# Patient Record
Sex: Male | Born: 1950
Health system: Southern US, Community
[De-identification: ages and names within clinical notes are randomized; demographics above are authoritative.]

## PROBLEM LIST (undated history)

## (undated) DIAGNOSIS — Z9889 Other specified postprocedural states: Secondary | ICD-10-CM

## (undated) DIAGNOSIS — Z860100 Personal history of colon polyps, unspecified: Secondary | ICD-10-CM

## (undated) DIAGNOSIS — M1990 Unspecified osteoarthritis, unspecified site: Secondary | ICD-10-CM

## (undated) DIAGNOSIS — R55 Syncope and collapse: Secondary | ICD-10-CM

## (undated) DIAGNOSIS — I739 Peripheral vascular disease, unspecified: Secondary | ICD-10-CM

## (undated) DIAGNOSIS — E119 Type 2 diabetes mellitus without complications: Secondary | ICD-10-CM

## (undated) DIAGNOSIS — M109 Gout, unspecified: Secondary | ICD-10-CM

## (undated) DIAGNOSIS — I10 Essential (primary) hypertension: Secondary | ICD-10-CM

## (undated) DIAGNOSIS — R51 Headache: Secondary | ICD-10-CM

## (undated) DIAGNOSIS — N529 Male erectile dysfunction, unspecified: Secondary | ICD-10-CM

## (undated) DIAGNOSIS — T82898A Other specified complication of vascular prosthetic devices, implants and grafts, initial encounter: Secondary | ICD-10-CM

## (undated) DIAGNOSIS — E782 Mixed hyperlipidemia: Secondary | ICD-10-CM

## (undated) DIAGNOSIS — R319 Hematuria, unspecified: Secondary | ICD-10-CM

## (undated) DIAGNOSIS — I350 Nonrheumatic aortic (valve) stenosis: Secondary | ICD-10-CM

## (undated) DIAGNOSIS — R112 Nausea with vomiting, unspecified: Secondary | ICD-10-CM

## (undated) DIAGNOSIS — I2510 Atherosclerotic heart disease of native coronary artery without angina pectoris: Secondary | ICD-10-CM

## (undated) DIAGNOSIS — F329 Major depressive disorder, single episode, unspecified: Secondary | ICD-10-CM

## (undated) DIAGNOSIS — E669 Obesity, unspecified: Secondary | ICD-10-CM

## (undated) DIAGNOSIS — E785 Hyperlipidemia, unspecified: Secondary | ICD-10-CM

## (undated) DIAGNOSIS — K219 Gastro-esophageal reflux disease without esophagitis: Secondary | ICD-10-CM

## (undated) DIAGNOSIS — G43909 Migraine, unspecified, not intractable, without status migrainosus: Secondary | ICD-10-CM

## (undated) DIAGNOSIS — F32A Depression, unspecified: Secondary | ICD-10-CM

## (undated) DIAGNOSIS — Z8669 Personal history of other diseases of the nervous system and sense organs: Secondary | ICD-10-CM

## (undated) DIAGNOSIS — K760 Fatty (change of) liver, not elsewhere classified: Secondary | ICD-10-CM

## (undated) DIAGNOSIS — R011 Cardiac murmur, unspecified: Secondary | ICD-10-CM

## (undated) HISTORY — DX: Fatty (change of) liver, not elsewhere classified: K76.0

## (undated) HISTORY — DX: Depression, unspecified: F32.A

## (undated) HISTORY — DX: Obesity, unspecified: E66.9

## (undated) HISTORY — DX: Cardiac murmur, unspecified: R01.1

## (undated) HISTORY — PX: CARDIAC CATHETERIZATION: SHX172

## (undated) HISTORY — DX: Peripheral vascular disease, unspecified: I73.9

## (undated) HISTORY — DX: Nonrheumatic aortic (valve) stenosis: I35.0

## (undated) HISTORY — PX: US ECHOCARDIOGRAPHY: HXRAD669

## (undated) HISTORY — DX: Essential (primary) hypertension: I10

## (undated) HISTORY — DX: Male erectile dysfunction, unspecified: N52.9

## (undated) HISTORY — DX: Hematuria, unspecified: R31.9

## (undated) HISTORY — DX: Syncope and collapse: R55

## (undated) HISTORY — PX: FEMORAL ARTERY - POPLITEAL ARTERY BYPASS GRAFT: SUR180

## (undated) HISTORY — PX: ROTATOR CUFF REPAIR: SHX139

## (undated) HISTORY — DX: Personal history of colon polyps, unspecified: Z86.0100

## (undated) HISTORY — DX: Migraine, unspecified, not intractable, without status migrainosus: G43.909

## (undated) HISTORY — DX: Personal history of colonic polyps: Z86.010

## (undated) HISTORY — DX: Gout, unspecified: M10.9

## (undated) HISTORY — DX: Personal history of other diseases of the nervous system and sense organs: Z86.69

## (undated) HISTORY — DX: Hyperlipidemia, unspecified: E78.5

## (undated) HISTORY — DX: Major depressive disorder, single episode, unspecified: F32.9

## (undated) HISTORY — DX: Mixed hyperlipidemia: E78.2

## (undated) HISTORY — PX: MRI: SHX5353

## (undated) HISTORY — DX: Other specified complication of vascular prosthetic devices, implants and grafts, initial encounter: T82.898A

## (undated) HISTORY — DX: Type 2 diabetes mellitus without complications: E11.9

## (undated) HISTORY — PX: KNEE ARTHROSCOPY: SUR90

---

## 1995-08-01 DIAGNOSIS — T82898A Other specified complication of vascular prosthetic devices, implants and grafts, initial encounter: Secondary | ICD-10-CM

## 1995-08-01 HISTORY — DX: Other specified complication of vascular prosthetic devices, implants and grafts, initial encounter: T82.898A

## 1999-01-04 ENCOUNTER — Encounter: Payer: Self-pay | Admitting: Family Medicine

## 1999-01-04 ENCOUNTER — Ambulatory Visit (HOSPITAL_COMMUNITY): Admission: RE | Admit: 1999-01-04 | Discharge: 1999-01-04 | Payer: Self-pay | Admitting: Family Medicine

## 2002-03-19 ENCOUNTER — Encounter: Payer: Self-pay | Admitting: *Deleted

## 2002-03-19 ENCOUNTER — Ambulatory Visit (HOSPITAL_COMMUNITY): Admission: RE | Admit: 2002-03-19 | Discharge: 2002-03-19 | Payer: Self-pay | Admitting: *Deleted

## 2005-01-03 ENCOUNTER — Encounter (INDEPENDENT_AMBULATORY_CARE_PROVIDER_SITE_OTHER): Payer: Self-pay | Admitting: *Deleted

## 2005-01-03 ENCOUNTER — Ambulatory Visit (HOSPITAL_COMMUNITY): Admission: RE | Admit: 2005-01-03 | Discharge: 2005-01-03 | Payer: Self-pay | Admitting: *Deleted

## 2006-11-15 ENCOUNTER — Emergency Department (HOSPITAL_COMMUNITY): Admission: EM | Admit: 2006-11-15 | Discharge: 2006-11-16 | Payer: Self-pay | Admitting: Emergency Medicine

## 2006-11-18 ENCOUNTER — Ambulatory Visit (HOSPITAL_COMMUNITY): Admission: EM | Admit: 2006-11-18 | Discharge: 2006-11-18 | Payer: Self-pay | Admitting: *Deleted

## 2007-05-09 ENCOUNTER — Ambulatory Visit: Payer: Self-pay | Admitting: Vascular Surgery

## 2007-05-27 ENCOUNTER — Ambulatory Visit: Payer: Self-pay | Admitting: Surgery

## 2007-08-01 DIAGNOSIS — I350 Nonrheumatic aortic (valve) stenosis: Secondary | ICD-10-CM

## 2007-08-01 HISTORY — DX: Nonrheumatic aortic (valve) stenosis: I35.0

## 2007-08-05 ENCOUNTER — Ambulatory Visit: Payer: Self-pay | Admitting: Vascular Surgery

## 2008-07-06 ENCOUNTER — Inpatient Hospital Stay (HOSPITAL_COMMUNITY): Admission: EM | Admit: 2008-07-06 | Discharge: 2008-07-08 | Payer: Self-pay | Admitting: Emergency Medicine

## 2008-07-07 ENCOUNTER — Encounter (INDEPENDENT_AMBULATORY_CARE_PROVIDER_SITE_OTHER): Payer: Self-pay | Admitting: Internal Medicine

## 2008-07-08 ENCOUNTER — Other Ambulatory Visit: Payer: Self-pay | Admitting: Interventional Cardiology

## 2008-08-04 ENCOUNTER — Ambulatory Visit: Payer: Self-pay | Admitting: Vascular Surgery

## 2010-03-25 ENCOUNTER — Encounter: Admission: RE | Admit: 2010-03-25 | Discharge: 2010-03-25 | Payer: Self-pay | Admitting: Family Medicine

## 2010-12-13 NOTE — Assessment & Plan Note (Signed)
OFFICE VISIT   RONDELL, PARDON E  DOB:  [birth year]                                       05/27/2007  CHART#:[id number]   REASON FOR VISIT:  Follow up ultrasound.   HISTORY:  This is now a 60 year old gentleman who is status post right  superficial femoral artery to below-knee popliteal bypass with reverse  greater saphenous vein secondary to right popliteal occlusion and severe  claudication.  This was performed on January 16, 1996.  The patient on  October 9, underwent duplex evaluation which revealed a decrease in his  ankle brachial indices to 0.58.  This also shows an occluded bypass  graft.  The patient reports that he cannot specifically pinpoint a time  where he began having difficulty.  However, a few months ago while at  the beach he fell and has had trouble since.  He states that he can walk  approximately 1/4 to a 1/2 mile before he has cramps.  He then rests and  the cramps go away and he is able to continue walking.  He denies having  any rest pain.  He does not have any open ulcers.   PAST MEDICAL HISTORY:  Significant for:  1. High blood pressure.  2. Hypercholesterolemia.   FAMILY HISTORY:  Negative for cardiovascular disease.   SOCIAL HISTORY:  He is married.  He does not smoke.  He has never  smoked.  He does not drink.   REVIEW OF SYSTEMS:  GENERAL:  Negative for weight loss or weight gain.  CARDIAC:  Negative for chest pain.  Positive for heart murmur as a young  person.  PULMONARY:  Negative for cough or bronchitis.  GI:  Negative.  GU:  Negative.  NEURO:  Negative.  PSYCHIATRIC:  Negative.  ENT:  Negative.  HEME:  Negative.   MEDICATIONS:  Include:  1. Allopurinol 100 mg once daily.  2. Prevacid 30 mg as needed.  3. Colchicine 0.6 mg two times per day.  4. Benazepril 20 mg once daily.   PHYSICAL EXAMINATION:  Blood pressure is 160/98, pulse 60, respirations  16.  Generally:  He is well-appearing in no acute distress.  Cardiovascular:  Regular.  Lungs are clear.  Respirations are non-  labored.  Abdomen is soft.  Extremities:  The right leg is cool to  touch.  There are no palpable pulses.  There is no ulceration.  The  patient has positive sensation between his first and second web spaces.  He is able to dorsi-flex his toe.   DIAGNOSTIC STUDIES:  Duplex exam:  The patient has an occluded bypass  graft.  Ankle brachial indices are 0.58 on the right.   ASSESSMENT/PLAN:  Status post right superficial femoral artery to  popliteal bypass graft which is now occluded.   PLAN:  At this point in time the patient is not having any rest pain and  his claudication occurs at approximately 1/4 to 1/2 mile.  We discussed  options of proceeding with an arteriogram versus observation.  At this  time based on his symptoms I would probably delay performing a second  bypass and have him schedule to follow up with Quita Skye. Hart Rochester, M.D. in  three months for reevaluation.  The patient will contact us if he has  worsening claudication or rest pain or develops any non-healing wounds.  Jorge Ny, MD  Electronically Signed   VWB/MEDQ  D:  05/27/2007  T:  05/28/2007  Job:  [id number]

## 2010-12-13 NOTE — Discharge Summary (Signed)
[name], [name]              ACCOUNT NO.:  [id number]   MEDICAL RECORD NO.:  [id number]          PATIENT TYPE:  INP   LOCATION:  1407                         FACILITY:  [facility]   PHYSICIAN:  Corinna L. Lendell Caprice, MDDATE OF BIRTH:  Jul 03, 1951   DATE OF ADMISSION:  07/06/2008  DATE OF DISCHARGE:  07/08/2008                               DISCHARGE SUMMARY   DISCHARGE DIAGNOSES:  1. Dyspnea.  2. Hyperlipidemia.  3. Hypertension.  4. Moderate aortic stenosis.  5. Mildly decreased left ventricular function.  6. Nonobstructive coronary artery disease.   DISCHARGE MEDICATIONS:  1. Imdur 30 mg a day.  2. Zocor 20 mg a day.  3. Enalapril 5 mg a day.  4. Aspirin 81 mg a day.  5. He may continue his Cymbalta 60 mg a day and Prevacid OTC as      needed.   CONDITION:  Stable.   ACTIVITY:  He may return to work next week.   CONSULTATIONS:  [facility].   PROCEDURES:  Cardiac catheterization.   DIET:  Low-salt, low-cholesterol.   LABORATORY DATA:  CBC unremarkable.  PT/PTT normal.  D-dimer 0.55.  Basic metabolic panel unremarkable.  Liver function tests significant  for a total bilirubin of 1.4, otherwise unremarkable.  Hemoglobin A1c is  6.2.  Total CPK is 568.  Troponin 0.02.  BNP 42.  Cholesterol 280,  triglycerides 155, HDL 35, LDL 214, VLDL 31.  TSH 1.243.   SPECIAL STUDIES/RADIOLOGY:  EKG showed sinus bradycardia with a rate of  58, inferolaterally flipped T-wave which is new from previous.  Chest x-  ray shows nothing acute.  CT angiogram of the chest showed no pulmonary  embolus, interlobular septal thickening and scattered areas of ground-  glass attenuation most consistent with volume overload and congestive  heart failure possibly related to aortic valvular disease, tiny  bilateral pleural effusions.  Followup chest CT recommended in 3-6  months, fatty liver.  Follow up with Dr. Eldridge Dace in 1-2 weeks.  Follow  up with Holley Bouche in 2-3 weeks.  He will need his  blood pressure  monitored, repeat LFTs, statin therapy and blood pressure check.  He  will need a repeat CAT scan in 3-6 months to follow up on the results.   HISTORY AND HOSPITAL COURSE:  [name] is a 60 year old white male  patient of Dr. Tiburcio Pea who presented with worsening dyspnea on exertion.  He had flipped T-waves inferolaterally.  CAT scan showed above.  No PE.  The patient was given Lasix.  His cardiac enzymes were cycled.  He had  negative troponins.  Cardiology was consulted and performed cardiac  catheterization.  Please see report.  He had been on blood pressure and  lipid lowering medications previously but took himself off this.  Dr.  Eldridge Dace recommended ACE inhibitor, statin and Imdur.  I also  recommended aspirin a day.  If he continues to have dyspnea on exertion,  consider pulmonary workup.      Corinna L. Lendell Caprice, MD  Electronically Signed     CLS/MEDQ  D:  07/08/2008  T:  07/09/2008  Job:  [id number]   cc:   Corky Crafts, MD  Holley Bouche, M.D.

## 2010-12-13 NOTE — H&P (Signed)
[name], [name]              ACCOUNT NO.:  [id number]   MEDICAL RECORD NO.:  [id number]          PATIENT TYPE:  INP   LOCATION:  1407                         FACILITY:  [facility]   PHYSICIAN:  Michiel Cowboy, MDDATE OF BIRTH:  1950/10/25   DATE OF ADMISSION:  07/06/2008  DATE OF DISCHARGE:                              HISTORY & PHYSICAL   PRIMARY CARE Cristie Mckinney:  Dr. Tiburcio Pea.   CHIEF COMPLAINT:  Shortness of breath.   Patient is a 60 year old gentleman who stopped taking his blood pressure  medications and over the past one week has been complaining of  progressive shortness of breath when he does chores.  Particularly on  December 7 in the morning, when he was trying to take the garbage out,  once he stopped, the chest discomfort and shortness of breath goes away.  It is purely exertional and does not occur at rest but has been going on  for 4 weeks now.  Eventually, he let his wife know, and she brought him  to the emergency department, where he was noted to have EKG with T wave  inversion, so no old EKG was available, at which point, he was admitted  for further investigation.  [facility] were called for  admission.   REVIEW OF SYSTEMS:  No fevers, no chills, no nausea, no vomiting, no  diaphoresis, no constipation, no diarrhea.  The rest of the review of  systems are unremarkable.   PAST MEDICAL HISTORY:  Significant for hyperlipidemia and hypertension  and depression.  He actually stopped his hypertension and hyperlipidemia  medications.  He himself has also a past medical history of peripheral  vascular disease.   FAMILY HISTORY:  He has a history of peripheral vascular disease in his  mother.   SOCIAL HISTORY:  Patient never smoked.  He does not drink alcohol.  He  lives at home with his wife.   ALLERGIES:  No known drug allergies.   MEDICATIONS:  Prevacid.  Cymbalta.  He stopped his hypertension  medications.   PHYSICAL EXAMINATION:  VITALS:   Temperature 97.7, blood pressure 158/90,  respirations 18, satting at 98% on room air.  Heart rate 77.  Patient is alert and oriented.  No acute distress.  Appears older than  stated age.  Head nontraumatic.  Moist mucous membranes.  LUNGS:  Clear to auscultation bilaterally.  Somewhat distant breath  sounds, although occasional mild crackles at the bases.  HEART:  Regular rate and rhythm with no murmurs, rubs or gallops  appreciated.  ABDOMEN:  Soft, nontender, nondistended.  Slightly obese.  LOWER EXTREMITIES:  No clubbing, cyanosis or edema.  Cranial nerves II-XII are intact.  Strength is 5/5 in all 4 extremities.   LABS:  White blood cell count 8.6, hemoglobin 15.5.  D-dimer 0.55.  Sodium 141, potassium 3.5, creatinine 0.9.  CK 568.  MB 6.6 with index  of 1.8.  Troponin 0.02.  BNP 42.   EKG showing T wave inversion to leads I, II, V4-6.   Chest x-ray showing no cardiopulmonary disease.   CT of the chest shows no PE and shows  slight congestion.   ASSESSMENT/PLAN:  1. This is a 60 year old gentleman with a history of worsening      shortness of breath over the past week worrisome for angina.  We      will admit for further observation.  Cycle cardiac enzymes.      Discussed with cardiology, who will see him in the a.m. Will order      a 2D echo, given abnormal CT scans worrisome for infectious emboli.      Will give gentle Lasix, proBNP  and orthostatics.  D-dimer was      slightly elevated, but CT scan did not show pulmonary embolus.  2. Low potassium:  Will replace.  3. History of hypertension:  Unsure which medication he is supposed to      be on but will start him on metoprolol and titrate up.  4. History of hyperlipidemia:  Will obtain fasting lipid panel and      will likely need to start him on a statin.  Admit to telemetry.  5. Prophylaxis:  Protonix plus Lovenox.      Michiel Cowboy, MD  Electronically Signed     AVD/MEDQ  D:  07/07/2008  T:  07/07/2008   Job:  [id number]   cc:   Dr. Tiburcio Pea

## 2010-12-13 NOTE — Cardiovascular Report (Signed)
[name], [name]              ACCOUNT NO.:  [id number]   MEDICAL RECORD NO.:  [id number]          PATIENT TYPE:  INP   LOCATION:  3736                         FACILITY:  [facility]   PHYSICIAN:  Corky Crafts, MDDATE OF BIRTH:  Jun 25, 1951   DATE OF PROCEDURE:  07/08/2008  DATE OF DISCHARGE:  07/08/2008                            CARDIAC CATHETERIZATION   REFERRING PHYSICIAN:  Holley Bouche, MD   PROCEDURES PERFORMED:  1. Left heart catheterization.  2. Right heart catheterization.  3. Left ventriculogram.  4. Coronary angiogram.  5. Abdominal aortogram.   OPERATOR:  Corky Crafts, MD   INDICATIONS:  Shortness of breath, aortic stenosis.   PROCEDURE NARRATIVE:  The risks and benefits of cardiac catheterization  were explained to the patient and informed consent was obtained.  He was  brought to the Cath Lab and placed on the table.  He was prepped and  draped in the usual sterile fashion.  His left groin was infiltrated  with 1% lidocaine.  A 6-French sheath was placed into the left femoral  artery using a modified Seldinger technique.  Subsequently, a 7-French  sheath was placed into the left femoral vein using a modified Seldinger  technique.  A Swan-Ganz catheter was advanced to the pulmonary artery  under fluoroscopic guidance.  Hemodynamic measurements and oxygen  saturations were measured at that point.  A pigtail catheter was placed  into the ascending aorta and across the valve under fluoroscopic  guidance with some difficulty.  A straight wire had to be used with a  pigtail catheter to finally cross the aortic valve.  The PA catheter was  removed, and pressures were obtained in the RV and right atrium.  A  power injection of contrast was performed using a pigtail catheter to  image the left ventricle.  A pullback was performed under continuous  hemodynamic pressure monitoring.  Catheter was then pulled back to the  abdominal aorta, and a power injection  of contrast was performed to  image the infrarenal aorta.  A JL-4.0 catheter was then advanced to the  ascending aorta into the left main coronary artery.  Digital angiography  was performed in multiple projections using hand injection of contrast.  A JR-4 catheter was then used to engage the ostium of the right coronary  artery under fluoroscopic guidance.  Digital angiography was performed  in multiple projections using hand injection of contrast.  The sheaths  will be removed using manual compression.   FINDINGS:  Right heart cath.  Right atrial pressure was 6/4 mmHg with a  mean right atrial pressure of 3.  RV pressure was 31/3 with an RVEDP of  5 mmHg.  Pulmonary artery saturation was 63%.  Cardiac output calculated  by Fick was 5.5 with a cardiac index of 2.5.  Left ventricular pressure  was 134/7 with an LVEDP of 16 mmHg.  Aortic pressure of 102/68 with a  mean aortic pressure of 83 mmHg.  The mean gradient was 22 mmHg.  The  calculated aortic valve area is 1.45 sq. cm.  The estimated left  ventricular ejection fraction from the  ventriculogram was 40-45%.  The  abdominal aortogram showed no abdominal aortic aneurysm.  There are  bilateral single renal arteries, which were widely patent.  The left  main was widely patent.  The left circumflex was initially a medium-  sized vessel, which appeared to have a significant lesion in the  proximal segment.  After intracoronary nitroglycerin was given, the size  of the circumflex increased dramatically.  The proximal ostial stenosis  appeared to be about 50-60%.  There is an OM-1, which was small.  The OM-  2 was large and had mild irregularities.   The left anterior descending was a large vessel, which reached the apex.  There was a large first diagonal, which was widely patent.   The right coronary artery was a large dominant vessel with moderate  diffuse atherosclerosis.  There was calcium noted distally.  There was a  dual PDA, both  of which were widely patent.   IMPRESSION:  1. Moderate aortic stenosis with an aortic valve area of 1.45 sq. cm      calculated by the Gorlin formula.  2. Mildly decreased left ventricular function by ventriculogram with      an EF of 40-45%.  3. No abdominal aortic aneurysm.  4. No pulmonary hypertension with pulmonary artery pressure of 22/8,      with a mean PA of 14.  Pulmonary capillary wedge pressure was 9/7      with a mean wedge of 7 mmHg.  5. Mild to moderate copronary artery disease requiring no      revascularization at this time.   RECOMMENDATIONS:  Continue aggressive blood pressure control.  We will  start an ACE inhibitor and statins given his moderate atherosclerosis as  well as aortic stenosis.  The patient will also need aggressive  lifestyle modifications effecting his diet and exercise.      Corky Crafts, MD  Electronically Signed     JSV/MEDQ  D:  07/08/2008  T:  07/08/2008  Job:  [id number]

## 2010-12-13 NOTE — Procedures (Signed)
BYPASS GRAFT EVALUATION   INDICATION:  Follow-up evaluation of right leg bypass graft.  Recent  onset, sharp pain in right knee and right calf claudication within the  last month.   HISTORY:  Diabetes:  No.  Cardiac:  No.  Hypertension:  Yes.  Smoking:  No.  Previous Surgery:  Right superficial femoral to popliteal artery bypass  graft on January 16, 1996 by Dr. Hart Rochester.   SINGLE LEVEL ARTERIAL EXAM                               RIGHT              LEFT  Brachial:                    120                126  Anterior tibial:             56                 80  Posterior tibial:            74                 126  Peroneal:  Ankle/brachial index:        0.58               1.0   PREVIOUS ABI:  Date: 07/22/1997  RIGHT:  1.0  LEFT:  >1.0   LOWER EXTREMITY BYPASS GRAFT DUPLEX EXAM:   DUPLEX:  Doppler arterial waveforms are triphasic in the proximal to mid  right superficial femoral artery.  No flow is detected within the right  superficial femoral artery to distal popliteal artery graft.  Large  collateral vessels are seen proximal to the occluded superficial femoral  to popliteal artery graft, originating off of the superficial femoral  artery.   IMPRESSION:  1. Right ankle brachial indices lower than previously recorded.  2. Left ankle brachial index is stablefrom previous study.  3. Occluded right superficial femoral to distal popliteal artery      bypass graft.   ___________________________________________  Quita Skye. Hart Rochester, M.D.   MC/MEDQ  D:  05/09/2007  T:  05/10/2007  Job:  [id number]

## 2010-12-13 NOTE — Assessment & Plan Note (Signed)
OFFICE VISIT   [name], [name]  DOB:  July 13, 1951                                       08/05/2007  CHART#:[id number]   The patient returns today having been seen by Dr. Myra Gianotti a few months  ago and found to have an occluded right superficial femoral to popliteal  bypass graft, which I inserted in 1997.  It sounds as if the graft  occluded last summer in 2008.  He currently has stable calf claudication  symptoms.  He is able to ambulate 2.8 miles, at which point he stops  walking, not out of necessity.  He does eventually develop numbness in  toes 3 through 5 on the lateral aspect of the foot, and his symptoms  begin at about 1 mile.  He is able to perform his daily activities at  the present time.  He has no symptoms in the left leg.  He denies any  hemispheric anomalies, hemispheric TIAs, amaurosis fugax, diplopia,  blurred vision, or syncope, and also denies any chest pain, dyspnea on  exertion, PND, orthopnea.   EXAM:  Blood pressure 132/90, heart rate 80, respirations 14.  In  general, he is a healthy-appearing middle-aged male in no apparent  distress.  Carotid pulses 3+.  No audible bruits.  Chest is clear to  auscultation.  Abdomen is soft and nontender with no palpable masses.  Right leg has a 3+ femoral with absent popliteal and distal pulses.  Left leg has 3+ femoral, popliteal, or dorsalis pedis and posterior  tibial pulses.  ABI today is 0.67 on the right and 0.99 on the left.   I have encouraged him to continue to increase his activity as tolerated  and, hopefully, his collaterals will improve.  Unless his symptoms  worsen, I do not think we will need to re-intervene, but we will see him  back in 1 year with followup ABI at that time.   Quita Skye Hart Rochester, M.D.  Electronically Signed   JDL/MEDQ  D:  08/05/2007  T:  08/06/2007  Job:  [id number]

## 2010-12-13 NOTE — Assessment & Plan Note (Signed)
OFFICE VISIT   [name], [name]  DOB:  1950-10-07                                       08/04/2008  CHART#:[id number]   The patient returns today for continued followup regarding his lower  extremity occlusive disease.  He had previously undergone a right  superficial femoral to popliteal bypass graft by me in 1997 which  occluded about a year or so ago.  His circulation has been stable since  that time.  He develops calf claudication at about 1/2 mile in the right  calf only but is able to continue and walks up to 2-1/2 miles at a time.  His foot becomes numb but he is able to continue.  He has had no rest  pain or nonhealing ulcers or infection.  He has no symptoms in the  contralateral left leg.  Denies any neurologic symptoms such as  hemiparesis, aphasia, amaurosis fugax, diplopia, blurred vision, or  syncope.  He did have some shortness of breath a few months ago and  underwent cardiac catheterization by [facility] and was not found  to have any significant coronary artery disease.  He takes 1 aspirin per  day.   PHYSICAL EXAM:  Blood pressure is 125/83.  Heart rate is 80.  Respirations 14.  Carotid pulses 3+, no audible bruits.  Neurologic  Exam:  Normal.  Chest:  Clear to auscultation.  Abdomen:  Soft,  nontender with no masses.  He has 3+ femoral, popliteal, and posterior  tibial pulses on the left and 3+ femoral pulse on the right.   ABI is 0.67 on the right and 0.95 on the left, unchanged from January of  2009.   I reassured him regarding these findings.  He will contact us if the  symptoms worsen but otherwise will not need to be followed on a regular  basis as he is well aware of symptoms of progressive ischemia.   Quita Skye Hart Rochester, M.D.  Electronically Signed   JDL/MEDQ  D:  08/04/2008  T:  08/05/2008  Job:  [id number]   cc:   Holley Bouche, M.D.

## 2010-12-16 NOTE — Consult Note (Signed)
[name], [name]              ACCOUNT NO.:  [id number]   MEDICAL RECORD NO.:  [id number]          PATIENT TYPE:  EMS   LOCATION:  ED                           FACILITY:  [facility]   PHYSICIAN:  Lindaann Slough, M.D.  DATE OF BIRTH:  [birth year]   DATE OF CONSULTATION:  11/18/2006  DATE OF DISCHARGE:                                 CONSULTATION   REASON FOR CONSULTATION:  Left flank pain.   HISTORY OF PRESENT ILLNESS:  The patient is a 60 year old male who was  seen in the emergency room on Thursday for severe, left-sided abdominal  pain associated with nausea and vomiting.  The pain started Wednesday  night.  It went away and then recurred Thursday morning, but by Thursday  evening, the pain was severe and the patient came to the emergency room.  CT scan showed a 5 mm stone in the left mid ureter with moderate  hydronephrosis and a nonobstructing punctate stone in the right kidney.  He also has a 4.3 cm cyst in the left kidney.  The patient was then  treated with analgesics and went home, but he has been having pain on  and off.  The pain recurred this morning and was severe.  He returned to  the emergency room.  He continues to have nausea.  The pain is also  associated with frequency, hesitancy and decreased stream.  He denies  gross hematuria.  He has no previous history of kidney stone.   PAST MEDICAL HISTORY:  1. Hypertension.  2. Hypercholesterolemia.  3. Acid reflux.   MEDICATIONS:  1. Crestor.  2. Lotensin.  3. Pepcid p.r.n.  4. Cymbalta.   SOCIAL HISTORY:  He is married and has four children.  His oldest  daughter has history of kidney stone.   PAST SURGICAL HISTORY:  1. Right knee surgery 10 years ago.  2. Femoral-popliteal bypass on the right side.  3. Left shoulder surgery 2 years ago.  4. Vasectomy.   REVIEW OF SYSTEMS:  Positive for nausea, vomiting, left-sided abdominal  pain, frequency, hesitancy, straining on urination, decreased stream and  everything else is negative.   PHYSICAL EXAMINATION:  GENERAL:  This is a well-developed, 60 year old  male who is complaining of left-sided abdominal pain.  VITAL SIGNS:  Blood pressure is 121/74, pulse 72, respirations 20 and  temperature 98.5.  It was 100 earlier today.  HEENT:  His head is normal.  He has pink conjunctivae.  Ears, nose and  throat are within normal limits.  NECK:  Supple.  He has no cervical adenopathy.  No thyromegaly.  LUNGS:  Clear.  HEART:  Regular rhythm.  ABDOMEN:  Somewhat protuberant.  It is tender in the left flank and left  lower quadrant.  He has no CVA tenderness.  Kidneys are not palpable.  Bladder is not distended.  He has no inguinal hernia.  There is no  hepatomegaly and no splenomegaly.  There is a well-healed scar in the  right upper thigh and there is no inguinal adenopathy.  GENITALIA:  Penis is circumcised.  Meatus is normal.  Scrotum is normal.  He has no hydrocele.  No testicular mass.  Cords and epididymis are  within normal limits.   LABORATORY DATA AND X-RAY FINDINGS:  Urinalysis shows 21-50 rbc's, 0-2  wbc's and a few bacteria.  BUN is 13, creatinine 1.49. sodium 137,  potassium 3.8.  Hemoglobin is 15, hematocrit 43.5, WBC 12.6.   I independently reviewed the CT scan and there is a 5 mm stone in the  left mid ureter with moderate hydronephrosis and a small stone in the  right kidney.   IMPRESSION:  1. Left ureteral calculus.  2. Left renal cyst.  3. Right renal calculus.   RECOMMENDATIONS:  I discussed with the patient and his wife the  treatment options.  I have told them since he had to return to emergency  room in 3 days for pain, it would be preferable at this time to proceed  with double-J stent and if possible stone extraction.  The procedure,  the risks and benefits have been discussed with the patient and his  wife.  They both understand and are agreeable.      Lindaann Slough, M.D.  Electronically Signed      MN/MEDQ  D:  11/18/2006  T:  11/19/2006  Job:  [id number]

## 2010-12-16 NOTE — Op Note (Signed)
[name], [name]              ACCOUNT NO.:  [id number]   MEDICAL RECORD NO.:  [id number]          PATIENT TYPE:  EMS   LOCATION:  ED                           FACILITY:  [facility]   PHYSICIAN:  Lindaann Slough, M.D.  DATE OF BIRTH:  05/04/1951   DATE OF PROCEDURE:  11/18/2006  DATE OF DISCHARGE:                               OPERATIVE REPORT   PREOPERATIVE DIAGNOSIS:  Left ureteral calculus.   POSTOP DIAGNOSIS:  Left ureteral calculus.   PROCEDURE DONE:  1. Cystoscopy.  2. Left retrograde pyelogram.  3. Ureteroscopy.  4. Holmium laser left ureteral stone extraction and insertion of      double-J catheter.   SURGEON:  Dr. Brunilda Payor .   ANESTHESIA:  General.   INDICATION:  The patient is 60 year old male who came to the emergency  room on Thursday night with sudden onset of severe left flank pain.  A  CT scan showed a 5-mm stone in the mid-left ureter with moderate  hydronephrosis.  He was discharged home on oral analgesics.  He has been  having pain on-and-off since.  The pain became severe this morning; and  he returned to the emergency room.  He is now scheduled for cystoscopy  and stone manipulation.  The risks, benefits of the procedure have been  discussed with the patient and his wife.  The risks include but are not  limited to hemorrhage, infection, ureteral injury with need to do open  procedure.  They undrestand and gave informed consent.   Under general anesthesia the patient was prepped and draped and placed  in the dorsal lithotomy position.  A #22 Wappler cystoscope was inserted  in the bladder.  The anterior urethra is normal.  He has moderate  prostatic hypertrophy.  The bladder is normal.  There is no stone or  tumor in the bladder.  The ureteral orifices are in normal position and  shape.   RETROGRADE PYELOGRAM:  A cone-tip catheter was passed through the  cystoscope into the left ureteral orifice. Contrast was then injected  through cone-tip catheter.  The  distal ureter is normal.  There is a  filling defect at the level of the iliac crest.  The proximal ureter,  renal pelvis, and calyces are moderately dilated.  The cone-tip catheter  was then removed.  A sensor tip guide wire was then passed through the  cystoscope.  The sensor tip guidewire was then advanced all the way up  into the renal pelvis.   The cystoscope was removed.  A #6-French semi-rigid ureteroscope was  then passed in the bladder, but could not be advanced beyond the  intramural ureter.  The ureteroscope was then removed.  The intramural  ureter was then dilated with the inner sheath of the ureteroscope access  sheath.  The ureteroscope access sheath was then removed.  The  ureteroscope was then inserted in the bladder, and advanced in the  distal ureter, but could not be advanced all the way up to the mid  ureter.  The ureteroscope was, again, removed.  A long ureteroscope  access sheath was then passed over  the guidewire and advanced up to the  mid ureter; and it was then removed.  The ureteroscope was then passed  in the bladder and the ureter without difficulty.  The stone was then  visualized in the mid ureter.  With a 365 micro fiber holmium laser the  stone was fragmented in multiple small stone fragments.  Then the stone  fragments were removed with the nitinol stone basket.  The ureteroscope  was then removed.  The guidewire was then back loaded into the  cystoscope; and a #6 French-26 double-J catheter was passed over the  guidewire.  The proximal curl of the double-J catheter is in the renal  pelvis.  The distal curl is in the bladder.  The string was then left  attached to the double-J catheter.  The bladder was then emptied and the  cystoscope and guidewire were removed.   The patient tolerated the procedure well and left the OR in satisfactory  condition to post anesthesia care unit.      Lindaann Slough, M.D.  Electronically Signed     MN/MEDQ  D:   11/18/2006  T:  11/19/2006  Job:  [id number]

## 2011-05-05 LAB — POCT I-STAT 3, ART BLOOD GAS (G3+)
Acid-Base Excess: 1 mmol/L (ref 0.0–2.0)
Bicarbonate: 25.2 mEq/L — ABNORMAL HIGH (ref 20.0–24.0)
Bicarbonate: 26.5 mEq/L — ABNORMAL HIGH (ref 20.0–24.0)
O2 Saturation: 65 %
O2 Saturation: 87 %
TCO2: 26 mmol/L (ref 0–100)
TCO2: 28 mmol/L (ref 0–100)
pCO2 arterial: 41.2 mmHg (ref 35.0–45.0)
pCO2 arterial: 44.6 mmHg (ref 35.0–45.0)
pH, Arterial: 7.383 (ref 7.350–7.450)
pH, Arterial: 7.395 (ref 7.350–7.450)
pO2, Arterial: 34 mmHg — CL (ref 80.0–100.0)
pO2, Arterial: 54 mmHg — ABNORMAL LOW (ref 80.0–100.0)

## 2011-05-05 LAB — BASIC METABOLIC PANEL
BUN: 15 mg/dL (ref 6–23)
BUN: 17 mg/dL (ref 6–23)
CO2: 23 mEq/L (ref 19–32)
CO2: 29 mEq/L (ref 19–32)
Calcium: 9.2 mg/dL (ref 8.4–10.5)
Calcium: 9.4 mg/dL (ref 8.4–10.5)
Chloride: 104 mEq/L (ref 96–112)
Chloride: 108 mEq/L (ref 96–112)
Creatinine, Ser: 0.99 mg/dL (ref 0.4–1.5)
Creatinine, Ser: 1.22 mg/dL (ref 0.4–1.5)
GFR calc non Af Amer: >60 mL/min (ref >60)
GFR calc non Af Amer: >60 mL/min (ref >60)
Glucose, Bld: 102 mg/dL — ABNORMAL HIGH (ref 70–99)
Glucose, Bld: 117 mg/dL — ABNORMAL HIGH (ref 70–99)
Potassium: 3.5 mEq/L (ref 3.5–5.1)
Potassium: 4 mEq/L (ref 3.5–5.1)
Sodium: 141 mEq/L (ref 135–145)
Sodium: 141 mEq/L (ref 135–145)

## 2011-05-05 LAB — POCT I-STAT 3, VENOUS BLOOD GAS (G3P V)
Bicarbonate: 26.1 mEq/L — ABNORMAL HIGH (ref 20.0–24.0)
O2 Saturation: 61 %
TCO2: 27 mmol/L (ref 0–100)
pCO2, Ven: 44.6 mmHg — ABNORMAL LOW (ref 45.0–50.0)
pH, Ven: 7.376 — ABNORMAL HIGH (ref 7.250–7.300)
pO2, Ven: 33 mmHg (ref 30.0–45.0)

## 2011-05-05 LAB — HEMOGLOBIN A1C: Mean Plasma Glucose: 131 mg/dL

## 2011-05-05 LAB — CARDIAC PANEL(CRET KIN+CKTOT+MB+TROPI)
CK, MB: 5.3 ng/mL — ABNORMAL HIGH (ref 0.3–4.0)
CK, MB: 6.4 ng/mL — ABNORMAL HIGH (ref 0.3–4.0)
Relative Index: 1.2 (ref 0.0–2.5)
Relative Index: 1.3 (ref 0.0–2.5)
Total CK: 442 U/L — ABNORMAL HIGH (ref 7–232)
Total CK: 512 U/L — ABNORMAL HIGH (ref 7–232)

## 2011-05-05 LAB — DIFFERENTIAL
Basophils Absolute: 0 10*3/uL (ref 0.0–0.1)
Basophils Absolute: 0 10*3/uL (ref 0.0–0.1)
Basophils Relative: 1 % (ref 0–1)
Basophils Relative: 1 % (ref 0–1)
Eosinophils Absolute: 0.1 10*3/uL (ref 0.0–0.7)
Eosinophils Absolute: 0.1 10*3/uL (ref 0.0–0.7)
Eosinophils Relative: 2 % (ref 0–5)
Eosinophils Relative: 2 % (ref 0–5)
Lymphocytes Relative: 27 % (ref 12–46)
Lymphocytes Relative: 36 % (ref 12–46)
Lymphs Abs: 2.1 10*3/uL (ref 0.7–4.0)
Lymphs Abs: 3.1 10*3/uL (ref 0.7–4.0)
Monocytes Absolute: 0.7 10*3/uL (ref 0.1–1.0)
Monocytes Absolute: 0.8 10*3/uL (ref 0.1–1.0)
Monocytes Relative: 10 % (ref 3–12)
Monocytes Relative: 9 % (ref 3–12)
Neutro Abs: 4.5 10*3/uL (ref 1.7–7.7)
Neutro Abs: 4.9 10*3/uL (ref 1.7–7.7)
Neutrophils Relative %: 52 % (ref 43–77)
Neutrophils Relative %: 62 % (ref 43–77)

## 2011-05-05 LAB — CBC
HCT: 46.5 % (ref 39.0–52.0)
HCT: 48.8 % (ref 39.0–52.0)
HCT: 50.2 % (ref 39.0–52.0)
Hemoglobin: 15.5 g/dL (ref 13.0–17.0)
Hemoglobin: 16.3 g/dL (ref 13.0–17.0)
Hemoglobin: 16.9 g/dL (ref 13.0–17.0)
MCHC: 33.3 g/dL (ref 30.0–36.0)
MCHC: 33.4 g/dL (ref 30.0–36.0)
MCHC: 33.6 g/dL (ref 30.0–36.0)
MCV: 91.1 fL (ref 78.0–100.0)
MCV: 91.3 fL (ref 78.0–100.0)
MCV: 91.7 fL (ref 78.0–100.0)
Platelets: 242 10*3/uL (ref 150–400)
Platelets: 255 10*3/uL (ref 150–400)
Platelets: 262 10*3/uL (ref 150–400)
RBC: 5.1 MIL/uL (ref 4.22–5.81)
RBC: 5.32 MIL/uL (ref 4.22–5.81)
RBC: 5.5 MIL/uL (ref 4.22–5.81)
RDW: 13 % (ref 11.5–15.5)
RDW: 13.2 % (ref 11.5–15.5)
RDW: 13.3 % (ref 11.5–15.5)
WBC: 7.6 10*3/uL (ref 4.0–10.5)
WBC: 7.9 10*3/uL (ref 4.0–10.5)
WBC: 8.6 10*3/uL (ref 4.0–10.5)

## 2011-05-05 LAB — COMPREHENSIVE METABOLIC PANEL
ALT: 36 U/L (ref 0–53)
AST: 33 U/L (ref 0–37)
Albumin: 4.2 g/dL (ref 3.5–5.2)
Alkaline Phosphatase: 61 U/L (ref 39–117)
BUN: 13 mg/dL (ref 6–23)
CO2: 28 mEq/L (ref 19–32)
Calcium: 9.4 mg/dL (ref 8.4–10.5)
Chloride: 104 mEq/L (ref 96–112)
Creatinine, Ser: 1.06 mg/dL (ref 0.4–1.5)
GFR calc non Af Amer: >60 mL/min (ref >60)
Glucose, Bld: 124 mg/dL — ABNORMAL HIGH (ref 70–99)
Potassium: 3.5 mEq/L (ref 3.5–5.1)
Sodium: 139 mEq/L (ref 135–145)
Total Bilirubin: 1.4 mg/dL — ABNORMAL HIGH (ref 0.3–1.2)
Total Protein: 7.1 g/dL (ref 6.0–8.3)

## 2011-05-05 LAB — PROTIME-INR
INR: 1 (ref 0.00–1.49)
INR: 1 (ref 0.00–1.49)
Prothrombin Time: 12.9 seconds (ref 11.6–15.2)
Prothrombin Time: 13.2 seconds (ref 11.6–15.2)

## 2011-05-05 LAB — POCT CARDIAC MARKERS
CKMB, poc: 4.1 ng/mL (ref 1.0–8.0)
Myoglobin, poc: 221 ng/mL (ref 12–200)
Troponin i, poc: <0.05 ng/mL (ref 0.00–0.09)

## 2011-05-05 LAB — CK TOTAL AND CKMB (NOT AT ARMC)
CK, MB: 6.6 ng/mL — ABNORMAL HIGH (ref 0.3–4.0)
Relative Index: 1.2 (ref 0.0–2.5)
Total CK: 568 U/L — ABNORMAL HIGH (ref 7–232)

## 2011-05-05 LAB — LIPID PANEL
HDL: 35 mg/dL — ABNORMAL LOW (ref >39)
Total CHOL/HDL Ratio: 8 RATIO
Triglycerides: 155 mg/dL — ABNORMAL HIGH (ref <150)
VLDL: 31 mg/dL (ref 0–40)

## 2011-05-05 LAB — D-DIMER, QUANTITATIVE (NOT AT ARMC)

## 2011-05-05 LAB — APTT: aPTT: 33 seconds (ref 24–37)

## 2011-05-05 LAB — TSH: TSH: 1.243 u[IU]/mL (ref 0.350–4.500)

## 2011-05-05 LAB — B-NATRIURETIC PEPTIDE (CONVERTED LAB)
Pro B Natriuretic peptide (BNP): 36.4 pg/mL (ref 0.0–100.0)
Pro B Natriuretic peptide (BNP): 42 pg/mL (ref 0.0–100.0)

## 2011-05-05 LAB — TROPONIN I

## 2011-07-06 ENCOUNTER — Encounter: Payer: Self-pay | Admitting: Cardiology

## 2011-07-06 ENCOUNTER — Encounter: Payer: Self-pay | Admitting: *Deleted

## 2011-07-06 ENCOUNTER — Ambulatory Visit (INDEPENDENT_AMBULATORY_CARE_PROVIDER_SITE_OTHER): Payer: 59 | Admitting: Cardiology

## 2011-07-06 VITALS — BP 102/70 | HR 68 | Ht 72.0 in | Wt 217.0 lb

## 2011-07-06 DIAGNOSIS — E785 Hyperlipidemia, unspecified: Secondary | ICD-10-CM | POA: Insufficient documentation

## 2011-07-06 DIAGNOSIS — I350 Nonrheumatic aortic (valve) stenosis: Secondary | ICD-10-CM

## 2011-07-06 DIAGNOSIS — I2510 Atherosclerotic heart disease of native coronary artery without angina pectoris: Secondary | ICD-10-CM

## 2011-07-06 DIAGNOSIS — I739 Peripheral vascular disease, unspecified: Secondary | ICD-10-CM | POA: Insufficient documentation

## 2011-07-06 DIAGNOSIS — I359 Nonrheumatic aortic valve disorder, unspecified: Secondary | ICD-10-CM

## 2011-07-06 DIAGNOSIS — I208 Other forms of angina pectoris: Secondary | ICD-10-CM

## 2011-07-06 DIAGNOSIS — I209 Angina pectoris, unspecified: Secondary | ICD-10-CM

## 2011-07-06 DIAGNOSIS — I2089 Other forms of angina pectoris: Secondary | ICD-10-CM | POA: Insufficient documentation

## 2011-07-06 LAB — CBC WITH DIFFERENTIAL/PLATELET
Basophils Absolute: 0 10*3/uL (ref 0.0–0.1)
Basophils Relative: 0.3 % (ref 0.0–3.0)
Eosinophils Absolute: 0.1 10*3/uL (ref 0.0–0.7)
Eosinophils Relative: 1.1 % (ref 0.0–5.0)
HCT: 44.4 % (ref 39.0–52.0)
Hemoglobin: 15.2 g/dL (ref 13.0–17.0)
Lymphocytes Relative: 36 % (ref 12.0–46.0)
Lymphs Abs: 2.8 10*3/uL (ref 0.7–4.0)
MCHC: 34.2 g/dL (ref 30.0–36.0)
MCV: 91.3 fl (ref 78.0–100.0)
Monocytes Absolute: 0.7 10*3/uL (ref 0.1–1.0)
Monocytes Relative: 8.8 % (ref 3.0–12.0)
Neutro Abs: 4.1 10*3/uL (ref 1.4–7.7)
Neutrophils Relative %: 53.8 % (ref 43.0–77.0)
Platelets: 233 10*3/uL (ref 150.0–400.0)
RBC: 4.86 Mil/uL (ref 4.22–5.81)
RDW: 13.5 % (ref 11.5–14.6)
WBC: 7.6 10*3/uL (ref 4.5–10.5)

## 2011-07-06 LAB — BASIC METABOLIC PANEL
BUN: 14 mg/dL (ref 6–23)
CO2: 27 mEq/L (ref 19–32)
Calcium: 9.2 mg/dL (ref 8.4–10.5)
Chloride: 102 mEq/L (ref 96–112)
Creatinine, Ser: 1.1 mg/dL (ref 0.4–1.5)
GFR: 73.75 mL/min (ref >60.00)
Glucose, Bld: 85 mg/dL (ref 70–99)
Potassium: 4.2 mEq/L (ref 3.5–5.1)
Sodium: 137 mEq/L (ref 135–145)

## 2011-07-06 LAB — PROTIME-INR
INR: 1 ratio (ref 0.8–1.0)
Prothrombin Time: 11.1 s (ref 10.2–12.4)

## 2011-07-06 MED ORDER — ASPIRIN EC 81 MG PO TBEC: 81.0000 mg | DELAYED_RELEASE_TABLET | Freq: Every day | ORAL | Status: DC

## 2011-07-06 NOTE — Assessment & Plan Note (Signed)
His lipids at an extremely high in the past. We'll clearly need a statin. I did not start it today until we have fasting lipids and LFTs.

## 2011-07-06 NOTE — Assessment & Plan Note (Signed)
Stable. Aggressive risk factor modification including antiplatelet therapy and a statin.

## 2011-07-06 NOTE — Assessment & Plan Note (Signed)
This is essentially new onset of the last 4 weeks. I think the etiology of his chest discomfort and dyspnea on exertion his progressive coronary disease, most likely his circumflex based on his previous catheterization. It is possible that his aortic stenosis has deteriorated as well and may be contributing to the symptoms he is having.   I've recommended cardiac catheterization, both right and left, to delineate the problem. I will forego an echocardiogram until further information is available from the catheterization. Risks indications benefit and potential outcome have been discussed. He does not have a dye allergy. We'll arrange for next Tuesday with Dr. Excell Seltzer.

## 2011-07-06 NOTE — Assessment & Plan Note (Signed)
His aortic stenosis was mild in 2009 at the time of his catheterization. If indeed he does have a bicuspid valve, it could have deteriorated substantially over the last 3 years . Have recommended a right and left cardiac catheterization. He may need a 2-D echocardiogram as well but we'll wait to the catheterization is done. By exam his S2 still is less and most likely it is only moderate in degree. I suspect his symptoms are due to progressive coronary disease.

## 2011-07-06 NOTE — Progress Notes (Signed)
HPI [name] is a 60-year-old white male who is referred today as a same-day evaluation for new onset shortness of breath and chest discomfort.  This started about 4 weeks ago. Prior to that time, he was doing well. He is a high school football official.  His cardiac history is remarkable for nonobstructive coronary disease and mild aortic stenosis by catheterization in 2009. He had normal left ventricular function the time. He also has most likely a bicuspid aortic valve. He had minimal pulmonary hypertension. He was treated medically.  He denies any presyncope or syncope. He does have remarkable new dyspnea on exertion with walking less than 70 yards and had chest tightness taking out the trash last week.  He denies orthopnea, PND, palpitations.  He has a history of hypertension, hyperlipidemia, and used to be quite heavy. He is not taking anything for a severe hyperlipidemia. He is also not taking any antiplatelet therapy.  Past Medical History  Diagnosis Date  . Hypertension   . Hyperlipidemia   . Depression   . PVD (peripheral vascular disease)   . Femoral-popliteal bypass graft occlusion, right 1997    Dr. Lawson  . Claudication in peripheral vascular disease   . Aortic stenosis 2009    ef 40-45%     Current Outpatient Prescriptions  Medication Sig Dispense Refill  . citalopram (CELEXA) 40 MG tablet Take 40 mg by mouth daily.        . enalapril (VASOTEC) 5 MG tablet Take 5 mg by mouth daily.        . aspirin EC 81 MG tablet Take 1 tablet (81 mg total) by mouth daily.  90 tablet  3    Allergies  Allergen Reactions  . Codeine     No family history on file.  History   Social History  . Marital Status: Married    Spouse Name: N/A    Number of Children: N/A  . Years of Education: N/A   Occupational History  . Not on file.   Social History Main Topics  . Smoking status: Never Smoker   . Smokeless tobacco: Not on file  . Alcohol Use: No  . Drug Use: No  .  Sexually Active:    Other Topics Concern  . Not on file   Social History Narrative  . No narrative on file    ROS ALL NEGATIVE EXCEPT THOSE NOTED IN HPI  PE  General Appearance: well developed, well nourished in no acute distress, overweight HEENT: symmetrical face, PERRLA, good dentition  Neck: no JVD, thyromegaly, or adenopathy, trachea midline Chest: symmetric without deformity Cardiac: PMI non-displaced, RRR, normal S1, S2, no gallop , 2/6 systolic murmur consistent with aortic stenosis, S2 does split left upper shoulder border Lung: clear to ausculation and percussion Vascular: all pulses full without bruits  Abdominal: nondistended, nontender, good bowel sounds, no HSM, no bruits Extremities: no cyanosis, clubbing or edema, no sign of DVT, no varicosities  Skin: normal color, no rashes Neuro: alert and oriented x 3, non-focal Pysch: normal affect  EKG EKG from [facility] today shows sinus bradycardia with downsloping ST segment changes in the lateral leads. I have no old EKG to compare BMET    Component Value Date/Time   NA 137 07/06/2011 1452   K 4.2 07/06/2011 1452   CL 102 07/06/2011 1452   CO2 27 07/06/2011 1452   GLUCOSE 85 07/06/2011 1452   BUN 14 07/06/2011 1452   CREATININE 1.1 07/06/2011 1452   CALCIUM 9.2   07/06/2011 1452   GFRNONAA >60 07/08/2008 0525   GFRAA  Value: >60        The eGFR has been calculated using the MDRD equation. This calculation has not been validated in all clinical 07/08/2008 0525    Lipid Panel     Component Value Date/Time   CHOL  Value: 280        ATP III CLASSIFICATION:  <200     mg/dL   Desirable  200-239  mg/dL   Borderline High  >=240    mg/dL   High* 07/07/2008 0410   TRIG 155* 07/07/2008 0410   HDL 35* 07/07/2008 0410   CHOLHDL 8.0 07/07/2008 0410   VLDL 31 07/07/2008 0410   LDLCALC  Value: 214        Total Cholesterol/HDL:CHD Risk Coronary Heart Disease Risk Table                     Men   Women  1/2 Average Risk   3.4   3.3*  07/07/2008 0410    CBC    Component Value Date/Time   WBC 7.6 07/06/2011 1452   RBC 4.86 07/06/2011 1452   HGB 15.2 07/06/2011 1452   HCT 44.4 07/06/2011 1452   PLT 233.0 07/06/2011 1452   MCV 91.3 07/06/2011 1452   MCHC 34.2 07/06/2011 1452   RDW 13.5 07/06/2011 1452   LYMPHSABS 2.8 07/06/2011 1452   MONOABS 0.7 07/06/2011 1452   EOSABS 0.1 07/06/2011 1452   BASOSABS 0.0 07/06/2011 1452      

## 2011-07-07 ENCOUNTER — Telehealth: Payer: Self-pay | Admitting: Cardiology

## 2011-07-07 NOTE — Telephone Encounter (Signed)
Pt will start Aspirin 81 mg daily Pt aware of lab results. Mylo Red RN

## 2011-07-07 NOTE — Telephone Encounter (Signed)
Fu call °Returning your call °

## 2011-07-11 ENCOUNTER — Inpatient Hospital Stay (HOSPITAL_BASED_OUTPATIENT_CLINIC_OR_DEPARTMENT_OTHER)
Admission: RE | Admit: 2011-07-11 | Discharge: 2011-07-11 | Disposition: A | Discharge status: Home / Self Care | Payer: 59 | Source: Ambulatory Visit | Attending: Cardiovascular Disease | Admitting: Cardiovascular Disease

## 2011-07-11 ENCOUNTER — Encounter (HOSPITAL_BASED_OUTPATIENT_CLINIC_OR_DEPARTMENT_OTHER)
Admission: RE | Disposition: A | Discharge status: Home / Self Care | Payer: Self-pay | Source: Ambulatory Visit | Attending: Cardiovascular Disease

## 2011-07-11 DIAGNOSIS — I2510 Atherosclerotic heart disease of native coronary artery without angina pectoris: Secondary | ICD-10-CM

## 2011-07-11 DIAGNOSIS — I359 Nonrheumatic aortic valve disorder, unspecified: Secondary | ICD-10-CM | POA: Insufficient documentation

## 2011-07-11 DIAGNOSIS — Q231 Congenital insufficiency of aortic valve: Secondary | ICD-10-CM | POA: Insufficient documentation

## 2011-07-11 DIAGNOSIS — R0989 Other specified symptoms and signs involving the circulatory and respiratory systems: Secondary | ICD-10-CM | POA: Insufficient documentation

## 2011-07-11 DIAGNOSIS — I209 Angina pectoris, unspecified: Secondary | ICD-10-CM

## 2011-07-11 DIAGNOSIS — R0609 Other forms of dyspnea: Secondary | ICD-10-CM | POA: Insufficient documentation

## 2011-07-11 LAB — POCT I-STAT 3, VENOUS BLOOD GAS (G3P V)
Acid-base deficit: 2 mmol/L (ref 0.0–2.0)
Bicarbonate: 24.8 mEq/L — ABNORMAL HIGH (ref 20.0–24.0)
O2 Saturation: 64 %
TCO2: 26 mmol/L (ref 0–100)
pCO2, Ven: 47.8 mmHg (ref 45.0–50.0)
pH, Ven: 7.323 — ABNORMAL HIGH (ref 7.250–7.300)
pO2, Ven: 36 mmHg (ref 30.0–45.0)

## 2011-07-11 LAB — POCT I-STAT 3, ART BLOOD GAS (G3+)
Acid-base deficit: 1 mmol/L (ref 0.0–2.0)
Acid-base deficit: 2 mmol/L (ref 0.0–2.0)
Bicarbonate: 24 mEq/L (ref 20.0–24.0)
Bicarbonate: 25.1 mEq/L — ABNORMAL HIGH (ref 20.0–24.0)
O2 Saturation: 86 %
O2 Saturation: 94 %
TCO2: 25 mmol/L (ref 0–100)
TCO2: 27 mmol/L (ref 0–100)
pCO2 arterial: 45.5 mmHg — ABNORMAL HIGH (ref 35.0–45.0)
pCO2 arterial: 47.2 mmHg — ABNORMAL HIGH (ref 35.0–45.0)
pH, Arterial: 7.329 — ABNORMAL LOW (ref 7.350–7.450)
pH, Arterial: 7.334 — ABNORMAL LOW (ref 7.350–7.450)
pO2, Arterial: 55 mmHg — ABNORMAL LOW (ref 80.0–100.0)
pO2, Arterial: 75 mmHg — ABNORMAL LOW (ref 80.0–100.0)

## 2011-07-11 SURGERY — JV LEFT AND RIGHT HEART CATHETERIZATION WITH CORONARY ANGIOGRAM
Anesthesia: Moderate Sedation

## 2011-07-11 MED ORDER — SODIUM CHLORIDE 0.9 % IV SOLN
INTRAVENOUS | Status: DC
  Administered 2011-07-11: 12:00:00 via INTRAVENOUS

## 2011-07-11 MED ORDER — ACETAMINOPHEN 325 MG PO TABS: 650.0000 mg | ORAL_TABLET | ORAL | Status: DC | PRN

## 2011-07-11 MED ORDER — ASPIRIN 81 MG PO CHEW
324.0000 mg | CHEWABLE_TABLET | Freq: Once | ORAL | Status: AC
  Administered 2011-07-11: 324 mg via ORAL

## 2011-07-11 MED ORDER — SODIUM CHLORIDE 0.9 % IV SOLN: INTRAVENOUS | Status: DC

## 2011-07-11 MED ORDER — ONDANSETRON HCL 4 MG/2ML IJ SOLN: 4.0000 mg | Freq: Four times a day (QID) | INTRAMUSCULAR | Status: DC | PRN

## 2011-07-11 MED ORDER — DIAZEPAM 5 MG PO TABS
5.0000 mg | ORAL_TABLET | Freq: Once | ORAL | Status: AC
  Administered 2011-07-11: 5 mg via ORAL

## 2011-07-11 NOTE — H&P (View-Only) (Signed)
HPI [name] is a 60 year old white male who is referred today as a same-day evaluation for new onset shortness of breath and chest discomfort.  This started about 4 weeks ago. Prior to that time, he was doing well. He is a high school football official.  His cardiac history is remarkable for nonobstructive coronary disease and mild aortic stenosis by catheterization in 2009. He had normal left ventricular function the time. He also has most likely a bicuspid aortic valve. He had minimal pulmonary hypertension. He was treated medically.  He denies any presyncope or syncope. He does have remarkable new dyspnea on exertion with walking less than 70 yards and had chest tightness taking out the trash last week.  He denies orthopnea, PND, palpitations.  He has a history of hypertension, hyperlipidemia, and used to be quite heavy. He is not taking anything for a severe hyperlipidemia. He is also not taking any antiplatelet therapy.  Past Medical History  Diagnosis Date  . Hypertension   . Hyperlipidemia   . Depression   . PVD (peripheral vascular disease)   . Femoral-popliteal bypass graft occlusion, right 1997    Dr. Hart [place]  . Claudication in peripheral vascular disease   . Aortic stenosis 2009    ef 40-45%     Current Outpatient Prescriptions  Medication Sig Dispense Refill  . citalopram (CELEXA) 40 MG tablet Take 40 mg by mouth daily.        . enalapril (VASOTEC) 5 MG tablet Take 5 mg by mouth daily.        [name] aspirin EC 81 MG tablet Take 1 tablet (81 mg total) by mouth daily.  90 tablet  3    Allergies  Allergen Reactions  . Codeine     No family history on file.  History   Social History  . Marital Status: Married    Spouse Name: N/A    Number of Children: N/A  . Years of Education: N/A   Occupational History  . Not on file.   Social History Main Topics  . Smoking status: Never Smoker   . Smokeless tobacco: Not on file  . Alcohol Use: No  . Drug Use: No  .  Sexually Active:    Other Topics Concern  . Not on file   Social History Narrative  . No narrative on file    ROS ALL NEGATIVE EXCEPT THOSE NOTED IN HPI  PE  General Appearance: well developed, well nourished in no acute distress, overweight HEENT: symmetrical face, PERRLA, good dentition  Neck: no JVD, thyromegaly, or adenopathy, trachea midline Chest: symmetric without deformity Cardiac: PMI non-displaced, RRR, normal S1, S2, no gallop , 2/6 systolic murmur consistent with aortic stenosis, S2 does split left upper shoulder border Lung: clear to ausculation and percussion Vascular: all pulses full without bruits  Abdominal: nondistended, nontender, good bowel sounds, no HSM, no bruits Extremities: no cyanosis, clubbing or edema, no sign of DVT, no varicosities  Skin: normal color, no rashes Neuro: alert and oriented x 3, non-focal Pysch: normal affect  EKG EKG from [facility] today shows sinus bradycardia with downsloping ST segment changes in the lateral leads. I have no old EKG to compare BMET    Component Value Date/Time   NA 137 07/06/2011 1452   K 4.2 07/06/2011 1452   CL 102 07/06/2011 1452   CO2 27 07/06/2011 1452   GLUCOSE 85 07/06/2011 1452   BUN 14 07/06/2011 1452   CREATININE 1.1 07/06/2011 1452   CALCIUM 9.2  07/06/2011 1452   GFRNONAA >60 07/08/2008 0525   GFRAA  Value: >60        The eGFR has been calculated using the MDRD equation. This calculation has not been validated in all clinical 07/08/2008 0525    Lipid Panel     Component Value Date/Time   CHOL  Value: 280        ATP III CLASSIFICATION:  <200     mg/dL   Desirable  952-841  mg/dL   Borderline High  >=324    mg/dL   High* 40/07/270 5366   TRIG 155* 07/07/2008 0410   HDL 35* 07/07/2008 0410   CHOLHDL 8.0 07/07/2008 0410   VLDL 31 07/07/2008 0410   LDLCALC  Value: 214        Total Cholesterol/HDL:CHD Risk Coronary Heart Disease Risk Table                     Men   Women  1/2 Average Risk   3.4   3.3*  07/07/2008 0410    CBC    Component Value Date/Time   WBC 7.6 07/06/2011 1452   RBC 4.86 07/06/2011 1452   HGB 15.2 07/06/2011 1452   HCT 44.4 07/06/2011 1452   PLT 233.0 07/06/2011 1452   MCV 91.3 07/06/2011 1452   MCHC 34.2 07/06/2011 1452   RDW 13.5 07/06/2011 1452   LYMPHSABS 2.8 07/06/2011 1452   MONOABS 0.7 07/06/2011 1452   EOSABS 0.1 07/06/2011 1452   BASOSABS 0.0 07/06/2011 1452

## 2011-07-11 NOTE — Op Note (Signed)
Cardiac Catheterization Procedure Note  Name: [name] MRN: [id number] DOB: 1951-07-25  Procedure: Right Heart Cath, Left Heart Cath, Selective Coronary Angiography, LV angiography  Indication: A 60 year old gentleman referred for right and left heart catheterization in the setting of class III angina and exertional dyspnea. He has a known bicuspid aortic valve and his physical exam is consistent with severe aortic stenosis. He has not had a recent echocardiogram.   Procedural Details: The right groin was prepped, draped, and anesthetized with 1% lidocaine. Using the modified Seldinger technique a 6 French sheath was placed in the right femoral artery and a 6 French sheath was placed in the right femoral vein. A multipurpose catheter was used for the right heart catheterization. Standard protocol was followed for recording of right heart pressures and sampling of oxygen saturations. Fick cardiac output was calculated. Standard Judkins catheters were used for selective coronary angiography and left ventriculography. An exchange length straight tip wire was used to cross the aortic valve. The wire was directed with an AL- 2 catheter. This was changed out for a Langston pigtail catheter for simultaneous LV and aortic pressure recording. There were no immediate procedural complications. The patient was transferred to the post catheterization recovery area for further monitoring.  Procedural Findings: Hemodynamics RA 3 RV 26/6 PA 24/8 with a mean of 14 PCWP 8 LV 173/21 AO 124/72 with a mean of 93  Oxygen saturations: PA 64% AO 94%  Cardiac Output (Fick) 4.5 L per minute  Cardiac Index (Fick) 2 L/minute/meter square  Aortic valve hemodynamics: Mean gradient 42, aortic valve area 0.76, aortic valve area index 0.34   Coronary angiography: Coronary dominance: right  Left mainstem: Widely patent with no significant obstructive disease.  There is a 20-30% stenosis in the mid shaft of the  left main.  Left anterior descending (LAD): The LAD is a large-caliber vessel. The proximal LAD is tortuous. The mid LAD at the origin of the second diagonal branch has a 30-40% stenosis. The apical portion of the LAD is small in caliber. There are no high-grade stenoses throughout the course of the LAD.  Left circumflex (LCx): The left circumflex has 50-60% ostial stenosis. The mid circumflex has 80% stenosis leading into a large second OM branch.  The first obtuse marginal was very small in caliber.  Right coronary artery (RCA): The RCA is dominant. It is an ectatic vessel with diffuse irregularity but there are no areas of high-grade stenosis identified. The proximal RCA has 30% stenosis. The distal RCA just before the origin of the PDA branch has a 50% stenosis. The PDA and posterolateral branches have mild diffuse disease without focal high-grade stenosis.  Left ventriculography: Left ventricular systolic function is in the low-normal range. The left ventricular ejection fraction is estimated at 50-55%. There is no significant mitral regurgitation identified.  Final Conclusions:   1. Severe bicuspid valve aortic stenosis 2. Severe left circumflex stenosis 3. Mild nonobstructive LAD and right coronary artery stenoses 4. Essentially normal left ventricular systolic function with an estimated left ventricular ejection fraction of 50-55% 5. Essentially normal right heart hemodynamics  Recommendations: [facility] consultation for consideration of aortic valve replacement and bypass of the left circumflex (obtuse marginal branch).   [name] 07/11/2011, 2:01 PM

## 2011-07-11 NOTE — Progress Notes (Signed)
Bedrest begins @ 1215.  Dr. Excell Seltzer in to discuss plan of care with patient and family.

## 2011-07-11 NOTE — Interval H&P Note (Signed)
History and Physical Interval Note:  07/11/2011 12:39 PM  [name]  has presented today for surgery, with the diagnosis of cp/as  The various methods of treatment have been discussed with the patient and family. After consideration of risks, benefits and other options for treatment, the patient has consented to  Procedure(s): JV LEFT AND RIGHT HEART CATHETERIZATION WITH CORONARY ANGIOGRAM as a surgical intervention .  The patients' history has been reviewed, patient examined, no change in status, stable for surgery.  I have reviewed the patients' chart and labs.  Questions were answered to the patient's satisfaction.     [name]

## 2011-07-11 NOTE — OR Nursing (Signed)
Discharge instructions reviewed and signed, pt stated understanding, ambulated in hall without difficulty, site intact, level 0, no bleeding, transported to wife's car via wheelchair 

## 2011-07-12 ENCOUNTER — Other Ambulatory Visit (HOSPITAL_COMMUNITY): Payer: Self-pay | Admitting: Cardiovascular Disease

## 2011-07-12 DIAGNOSIS — I350 Nonrheumatic aortic (valve) stenosis: Secondary | ICD-10-CM

## 2011-07-13 ENCOUNTER — Ambulatory Visit (HOSPITAL_COMMUNITY): Payer: 59 | Attending: Cardiovascular Disease | Admitting: Radiology

## 2011-07-13 ENCOUNTER — Encounter: Payer: 59 | Admitting: Cardiothoracic Surgery

## 2011-07-13 ENCOUNTER — Other Ambulatory Visit (HOSPITAL_COMMUNITY): Payer: 59

## 2011-07-13 ENCOUNTER — Encounter: Payer: Self-pay | Admitting: Cardiothoracic Surgery

## 2011-07-13 ENCOUNTER — Encounter (HOSPITAL_COMMUNITY): Payer: Self-pay | Admitting: Pharmacy Technician

## 2011-07-13 ENCOUNTER — Ambulatory Visit: Payer: 59 | Admitting: Cardiothoracic Surgery

## 2011-07-13 ENCOUNTER — Institutional Professional Consult (permissible substitution) (INDEPENDENT_AMBULATORY_CARE_PROVIDER_SITE_OTHER): Payer: 59 | Admitting: Cardiothoracic Surgery

## 2011-07-13 ENCOUNTER — Encounter: Payer: Self-pay | Admitting: *Deleted

## 2011-07-13 ENCOUNTER — Other Ambulatory Visit: Payer: Self-pay | Admitting: Cardiothoracic Surgery

## 2011-07-13 ENCOUNTER — Other Ambulatory Visit: Payer: Self-pay

## 2011-07-13 VITALS — BP 125/79 | HR 68 | Resp 18 | Ht 72.0 in | Wt 217.0 lb

## 2011-07-13 DIAGNOSIS — I359 Nonrheumatic aortic valve disorder, unspecified: Secondary | ICD-10-CM

## 2011-07-13 DIAGNOSIS — I10 Essential (primary) hypertension: Secondary | ICD-10-CM | POA: Insufficient documentation

## 2011-07-13 DIAGNOSIS — I350 Nonrheumatic aortic (valve) stenosis: Secondary | ICD-10-CM

## 2011-07-13 DIAGNOSIS — I351 Nonrheumatic aortic (valve) insufficiency: Secondary | ICD-10-CM

## 2011-07-13 DIAGNOSIS — E785 Hyperlipidemia, unspecified: Secondary | ICD-10-CM | POA: Insufficient documentation

## 2011-07-13 DIAGNOSIS — Q231 Congenital insufficiency of aortic valve: Secondary | ICD-10-CM | POA: Insufficient documentation

## 2011-07-13 DIAGNOSIS — I2510 Atherosclerotic heart disease of native coronary artery without angina pectoris: Secondary | ICD-10-CM

## 2011-07-13 DIAGNOSIS — I712 Thoracic aortic aneurysm, without rupture: Secondary | ICD-10-CM

## 2011-07-13 DIAGNOSIS — I059 Rheumatic mitral valve disease, unspecified: Secondary | ICD-10-CM | POA: Insufficient documentation

## 2011-07-13 NOTE — Patient Instructions (Addendum)
Thoracic Aortic Aneurysm An aneurysm is the enlargement (dilatation), bulging or ballooning out of part of the wall of a vein or artery. An Aortic Aneurysm is a bulging in the largest artery of the body. This artery supplies blood from the heart to the rest of the body. The first part of the aorta is called the thoracic aorta. It leaves the heart, ascends (rises), arches, and descends (goes down) through the chest until it reaches the diaphragm (the muscular partition between the chest and abdomen (belly). The second part of the aorta is then called the abdominal aorta after it has passed the diaphragm and continues down through the abdomen. The abdominal aorta ends where it splits to form the two iliac arteries that go to the legs. Aortic aneurysms can develop anywhere along the length of the aorta. A thoracic aortic aneurysm (TAA) occurs in the first part of the aorta, between the heart and the diaphragm. The major importance of an aneurysm is that it can rupture or tear (dissect), causing death unless diagnosed and treated promptly. CAUSES   Most thoracic aortic aneurysms are related to arteriosclerosis. The arteriosclerosis can weaken the aortic wall. The pressure of the blood being pumped through the aorta causes it to balloon out at the site of weakness. Therefore, elevated blood pressure (hypertension) is associated with aneurysm. Other risk factors include:  Age over 52.     Tobacco use.     Male sex.     Family history of aneurysm.  Additional causes of thoracic aortic aneurysms include:  Genetics (passed by birth).     Injury: After physical trauma to the aorta.     Inflammation of blood vessels.     Hardening of the arteries.     Infection.  SYMPTOMS  Many aneurysms do not cause problems. A small, unchanging or slowly changing aneurysm may produce no symptoms until it suddenly ruptures or dissects (separation of the layers of the aortic wall) without warning. It may then cause  death. The symptoms (problems) of a developing aneurysm will partly depend on its size and rate of growth. Thoracic aortic aneurysms may cause pain in the:  Chest.     Back.    Sides.    Abdomen.  The pain most often has a deep quality as if it is boring into the person. It may cause:  Heart failure.     Heart attack.     Hoarseness.    Cough.    Shortness of breath.     Swallowing problems.  DIAGNOSIS   A thoracic aortic aneurysm may be suspected based on your symptoms. It may also be detected by x-ray or CT studies done for unrelated reasons.   Several different imaging studies can be used to confirm a TAA:  An echocardiogram is an ultrasound test to examine the heart. It can also examine the first parts of the aorta. Sometimes, this test is done by putting you to sleep and inserting a flexible telescope through your mouth into your esophagus, which is next to the aorta; excellent pictures of the aorta can be obtained. This is called a transesophageal echocardiogram (TEE).     CT scanning of the chest is accurate at showing the exact size and shape of the aneurysm.     MRI scanning is accurate, and is used for certain types of TAA.     An aortic angiogram shows the source of the major blood vessels arising from the aorta. It reveals the size and extent  of any aneurysm. It can also show a clot clinging to the wall of the aneurysm (mural thrombus). The angiogram may give information about a tear of the aorta.  TREATMENT   Treatment for a thoracic aortic aneurysm depends on:  Location.     Size.    Other factors.     Rate of growth.     Underlying cause.  Medical treatment is used for smaller or complicated aneurysms, or those that do not cause symptoms. These include:  Stopping smoking.     Blood pressure control.     Control of cholesterol.  Surgical treatment is used for aneurysms that cause symptoms, or for those that are large or growing in size. The surgical  technique depends on the location of the aneurysm. HOME CARE INSTRUCTIONS    If you smoke, stop. If you do not, do not start.     Take all medications as prescribed.     Your caregiver will tell you when to have your aneurysm rechecked, either by echocardiogram or CT scan. Be sure to keep this and all follow-up appointments.  SEEK MEDICAL CARE IF:    You develop mild pain in your chest, upper back, sides, or abdomen.     You develop cough, hoarseness or trouble swallowing.  SEEK IMMEDIATE MEDICAL CARE IF:    You develop severe chest or abdominal pain, or severe pain moving (radiating) to your back.     You suddenly develop cold or blue toes or feet.     You suddenly develop lightheadedness or fainting spells.     You develop trouble breathing.  Document Released: 07/17/2005 Document Revised: 03/29/2011 Document Reviewed: 06/05/2007 [facility] Patient Information 2012 [place], [place].   Aortic Stenosis Aortic stenosis, or aortic valve stenosis, is a narrowing of the aortic valve. When the aortic valve is narrowed, the valve does not open and close very well. This restricts blood flow between the left side of the heart and the aorta (the large artery which takes blood to the rest of the body). This restriction makes it hard for your heart to pump blood. This extra work can weaken your heart and can lead to heart failure. CAUSES   Causes of aortic valve stenosis can vary. Some of these can include:  Calcium deposits on the aortic valve. Calcium can buildup on the aortic valve and make it stiff. This cause of aortic stenosis is most common in people over the age of 23.     Congenital heart defect. This can occur during the development of the fetus and can result in an aortic valve defect.     Rheumatic fever. Rheumatic fever is a bacterial infection that can develop from a strep throat infection. The bacteria from rheumatic fever can attach themselves to the valve. This can cause  scarring on the aortic valve, causing it to become narrow.  SYMPTOMS   Symptoms of aortic valve stenosis develop when the valve disease is severe. Symptoms can include:  Shortness of breath, especially with physical activity.     Feeling tired (fatigue).     Chest pain (angina) or tightness.     Feeing your heart race or beat funny (heart palpitations).     Dizziness or fainting.  DIAGNOSIS   Aortic stenosis is diagnosed through:  A physical exam and symptoms.     A heart murmur.     Echocardiography. This test uses sound waves to produce images of your heart.  TREATMENT    Surgery is the  treatment for aortic valve stenosis.     Surgery may not be needed right away. Surgery is necessary when narrowing of the aortic valve becomes severe, and symptoms develop or become worse.     Medications cannot reverse aortic valve stenosis.  HOME CARE INSTRUCTIONS    If you have aortic stenosis, you many need to avoid strenuous physical activity. Talk with your caregiver about what types of activities you should avoid.     If you are a woman with aortic valve stenosis and are of child-bearing age, talk to your caregiver before you become pregnant.     If you become pregnant, you will need to be monitored by your obstetrician and cardiologist throughout your pregnancy, labor and delivery, and after delivery.  SEEK IMMEDIATE MEDICAL CARE IF:  You develop chest pain or tightness.     You develop shortness of breath or difficulty breathing.     You develop lightheadedness or fainting.     You have heart palpitations or skipped heartbeats.  Document Released: 04/15/2003 Document Revised: 03/29/2011 Document Reviewed: 11/23/2009 [facility] Patient Information 2012 [place], [place]  .Aortic Valve Replacement You have a disease of one of the valves of your heart. In you or your child's case, it is the aortic valve which needs replacing. Aortic valve replacement is open heart surgery done by a  heart surgeon. This operation treats problems with the aortic valve. The aortic valve is the "outflow valve" for the left side of the heart. The left side of your heart (left ventricle) is the large muscular part of the heart that pumps blood to the rest of the body. It separates the left ventricle from the aorta. When the heart squeezes down (contracts), the aortic valve is what keeps the blood from flowing back into the ventricle from the aorta. This allows the blood to keep moving through the body.   Surgery may be necessary when the valve does not open or close completely. A stenotic (narrow) valve does not let the blood leave the heart normally. This causes blood to back up in the left ventricle. This makes it hard for the heart to increase the amount of blood that it pumps. The heart has to work harder. This may produce shortness of breath and fatigue. Problems are worse with activity.   If the valve leaflets do not meet correctly when closing, blood may leak backward into the ventricle each time the heart pumps. This is called aortic insufficiency. When some of the blood leaks backwards, the heart has to work even harder. The heart can allow for this over-work for a long time if the leakage came on slowly. Eventually, the heart fails.   Aortic valve problems may be caused by a birth defect. This is called congenital. Wear and tear can cause valves to fail. More commonly, rheumatic fever may damage the aortic valve. Occasionally, the valve may be damaged by infection. This also causes the aortic valve to leak.   DESCRIPTION OF SURGERY Aortic valves can be repaired. When the valve is too damaged to repair, the valve must be replaced. A prosthetic (artificial) valve is used to do this. Valves damaged by rheumatic disease often must be replaced.   Two types of artificial valves are available:  Mechanical valves made entirely from man-made materials.     Biological valves which are made from animal  tissues or taken from a cadaver.  Each has advantages and disadvantages. The choice of which type to use should be made by  you and your surgeon. Your risks, age, lifestyle, other medical problems including the decision on whether to be on blood thinners the rest of your life all will help you decide on which type of valve to use. There are a number of good MECHANICAL PROSTHESES available. All work well. The main advantage of mechanical valves is that they do not wear out. Their main disadvantage is that blood clots easier on mechanical valves. If this happens the valve will not work normally. Because of this, patients with mechanical valves must take anticoagulants (blood thinners) for life. There is also a small but definite risk of blood clots causing stroke, even when taking anticoagulants.   There are a number of BIOLOGICAL CHOICES for aortic valve replacement. Most are made from pig aortic valves. Some are taken from cadavers. The main advantage is that they have a reduced risk of blood clots forming on the valve. This lessens the chance of the valve not working or causing a stroke. A large disadvantage of biological or tissue valves is that they wear out sooner than mechanical valves. The rate at which they wear out depends on the patient's age. A young boy might wear out such a valve in only a few years. The same valve might last 10 years in a middle aged person, and even longer in a patient over the age of 38. A tissue valve used in a person over 43 years old may never need replacement. RISKS AND COMPLICATIONS Your cardiologist and cardiothoracic surgeon can best determine your individual risk. It will depend on your age, general condition, medical conditions, and your heart function. In general, the risks include:  Problems from the operation itself are low risk. Some common risks are:     Risks from the anesthesia.     Bleeding and infection.     Lifelong treatment with medications to prevent  blood clots is needed for mechanical valve replacements.     Infection is more common with valve replacement than with valve repair.     Valve failure is more common with valve replacement than with valve repair. Pig valves tend to fail after about 8 to 10 years.  PROCEDURE   Valve repair or replacement is open-heart surgery. You are given general anesthesia (medications to help you sleep). You are then placed on a heart-lung machine. This machine provides oxygen to your blood while the heart is not working. The surgery generally lasts from 3 to 5 hours. During surgery, the surgeon makes a large incision (cut) in the chest. Sometimes the heart is cooled to slow or stop the heartbeat. The damaged aortic valve is either repaired or removed and replaced with an artificial heart valve. AFTER THE PROCEDURE  Recovery from heart valve surgery usually involves a few days in an intensive care unit (ICU) of a hospital. Full recovery from heart valve surgery can take several months.     Anticoagulation (blood thinning) treatment with warfarin is often prescribed for 6 weeks to 3 months after surgery for those with biological valves. It is prescribed for life for those with mechanical valves.     Recovery includes healing of the surgical incision. There is a gradual building of stamina and exercise abilities. An exercise program under the direction of a physical therapist may be recommended.     Once you have an artificial valve, your heart function and your life will return to normal. You usually feel better after surgery. Shortness of breath and fatigue should lessen. If your  heart was already severely damaged before your surgery, you may continue to have problems.     You can usually resume most of your normal activities. You will have to continue to monitor your condition. You need to watch out for blood clots and infections.     Artificial valves need to be replaced after a period of time. It is  important that you see your caregiver regularly.     Some individuals with an aortic valve replacement need to take antibiotics before having dental work or other surgical procedures. This is called prophylactic antibiotic treatment. These drugs help to prevent infective endocarditis. Antibiotics are only recommended for individuals with the highest risk for developing infective endocarditis. Let your dentist and your caregiver know if you have a history of any of the following so that the necessary precautions can be taken:     A VSD.     A repaired VSD.     Endocarditis in the past.     An artificial (prosthetic) heart valve.  HOME CARE INSTRUCTIONS    Use all medications as prescribed.     Take your temperature every morning for the first week after surgery. Record these.     Weigh yourself every morning for at least the first week after surgery and record.     Do not lift more than 10 pounds (4.5 kg) until your sternum (breastbone) has healed. Avoid all activities which would place strain on your incision.     You may shower but do not take baths until instructed by your caregivers.     Avoid driving for 4 to 6 weeks following surgery or as instructed.     Use your elastic stockings during the day. You should wear the stockings for at least 2 weeks after discharge or longer if your ankles are swollen. The stockings help blood flow and help reduce swelling in the legs. It is easiest to put the stockings on before you get out of bed in the morning. They should fit snugly.  SEEK IMMEDIATE MEDICAL CARE IF:  You develop chest pain which is not coming from your incision (surgical cut) .     You develop shortness of breath.     You develop a temperature over 101 F (38.3 C).     You have a sudden weight gain. Let your caregiver know what the weight gain is.  Document Released: 12/06/2004 Document Revised: 03/29/2011 Document Reviewed: 07/13/2008 [facility] Patient Information 2012  [place], [place]  .Aortic Valve Replacement Care After Read the instructions outlined below and refer to this sheet for the next few weeks. These discharge instructions provide you with general information on caring for yourself after you leave the hospital. Your surgeon may also give you specific instructions. While your treatment has been planned according to the most current medical practices available, unavoidable complications occasionally occur. If you have any problems or questions after discharge, please call your surgeon. AFTER THE PROCEDURE  Full recovery from heart valve surgery can take several months.     Blood thinning (anticoagulation) treatment with warfarin is often prescribed for 6 weeks to 3 months after surgery for those with biological valves. It is prescribed for life for those with mechanical valves.     Recovery includes healing of the surgical incision. There is a gradual building of stamina and exercise abilities. An exercise program under the direction of a physical therapist may be recommended.     Once you have an artificial valve, your heart  function and your life will return to normal. You usually feel better after surgery. Shortness of breath and fatigue should lessen. If your heart was already severely damaged before your surgery, you may continue to have problems.     You can usually resume most of your normal activities. You will have to continue to monitor your condition. You need to watch out for blood clots and infections.     Artificial valves need to be replaced after a period of time. It is important that you see your caregiver regularly.     Some individuals with an aortic valve replacement need to take antibiotics before having dental work or other surgical procedures. This is called prophylactic antibiotic treatment. These drugs help to prevent infective endocarditis. Antibiotics are only recommended for individuals with the highest risk for developing  infective endocarditis. Let your dentist and your caregiver know if you have a history of any of the following so that the necessary precautions can be taken:     A VSD.     A repaired VSD.     Endocarditis in the past.     An artificial (prosthetic) heart valve.  HOME CARE INSTRUCTIONS    Use all medications as prescribed.     Take your temperature every morning for the first week after surgery. Record these.     Weigh yourself every morning for at least the first week after surgery and record.     Do not lift more than 10 pounds (4.5 kg) until your breastbone (sternum) has healed. Avoid all activities which would place strain on your incision.     You may shower as soon as directed by your caregiver after surgery. Pat incisions dry. Do not rub incisions with washcloth or towel.     Avoid driving for 4 to 6 weeks following surgery or as instructed.     Use your elastic stockings during the day. You should wear the stockings for at least 2 weeks after discharge or longer if your ankles are swollen. The stockings help blood flow and help reduce swelling in the legs. It is easiest to put the stockings on before you get out of bed in the morning. They should fit snugly.  Pain Control  If a prescription was given for a pain reliever, please follow your doctor's directions.     If the pain is not relieved by your medicine, becomes worse, or you have difficulty breathing, call your surgeon.  Activity  Take frequent rest periods throughout the day.     Wait one week before returning to strenuous activities such as heavy lifting (more than 10 pounds), pushing or pulling.     Talk with your doctor about when you may return to work and your exercise routine.     Do not drive while taking prescription pain medication.  Nutrition  You may resume your normal diet.     Drink plenty of fluids (6-8 glasses a day).     Eat a well-balanced diet.     Call your caregiver for persistent  nausea or vomiting.  Elimination Your normal bowel function should return. If constipation should occur, you may:  Take a mild laxative.     Add fruit and bran to your diet.     Drink more fluids.     Call your doctor if constipation is not relieved.  SEEK IMMEDIATE MEDICAL CARE IF:    You develop chest pain which is not coming from your surgical cut (incision).  You develop shortness of breath or have difficulty breathing.     You develop a temperature over 101 F (38.3 C).     You have a sudden weight gain. Let your caregiver know what the weight gain is.     You develop a rash.     You develop any reaction or side effects to medications given.     You have increased bleeding from wounds.     You see redness, swelling, or have increasing pain in wounds.     You have pus coming from your wound.     You develop lightheadedness or feel faint.  Document Released: 02/02/2005 Document Revised: 03/29/2011 Document Reviewed: 04/26/2005 [facility] Patient Information 2012 [place], [place].

## 2011-07-13 NOTE — Progress Notes (Signed)
Patient ID: [name], male   DOB: [birth year], 60 y.o.   MRN: [id number]                    [address]            [place] [address]          [phone]       [name] [facility] Medical Record #[id number] Date of Birth: [birth year]  Referring: Micheline Chapman, MD Primary Care: Marye Round, MD  Chief Complaint:    Chief Complaint  Patient presents with  . Aortic Stenosis    Referal for Dr Excell Seltzer for surgical eval on aortic stenosis, cathed  07/11/11, Echo 07/13/11    History of Present Illness:     Patient is a 60 year old male who's had a known aortic murmur since age 44 followed intermittently over the years. In October 2012 he noted increasing shortness of breath with increasing activity. In at has had increasing fatigue with exertion he denies chest pain denies syncope or presyncope he said no peripheral edema. He does have a known dilated ascending aorta since CT scan done in 2009 up to 4.1 cm. He's been told in the past that he has a bicuspid aortic valve.   Current Activity/ Functional Status: Patient is  independent with mobility/ambulation, transfers, ADL's, IADL's.   Past Medical History  Diagnosis Date  . Hypertension   . Hyperlipidemia   . Depression   . PVD (peripheral vascular disease)   . Femoral-popliteal bypass graft occlusion, right 1997    Dr. Hart [place] with vein from rt leg known to be occluded  . Claudication in peripheral vascular disease right greater left   . Aortic stenosis 2009    ef 40-45%     Past Surgical History  Procedure Date  . Cardiac catheterization 2009    Dr. Eldridge Dace    History  Smoking status  . Never Smoker   Smokeless tobacco  . Not on file   History  Alcohol Use No    History   Social History  . Marital Status: Married   Occupational History  . Works at [employer] History Main Topics  . Smoking status: Never Smoker   . Smokeless tobacco:   . Alcohol Use: No    .    .                   Allergies  Allergen Reactions  . Codeine Other (See Comments)    Blood drops    Current Outpatient Prescriptions  Medication Sig Dispense Refill  . citalopram (CELEXA) 40 MG tablet Take 40 mg by mouth daily.        . enalapril (VASOTEC) 5 MG tablet Take 5 mg by mouth daily.        [name] aspirin EC 81 MG tablet Take 81 mg by mouth daily.           Family History: no history of aortic aneurysms or aortic dissections or sudden death. Patient's mother died with diabetes and circulatory problems father died of lung cancer patient's sister died of breast cancer at age 15  ROS  Review of Systems:     Cardiac Review of Systems: Y or N  Chest Pain [  n  ]  Resting SOB [ n  ] Exertional SOB  [ y ]  Pollyann [place].Etienne  ]   Pedal Edema [ n  ]  Palpitations [n  ] Syncope  [name].Brash  ]   Presyncope [ n  ]  General Review of Systems: [Y] = yes [  ]=no Constitional: recent weight change [n  ]; anorexia [  n]; fatigue [ n ]; nausea [  n]; night sweats [  ]; fever [  ]; or chills [  ];                                                                                                                                          Dental: poor dentition[n  ]; Last Dentist visit: one year ago has consistent dental care  Eye : blurred vision [  ]; diplopia [   ]; vision changes [  ];  Amaurosis fugax[  ]; Resp: cough [  ];  wheezing[  ];  hemoptysis[  ]; shortness of breath[  ]; paroxysmal nocturnal dyspnea[  ]; dyspnea on exertion[  ]; or orthopnea[  ];  GI:  gallstones[  ], vomiting[  ];  dysphagia[  ]; melena[  ];  hematochezia [  ]; heartburn[  ];   Hx of  Colonoscopy[ y ]; colonoscopy was done 3 years ago GU: kidney stones [  ]; hematuria[  ];   dysuria [  ];  nocturia[  ];  history of     obstruction [  ];             Skin: rash, swelling[  ];, hair loss[  ];  peripheral edema[  ];  or itching[  ]; Musculosketetal: myalgias[  ];  joint swelling[  ];  joint erythema[  ];  joint pain[  ];   back pain[  ];  Heme/Lymph: bruising[  ];  bleeding[  ];  anemia[  ];  Neuro: TIA[  ];  headaches[  ];  stroke[n  ];  vertigo[  ];  seizures[  ];   paresthesias[  ];  difficulty walking[  ];  Psych:depression[  ]; anxiety[  ];  Endocrine: diabetes[  ];  thyroid dysfunction[  ];  Immunizations: Flu [ y ]; Pneumococcal[n ];  Other: claudication right leg  Physical Exam: BP 125/79  Pulse 68  Resp 18  Ht 6' (1.829 m)  Wt 217 lb (98.431 kg)  BMI 29.43 kg/m2  SpO2 98%  General appearance: alert, cooperative, appears older than stated age and no distress Neurologic: intact  Heart: prominent apical impulse, regular rate and rhythm and systolic murmur: holosystolic 3/6, crescendo at 2nd right intercostal space Lungs: clear to auscultation bilaterally Abdomen: soft, non-tender; bowel sounds normal; no masses,  no organomegaly Extremities: extremities normal, atraumatic, no cyanosis or edema, no edema, redness or tenderness in the calves or thighs and Patient has no palpable pulses in the right ankle 1+ DP and PT on the left   Diagnostic Studies & Laboratory data:     Recent Radiology Findings:   No results found.  Recent Lab Findings: Lab Results  Component Value Date   WBC 7.6 07/06/2011   HGB 15.2 07/06/2011   HCT 44.4 07/06/2011   PLT 233.0 07/06/2011   GLUCOSE 85 07/06/2011   CHOL  Value: 280        ATP III CLASSIFICATION:  <200     mg/dL   Desirable  045-409  mg/dL   Borderline High  >=811    mg/dL   High* 91/10/7827   TRIG 155* 07/07/2008   HDL 35* 07/07/2008   LDLCALC  Value: 214        Total Cholesterol/HDL:CHD Risk Coronary Heart Disease Risk Table                     Men   Women  1/2 Average Risk   3.4   3.3* 07/07/2008   ALT 36 07/07/2008   AST 33 07/07/2008   NA 137 07/06/2011   K 4.2 07/06/2011   CL 102 07/06/2011   CREATININE 1.1 07/06/2011   BUN 14 07/06/2011   CO2 27 07/06/2011   TSH 1.243 Test methodology is 3rd generation TSH 07/07/2008   INR 1.0 07/06/2011   HGBA1C   Value: 6.2 (NOTE)   The ADA recommends the following therapeutic goal for glycemic   control related to Hgb A1C measurement:   Goal of Therapy:   < 7.0% Hgb A1C   Reference: American Diabetes Association: Clinical Practice   Recommendations 2008, Diabetes Care,  2008, 31:(Suppl 1).* 07/07/2008   Cardiac Catheterization Procedure Note  Name: [name]  MRN: [id number]  DOB: May 26, 1951  Procedure: Right Heart Cath, Left Heart Cath, Selective Coronary Angiography, LV angiography  Indication: A 60 year old gentleman referred for right and left heart catheterization in the setting of class III angina and exertional dyspnea. He has a known bicuspid aortic valve and his physical exam is consistent with severe aortic stenosis. He has not had a recent echocardiogram.  Procedural Details: The right groin was prepped, draped, and anesthetized with 1% lidocaine. Using the modified Seldinger technique a 6 French sheath was placed in the right femoral artery and a 6 French sheath was placed in the right femoral vein. A multipurpose catheter was used for the right heart catheterization. Standard protocol was followed for recording of right heart pressures and sampling of oxygen saturations. Fick cardiac output was calculated. Standard Judkins catheters were used for selective coronary angiography and left ventriculography. An exchange length straight tip wire was used to cross the aortic valve. The wire was directed with an AL- 2 catheter. This was changed out for a Langston pigtail catheter for simultaneous LV and aortic pressure recording. There were no immediate procedural complications. The patient was transferred to the post catheterization recovery area for further monitoring.  Procedural Findings:  Hemodynamics  RA 3  RV 26/6  PA 24/8 with a mean of 14  PCWP 8  LV 173/21  AO 124/72 with a mean of 93  Oxygen saturations:  PA 64%  AO 94%  Cardiac Output (Fick) 4.5 L per minute  Cardiac Index (Fick) 2  L/minute/meter square  Aortic valve hemodynamics:  Mean gradient 42, aortic valve area 0.76, aortic valve area index 0.34  Coronary angiography:  Coronary dominance: right  Left mainstem: Widely patent with no significant obstructive disease. There is a 20-30% stenosis in the mid shaft of the left main.  Left anterior descending (LAD): The LAD is a large-caliber vessel. The proximal LAD is tortuous. The mid LAD at the  origin of the second diagonal branch has a 30-40% stenosis. The apical portion of the LAD is small in caliber. There are no high-grade stenoses throughout the course of the LAD.  Left circumflex (LCx): The left circumflex has 50-60% ostial stenosis. The mid circumflex has 80% stenosis leading into a large second OM branch. The first obtuse marginal was very small in caliber.  Right coronary artery (RCA): The RCA is dominant. It is an ectatic vessel with diffuse irregularity but there are no areas of high-grade stenosis identified. The proximal RCA has 30% stenosis. The distal RCA just before the origin of the PDA branch has a 50% stenosis. The PDA and posterolateral branches have mild diffuse disease without focal high-grade stenosis.  Left ventriculography: Left ventricular systolic function is in the low-normal range. The left ventricular ejection fraction is estimated at 50-55%. There is no significant mitral regurgitation identified.  Final Conclusions:  1. Severe bicuspid valve aortic stenosis  2. Severe left circumflex stenosis  3. Mild nonobstructive LAD and right coronary artery stenoses  4. Essentially normal left ventricular systolic function with an estimated left ventricular ejection fraction of 50-55%  5. Essentially normal right heart hemodynamics  Recommendations: [facility] consultation for consideration of aortic valve replacement and bypass of the left circumflex (obtuse marginal branch).  [name]  07/11/2011, 2:01  PM  Echocardiography  (Report amended )  Patient: [name], [name] MR #: [id number] Study Date: 07/13/2011 Gender: M Age: 56 Height: 182.9cm Weight: 98.6kg BSA: 2.49m^2 Pt. Status: Room:  ATTENDING [name], MD ORDERING [name], MD REFERRING [name], MD PERFORMING [facility], [facility] SONOGRAPHER Junious Dresser, RDCS cc:  ------------------------------------------------------------ LV EF: 55% - 60%  ------------------------------------------------------------ Indications: Biscuspid aortic valve 746.4. Aortic stenosis 424.1.  ------------------------------------------------------------ History: PMH: Acquired from the patient and from the patient's chart. Chest discomfort. Dyspnea, exertional dyspnea, and 2/6 systolic murmur. Bicuspid aortic valve. Coronary artery disease. Moderate aortic stenosis. Risk factors: Hypertension. Dyslipidemia.  ------------------------------------------------------------ Study Conclusions  - Left ventricle: The cavity size was normal. Wall thickness was increased in a pattern of mild LVH. There was mild concentric hypertrophy. Systolic function was normal. The estimated ejection fraction was in the range of 55% to 60%. Wall motion was normal; there were no regional wall motion abnormalities. Doppler parameters are consistent with abnormal left ventricular relaxation (grade 1 diastolic dysfunction). - Aortic valve: Probable bicuspid valve There was severe stenosis. Mild regurgitation. Mean gradient: 55mm Hg (S). Peak gradient: 99mm Hg (S).  ------------------------------------------------------------ Labs, prior tests, procedures, and surgery: Echocardiography (2009). The aortic valve showed moderate stenosis. EF was 60%. Aortic valve: peak gradient of 32mm Hg and mean gradient of 19mm Hg.  Catheterization (December 2012). The study demonstrated coronary artery disease and significant aortic valve disease. The  aortic valve showed severe stenosis. There was an 80%stenosis in the left circumflex coronary artery. EF was 55%. Aortic valve: mean gradient of 42mm Hg and area of 0.8cm^2. Echocardiography. M-mode, complete 2D, spectral Doppler, and color Doppler. Height: Height: 182.9cm. Height: 72in. Weight: Weight: 98.6kg. Weight: 217lb. Body mass index: BMI: 29.5kg/m^2. Body surface area: BSA: 2.44m^2. Blood pressure: 125/102. Patient status: Outpatient. Location: [place] [facility]  ------------------------------------------------------------  ------------------------------------------------------------ Left ventricle: The cavity size was normal. Wall thickness was increased in a pattern of mild LVH. There was mild concentric hypertrophy. Systolic function was normal. The estimated ejection fraction was in the range of 55% to 60%. Wall motion was normal; there were no regional wall motion abnormalities. Doppler parameters are  consistent with abnormal left ventricular relaxation (grade 1 diastolic dysfunction).  ------------------------------------------------------------ Aortic valve: Probable bicuspid valve Probably trileaflet; severely calcified leaflets. Doppler: There was severe stenosis. Mild regurgitation. VTI ratio of LVOT to aortic valve: 0.18. Valve area: 0.77cm^2(VTI). Indexed valve area: 0.34cm^2/m^2 (VTI). Peak velocity ratio of LVOT to aortic valve: 0.15. Valve area: 0.61cm^2 (Vmax). Indexed valve area: 0.27cm^2/m^2 (Vmax). Mean gradient: 55mm Hg (S). Peak gradient: 99mm Hg (S).  ------------------------------------------------------------ Aorta: The aorta was normal, not dilated, and non-diseased. Ascending aorta: The ascending aorta was mildly dilated.  ------------------------------------------------------------ Mitral valve: Structurally normal valve. Leaflet separation was normal. Doppler: Transvalvular velocity was within the normal range. There was no evidence for  stenosis. Trivial regurgitation.  ------------------------------------------------------------ Left atrium: The atrium was at the upper limits of normal in size.  ------------------------------------------------------------ Right ventricle: The cavity size was normal. Wall thickness was normal. Systolic function was normal.  ------------------------------------------------------------ Pulmonic valve: The valve appears to be grossly normal. Doppler: No significant regurgitation.  ------------------------------------------------------------ Tricuspid valve: Structurally normal valve. Leaflet separation was normal. Doppler: Transvalvular velocity was within the normal range. No regurgitation.  ------------------------------------------------------------ Pulmonary artery: The main pulmonary artery was normal-sized.  ------------------------------------------------------------ Right atrium: The atrium was normal in size.  ------------------------------------------------------------ Pericardium: The pericardium was normal in appearance.  ------------------------------------------------------------ Systemic veins: Inferior vena cava: The vessel was normal in size; the respirophasic diameter changes were in the normal range (= 50%); findings are consistent with normal central venous pressure.  ------------------------------------------------------------ Post procedure conclusions Ascending Aorta:  - The aorta was normal, not dilated, and non-diseased.  ------------------------------------------------------------  2D measurements Normal Doppler measurements Normal Left ventricle Left ventricle LVID ED, 43.2 mm 43-52 Ea, lat 5.76 cm/s ------ chord, ann, PLAX tiss DP LVID ES, 31.6 mm 23-38 E/Ea, 11.67 ------ chord, lat ann, PLAX tiss DP FS, chord, 27 % >29 Ea, med 6.59 cm/s ------ PLAX ann, LVPW, ED 13.2 mm ------ tiss DP IVS/LVPW 0.94 <1.3 E/Ea, 10.2 ------ ratio, ED  med ann, Ventricular septum tiss DP IVS, ED 12.4 mm ------ LVOT LVOT Peak 72.6 cm/s ------ Diam, S 23.1 mm ------ vel, S Area 4.19 cm^2 ------ VTI, S 21.3 cm ------ Diam 23 mm ------ Stroke 88.5 ml ------ Aorta vol Root diam, 35.6 mm ------ Stroke 39.2 ml/m^2 ------ ED index AAo AP 40.6 mm ------ Aortic valve diam, S Peak 498 cm/s ------ Left atrium vel, S AP dim 37.7 mm ------ Mean 331 cm/s ------ AP dim 1.67 cm/m^2 <2.2 vel, S index VTI, S 116 cm ------ Mean 55 mm Hg ------ gradient , S Peak 99 mm Hg ------ gradient , S VTI 0.18 ------ ratio LVOT/AV Area, 0.77 cm^2 ------ VTI Area 0.34 cm^2/m^ ------ index 2 (VTI) Peak vel 0.15 ------ ratio, LVOT/AV Area, 0.61 cm^2 ------ Vmax Area 0.27 cm^2/m^ ------ index 2 (Vmax) Mitral valve Peak E 67.2 cm/s ------ vel Peak A 57.6 cm/s ------ vel Peak E/A 1.17 ------ ratio Right ventricle Sa vel, 11.4 cm/s ------ lat ann, tiss DP  ------------------------------------------------------------ Godfrey Pick, Peter 2012-12-13T14:33:21.020  CT ANGIOGRAPHY CHEST  12/7/2009Technique: Multidetector CT imaging of the chest using the  standard protocol during bolus administration of intravenous  contrast. Multiplanar reconstructed images including MIPs were  obtained and reviewed to evaluate the vascular anatomy.  Contrast: 80 ml Omnipaque-300.  Comparison: Chest radiograph 07/06/2008  Findings: Three-vessel aortic arch identified. Fatty liver is  noted on incidental imaging of the upper abdomen. No axillary  adenopathy. Mildly prominent mediastinal lymph nodes are present  which are not pathologically  enlarged. 1.2 cm right hilar lymph  node is present. Small paratracheal lymph nodes are noted.  Coronary artery atherosclerosis is present. If office based  assessment of coronary risk factors has not been performed, it is  now recommended. . Aortic valvular calcifications are present.  Ectasia of the ascending aorta.  Maximal measurement of the  ascending aorta is 4.1 cm when measured at the level of the  pulmonary artery. Descending thoracic aorta is within normal  limits.  The study is technically adequate to evaluate for pulmonary  embolus. No pulmonary embolus is present.  Bone windows demonstrate right sternoclavicular joint  osteoarthritis with subchondral cystic change. Partially  visualized large left upper pole low density renal lesion with  maximal diameter 4.1 cm.  Lung windows show interlobular septal thickening which is apical  predominant. Several small areas of nodularity are present. On  image number 20 6 x 6 mm peripheral pulmonary nodules present in  the right upper lobe. Other smaller ill-defined pulmonary nodules  are present along with some ground-glass attenuation.  Tiny bilateral pleural effusions are present.  IMPRESSION:  1. Technically adequate study without pulmonary embolus.  2. Interlobular septal thickening and scattered areas of ground-  glass attenuation are most consistent with volume overload and  congestive heart failure, possibly related to aortic valvular  disease. Tiny bilateral pleural effusions.  3. Ectasia of the ascending aorta, also possibly related to aortic  valvular disease.  4. Follow-up chest CT recommended in 3-6 months depending on  patient risk factors to reassess for persistence of pulmonary  nodules. These may represent focal areas of early alveolar edema  in this patient with congestive change. This recommendation follows  the consensus statement: Guidelines for Management of Small  Pulmonary Nodules Detected on CT Scans: A Statement from the  Fleischner Society as published in Radiology 2005; 237:395-400.  Available online at: http://www.copeland.com/-  nodule.htm.  5. Fatty liver.  Provider: Dicie Beam    Assessment / Plan:   Patient with symptomatic critical aortic  stenosis mild aortic insufficiency  with dilated  ascending aorta to 4.1 cm in 2009. History of peripheral vascular disease status post right vein femoral-popliteal bypass now occluded  Coronary occlusive disease with significant disease in the circumflex coronary artery  With the patient's symptomatic critical aortic stenosis and coronary occlusive disease I agree with Dr. Excell Seltzer that we should proceed with aortic valve replacement and coronary artery bypass grafting. Prior to this we'll repeat CT scan to reevaluate the size of his aorta at this time compared to 2009 and also to followup on potential nodules that were noted on the previous CT.  I discussed with the patient in detail the replacement of aortic valve with a tissue versus mechanical valve. The long-term risks of Coumadin versus potential for redo surgery in the future has also been discussed. The patient prefers a tissue valve.  The goals risks and alternatives of the planned surgical procedure AVR CABG possible replacement of Ascending Aorta have been discussed with the patient in detail. The risks of the procedure including death, infection, stroke, myocardial infarction, bleeding, blood transfusion have all been discussed specifically.  I have quoted Norton Blizzard a 3 % of perioperative mortality and a complication rate as high as 20 %. The patient's questions have been answered.[facility]AWAN [facility]UGH is willing  to proceed with the planned procedure.  We'll plan to proceed on December 18,2012   Delight Ovens MD  Beeper [phone] Office [phone] 07/13/2011 6:51 PM

## 2011-07-13 NOTE — Pre-Procedure Instructions (Signed)
20 [name]  07/13/2011   Your procedure is scheduled on:  December 18  Report to [facility] Short Stay Center at 5:30 AM.  Call this number if you have problems the morning of surgery: [phone]   Remember:   Do not eat food:After Midnight.  May have clear liquids: up to 4 Hours before arrival.  Clear liquids include soda, tea, black coffee, apple or grape juice, broth.  Take these medicines the morning of surgery with A SIP OF WATER: Celexa   Do not wear jewelry, make-up or nail polish.  Do not wear lotions, powders, or perfumes. You may wear deodorant.  Do not shave 48 hours prior to surgery.  Do not bring valuables to the hospital.  Contacts, dentures or bridgework may not be worn into surgery.  Leave suitcase in the car. After surgery it may be brought to your room.  For patients admitted to the hospital, checkout time is 11:00 AM the day of discharge.   Patients discharged the day of surgery will not be allowed to drive home.  Name and phone number of your driver: NA  Special Instructions: Incentive Spirometry - Practice and bring it with you on the day of surgery. and CHG Shower Use Special Wash: 1/2 bottle night before surgery and 1/2 bottle morning of surgery.   Please read over the following fact sheets that you were given: Pain Booklet, Coughing and Deep Breathing, Blood Transfusion Information, Open Heart Packet and Surgical Site Infection Prevention

## 2011-07-14 ENCOUNTER — Encounter (HOSPITAL_COMMUNITY)
Admission: RE | Admit: 2011-07-14 | Discharge: 2011-07-14 | Disposition: A | Discharge status: Home / Self Care | Payer: 59 | Source: Ambulatory Visit | Attending: Cardiothoracic Surgery | Admitting: Cardiothoracic Surgery

## 2011-07-14 ENCOUNTER — Other Ambulatory Visit: Payer: Self-pay

## 2011-07-14 ENCOUNTER — Other Ambulatory Visit (HOSPITAL_COMMUNITY): Payer: Self-pay | Admitting: Radiology

## 2011-07-14 ENCOUNTER — Inpatient Hospital Stay (HOSPITAL_COMMUNITY)
Admission: RE | Admit: 2011-07-14 | Discharge: 2011-07-14 | Disposition: A | Discharge status: Home / Self Care | Payer: 59 | Source: Ambulatory Visit | Attending: Cardiothoracic Surgery | Admitting: Cardiothoracic Surgery

## 2011-07-14 ENCOUNTER — Ambulatory Visit (HOSPITAL_COMMUNITY)
Admission: RE | Admit: 2011-07-14 | Discharge: 2011-07-14 | Disposition: A | Discharge status: Home / Self Care | Payer: 59 | Source: Ambulatory Visit | Attending: Cardiothoracic Surgery | Admitting: Cardiothoracic Surgery

## 2011-07-14 ENCOUNTER — Ambulatory Visit
Admission: RE | Admit: 2011-07-14 | Discharge: 2011-07-14 | Disposition: A | Discharge status: Home / Self Care | Payer: 59 | Source: Ambulatory Visit | Attending: Cardiothoracic Surgery | Admitting: Cardiothoracic Surgery

## 2011-07-14 ENCOUNTER — Encounter (HOSPITAL_COMMUNITY): Payer: Self-pay

## 2011-07-14 DIAGNOSIS — Z01818 Encounter for other preprocedural examination: Secondary | ICD-10-CM | POA: Insufficient documentation

## 2011-07-14 DIAGNOSIS — Z01811 Encounter for preprocedural respiratory examination: Secondary | ICD-10-CM | POA: Insufficient documentation

## 2011-07-14 DIAGNOSIS — I350 Nonrheumatic aortic (valve) stenosis: Secondary | ICD-10-CM

## 2011-07-14 DIAGNOSIS — Z01812 Encounter for preprocedural laboratory examination: Secondary | ICD-10-CM | POA: Insufficient documentation

## 2011-07-14 DIAGNOSIS — I712 Thoracic aortic aneurysm, without rupture: Secondary | ICD-10-CM

## 2011-07-14 DIAGNOSIS — Z01810 Encounter for preprocedural cardiovascular examination: Secondary | ICD-10-CM | POA: Insufficient documentation

## 2011-07-14 DIAGNOSIS — I2510 Atherosclerotic heart disease of native coronary artery without angina pectoris: Secondary | ICD-10-CM

## 2011-07-14 DIAGNOSIS — I359 Nonrheumatic aortic valve disorder, unspecified: Secondary | ICD-10-CM

## 2011-07-14 DIAGNOSIS — I739 Peripheral vascular disease, unspecified: Secondary | ICD-10-CM

## 2011-07-14 HISTORY — DX: Headache: R51

## 2011-07-14 HISTORY — DX: Other specified postprocedural states: R11.2

## 2011-07-14 HISTORY — DX: Atherosclerotic heart disease of native coronary artery without angina pectoris: I25.10

## 2011-07-14 HISTORY — DX: Nausea with vomiting, unspecified: Z98.890

## 2011-07-14 HISTORY — DX: Gastro-esophageal reflux disease without esophagitis: K21.9

## 2011-07-14 HISTORY — DX: Unspecified osteoarthritis, unspecified site: M19.90

## 2011-07-14 LAB — URINALYSIS, ROUTINE W REFLEX MICROSCOPIC
Bilirubin Urine: NEGATIVE
Glucose, UA: NEGATIVE mg/dL
Ketones, ur: NEGATIVE mg/dL
Leukocytes, UA: NEGATIVE
Nitrite: NEGATIVE
Protein, ur: NEGATIVE mg/dL
Specific Gravity, Urine: 1.042 — ABNORMAL HIGH (ref 1.005–1.030)
Urobilinogen, UA: 1 mg/dL (ref 0.0–1.0)
pH: 5 (ref 5.0–8.0)

## 2011-07-14 LAB — COMPREHENSIVE METABOLIC PANEL
ALT: 30 U/L (ref 0–53)
AST: 32 U/L (ref 0–37)
Albumin: 4 g/dL (ref 3.5–5.2)
Alkaline Phosphatase: 55 U/L (ref 39–117)
BUN: 17 mg/dL (ref 6–23)
CO2: 27 mEq/L (ref 19–32)
Calcium: 9.7 mg/dL (ref 8.4–10.5)
Chloride: 102 mEq/L (ref 96–112)
Creatinine, Ser: 1.18 mg/dL (ref 0.50–1.35)
GFR calc Af Amer: 77 mL/min — ABNORMAL LOW (ref >90)
GFR calc non Af Amer: 66 mL/min — ABNORMAL LOW (ref >90)
Glucose, Bld: 86 mg/dL (ref 70–99)
Potassium: 4.1 mEq/L (ref 3.5–5.1)
Sodium: 139 mEq/L (ref 135–145)
Total Bilirubin: 0.4 mg/dL (ref 0.3–1.2)
Total Protein: 7.4 g/dL (ref 6.0–8.3)

## 2011-07-14 LAB — CBC
HCT: 45.9 % (ref 39.0–52.0)
Hemoglobin: 15.7 g/dL (ref 13.0–17.0)
MCH: 30.7 pg (ref 26.0–34.0)
MCHC: 34.2 g/dL (ref 30.0–36.0)
MCV: 89.8 fL (ref 78.0–100.0)
Platelets: 188 10*3/uL (ref 150–400)
RBC: 5.11 MIL/uL (ref 4.22–5.81)
RDW: 12.7 % (ref 11.5–15.5)
WBC: 6.5 10*3/uL (ref 4.0–10.5)

## 2011-07-14 LAB — PROTIME-INR
INR: 0.94 (ref 0.00–1.49)
Prothrombin Time: 12.8 seconds (ref 11.6–15.2)

## 2011-07-14 LAB — APTT: aPTT: 31 seconds (ref 24–37)

## 2011-07-14 LAB — URINE MICROSCOPIC-ADD ON

## 2011-07-14 LAB — BLOOD GAS, ARTERIAL
Acid-Base Excess: 0.5 mmol/L (ref 0.0–2.0)
Bicarbonate: 24 mEq/L (ref 20.0–24.0)
Drawn by: 344381
FIO2: 0.21 %
O2 Saturation: 96.4 %
Patient temperature: 98.6
TCO2: 25.1 mmol/L (ref 0–100)
pCO2 arterial: 34.7 mmHg — ABNORMAL LOW (ref 35.0–45.0)
pH, Arterial: 7.454 — ABNORMAL HIGH (ref 7.350–7.450)
pO2, Arterial: 77.9 mmHg — ABNORMAL LOW (ref 80.0–100.0)

## 2011-07-14 LAB — HEMOGLOBIN A1C
Hgb A1c MFr Bld: 5.8 % — ABNORMAL HIGH (ref <5.7)
Mean Plasma Glucose: 120 mg/dL — ABNORMAL HIGH (ref <117)

## 2011-07-14 LAB — SURGICAL PCR SCREEN
MRSA, PCR: NEGATIVE
Staphylococcus aureus: POSITIVE — AB

## 2011-07-14 LAB — PULMONARY FUNCTION TEST

## 2011-07-14 LAB — TYPE AND SCREEN
ABO/RH(D): A POS
Antibody Screen: NEGATIVE

## 2011-07-14 LAB — ABO/RH: ABO/RH(D): A POS

## 2011-07-14 MED ORDER — IOHEXOL 300 MG/ML  SOLN
100.0000 mL | Freq: Once | INTRAMUSCULAR | Status: AC | PRN
  Administered 2011-07-14: 100 mL via INTRAVENOUS

## 2011-07-14 MED ORDER — ALBUTEROL SULFATE (5 MG/ML) 0.5% IN NEBU
2.5000 mg | INHALATION_SOLUTION | Freq: Once | RESPIRATORY_TRACT | Status: AC
  Administered 2011-07-14: 2.5 mg via RESPIRATORY_TRACT

## 2011-07-14 NOTE — Progress Notes (Signed)
Pre CABG Dopplers completed at 16:00.  Preliminary report is no evidence of significant ICA stenosis.  Vertebral artery flow is antegrade.  ABI is 0.65 on the right and 0.94 on the left. Smiley Houseman 07/14/2011, 4:34 PM

## 2011-07-17 MED ORDER — METOPROLOL TARTRATE 12.5 MG HALF TABLET
12.5000 mg | ORAL_TABLET | Freq: Once | ORAL | Status: DC
  Filled 2011-07-17: qty 1

## 2011-07-17 MED ORDER — PHENYLEPHRINE HCL 10 MG/ML IJ SOLN
30.0000 ug/min | INTRAVENOUS | Status: DC
  Administered 2011-07-18: 5 ug/min via INTRAVENOUS
  Filled 2011-07-17: qty 2

## 2011-07-17 MED ORDER — POTASSIUM CHLORIDE 2 MEQ/ML IV SOLN
80.0000 meq | INTRAVENOUS | Status: DC
  Filled 2011-07-17: qty 40

## 2011-07-17 MED ORDER — SODIUM CHLORIDE 0.9 % IV SOLN
INTRAVENOUS | Status: DC
  Filled 2011-07-17: qty 40

## 2011-07-17 MED ORDER — SODIUM CHLORIDE 0.9 % IV SOLN
0.1000 ug/kg/h | INTRAVENOUS | Status: AC
  Administered 2011-07-18: .3 ug/kg/h via INTRAVENOUS
  Filled 2011-07-17: qty 4

## 2011-07-17 MED ORDER — NITROGLYCERIN IN D5W 200-5 MCG/ML-% IV SOLN
2.0000 ug/min | INTRAVENOUS | Status: DC
  Filled 2011-07-17: qty 250

## 2011-07-17 MED ORDER — DOPAMINE-DEXTROSE 3.2-5 MG/ML-% IV SOLN
2.0000 ug/kg/min | INTRAVENOUS | Status: DC
  Filled 2011-07-17: qty 250

## 2011-07-17 MED ORDER — DEXTROSE 5 % IV SOLN
1.5000 g | INTRAVENOUS | Status: AC
  Administered 2011-07-18: .75 g via INTRAVENOUS
  Administered 2011-07-18: 1.5 g via INTRAVENOUS
  Filled 2011-07-17: qty 1.5

## 2011-07-17 MED ORDER — MAGNESIUM SULFATE 50 % IJ SOLN
40.0000 meq | INTRAMUSCULAR | Status: DC
  Filled 2011-07-17: qty 10

## 2011-07-17 MED ORDER — CHLORHEXIDINE GLUCONATE 4 % EX LIQD: 30.0000 mL | CUTANEOUS | Status: DC

## 2011-07-17 MED ORDER — DEXTROSE 5 % IV SOLN
750.0000 mg | INTRAVENOUS | Status: DC
  Filled 2011-07-17: qty 750

## 2011-07-17 MED ORDER — EPINEPHRINE HCL 1 MG/ML IJ SOLN
0.5000 ug/min | INTRAVENOUS | Status: DC
  Filled 2011-07-17: qty 4

## 2011-07-17 MED ORDER — PLASMA-LYTE 148 IV SOLN
INTRAVENOUS | Status: AC
  Administered 2011-07-18: 10:00:00
  Filled 2011-07-17: qty 0.5

## 2011-07-17 MED ORDER — SODIUM CHLORIDE 0.9 % IV SOLN
INTRAVENOUS | Status: AC
  Administered 2011-07-18: 1.3 [IU]/h via INTRAVENOUS
  Filled 2011-07-17: qty 1

## 2011-07-17 MED ORDER — SODIUM CHLORIDE 0.9 % IV SOLN
1250.0000 mg | INTRAVENOUS | Status: AC
  Administered 2011-07-18: 1250 mg via INTRAVENOUS
  Filled 2011-07-17: qty 1250

## 2011-07-18 ENCOUNTER — Ambulatory Visit (HOSPITAL_COMMUNITY): Payer: 59

## 2011-07-18 ENCOUNTER — Other Ambulatory Visit: Payer: Self-pay | Admitting: Cardiothoracic Surgery

## 2011-07-18 ENCOUNTER — Inpatient Hospital Stay (HOSPITAL_COMMUNITY): Payer: 59

## 2011-07-18 ENCOUNTER — Encounter (HOSPITAL_COMMUNITY): Payer: Self-pay

## 2011-07-18 ENCOUNTER — Encounter (HOSPITAL_COMMUNITY)
Admission: RE | Disposition: A | Discharge status: Home / Self Care | Payer: Self-pay | Source: Ambulatory Visit | Attending: Cardiothoracic Surgery

## 2011-07-18 ENCOUNTER — Inpatient Hospital Stay (HOSPITAL_COMMUNITY)
Admission: RE | Admit: 2011-07-18 | Discharge: 2011-07-27 | DRG: 220 | Disposition: A | Discharge status: Home / Self Care | Payer: 59 | Source: Ambulatory Visit | Attending: Cardiothoracic Surgery | Admitting: Cardiothoracic Surgery

## 2011-07-18 ENCOUNTER — Encounter: Payer: Self-pay | Admitting: Cardiothoracic Surgery

## 2011-07-18 DIAGNOSIS — Z7901 Long term (current) use of anticoagulants: Secondary | ICD-10-CM

## 2011-07-18 DIAGNOSIS — D696 Thrombocytopenia, unspecified: Secondary | ICD-10-CM | POA: Diagnosis not present

## 2011-07-18 DIAGNOSIS — E8779 Other fluid overload: Secondary | ICD-10-CM | POA: Diagnosis not present

## 2011-07-18 DIAGNOSIS — I350 Nonrheumatic aortic (valve) stenosis: Secondary | ICD-10-CM

## 2011-07-18 DIAGNOSIS — I4891 Unspecified atrial fibrillation: Secondary | ICD-10-CM | POA: Diagnosis not present

## 2011-07-18 DIAGNOSIS — Z7982 Long term (current) use of aspirin: Secondary | ICD-10-CM

## 2011-07-18 DIAGNOSIS — E785 Hyperlipidemia, unspecified: Secondary | ICD-10-CM | POA: Diagnosis present

## 2011-07-18 DIAGNOSIS — I739 Peripheral vascular disease, unspecified: Secondary | ICD-10-CM | POA: Diagnosis present

## 2011-07-18 DIAGNOSIS — K219 Gastro-esophageal reflux disease without esophagitis: Secondary | ICD-10-CM | POA: Diagnosis present

## 2011-07-18 DIAGNOSIS — I209 Angina pectoris, unspecified: Secondary | ICD-10-CM | POA: Diagnosis present

## 2011-07-18 DIAGNOSIS — F3289 Other specified depressive episodes: Secondary | ICD-10-CM | POA: Diagnosis present

## 2011-07-18 DIAGNOSIS — I10 Essential (primary) hypertension: Secondary | ICD-10-CM | POA: Diagnosis present

## 2011-07-18 DIAGNOSIS — M109 Gout, unspecified: Secondary | ICD-10-CM | POA: Diagnosis not present

## 2011-07-18 DIAGNOSIS — D62 Acute posthemorrhagic anemia: Secondary | ICD-10-CM | POA: Diagnosis not present

## 2011-07-18 DIAGNOSIS — M129 Arthropathy, unspecified: Secondary | ICD-10-CM | POA: Diagnosis present

## 2011-07-18 DIAGNOSIS — I2510 Atherosclerotic heart disease of native coronary artery without angina pectoris: Secondary | ICD-10-CM | POA: Diagnosis present

## 2011-07-18 DIAGNOSIS — Z6829 Body mass index (BMI) 29.0-29.9, adult: Secondary | ICD-10-CM

## 2011-07-18 DIAGNOSIS — I359 Nonrheumatic aortic valve disorder, unspecified: Secondary | ICD-10-CM

## 2011-07-18 DIAGNOSIS — Z79899 Other long term (current) drug therapy: Secondary | ICD-10-CM

## 2011-07-18 DIAGNOSIS — F329 Major depressive disorder, single episode, unspecified: Secondary | ICD-10-CM | POA: Diagnosis present

## 2011-07-18 HISTORY — PX: CORONARY ARTERY BYPASS GRAFT: SHX141

## 2011-07-18 HISTORY — PX: AORTIC VALVE REPLACEMENT: SHX41

## 2011-07-18 LAB — HEMOGLOBIN AND HEMATOCRIT, BLOOD
HCT: 35.9 % — ABNORMAL LOW (ref 39.0–52.0)
Hemoglobin: 12.3 g/dL — ABNORMAL LOW (ref 13.0–17.0)

## 2011-07-18 LAB — POCT I-STAT 4, (NA,K, GLUC, HGB,HCT)
Glucose, Bld: 102 mg/dL — ABNORMAL HIGH (ref 70–99)
Glucose, Bld: 109 mg/dL — ABNORMAL HIGH (ref 70–99)
Glucose, Bld: 118 mg/dL — ABNORMAL HIGH (ref 70–99)
Glucose, Bld: 86 mg/dL (ref 70–99)
Glucose, Bld: 151 mg/dL — ABNORMAL HIGH (ref 70–99)
Glucose, Bld: 116 mg/dL — ABNORMAL HIGH (ref 70–99)
Glucose, Bld: 106 mg/dL — ABNORMAL HIGH (ref 70–99)
HCT: 43 % (ref 39.0–52.0)
HCT: 39 % (ref 39.0–52.0)
HCT: 27 % — ABNORMAL LOW (ref 39.0–52.0)
HCT: 34 % — ABNORMAL LOW (ref 39.0–52.0)
HCT: 36 % — ABNORMAL LOW (ref 39.0–52.0)
HCT: 31 % — ABNORMAL LOW (ref 39.0–52.0)
HCT: 39 % (ref 39.0–52.0)
Hemoglobin: 14.6 g/dL (ref 13.0–17.0)
Hemoglobin: 13.3 g/dL (ref 13.0–17.0)
Hemoglobin: 11.6 g/dL — ABNORMAL LOW (ref 13.0–17.0)
Hemoglobin: 9.2 g/dL — ABNORMAL LOW (ref 13.0–17.0)
Hemoglobin: 12.2 g/dL — ABNORMAL LOW (ref 13.0–17.0)
Hemoglobin: 10.5 g/dL — ABNORMAL LOW (ref 13.0–17.0)
Hemoglobin: 13.3 g/dL (ref 13.0–17.0)
Potassium: 4.3 mEq/L (ref 3.5–5.1)
Potassium: 4.5 mEq/L (ref 3.5–5.1)
Potassium: 4.3 mEq/L (ref 3.5–5.1)
Potassium: 5.1 mEq/L (ref 3.5–5.1)
Potassium: 5.5 mEq/L — ABNORMAL HIGH (ref 3.5–5.1)
Potassium: 4.4 mEq/L (ref 3.5–5.1)
Potassium: 4.1 mEq/L (ref 3.5–5.1)
Sodium: 139 mEq/L (ref 135–145)
Sodium: 139 mEq/L (ref 135–145)
Sodium: 133 mEq/L — ABNORMAL LOW (ref 135–145)
Sodium: 134 mEq/L — ABNORMAL LOW (ref 135–145)
Sodium: 134 mEq/L — ABNORMAL LOW (ref 135–145)
Sodium: 138 mEq/L (ref 135–145)
Sodium: 140 mEq/L (ref 135–145)

## 2011-07-18 LAB — POCT I-STAT 3, ART BLOOD GAS (G3+)
Acid-base deficit: 3 mmol/L — ABNORMAL HIGH (ref 0.0–2.0)
Acid-base deficit: 4 mmol/L — ABNORMAL HIGH (ref 0.0–2.0)
Acid-base deficit: 5 mmol/L — ABNORMAL HIGH (ref 0.0–2.0)
Acid-base deficit: 3 mmol/L — ABNORMAL HIGH (ref 0.0–2.0)
Bicarbonate: 25.7 mEq/L — ABNORMAL HIGH (ref 20.0–24.0)
Bicarbonate: 23.2 mEq/L (ref 20.0–24.0)
Bicarbonate: 22.4 mEq/L (ref 20.0–24.0)
Bicarbonate: 20.7 mEq/L (ref 20.0–24.0)
Bicarbonate: 24.6 mEq/L — ABNORMAL HIGH (ref 20.0–24.0)
O2 Saturation: 100 %
O2 Saturation: 100 %
O2 Saturation: 100 %
O2 Saturation: 98 %
O2 Saturation: 93 %
Patient temperature: 35.5
Patient temperature: 37
TCO2: 27 mmol/L (ref 0–100)
TCO2: 25 mmol/L (ref 0–100)
TCO2: 24 mmol/L (ref 0–100)
TCO2: 22 mmol/L (ref 0–100)
TCO2: 26 mmol/L (ref 0–100)
pCO2 arterial: 43.9 mmHg (ref 35.0–45.0)
pCO2 arterial: 47.9 mmHg — ABNORMAL HIGH (ref 35.0–45.0)
pCO2 arterial: 43.9 mmHg (ref 35.0–45.0)
pCO2 arterial: 35.7 mmHg (ref 35.0–45.0)
pCO2 arterial: 54.2 mmHg — ABNORMAL HIGH (ref 35.0–45.0)
pH, Arterial: 7.375 (ref 7.350–7.450)
pH, Arterial: 7.293 — ABNORMAL LOW (ref 7.350–7.450)
pH, Arterial: 7.317 — ABNORMAL LOW (ref 7.350–7.450)
pH, Arterial: 7.365 (ref 7.350–7.450)
pH, Arterial: 7.266 — ABNORMAL LOW (ref 7.350–7.450)
pO2, Arterial: 325 mmHg — ABNORMAL HIGH (ref 80.0–100.0)
pO2, Arterial: 499 mmHg — ABNORMAL HIGH (ref 80.0–100.0)
pO2, Arterial: 283 mmHg — ABNORMAL HIGH (ref 80.0–100.0)
pO2, Arterial: 94 mmHg (ref 80.0–100.0)
pO2, Arterial: 78 mmHg — ABNORMAL LOW (ref 80.0–100.0)

## 2011-07-18 LAB — POCT I-STAT, CHEM 8
BUN: 13 mg/dL (ref 6–23)
Calcium, Ion: 1.14 mmol/L (ref 1.12–1.32)
Chloride: 109 mEq/L (ref 96–112)
Creatinine, Ser: 1.1 mg/dL (ref 0.50–1.35)
Glucose, Bld: 114 mg/dL — ABNORMAL HIGH (ref 70–99)
HCT: 37 % — ABNORMAL LOW (ref 39.0–52.0)
Hemoglobin: 12.6 g/dL — ABNORMAL LOW (ref 13.0–17.0)
Potassium: 4.4 mEq/L (ref 3.5–5.1)
Sodium: 141 mEq/L (ref 135–145)
TCO2: 22 mmol/L (ref 0–100)

## 2011-07-18 LAB — PROTIME-INR
INR: 1.57 — ABNORMAL HIGH (ref 0.00–1.49)
Prothrombin Time: 19.1 seconds — ABNORMAL HIGH (ref 11.6–15.2)

## 2011-07-18 LAB — CBC
HCT: 39.5 % (ref 39.0–52.0)
HCT: 35.6 % — ABNORMAL LOW (ref 39.0–52.0)
Hemoglobin: 13.4 g/dL (ref 13.0–17.0)
Hemoglobin: 11.9 g/dL — ABNORMAL LOW (ref 13.0–17.0)
MCH: 30.2 pg (ref 26.0–34.0)
MCH: 29.9 pg (ref 26.0–34.0)
MCHC: 33.9 g/dL (ref 30.0–36.0)
MCHC: 33.4 g/dL (ref 30.0–36.0)
MCV: 89 fL (ref 78.0–100.0)
MCV: 89.4 fL (ref 78.0–100.0)
Platelets: 122 10*3/uL — ABNORMAL LOW (ref 150–400)
Platelets: 121 10*3/uL — ABNORMAL LOW (ref 150–400)
RBC: 4.44 MIL/uL (ref 4.22–5.81)
RBC: 3.98 MIL/uL — ABNORMAL LOW (ref 4.22–5.81)
RDW: 12.7 % (ref 11.5–15.5)
RDW: 12.8 % (ref 11.5–15.5)
WBC: 10.9 10*3/uL — ABNORMAL HIGH (ref 4.0–10.5)
WBC: 11.8 10*3/uL — ABNORMAL HIGH (ref 4.0–10.5)

## 2011-07-18 LAB — CREATININE, SERUM
Creatinine, Ser: 1.09 mg/dL (ref 0.50–1.35)
GFR calc Af Amer: 85 mL/min — ABNORMAL LOW (ref >90)
GFR calc non Af Amer: 73 mL/min — ABNORMAL LOW (ref >90)

## 2011-07-18 LAB — APTT: aPTT: 47 seconds — ABNORMAL HIGH (ref 24–37)

## 2011-07-18 LAB — MAGNESIUM: Magnesium: 3.1 mg/dL — ABNORMAL HIGH (ref 1.5–2.5)

## 2011-07-18 LAB — PLATELET COUNT: Platelets: 170 10*3/uL (ref 150–400)

## 2011-07-18 SURGERY — REPLACEMENT, AORTIC VALVE, OPEN
Anesthesia: General | Site: Chest | Wound class: Clean

## 2011-07-18 MED ORDER — PROPOFOL 10 MG/ML IV EMUL
INTRAVENOUS | Status: DC | PRN
  Administered 2011-07-18: 70 mg via INTRAVENOUS

## 2011-07-18 MED ORDER — MIDAZOLAM HCL 5 MG/5ML IJ SOLN
INTRAMUSCULAR | Status: DC | PRN
  Administered 2011-07-18 (×3): 2 mg via INTRAVENOUS
  Administered 2011-07-18: 5 mg via INTRAVENOUS
  Administered 2011-07-18: 3 mg via INTRAVENOUS

## 2011-07-18 MED ORDER — ACETAMINOPHEN 160 MG/5ML PO SOLN: 650.0000 mg | ORAL | Status: AC

## 2011-07-18 MED ORDER — MORPHINE SULFATE 2 MG/ML IJ SOLN: 1.0000 mg | INTRAMUSCULAR | Status: DC | PRN

## 2011-07-18 MED ORDER — ACETAMINOPHEN 160 MG/5ML PO SOLN
975.0000 mg | Freq: Four times a day (QID) | ORAL | Status: DC
  Filled 2011-07-18: qty 40.6

## 2011-07-18 MED ORDER — SODIUM BICARBONATE 8.4 % IV SOLN
50.0000 meq | Freq: Once | INTRAVENOUS | Status: AC
  Administered 2011-07-18: 50 meq via INTRAVENOUS

## 2011-07-18 MED ORDER — MUPIROCIN CALCIUM 2 % EX CREA
TOPICAL_CREAM | Freq: Two times a day (BID) | CUTANEOUS | Status: DC
  Administered 2011-07-19: 13:00:00 via TOPICAL
  Filled 2011-07-18: qty 15

## 2011-07-18 MED ORDER — MICROFIBRILLAR COLL HEMOSTAT EX PADS
MEDICATED_PAD | CUTANEOUS | Status: DC | PRN
  Administered 2011-07-18: 1 via TOPICAL

## 2011-07-18 MED ORDER — VECURONIUM BROMIDE 10 MG IV SOLR
INTRAVENOUS | Status: DC | PRN
  Administered 2011-07-18: 5 mg via INTRAVENOUS
  Administered 2011-07-18: 7 mg via INTRAVENOUS
  Administered 2011-07-18: 3 mg via INTRAVENOUS
  Administered 2011-07-18: 5 mg via INTRAVENOUS
  Administered 2011-07-18: 10 mg via INTRAVENOUS

## 2011-07-18 MED ORDER — SODIUM CHLORIDE 0.9 % IV SOLN
10.0000 g | INTRAVENOUS | Status: DC | PRN
  Administered 2011-07-18: 5 g/h via INTRAVENOUS

## 2011-07-18 MED ORDER — SODIUM CHLORIDE 0.9 % IJ SOLN
3.0000 mL | INTRAMUSCULAR | Status: DC | PRN
  Administered 2011-07-19 – 2011-07-20 (×2): 3 mL via INTRAVENOUS

## 2011-07-18 MED ORDER — ACETAMINOPHEN 650 MG RE SUPP
650.0000 mg | RECTAL | Status: AC
  Administered 2011-07-18: 650 mg via RECTAL
  Filled 2011-07-18: qty 1

## 2011-07-18 MED ORDER — FENTANYL CITRATE 0.05 MG/ML IJ SOLN
10.0000 ug | INTRAMUSCULAR | Status: DC | PRN
  Administered 2011-07-18 – 2011-07-20 (×8): 10 ug via INTRAVENOUS
  Filled 2011-07-18 (×4): qty 2

## 2011-07-18 MED ORDER — ACETAMINOPHEN 500 MG PO TABS
1000.0000 mg | ORAL_TABLET | Freq: Four times a day (QID) | ORAL | Status: DC
  Administered 2011-07-19 – 2011-07-20 (×5): 1000 mg via ORAL
  Filled 2011-07-18 (×9): qty 2

## 2011-07-18 MED ORDER — PAPAVERINE HCL 30 MG/ML IJ SOLN
INTRAMUSCULAR | Status: DC | PRN
  Administered 2011-07-18: 60 mg via INTRAVENOUS

## 2011-07-18 MED ORDER — MAGNESIUM SULFATE 40 MG/ML IJ SOLN
4.0000 g | Freq: Once | INTRAMUSCULAR | Status: AC
  Administered 2011-07-18: 4 g via INTRAVENOUS
  Filled 2011-07-18: qty 100

## 2011-07-18 MED ORDER — SODIUM CHLORIDE 0.9 % IJ SOLN
OROMUCOSAL | Status: DC | PRN
  Administered 2011-07-18: 10:00:00 via TOPICAL

## 2011-07-18 MED ORDER — PROTAMINE SULFATE 10 MG/ML IV SOLN
INTRAVENOUS | Status: DC | PRN
  Administered 2011-07-18: 30 mg via INTRAVENOUS
  Administered 2011-07-18 (×5): 50 mg via INTRAVENOUS

## 2011-07-18 MED ORDER — OXYCODONE HCL 5 MG PO TABS
5.0000 mg | ORAL_TABLET | ORAL | Status: DC | PRN
  Administered 2011-07-19 (×5): 5 mg via ORAL
  Administered 2011-07-20: 10 mg via ORAL
  Filled 2011-07-18: qty 1
  Filled 2011-07-18: qty 2
  Filled 2011-07-18 (×4): qty 1

## 2011-07-18 MED ORDER — NITROGLYCERIN IN D5W 200-5 MCG/ML-% IV SOLN
0.0000 ug/min | INTRAVENOUS | Status: DC
  Administered 2011-07-18: 5 ug/min via INTRAVENOUS

## 2011-07-18 MED ORDER — METOPROLOL TARTRATE 12.5 MG HALF TABLET
12.5000 mg | ORAL_TABLET | Freq: Two times a day (BID) | ORAL | Status: DC
  Filled 2011-07-18 (×3): qty 1

## 2011-07-18 MED ORDER — LACTATED RINGERS IV SOLN
INTRAVENOUS | Status: DC | PRN
  Administered 2011-07-18: 07:00:00 via INTRAVENOUS

## 2011-07-18 MED ORDER — FAMOTIDINE IN NACL 20-0.9 MG/50ML-% IV SOLN
20.0000 mg | Freq: Two times a day (BID) | INTRAVENOUS | Status: AC
  Administered 2011-07-18: 20 mg via INTRAVENOUS

## 2011-07-18 MED ORDER — LACTATED RINGERS IV SOLN
INTRAVENOUS | Status: DC
  Administered 2011-07-18: 15:00:00 via INTRAVENOUS

## 2011-07-18 MED ORDER — METOPROLOL TARTRATE 1 MG/ML IV SOLN: 2.5000 mg | INTRAVENOUS | Status: DC | PRN

## 2011-07-18 MED ORDER — SODIUM CHLORIDE 0.9 % IV SOLN
0.4000 ug/kg/h | INTRAVENOUS | Status: AC
  Administered 2011-07-18: 0.7 ug/kg/h via INTRAVENOUS
  Filled 2011-07-18: qty 2

## 2011-07-18 MED ORDER — BISACODYL 5 MG PO TBEC
10.0000 mg | DELAYED_RELEASE_TABLET | Freq: Every day | ORAL | Status: DC
  Administered 2011-07-19 – 2011-07-20 (×2): 10 mg via ORAL
  Filled 2011-07-18 (×2): qty 2

## 2011-07-18 MED ORDER — SODIUM CHLORIDE 0.9 % IV SOLN: INTRAVENOUS | Status: DC

## 2011-07-18 MED ORDER — VANCOMYCIN HCL 1000 MG IV SOLR
1000.0000 mg | Freq: Once | INTRAVENOUS | Status: AC
  Administered 2011-07-18: 1000 mg via INTRAVENOUS
  Filled 2011-07-18: qty 1000

## 2011-07-18 MED ORDER — DOCUSATE SODIUM 100 MG PO CAPS
200.0000 mg | ORAL_CAPSULE | Freq: Every day | ORAL | Status: DC
  Administered 2011-07-19 – 2011-07-20 (×2): 200 mg via ORAL
  Filled 2011-07-18 (×2): qty 2

## 2011-07-18 MED ORDER — LACTATED RINGERS IV SOLN: 500.0000 mL | Freq: Once | INTRAVENOUS | Status: AC | PRN

## 2011-07-18 MED ORDER — ASPIRIN 81 MG PO CHEW: 324.0000 mg | CHEWABLE_TABLET | Freq: Every day | ORAL | Status: DC

## 2011-07-18 MED ORDER — METOPROLOL TARTRATE 25 MG/10 ML ORAL SUSPENSION
12.5000 mg | Freq: Two times a day (BID) | ORAL | Status: DC
  Administered 2011-07-19: 25 mg
  Filled 2011-07-18 (×3): qty 5

## 2011-07-18 MED ORDER — LACTATED RINGERS IV SOLN
INTRAVENOUS | Status: DC | PRN
  Administered 2011-07-18 (×2): via INTRAVENOUS

## 2011-07-18 MED ORDER — SODIUM CHLORIDE 0.9 % IJ SOLN
3.0000 mL | Freq: Two times a day (BID) | INTRAMUSCULAR | Status: DC
  Administered 2011-07-19 – 2011-07-20 (×3): 3 mL via INTRAVENOUS

## 2011-07-18 MED ORDER — MORPHINE SULFATE 4 MG/ML IJ SOLN: 2.0000 mg | INTRAMUSCULAR | Status: DC | PRN

## 2011-07-18 MED ORDER — NITROGLYCERIN IN D5W 200-5 MCG/ML-% IV SOLN
INTRAVENOUS | Status: DC | PRN
  Administered 2011-07-18: 5 ug/min via INTRAVENOUS

## 2011-07-18 MED ORDER — POTASSIUM CHLORIDE 10 MEQ/50ML IV SOLN: 10.0000 meq | INTRAVENOUS | Status: AC

## 2011-07-18 MED ORDER — ASPIRIN EC 325 MG PO TBEC
325.0000 mg | DELAYED_RELEASE_TABLET | Freq: Every day | ORAL | Status: DC
  Administered 2011-07-19 – 2011-07-20 (×2): 325 mg via ORAL
  Filled 2011-07-18 (×2): qty 1

## 2011-07-18 MED ORDER — ALBUMIN HUMAN 5 % IV SOLN: 12.5000 g | Freq: Once | INTRAVENOUS | Status: AC | PRN

## 2011-07-18 MED ORDER — ONDANSETRON HCL 4 MG/2ML IJ SOLN
4.0000 mg | Freq: Four times a day (QID) | INTRAMUSCULAR | Status: DC | PRN
  Administered 2011-07-19: 4 mg via INTRAVENOUS
  Filled 2011-07-18: qty 2

## 2011-07-18 MED ORDER — MIDAZOLAM HCL 2 MG/2ML IJ SOLN: 2.0000 mg | INTRAMUSCULAR | Status: DC | PRN

## 2011-07-18 MED ORDER — DEXTROSE 5 % IV SOLN
1.5000 g | Freq: Two times a day (BID) | INTRAVENOUS | Status: AC
  Administered 2011-07-18 – 2011-07-20 (×4): 1.5 g via INTRAVENOUS
  Filled 2011-07-18 (×4): qty 1.5

## 2011-07-18 MED ORDER — ALBUMIN HUMAN 5 % IV SOLN
INTRAVENOUS | Status: DC | PRN
  Administered 2011-07-18 (×2): 12.5 g
  Administered 2011-07-18: 14:00:00 via INTRAVENOUS

## 2011-07-18 MED ORDER — CITALOPRAM HYDROBROMIDE 40 MG PO TABS
40.0000 mg | ORAL_TABLET | Freq: Every day | ORAL | Status: DC
  Administered 2011-07-19 – 2011-07-27 (×9): 40 mg via ORAL
  Filled 2011-07-18 (×9): qty 1

## 2011-07-18 MED ORDER — CHLORHEXIDINE GLUCONATE CLOTH 2 % EX PADS
6.0000 | MEDICATED_PAD | Freq: Every day | CUTANEOUS | Status: AC
  Administered 2011-07-19 – 2011-07-23 (×5): 6 via TOPICAL

## 2011-07-18 MED ORDER — ALBUMIN HUMAN 5 % IV SOLN
250.0000 mL | INTRAVENOUS | Status: AC | PRN
  Administered 2011-07-18 – 2011-07-19 (×3): 250 mL via INTRAVENOUS
  Filled 2011-07-18 (×3): qty 250

## 2011-07-18 MED ORDER — FENTANYL CITRATE 0.05 MG/ML IJ SOLN
INTRAMUSCULAR | Status: DC | PRN
  Administered 2011-07-18: 250 ug via INTRAVENOUS
  Administered 2011-07-18: 50 ug via INTRAVENOUS
  Administered 2011-07-18 (×2): 250 ug via INTRAVENOUS
  Administered 2011-07-18 (×2): 50 ug via INTRAVENOUS
  Administered 2011-07-18: 250 ug via INTRAVENOUS
  Administered 2011-07-18: 50 ug via INTRAVENOUS
  Administered 2011-07-18: 250 ug via INTRAVENOUS
  Administered 2011-07-18 (×3): 50 ug via INTRAVENOUS
  Administered 2011-07-18 (×2): 250 ug via INTRAVENOUS
  Administered 2011-07-18: 50 ug via INTRAVENOUS
  Administered 2011-07-18 (×4): 250 ug via INTRAVENOUS

## 2011-07-18 MED ORDER — SODIUM CHLORIDE 0.9 % IV SOLN: 250.0000 mL | INTRAVENOUS | Status: DC

## 2011-07-18 MED ORDER — PANTOPRAZOLE SODIUM 40 MG PO TBEC: 40.0000 mg | DELAYED_RELEASE_TABLET | Freq: Every day | ORAL | Status: DC

## 2011-07-18 MED ORDER — BISACODYL 10 MG RE SUPP: 10.0000 mg | Freq: Every day | RECTAL | Status: DC

## 2011-07-18 MED ORDER — PHENYLEPHRINE HCL 10 MG/ML IJ SOLN
0.0000 ug/min | INTRAVENOUS | Status: DC
  Filled 2011-07-18: qty 2

## 2011-07-18 MED ORDER — HEPARIN SODIUM (PORCINE) 1000 UNIT/ML IJ SOLN
INTRAMUSCULAR | Status: DC | PRN
  Administered 2011-07-18: 28000 [IU] via INTRAVENOUS

## 2011-07-18 MED ORDER — SODIUM CHLORIDE 0.9 % IV SOLN: 0.1000 ug/kg/h | INTRAVENOUS | Status: DC

## 2011-07-18 MED ORDER — EPHEDRINE SULFATE 50 MG/ML IJ SOLN
INTRAMUSCULAR | Status: DC | PRN
  Administered 2011-07-18: 5 mg via INTRAVENOUS
  Administered 2011-07-18: 2 mg via INTRAVENOUS

## 2011-07-18 MED ORDER — FENTANYL BOLUS VIA INFUSION
10.0000 ug | INTRAVENOUS | Status: DC | PRN
  Filled 2011-07-18: qty 10

## 2011-07-18 MED ORDER — SODIUM CHLORIDE 0.45 % IV SOLN
INTRAVENOUS | Status: DC
  Administered 2011-07-18 – 2011-07-19 (×2): via INTRAVENOUS

## 2011-07-18 SURGICAL SUPPLY — 190 items
ADAPTER CARDIO PERF ANTE/RETRO (ADAPTER) ×3 IMPLANT
ADH SRG 12 PREFL SYR 3 SPRDR (MISCELLANEOUS)
ADPR PRFSN 84XANTGRD RTRGD (ADAPTER) ×2
APL SKNCLS STERI-STRIP NONHPOA (GAUZE/BANDAGES/DRESSINGS)
APPLICATOR COTTON TIP 6IN STRL (MISCELLANEOUS) IMPLANT
ATTRACTOMAT 16X20 MAGNETIC DRP (DRAPES) ×3 IMPLANT
BAG DECANTER FOR FLEXI CONT (MISCELLANEOUS) ×3 IMPLANT
BANDAGE ELASTIC 4 VELCRO ST LF (GAUZE/BANDAGES/DRESSINGS) ×3 IMPLANT
BANDAGE ELASTIC 6 VELCRO ST LF (GAUZE/BANDAGES/DRESSINGS) ×3 IMPLANT
BANDAGE GAUZE ELAST BULKY 4 IN (GAUZE/BANDAGES/DRESSINGS) ×3 IMPLANT
BENZOIN TINCTURE PRP APPL 2/3 (GAUZE/BANDAGES/DRESSINGS) IMPLANT
BLADE SAW STERNAL (BLADE) ×3 IMPLANT
BLADE SURG 11 STRL SS (BLADE) IMPLANT
BLADE SURG 15 STRL LF DISP TIS (BLADE) ×2 IMPLANT
BLADE SURG 15 STRL SS (BLADE) ×3
BLADE SURG ROTATE 9660 (MISCELLANEOUS) ×2 IMPLANT
BOOT SUTURE AID YELLOW STND (SUTURE) ×1 IMPLANT
CANISTER SUCTION 2500CC (MISCELLANEOUS) ×3 IMPLANT
CANN PRFSN .5XCNCT 15X34-48 (MISCELLANEOUS) ×2
CANNULA ARTERIAL 007325 (MISCELLANEOUS) IMPLANT
CANNULA ARTERIAL 14F 007324 (MISCELLANEOUS) IMPLANT
CANNULA ARTERIAL 18F 007308 (MISCELLANEOUS) IMPLANT
CANNULA ARTERIAL 20F L7309 (MISCELLANEOUS) IMPLANT
CANNULA ARTERIAL 22F 007310 (MISCELLANEOUS) IMPLANT
CANNULA ARTERIAL 24F 007311 (MISCELLANEOUS) IMPLANT
CANNULA GUNDRY RCSP 15FR (MISCELLANEOUS) IMPLANT
CANNULA PRFSN .5XCNCT 15X34-48 (MISCELLANEOUS) ×2 IMPLANT
CANNULA VEN 2 STAGE (MISCELLANEOUS) ×3
CANNULA VENOUS DUAL 32/40 (CANNULA) IMPLANT
CANNULA VESSEL W/WING WO/VALVE (CANNULA) IMPLANT
CATH CPB KIT GERHARDT (MISCELLANEOUS) ×3 IMPLANT
CATH HEART VENT LEFT (CATHETERS) ×2 IMPLANT
CATH RETROPLEGIA CORONARY 14FR (CATHETERS) ×3 IMPLANT
CATH ROBINSON RED A/P 18FR (CATHETERS) IMPLANT
CATH THORACIC 28FR (CATHETERS) ×3 IMPLANT
CATH THORACIC 28FR RT ANG (CATHETERS) IMPLANT
CATH THORACIC 36FR (CATHETERS) IMPLANT
CATH THORACIC 36FR RT ANG (CATHETERS) IMPLANT
CATH/SQUID NICHOLS JEHLE COR (CATHETERS) IMPLANT
CLIP FOGARTY SPRING 6M (CLIP) IMPLANT
CLIP TI MEDIUM 24 (CLIP) IMPLANT
CLIP TI MEDIUM 6 (CLIP) ×3 IMPLANT
CLIP TI WIDE RED SMALL 24 (CLIP) IMPLANT
CLIP TI WIDE RED SMALL 6 (CLIP) IMPLANT
CLOTH BEACON ORANGE TIMEOUT ST (SAFETY) ×3 IMPLANT
CONN 1/2X1/2X1/2  BEN (MISCELLANEOUS)
CONN 1/2X1/2X1/2 BEN (MISCELLANEOUS) IMPLANT
CONN 3/8X3/8 GISH STERILE (MISCELLANEOUS) IMPLANT
CONN Y 3/8X3/8X3/8  BEN (MISCELLANEOUS)
CONN Y 3/8X3/8X3/8 BEN (MISCELLANEOUS) IMPLANT
COVER SURGICAL LIGHT HANDLE (MISCELLANEOUS) ×6 IMPLANT
CRADLE DONUT ADULT HEAD (MISCELLANEOUS) ×3 IMPLANT
DRAIN CHANNEL 15F RND FF W/TCR (WOUND CARE) IMPLANT
DRAIN CHANNEL 19F RND (DRAIN) IMPLANT
DRAIN CHANNEL 28F RND 3/8 FF (WOUND CARE) ×3 IMPLANT
DRAIN CHANNEL 32F RND 10.7 FF (WOUND CARE) IMPLANT
DRAIN SNY 10X20 3/4 PERF (WOUND CARE) IMPLANT
DRAIN WOUND SNY 15 RND (WOUND CARE) IMPLANT
DRAPE CARDIOVASCULAR INCISE (DRAPES) ×3
DRAPE SLUSH MACHINE 52X66 (DRAPES) ×3 IMPLANT
DRAPE SLUSH/WARMER DISC (DRAPES) IMPLANT
DRAPE SRG 135X102X78XABS (DRAPES) ×2 IMPLANT
DRSG COVADERM 4X14 (GAUZE/BANDAGES/DRESSINGS) ×3 IMPLANT
DRSG TELFA 4X14 ISLAND ADH (GAUZE/BANDAGES/DRESSINGS) ×2 IMPLANT
ELECT BLADE 4.0 EZ CLEAN MEGAD (MISCELLANEOUS) ×3
ELECT CAUTERY BLADE 6.4 (BLADE) ×3 IMPLANT
ELECT REM PT RETURN 9FT ADLT (ELECTROSURGICAL) ×6
ELECTRODE BLDE 4.0 EZ CLN MEGD (MISCELLANEOUS) ×2 IMPLANT
ELECTRODE REM PT RTRN 9FT ADLT (ELECTROSURGICAL) ×4 IMPLANT
EVACUATOR SILICONE 100CC (DRAIN) IMPLANT
FELT TEFLON 6X6 (MISCELLANEOUS) IMPLANT
GLOVE BIO SURGEON STRL SZ 6 (GLOVE) ×12 IMPLANT
GLOVE BIO SURGEON STRL SZ 6.5 (GLOVE) ×15 IMPLANT
GLOVE BIO SURGEON STRL SZ7 (GLOVE) ×10 IMPLANT
GLOVE BIO SURGEON STRL SZ7.5 (GLOVE) ×2 IMPLANT
GLOVE BIOGEL PI IND STRL 6 (GLOVE) IMPLANT
GLOVE BIOGEL PI IND STRL 6.5 (GLOVE) IMPLANT
GLOVE BIOGEL PI IND STRL 7.0 (GLOVE) ×6 IMPLANT
GLOVE BIOGEL PI INDICATOR 6 (GLOVE)
GLOVE BIOGEL PI INDICATOR 6.5 (GLOVE)
GLOVE BIOGEL PI INDICATOR 7.0 (GLOVE) ×6
GLOVE EUDERMIC 7 POWDERFREE (GLOVE) IMPLANT
GLOVE ORTHO TXT STRL SZ7.5 (GLOVE) ×4 IMPLANT
GOWN STRL NON-REIN LRG LVL3 (GOWN DISPOSABLE) ×18 IMPLANT
HEMOSTAT POWDER SURGIFOAM 1G (HEMOSTASIS) ×8 IMPLANT
HEMOSTAT SURGICEL 2X14 (HEMOSTASIS) ×3 IMPLANT
INSERT FOGARTY 61MM (MISCELLANEOUS) IMPLANT
INSERT FOGARTY SM (MISCELLANEOUS) ×3 IMPLANT
INSERT FOGARTY XLG (MISCELLANEOUS) IMPLANT
KIT BASIN OR (CUSTOM PROCEDURE TRAY) ×3 IMPLANT
KIT PAIN CUSTOM (MISCELLANEOUS) IMPLANT
KIT ROOM TURNOVER OR (KITS) ×3 IMPLANT
KIT SUCTION CATH 14FR (SUCTIONS) ×6 IMPLANT
KIT VASOVIEW W/TROCAR VH 2000 (KITS) ×3 IMPLANT
LEAD PACING MYOCARDI (MISCELLANEOUS) ×3 IMPLANT
LINE VENT (MISCELLANEOUS) ×2 IMPLANT
MARKER GRAFT CORONARY BYPASS (MISCELLANEOUS) ×9 IMPLANT
NDL 18GX1X1/2 (RX/OR ONLY) (NEEDLE) ×1 IMPLANT
NEEDLE 18GX1X1/2 (RX/OR ONLY) (NEEDLE) ×3 IMPLANT
NEEDLE AORTIC AIR ASPIRATING (NEEDLE) IMPLANT
NS IRRIG 1000ML POUR BTL (IV SOLUTION) ×15 IMPLANT
PACK OPEN HEART (CUSTOM PROCEDURE TRAY) ×3 IMPLANT
PAD ARMBOARD 7.5X6 YLW CONV (MISCELLANEOUS) ×6 IMPLANT
PENCIL BUTTON HOLSTER BLD 10FT (ELECTRODE) ×3 IMPLANT
PUNCH AORTIC ROTATE 4.0MM (MISCELLANEOUS) IMPLANT
PUNCH AORTIC ROTATE 4.5MM 8IN (MISCELLANEOUS) IMPLANT
PUNCH AORTIC ROTATE 5MM 8IN (MISCELLANEOUS) IMPLANT
SET CARDIOPLEGIA MPS 5001102 (MISCELLANEOUS) ×2 IMPLANT
SOLUTION ANTI FOG 6CC (MISCELLANEOUS) IMPLANT
SPONGE GAUZE 4X4 12PLY (GAUZE/BANDAGES/DRESSINGS) ×6 IMPLANT
SPONGE GAUZE 4X4 STERILE 39 (GAUZE/BANDAGES/DRESSINGS) ×2 IMPLANT
SPONGE INTESTINAL PEANUT (DISPOSABLE) IMPLANT
SPONGE LAP 18X18 X RAY DECT (DISPOSABLE) ×4 IMPLANT
SPONGE LAP 4X18 X RAY DECT (DISPOSABLE) IMPLANT
STAPLER VISISTAT 35W (STAPLE) IMPLANT
STOPCOCK 4 WAY LG BORE MALE ST (IV SETS) IMPLANT
STRIP CLOSURE SKIN 1/2X4 (GAUZE/BANDAGES/DRESSINGS) IMPLANT
SURGIFLO TRUKIT (HEMOSTASIS) ×2 IMPLANT
SUT BONE WAX W31G (SUTURE) ×2 IMPLANT
SUT ETHIBON 2 0 V 52N 30 (SUTURE) ×6 IMPLANT
SUT ETHIBON EXCEL 2-0 V-5 (SUTURE) IMPLANT
SUT ETHIBOND 2 0 SH (SUTURE) ×12
SUT ETHIBOND 2 0 SH 36X2 (SUTURE) ×8 IMPLANT
SUT ETHIBOND 2 0 V4 (SUTURE) IMPLANT
SUT ETHIBOND 2 0V4 GREEN (SUTURE) IMPLANT
SUT ETHIBOND NAB MH 2-0 36IN (SUTURE) ×2 IMPLANT
SUT ETHIBOND V-5 VALVE (SUTURE) IMPLANT
SUT MNCRL AB 3-0 PS2 18 (SUTURE) IMPLANT
SUT MNCRL AB 4-0 PS2 18 (SUTURE) IMPLANT
SUT PROLENE 3 0 RB 1 (SUTURE) ×3 IMPLANT
SUT PROLENE 3 0 SH 1 (SUTURE) ×11 IMPLANT
SUT PROLENE 3 0 SH DA (SUTURE) ×3 IMPLANT
SUT PROLENE 3 0 SH1 36 (SUTURE) IMPLANT
SUT PROLENE 4 0 RB 1 (SUTURE) ×24
SUT PROLENE 4 0 SH DA (SUTURE) IMPLANT
SUT PROLENE 4 0 TF (SUTURE) ×6 IMPLANT
SUT PROLENE 4-0 RB1 .5 CRCL 36 (SUTURE) ×8 IMPLANT
SUT PROLENE 5 0 C 1 36 (SUTURE) ×2 IMPLANT
SUT PROLENE 6 0 C 1 30 (SUTURE) IMPLANT
SUT PROLENE 6 0 CC (SUTURE) ×6 IMPLANT
SUT PROLENE 7 0 BV 1 (SUTURE) IMPLANT
SUT PROLENE 7 0 BV1 MDA (SUTURE) ×3 IMPLANT
SUT PROLENE 7.0 RB 3 (SUTURE) IMPLANT
SUT PROLENE 8 0 BV175 6 (SUTURE) ×2 IMPLANT
SUT SILK  1 MH (SUTURE)
SUT SILK 1 MH (SUTURE) ×2 IMPLANT
SUT SILK 1 TIES 10X30 (SUTURE) ×1 IMPLANT
SUT SILK 2 0 (SUTURE)
SUT SILK 2 0 SH CR/8 (SUTURE) ×2 IMPLANT
SUT SILK 2 0 TIES 17X18 (SUTURE) ×3
SUT SILK 2-0 18XBRD TIE 12 (SUTURE) ×1 IMPLANT
SUT SILK 2-0 18XBRD TIE BLK (SUTURE) ×2 IMPLANT
SUT SILK 3 0 SH CR/8 (SUTURE) ×1 IMPLANT
SUT SILK 4 0 (SUTURE)
SUT SILK 4 0 TIES 17X18 (SUTURE) ×3 IMPLANT
SUT SILK 4-0 18XBRD TIE 12 (SUTURE) ×1 IMPLANT
SUT STEEL 6MS V (SUTURE) ×3 IMPLANT
SUT STEEL STERNAL CCS#1 18IN (SUTURE) ×3 IMPLANT
SUT STEEL SZ 6 DBL 3X14 BALL (SUTURE) ×3 IMPLANT
SUT TEM PAC WIRE 2 0 SH (SUTURE) ×2 IMPLANT
SUT VIC AB 1 CT1 18XCR BRD 8 (SUTURE) IMPLANT
SUT VIC AB 1 CT1 8-18 (SUTURE)
SUT VIC AB 1 CTX 18 (SUTURE) ×6 IMPLANT
SUT VIC AB 1 CTX 27 (SUTURE) ×6 IMPLANT
SUT VIC AB 1 CTX 36 (SUTURE)
SUT VIC AB 1 CTX36XBRD ANBCTR (SUTURE) IMPLANT
SUT VIC AB 2-0 CT1 27 (SUTURE)
SUT VIC AB 2-0 CT1 TAPERPNT 27 (SUTURE) IMPLANT
SUT VIC AB 2-0 CTX 27 (SUTURE) IMPLANT
SUT VIC AB 2-0 CTX 36 (SUTURE) ×3 IMPLANT
SUT VIC AB 3-0 SH 27 (SUTURE)
SUT VIC AB 3-0 SH 27X BRD (SUTURE) IMPLANT
SUT VIC AB 3-0 X1 27 (SUTURE) IMPLANT
SUT VICRYL 4-0 PS2 18IN ABS (SUTURE) IMPLANT
SUTURE E-PAK OPEN HEART (SUTURE) ×3 IMPLANT
SYR 10ML KIT SKIN ADHESIVE (MISCELLANEOUS) IMPLANT
SYSTEM SAHARA CHEST DRAIN ATS (WOUND CARE) ×3 IMPLANT
TAPES RETRACTO (MISCELLANEOUS) ×1 IMPLANT
TOWEL OR 17X24 6PK STRL BLUE (TOWEL DISPOSABLE) ×6 IMPLANT
TOWEL OR 17X26 10 PK STRL BLUE (TOWEL DISPOSABLE) ×6 IMPLANT
TRAY CATH LUMEN 1 20CM STRL (SET/KITS/TRAYS/PACK) IMPLANT
TRAY FOLEY IC TEMP SENS 14FR (CATHETERS) ×3 IMPLANT
TRAY FOLEY IC TEMP SENS 16FR (CATHETERS) ×3 IMPLANT
TUBE FEEDING 8FR 16IN STR KANG (MISCELLANEOUS) ×3 IMPLANT
TUBE SUCT INTRACARD DLP 20F (MISCELLANEOUS) ×3 IMPLANT
TUBING INSUFFLATION 10FT LAP (TUBING) ×3 IMPLANT
UNDERPAD 30X30 INCONTINENT (UNDERPADS AND DIAPERS) ×3 IMPLANT
VALVE MAGNA EASE AORTIC 23MM (Prosthesis & Implant Heart) ×2 IMPLANT
VENT LEFT HEART 12002 (CATHETERS) ×3
WATER STERILE IRR 1000ML POUR (IV SOLUTION) ×6 IMPLANT

## 2011-07-18 NOTE — Anesthesia Postprocedure Evaluation (Signed)
  Anesthesia Post-op Note  Patient: [name]  Procedure(s) Performed:  AORTIC VALVE REPLACEMENT (AVR); CORONARY ARTERY BYPASS GRAFTING (CABG) - times one to mammary artery, left  Patient Location: PACU  Anesthesia Type: General  Level of Consciousness: sedated  Airway and Oxygen Therapy: Patient remains intubated per anesthesia plan and Patient placed on Ventilator (see vital sign flow sheet for setting)  Post-op Pain: none  Post-op Assessment: Post-op Vital signs reviewed, Patient's Cardiovascular Status Stable, Respiratory Function Stable, Patent Airway, No signs of Nausea or vomiting and Pain level controlled  Post-op Vital Signs: Reviewed and stable  Complications: No apparent anesthesia complications

## 2011-07-18 NOTE — Progress Notes (Signed)
Patient ID: [name], male   DOB: 09-Mar-1951, 60 y.o.   MRN: [id number]   Filed Vitals:   07/18/11 1845 07/18/11 1900 07/18/11 1915 07/18/11 1930  BP:      Pulse: 90 89 90 84  Temp: 99.3 F (37.4 C) 99.3 F (37.4 C) 99.3 F (37.4 C) 99.3 F (37.4 C)  TempSrc:      Resp: 13 12 15 12   Weight:      SpO2: 100% 100% 100% 99%   CI=1.78 On neo 75mcg/min Still on vent but alert and weaning Urine output good CT output low  BMET    Component Value Date/Time   NA 140 07/18/2011 1409   K 4.1 07/18/2011 1409   CL 102 07/14/2011 1450   CO2 27 07/14/2011 1450   GLUCOSE 106* 07/18/2011 1409   BUN 17 07/14/2011 1450   CREATININE 1.18 07/14/2011 1450   CALCIUM 9.7 07/14/2011 1450   GFRNONAA 66* 07/14/2011 1450   GFRAA 77* 07/14/2011 1450    CBC    Component Value Date/Time   WBC 10.9* 07/18/2011 1400   RBC 4.44 07/18/2011 1400   HGB 13.3 07/18/2011 1409   HCT 39.0 07/18/2011 1409   PLT 122* 07/18/2011 1400   MCV 89.0 07/18/2011 1400   MCH 30.2 07/18/2011 1400   MCHC 33.9 07/18/2011 1400   RDW 12.7 07/18/2011 1400   LYMPHSABS 2.8 07/06/2011 1452   MONOABS 0.7 07/06/2011 1452   EOSABS 0.1 07/06/2011 1452   BASOSABS 0.0 07/06/2011 1452    A/P:  S/P AVR and CABG.  Vasodilated but stable on neo.  Wean to extubate

## 2011-07-18 NOTE — Preoperative (Signed)
Beta Blockers   Reason not to administer Beta Blockers:Not Applicable 

## 2011-07-18 NOTE — Brief Op Note (Addendum)
07/18/2011   12:07 PM  PATIENT:  Norton Blizzard  60 y.o. male  PRE-OPERATIVE DIAGNOSIS:  AORTIC STENOSIS, Single vessel CAD  POST-OPERATIVE DIAGNOSIS: Same  PROCEDURE:  Procedure(s): AORTIC VALVE REPLACEMENT (AVR)w/ #23 Magna-Ease pericardial  CORONARY ARTERY BYPASS GRAFTING (CABG)x1 LIMA-CX  SURGEON:  Surgeon(s): Delight Ovens, MD  PHYSICIAN ASSISTANT: Gershon Crane PA-C  ANESTHESIA:   general  PATIENT CONDITION:  ICU - intubated and hemodynamically stable. sinus rhythum  PRE-OPERATIVE WEIGHT: 98kg  COMPLICATIONS: NO KNOWN  Gershon Crane PA-C

## 2011-07-18 NOTE — Significant Event (Signed)
Attempted to wean from ventilator X 3. Unable to successfully wean. Pt with increased RR and low tidal volume with each weaning episodes. Pt does follow commands; will attempt to wean again. Pt is being monitored. Morgon Pamer, Charity fundraiser.

## 2011-07-18 NOTE — Anesthesia Preprocedure Evaluation (Addendum)
Anesthesia Evaluation  Patient identified by MRN, date of birth, ID band Patient awake    Reviewed: Allergy & Precautions, H&P , NPO status   History of Anesthesia Complications (+) PONV  Airway Mallampati: II TM Distance: >3 FB Neck ROM: Full    Dental  (+) Teeth Intact and Dental Advisory Given   Pulmonary shortness of breath and with exertion,  clear to auscultation  Pulmonary exam normal       Cardiovascular hypertension, Pt. on medications + CAD Regular Normal    Neuro/Psych  Headaches,    GI/Hepatic GERD-  Medicated and Controlled,  Endo/Other    Renal/GU      Musculoskeletal   Abdominal Normal abdominal exam  (+)   Peds  Hematology   Anesthesia Other Findings   Reproductive/Obstetrics                          Anesthesia Physical Anesthesia Plan  ASA: III  Anesthesia Plan: General   Post-op Pain Management:    Induction: Intravenous  Airway Management Planned: Oral ETT  Additional Equipment: Arterial line, CVP, PA Cath and TEE  Intra-op Plan: Delibrate Circulatory arrest per surgeon request  Post-operative Plan: Post-operative intubation/ventilation  Informed Consent: I have reviewed the patients History and Physical, chart, labs and discussed the procedure including the risks, benefits and alternatives for the proposed anesthesia with the patient or authorized representative who has indicated his/her understanding and acceptance.   Dental advisory given  Plan Discussed with: Anesthesiologist, Surgeon and CRNA  Anesthesia Plan Comments:        Anesthesia Quick Evaluation

## 2011-07-18 NOTE — Procedures (Signed)
Extubation Procedure Note  Patient Details:   Name: [name] DOB: 1951-07-23 MRN: [id number]   Airway Documentation:  Airway 8 mm (Active)  Secured at (cm) 22 cm 07/18/2011  9:40 PM  Measured From Lips 07/18/2011  9:40 PM  Secured Location Right 07/18/2011  9:40 PM  Secured By Caron Presume Tape 07/18/2011  9:40 PM  Cuff Pressure (cm H2O) 20 cm H2O 07/18/2011  8:42 PM  Site Condition Dry 07/18/2011  9:40 PM    Evaluation  O2 sats: stable throughout Complications: No apparent complications Patient did tolerate procedure well. Bilateral Breath Sounds: Diminished   Yes  Filbert Schilder 07/18/2011, 10:22 PM    Extubated patient to 4 L/M nasal cannula.  No evidence of stridor present.  Patient is resting comfortably.  Patent had a NIF - 40, and a VC 1.6 Liters

## 2011-07-18 NOTE — Progress Notes (Signed)
Dr. Laneta Simmers aware of ABG results: PH 7.239, pCo2 56.9, p02 112, BE -4, HCO3 24.2, tCO2 26, sO2 97. Vital capacity 1600 (1.6L) and NIF was 40. Order received to give amp bicarb and extubate. Will continue to monitor.

## 2011-07-18 NOTE — Anesthesia Procedure Notes (Signed)
Procedures Procedures: Right IJ Swan Ganz Catheter Insertion: [id number]: The patient was identified and consent obtained.  TO was performed, and full barrier precautions were used.  The skin was anesthetized with lidocaine-4cc plain with 25g needle.  Once the vein was located with the 22 ga. needle using ultrasound guidance , the wire was inserted into the vein.  The wire location was confirmed with ultrasound.  The tissue was dilated and the 8.5 French cordis catheter was carefully inserted. Afterwards Swan Ganz catheter was inserted. PA catheter at 47cm.  The patient tolerated the procedure well.   CE 

## 2011-07-18 NOTE — Transfer of Care (Signed)
Immediate Anesthesia Transfer of Care Note  Patient: [name]  Procedure(s) Performed:  AORTIC VALVE REPLACEMENT (AVR); CORONARY ARTERY BYPASS GRAFTING (CABG) - times one to mammary artery, left  Patient Location: SICU  Anesthesia Type: General  Level of Consciousness: sedated  Airway & Oxygen Therapy: Patient remains intubated per anesthesia plan  Post-op Assessment: Report given to PACU RN and Post -op Vital signs reviewed and stable  Post vital signs: Reviewed  Complications: No apparent anesthesia complications

## 2011-07-18 NOTE — H&P (Signed)
Patient ID: [name], male   DOB: Aug 15, 1950, 60 y.o.   MRN: [id number]                   [address]            [place] [address]          [phone]                      [address]            [place] [address]          [phone]       DAYDEN VIVERETTE [facility] Medical Record #[id number] Date of Birth: 19-Dec-1950  Referring: No ref. provider found Primary Care: Marye Round, MD  Chief Complaint:    No chief complaint on file.   History of Present Illness:     Patient is a 60 year old male who's had a known aortic murmur since age 81 followed intermittently over the years. In October 2012 he noted increasing shortness of breath with increasing activity. In at has had increasing fatigue with exertion he denies chest pain denies syncope or presyncope he said no peripheral edema. He does have a known dilated ascending aorta since CT scan done in 2009 up to 4.1 cm. He's been told in the past that he has a bicuspid aortic valve.   Current Activity/ Functional Status: Patient is  independent with mobility/ambulation, transfers, ADL's, IADL's.   Past Medical History  Diagnosis Date  . Hypertension   . Hyperlipidemia   . Depression   . PVD (peripheral vascular disease)   . Femoral-popliteal bypass graft occlusion, right 1997    Dr. Hart [place] with vein from rt leg known to be occluded  . Claudication in peripheral vascular disease right greater left   . Aortic stenosis 2009    ef 40-45%     Past Surgical History  Procedure Date  . Rotator cuff repair     Left  . Cardiac catheterization 2009, 07/11/11    Dr. Eldridge Dace  . US echocardiography   . Knee arthroscopy     left  . Mri     to visualize aortic valve  . Femoral artery - popliteal artery bypass graft     History  Smoking status  . Never Smoker   Smokeless tobacco  . Not on file   History  Alcohol Use No    History   Social History  . Marital Status:  Married   Occupational History  . Works at [employer] History Main Topics  . Smoking status: Never Smoker   . Smokeless tobacco:   . Alcohol Use: No  .    .                   Allergies  Allergen Reactions  . Codeine Other (See Comments)    Blood drops  . Morphine And Related Itching    Current Facility-Administered Medications  Medication Dose Route Frequency Provider Last Rate Last Dose  . aminocaproic acid (AMICAR) 10 g in sodium chloride 0.9 % 100 mL infusion   Intravenous To OR Crystal Stillinger Robertson, PHARMD      . cefUROXime (ZINACEF) 1.5 g in dextrose 5 % 50 mL IVPB  1.5 g Intravenous On Call [employer], PHARMD      . cefUROXime (ZINACEF) 750 mg in dextrose 5 % 50 mL IVPB  750  mg Intravenous To OR Crystal [employer], PHARMD      . chlorhexidine (HIBICLENS) 4 % liquid 2 application  30 mL Topical UD Delight Ovens, MD      . dexmedetomidine (PRECEDEX) 400 mcg in sodium chloride 0.9 % 100 mL infusion  0.1-0.7 mcg/kg/hr Intravenous To OR Crystal Salomon Fick, PHARMD      . DOPamine (INTROPIN) 800 mg in dextrose 5 % 250 mL infusion  2-20 mcg/kg/min Intravenous To OR Crystal Stillinger Robertson, PHARMD      . EPINEPHrine (ADRENALIN) 4,000 mcg in dextrose 5 % 250 mL infusion  0.5-20 mcg/min Intravenous To OR Crystal [employer], PHARMD      . heparin 2,500 Units, papaverine 30 mg in electrolyte-148 (PLASMALYTE-148) 500 mL irrigation   Irrigation To OR Crystal Stillinger Robertson, PHARMD      . insulin regular (NOVOLIN R,HUMULIN R) 1 Units/mL in sodium chloride 0.9 % 100 mL infusion   Intravenous To OR Crystal [employer], PHARMD      . magnesium sulfate (IV Push/IM) injection 40 mEq  40 mEq Other To OR [employer], PHARMD      . metoprolol tartrate (LOPRESSOR) tablet 12.5 mg  12.5 mg Oral Once Delight Ovens, MD      . nitroGLYCERIN 0.2 mg/mL in dextrose 5 % infusion  2-200 mcg/min  Intravenous To OR Crystal [employer], PHARMD      . phenylephrine (NEO-SYNEPHRINE) 20,000 mcg in dextrose 5 % 250 mL infusion  30-200 mcg/min Intravenous To OR Crystal [employer], PHARMD      . potassium chloride injection 80 mEq  80 mEq Other To OR [employer], PHARMD      . vancomycin (VANCOCIN) 1,250 mg in sodium chloride 0.9 % 250 mL IVPB  1,250 mg Intravenous On Call Crystal Salomon Fick, PHARMD       Facility-Administered Medications Ordered in Other Encounters  Medication Dose Route Frequency Provider Last Rate Last Dose  . lactated ringers infusion    Continuous PRN Hessie Dibble         Family History: no history of aortic aneurysms or aortic dissections or sudden death. Patient's mother died with diabetes and circulatory problems father died of lung cancer patient's sister died of breast cancer at age 6  ROS  Review of Systems:     Cardiac Review of Systems: Y or N  Chest Pain [  n  ]  Resting SOB [ n  ] Exertional SOB  [ y ]  Orthopnea [place]ienne  ]   Pedal Edema [ n  ]    Palpitations [n  ] Syncope  [n  ]   Presyncope [ n  ]  General Review of Systems: [Y] = yes [  ]=no Constitional: recent weight change [n  ]; anorexia [  n]; fatigue [ n ]; nausea [  n]; night sweats [  ]; fever [  ]; or chills [  ];  Dental: poor dentition[n  ]; Last Dentist visit: one year ago has consistent dental care  Eye : blurred vision [  ]; diplopia [   ]; vision changes [  ];  Amaurosis fugax[  ]; Resp: cough [  ];  wheezing[  ];  hemoptysis[  ]; shortness of breath[  ]; paroxysmal nocturnal dyspnea[  ]; dyspnea on exertion[  ]; or orthopnea[  ];  GI:  gallstones[  ], vomiting[  ];  dysphagia[  ]; melena[  ];  hematochezia [  ]; heartburn[  ];   Hx of  Colonoscopy[ y ]; colonoscopy was done 3 years ago GU: kidney stones [   ]; hematuria[  ];   dysuria [  ];  nocturia[  ];  history of     obstruction [  ];             Skin: rash, swelling[  ];, hair loss[  ];  peripheral edema[  ];  or itching[  ]; Musculosketetal: myalgias[  ];  joint swelling[  ];  joint erythema[  ];  joint pain[  ];  back pain[  ];  Heme/Lymph: bruising[  ];  bleeding[  ];  anemia[  ];  Neuro: TIA[  ];  headaches[  ];  stroke[n  ];  vertigo[  ];  seizures[  ];   paresthesias[  ];  difficulty walking[  ];  Psych:depression[  ]; anxiety[  ];  Endocrine: diabetes[  ];  thyroid dysfunction[  ];  Immunizations: Flu [ y ]; Pneumococcal[n ];  Other: claudication right leg  Physical Exam: BP 129/88  Pulse 58  Temp(Src) 98.1 F (36.7 C) (Oral)  Resp 18  Wt 215 lb 2.7 oz (97.6 kg)  SpO2 96%  General appearance: alert, cooperative, appears older than stated age and no distress Neurologic: intact  Heart: prominent apical impulse, regular rate and rhythm and systolic murmur: holosystolic 3/6, crescendo at 2nd right intercostal space Lungs: clear to auscultation bilaterally Abdomen: soft, non-tender; bowel sounds normal; no masses,  no organomegaly Extremities: extremities normal, atraumatic, no cyanosis or edema, no edema, redness or tenderness in the calves or thighs and Patient has no palpable pulses in the right ankle 1+ DP and PT on the left   Diagnostic Studies & Laboratory data:     Recent Radiology Findings:   07/14/11 RADIOLOGY REPORT*  Clinical Data: Follow-up aortic dilatation. Occasional shortness  of breath.  CT ANGIOGRAPHY CHEST WITH CONTRAST  Technique: Multidetector CT imaging of the chest was performed  using the standard protocol during bolus administration of  intravenous contrast. Multiplanar CT image reconstructions  including MIPs were obtained to evaluate the vascular anatomy.  Contrast: OMNIPAQUE IOHEXOL 300 MG/ML IV SOLN  Comparison: Chest CTA 07/06/2008.  Findings: There is stable mild fusiform dilatation  of the  ascending aorta. Maximal diameter is 4.1 cm. There is no focal  aneurysm or dissection. There is focal calcified plaque in the  left subclavian artery near the origin of the left vertebral  artery. Diffuse coronary artery calcifications are present.  The pulmonary arteries are not optimally opacified by this  technique but appear normal.  There are no enlarged mediastinal or hilar lymph nodes. There is  no pleural or pericardial effusion. A small hiatal hernia appears  stable.  The lungs are now clear. There are no suspicious nodules.  Images of the upper abdomen demonstrate hepatic steatosis and a  stable 1.4 cm lesion projecting from the upper pole of the left  kidney. Although  this measures higher than water attenuation, it  appears stable and is likely a small cyst.  Review of the MIP images confirms the above findings.  IMPRESSION:  1. Stable mild ectasia of the ascending aorta with a maximal  diameter 4.1 cm. There is no focal aneurysm or dissection.  2. Focal calcified plaque near the origin of the left vertebral  artery. No large vessel occlusion identified.  3. Diffuse coronary artery calcifications.  4. Interval resolution of pulmonary nodularity.  Original Report Authenticated By: Gerrianne Scale, M.D.        Recent Lab Findings: Lab Results  Component Value Date   WBC 7.6 07/06/2011   HGB 15.2 07/06/2011   HCT 44.4 07/06/2011   PLT 233.0 07/06/2011   GLUCOSE 85 07/06/2011   CHOL  Value: 280        ATP III CLASSIFICATION:  <200     mg/dL   Desirable  401-027  mg/dL   Borderline High  >=253    mg/dL   High* 66/10/4032   TRIG 155* 07/07/2008   HDL 35* 07/07/2008   LDLCALC  Value: 214        Total Cholesterol/HDL:CHD Risk Coronary Heart Disease Risk Table                     Men   Women  1/2 Average Risk   3.4   3.3* 07/07/2008   ALT 36 07/07/2008   AST 33 07/07/2008   NA 137 07/06/2011   K 4.2 07/06/2011   CL 102 07/06/2011   CREATININE 1.1 07/06/2011   BUN 14  07/06/2011   CO2 27 07/06/2011   TSH 1.243 Test methodology is 3rd generation TSH 07/07/2008   INR 1.0 07/06/2011   HGBA1C  Value: 6.2 (NOTE)   The ADA recommends the following therapeutic goal for glycemic   control related to Hgb A1C measurement:   Goal of Therapy:   < 7.0% Hgb A1C   Reference: American Diabetes Association: Clinical Practice   Recommendations 2008, Diabetes Care,  2008, 31:(Suppl 1).* 07/07/2008   Lab Results  Component Value Date   WBC 6.5 07/14/2011   HGB 15.7 07/14/2011   HCT 45.9 07/14/2011   PLT 188 07/14/2011   GLUCOSE 86 07/14/2011   CHOL  Value: 280        ATP III CLASSIFICATION:  <200     mg/dL   Desirable  742-595  mg/dL   Borderline High  >=638    mg/dL   High* 75/12/4330   TRIG 155* 07/07/2008   HDL 35* 07/07/2008   LDLCALC  Value: 214        Total Cholesterol/HDL:CHD Risk Coronary Heart Disease Risk Table                     Men   Women  1/2 Average Risk   3.4   3.3* 07/07/2008   ALT 30 07/14/2011   AST 32 07/14/2011   NA 139 07/14/2011   K 4.1 07/14/2011   CL 102 07/14/2011   CREATININE 1.18 07/14/2011   BUN 17 07/14/2011   CO2 27 07/14/2011   TSH 1.243Test methodology is 3rd generation TSH 07/07/2008   INR 0.94 07/14/2011   HGBA1C 5.8* 07/14/2011   Cardiac Catheterization Procedure Note  Name: [name]  MRN: [id number]  DOB: 02-Nov-1950  Procedure: Right Heart Cath, Left Heart Cath, Selective Coronary Angiography, LV angiography  Indication: A 60 year old gentleman referred for right and left heart  catheterization in the setting of class III angina and exertional dyspnea. He has a known bicuspid aortic valve and his physical exam is consistent with severe aortic stenosis. He has not had a recent echocardiogram.  Procedural Details: The right groin was prepped, draped, and anesthetized with 1% lidocaine. Using the modified Seldinger technique a 6 French sheath was placed in the right femoral artery and a 6 French sheath was placed in the right femoral  vein. A multipurpose catheter was used for the right heart catheterization. Standard protocol was followed for recording of right heart pressures and sampling of oxygen saturations. Fick cardiac output was calculated. Standard Judkins catheters were used for selective coronary angiography and left ventriculography. An exchange length straight tip wire was used to cross the aortic valve. The wire was directed with an AL- 2 catheter. This was changed out for a Langston pigtail catheter for simultaneous LV and aortic pressure recording. There were no immediate procedural complications. The patient was transferred to the post catheterization recovery area for further monitoring.  Procedural Findings:  Hemodynamics  RA 3  RV 26/6  PA 24/8 with a mean of 14  PCWP 8  LV 173/21  AO 124/72 with a mean of 93  Oxygen saturations:  PA 64%  AO 94%  Cardiac Output (Fick) 4.5 L per minute  Cardiac Index (Fick) 2 L/minute/meter square  Aortic valve hemodynamics:  Mean gradient 42, aortic valve area 0.76, aortic valve area index 0.34  Coronary angiography:  Coronary dominance: right  Left mainstem: Widely patent with no significant obstructive disease. There is a 20-30% stenosis in the mid shaft of the left main.  Left anterior descending (LAD): The LAD is a large-caliber vessel. The proximal LAD is tortuous. The mid LAD at the origin of the second diagonal branch has a 30-40% stenosis. The apical portion of the LAD is small in caliber. There are no high-grade stenoses throughout the course of the LAD.  Left circumflex (LCx): The left circumflex has 50-60% ostial stenosis. The mid circumflex has 80% stenosis leading into a large second OM branch. The first obtuse marginal was very small in caliber.  Right coronary artery (RCA): The RCA is dominant. It is an ectatic vessel with diffuse irregularity but there are no areas of high-grade stenosis identified. The proximal RCA has 30% stenosis. The distal RCA just  before the origin of the PDA branch has a 50% stenosis. The PDA and posterolateral branches have mild diffuse disease without focal high-grade stenosis.  Left ventriculography: Left ventricular systolic function is in the low-normal range. The left ventricular ejection fraction is estimated at 50-55%. There is no significant mitral regurgitation identified.  Final Conclusions:  1. Severe bicuspid valve aortic stenosis  2. Severe left circumflex stenosis  3. Mild nonobstructive LAD and right coronary artery stenoses  4. Essentially normal left ventricular systolic function with an estimated left ventricular ejection fraction of 50-55%  5. Essentially normal right heart hemodynamics  Recommendations: [facility] consultation for consideration of aortic valve replacement and bypass of the left circumflex (obtuse marginal branch).  [name]  07/11/2011, 2:01 PM  Echocardiography  (Report amended )  Patient: [name], [name] MR #: [id number] Study Date: 07/13/2011 Gender: M Age: 19 Height: 182.9cm Weight: 98.6kg BSA: 2.62m^2 Pt. Status: Room:  ATTENDING [name], MD ORDERING [name], MD REFERRING [name], MD PERFORMING [facility], [facility] SONOGRAPHER Junious Dresser, RDCS cc:  ------------------------------------------------------------ LV EF: 55% - 60%  ------------------------------------------------------------ Indications: Biscuspid aortic valve 746.4.  Aortic stenosis 424.1.  ------------------------------------------------------------ History: PMH: Acquired from the patient and from the patient's chart. Chest discomfort. Dyspnea, exertional dyspnea, and 2/6 systolic murmur. Bicuspid aortic valve. Coronary artery disease. Moderate aortic stenosis. Risk factors: Hypertension. Dyslipidemia.  ------------------------------------------------------------ Study Conclusions  - Left ventricle: The cavity size was normal. Wall  thickness was increased in a pattern of mild LVH. There was mild concentric hypertrophy. Systolic function was normal. The estimated ejection fraction was in the range of 55% to 60%. Wall motion was normal; there were no regional wall motion abnormalities. Doppler parameters are consistent with abnormal left ventricular relaxation (grade 1 diastolic dysfunction). - Aortic valve: Probable bicuspid valve There was severe stenosis. Mild regurgitation. Mean gradient: 55mm Hg (S). Peak gradient: 99mm Hg (S).  ------------------------------------------------------------ Labs, prior tests, procedures, and surgery: Echocardiography (2009). The aortic valve showed moderate stenosis. EF was 60%. Aortic valve: peak gradient of 32mm Hg and mean gradient of 19mm Hg.  Catheterization (December 2012). The study demonstrated coronary artery disease and significant aortic valve disease. The aortic valve showed severe stenosis. There was an 80%stenosis in the left circumflex coronary artery. EF was 55%. Aortic valve: mean gradient of 42mm Hg and area of 0.8cm^2. Echocardiography. M-mode, complete 2D, spectral Doppler, and color Doppler. Height: Height: 182.9cm. Height: 72in. Weight: Weight: 98.6kg. Weight: 217lb. Body mass index: BMI: 29.5kg/m^2. Body surface area: BSA: 2.88m^2. Blood pressure: 125/102. Patient status: Outpatient. Location: [facility]  ------------------------------------------------------------  ------------------------------------------------------------ Left ventricle: The cavity size was normal. Wall thickness was increased in a pattern of mild LVH. There was mild concentric hypertrophy. Systolic function was normal. The estimated ejection fraction was in the range of 55% to 60%. Wall motion was normal; there were no regional wall motion abnormalities. Doppler parameters are consistent with abnormal left ventricular relaxation (grade 1  diastolic dysfunction).  ------------------------------------------------------------ Aortic valve: Probable bicuspid valve Probably trileaflet; severely calcified leaflets. Doppler: There was severe stenosis. Mild regurgitation. VTI ratio of LVOT to aortic valve: 0.18. Valve area: 0.77cm^2(VTI). Indexed valve area: 0.34cm^2/m^2 (VTI). Peak velocity ratio of LVOT to aortic valve: 0.15. Valve area: 0.61cm^2 (Vmax). Indexed valve area: 0.27cm^2/m^2 (Vmax). Mean gradient: 55mm Hg (S). Peak gradient: 99mm Hg (S).  ------------------------------------------------------------ Aorta: The aorta was normal, not dilated, and non-diseased. Ascending aorta: The ascending aorta was mildly dilated.  ------------------------------------------------------------ Mitral valve: Structurally normal valve. Leaflet separation was normal. Doppler: Transvalvular velocity was within the normal range. There was no evidence for stenosis. Trivial regurgitation.  ------------------------------------------------------------ Left atrium: The atrium was at the upper limits of normal in size.  ------------------------------------------------------------ Right ventricle: The cavity size was normal. Wall thickness was normal. Systolic function was normal.  ------------------------------------------------------------ Pulmonic valve: The valve appears to be grossly normal. Doppler: No significant regurgitation.  ------------------------------------------------------------ Tricuspid valve: Structurally normal valve. Leaflet separation was normal. Doppler: Transvalvular velocity was within the normal range. No regurgitation.  ------------------------------------------------------------ Pulmonary artery: The main pulmonary artery was normal-sized.  ------------------------------------------------------------ Right atrium: The atrium was normal in  size.  ------------------------------------------------------------ Pericardium: The pericardium was normal in appearance.  ------------------------------------------------------------ Systemic veins: Inferior vena cava: The vessel was normal in size; the respirophasic diameter changes were in the normal range (= 50%); findings are consistent with normal central venous pressure.  ------------------------------------------------------------ Post procedure conclusions Ascending Aorta:  - The aorta was normal, not dilated, and non-diseased.  ------------------------------------------------------------  2D measurements Normal Doppler measurements Normal Left ventricle Left ventricle LVID ED, 43.2 mm 43-52 Ea, lat 5.76 cm/s ------ chord, ann, PLAX tiss DP LVID ES, 31.6  mm 23-38 E/Ea, 11.67 ------ chord, lat ann, PLAX tiss DP FS, chord, 27 % >29 Ea, med 6.59 cm/s ------ PLAX ann, LVPW, ED 13.2 mm ------ tiss DP IVS/LVPW 0.94 <1.3 E/Ea, 10.2 ------ ratio, ED med ann, Ventricular septum tiss DP IVS, ED 12.4 mm ------ LVOT LVOT Peak 72.6 cm/s ------ Diam, S 23.1 mm ------ vel, S Area 4.19 cm^2 ------ VTI, S 21.3 cm ------ Diam 23 mm ------ Stroke 88.5 ml ------ Aorta vol Root diam, 35.6 mm ------ Stroke 39.2 ml/m^2 ------ ED index AAo AP 40.6 mm ------ Aortic valve diam, S Peak 498 cm/s ------ Left atrium vel, S AP dim 37.7 mm ------ Mean 331 cm/s ------ AP dim 1.67 cm/m^2 <2.2 vel, S index VTI, S 116 cm ------ Mean 55 mm Hg ------ gradient , S Peak 99 mm Hg ------ gradient , S VTI 0.18 ------ ratio LVOT/AV Area, 0.77 cm^2 ------ VTI Area 0.34 cm^2/m^ ------ index 2 (VTI) Peak vel 0.15 ------ ratio, LVOT/AV Area, 0.61 cm^2 ------ Vmax Area 0.27 cm^2/m^ ------ index 2 (Vmax) Mitral valve Peak E 67.2 cm/s ------ vel Peak A 57.6 cm/s ------ vel Peak E/A 1.17 ------ ratio Right ventricle Sa vel, 11.4 cm/s ------ lat ann, tiss  DP  ------------------------------------------------------------ Godfrey Pick, Peter 2012-12-13T14:33:21.020  CT ANGIOGRAPHY CHEST  12/7/2009Technique: Multidetector CT imaging of the chest using the  standard protocol during bolus administration of intravenous  contrast. Multiplanar reconstructed images including MIPs were  obtained and reviewed to evaluate the vascular anatomy.  Contrast: 80 ml Omnipaque-300.  Comparison: Chest radiograph 07/06/2008  Findings: Three-vessel aortic arch identified. Fatty liver is  noted on incidental imaging of the upper abdomen. No axillary  adenopathy. Mildly prominent mediastinal lymph nodes are present  which are not pathologically enlarged. 1.2 cm right hilar lymph  node is present. Small paratracheal lymph nodes are noted.  Coronary artery atherosclerosis is present. If office based  assessment of coronary risk factors has not been performed, it is  now recommended. . Aortic valvular calcifications are present.  Ectasia of the ascending aorta. Maximal measurement of the  ascending aorta is 4.1 cm when measured at the level of the  pulmonary artery. Descending thoracic aorta is within normal  limits.  The study is technically adequate to evaluate for pulmonary  embolus. No pulmonary embolus is present.  Bone windows demonstrate right sternoclavicular joint  osteoarthritis with subchondral cystic change. Partially  visualized large left upper pole low density renal lesion with  maximal diameter 4.1 cm.  Lung windows show interlobular septal thickening which is apical  predominant. Several small areas of nodularity are present. On  image number 20 6 x 6 mm peripheral pulmonary nodules present in  the right upper lobe. Other smaller ill-defined pulmonary nodules  are present along with some ground-glass attenuation.  Tiny bilateral pleural effusions are present.  IMPRESSION:  1. Technically adequate study without pulmonary embolus.  2.  Interlobular septal thickening and scattered areas of ground-  glass attenuation are most consistent with volume overload and  congestive heart failure, possibly related to aortic valvular  disease. Tiny bilateral pleural effusions.  3. Ectasia of the ascending aorta, also possibly related to aortic  valvular disease.  4. Follow-up chest CT recommended in 3-6 months depending on  patient risk factors to reassess for persistence of pulmonary  nodules. These may represent focal areas of early alveolar edema  in this patient with congestive change. This recommendation follows  the consensus statement: Guidelines for Management of Small  Pulmonary Nodules Detected on CT Scans: A Statement from the  Fleischner Society as published in Radiology 2005; 237:395-400.  Available online at: http://www.copeland.com/-  nodule.htm.  5. Fatty liver.  Provider: Dicie Beam    Assessment / Plan:   Patient with symptomatic critical aortic  stenosis mild aortic insufficiency  with dilated ascending aorta to 4.1 cm in 2009. History of peripheral vascular disease status post right vein femoral-popliteal bypass now occluded  Coronary occlusive disease with significant disease in the circumflex coronary artery  With the patient's symptomatic critical aortic stenosis and coronary occlusive disease I agree with Dr. Excell Seltzer that we should proceed with aortic valve replacement and coronary artery bypass grafting. Repeat CT shows no change in size of aorta and no suspecious nodules.  I discussed with the patient in detail the replacement of aortic valve with a tissue versus mechanical valve. The long-term risks of Coumadin versus potential for redo surgery in the future has also been discussed. The patient prefers a tissue valve.  The goals risks and alternatives of the planned surgical procedure AVR CABG possible replacement of Ascending Aorta have been discussed with the patient in detail. The  risks of the procedure including death, infection, stroke, myocardial infarction, bleeding, blood transfusion have all been discussed specifically.  I have quoted [name] a 3 % of perioperative mortality and a complication rate as high as 20 %. The patient's questions have been answered.[name] is willing  to proceed with the planned procedure.     Delight Ovens MD  Beeper [phone] Office [phone] 07/18/2011 7:17 AM

## 2011-07-19 ENCOUNTER — Inpatient Hospital Stay (HOSPITAL_COMMUNITY): Payer: 59

## 2011-07-19 ENCOUNTER — Encounter (HOSPITAL_COMMUNITY): Payer: Self-pay | Admitting: Cardiothoracic Surgery

## 2011-07-19 LAB — GLUCOSE, CAPILLARY
Glucose-Capillary: 103 mg/dL — ABNORMAL HIGH (ref 70–99)
Glucose-Capillary: 110 mg/dL — ABNORMAL HIGH (ref 70–99)
Glucose-Capillary: 114 mg/dL — ABNORMAL HIGH (ref 70–99)
Glucose-Capillary: 115 mg/dL — ABNORMAL HIGH (ref 70–99)
Glucose-Capillary: 116 mg/dL — ABNORMAL HIGH (ref 70–99)
Glucose-Capillary: 118 mg/dL — ABNORMAL HIGH (ref 70–99)
Glucose-Capillary: 120 mg/dL — ABNORMAL HIGH (ref 70–99)
Glucose-Capillary: 123 mg/dL — ABNORMAL HIGH (ref 70–99)
Glucose-Capillary: 128 mg/dL — ABNORMAL HIGH (ref 70–99)
Glucose-Capillary: 129 mg/dL — ABNORMAL HIGH (ref 70–99)
Glucose-Capillary: 98 mg/dL (ref 70–99)
Glucose-Capillary: 138 mg/dL — ABNORMAL HIGH (ref 70–99)
Glucose-Capillary: 108 mg/dL — ABNORMAL HIGH (ref 70–99)
Glucose-Capillary: 113 mg/dL — ABNORMAL HIGH (ref 70–99)
Glucose-Capillary: 118 mg/dL — ABNORMAL HIGH (ref 70–99)
Glucose-Capillary: 120 mg/dL — ABNORMAL HIGH (ref 70–99)
Glucose-Capillary: 134 mg/dL — ABNORMAL HIGH (ref 70–99)
Glucose-Capillary: 156 mg/dL — ABNORMAL HIGH (ref 70–99)
Glucose-Capillary: 130 mg/dL — ABNORMAL HIGH (ref 70–99)
Glucose-Capillary: 134 mg/dL — ABNORMAL HIGH (ref 70–99)
Glucose-Capillary: 134 mg/dL — ABNORMAL HIGH (ref 70–99)
Glucose-Capillary: 151 mg/dL — ABNORMAL HIGH (ref 70–99)

## 2011-07-19 LAB — POCT I-STAT, CHEM 8
BUN: 18 mg/dL (ref 6–23)
Calcium, Ion: 1.13 mmol/L (ref 1.12–1.32)
Chloride: 100 mEq/L (ref 96–112)
Creatinine, Ser: 1.2 mg/dL (ref 0.50–1.35)
Glucose, Bld: 139 mg/dL — ABNORMAL HIGH (ref 70–99)
HCT: 32 % — ABNORMAL LOW (ref 39.0–52.0)
Hemoglobin: 10.9 g/dL — ABNORMAL LOW (ref 13.0–17.0)
Potassium: 4.1 mEq/L (ref 3.5–5.1)
Sodium: 136 mEq/L (ref 135–145)
TCO2: 25 mmol/L (ref 0–100)

## 2011-07-19 LAB — POCT I-STAT 3, ART BLOOD GAS (G3+)
Acid-base deficit: 4 mmol/L — ABNORMAL HIGH (ref 0.0–2.0)
Bicarbonate: 24.2 mEq/L — ABNORMAL HIGH (ref 20.0–24.0)
O2 Saturation: 97 %
Patient temperature: 37.4
TCO2: 26 mmol/L (ref 0–100)
pCO2 arterial: 56.9 mmHg — ABNORMAL HIGH (ref 35.0–45.0)
pH, Arterial: 7.239 — ABNORMAL LOW (ref 7.350–7.450)
pO2, Arterial: 112 mmHg — ABNORMAL HIGH (ref 80.0–100.0)

## 2011-07-19 LAB — BASIC METABOLIC PANEL
BUN: 15 mg/dL (ref 6–23)
CO2: 25 mEq/L (ref 19–32)
Calcium: 7.9 mg/dL — ABNORMAL LOW (ref 8.4–10.5)
Chloride: 109 mEq/L (ref 96–112)
Creatinine, Ser: 1.09 mg/dL (ref 0.50–1.35)
GFR calc Af Amer: 85 mL/min — ABNORMAL LOW (ref >90)
GFR calc non Af Amer: 73 mL/min — ABNORMAL LOW (ref >90)
Glucose, Bld: 124 mg/dL — ABNORMAL HIGH (ref 70–99)
Potassium: 4.3 mEq/L (ref 3.5–5.1)
Sodium: 143 mEq/L (ref 135–145)

## 2011-07-19 LAB — CBC
HCT: 34 % — ABNORMAL LOW (ref 39.0–52.0)
HCT: 31.7 % — ABNORMAL LOW (ref 39.0–52.0)
Hemoglobin: 11.4 g/dL — ABNORMAL LOW (ref 13.0–17.0)
Hemoglobin: 10.4 g/dL — ABNORMAL LOW (ref 13.0–17.0)
MCH: 30.6 pg (ref 26.0–34.0)
MCH: 30.2 pg (ref 26.0–34.0)
MCHC: 33.5 g/dL (ref 30.0–36.0)
MCHC: 32.8 g/dL (ref 30.0–36.0)
MCV: 91.4 fL (ref 78.0–100.0)
MCV: 92.2 fL (ref 78.0–100.0)
Platelets: 124 10*3/uL — ABNORMAL LOW (ref 150–400)
Platelets: 96 10*3/uL — ABNORMAL LOW (ref 150–400)
RBC: 3.72 MIL/uL — ABNORMAL LOW (ref 4.22–5.81)
RBC: 3.44 MIL/uL — ABNORMAL LOW (ref 4.22–5.81)
RDW: 13.3 % (ref 11.5–15.5)
RDW: 13.5 % (ref 11.5–15.5)
WBC: 14.6 10*3/uL — ABNORMAL HIGH (ref 4.0–10.5)
WBC: 14.1 10*3/uL — ABNORMAL HIGH (ref 4.0–10.5)

## 2011-07-19 LAB — CREATININE, SERUM
Creatinine, Ser: 1.11 mg/dL (ref 0.50–1.35)
GFR calc Af Amer: 83 mL/min — ABNORMAL LOW (ref >90)
GFR calc non Af Amer: 71 mL/min — ABNORMAL LOW (ref >90)

## 2011-07-19 LAB — MAGNESIUM
Magnesium: 2.7 mg/dL — ABNORMAL HIGH (ref 1.5–2.5)
Magnesium: 2.2 mg/dL (ref 1.5–2.5)

## 2011-07-19 MED ORDER — METOPROLOL TARTRATE 25 MG/10 ML ORAL SUSPENSION: 12.5000 mg | Freq: Two times a day (BID) | ORAL | Status: DC

## 2011-07-19 MED ORDER — INSULIN ASPART 100 UNIT/ML ~~LOC~~ SOLN
0.0000 [IU] | SUBCUTANEOUS | Status: DC
  Administered 2011-07-19 (×5): 2 [IU] via SUBCUTANEOUS
  Filled 2011-07-19: qty 3

## 2011-07-19 MED ORDER — PANTOPRAZOLE SODIUM 40 MG PO TBEC
40.0000 mg | DELAYED_RELEASE_TABLET | Freq: Every day | ORAL | Status: DC
  Administered 2011-07-19: 40 mg via ORAL
  Filled 2011-07-19: qty 1

## 2011-07-19 MED ORDER — MUPIROCIN 2 % EX OINT
TOPICAL_OINTMENT | Freq: Two times a day (BID) | CUTANEOUS | Status: AC
  Administered 2011-07-19 – 2011-07-23 (×8): via NASAL
  Filled 2011-07-19: qty 22

## 2011-07-19 MED ORDER — METOPROLOL TARTRATE 25 MG PO TABS
25.0000 mg | ORAL_TABLET | Freq: Two times a day (BID) | ORAL | Status: DC
  Administered 2011-07-19 – 2011-07-21 (×5): 25 mg via ORAL
  Filled 2011-07-19 (×8): qty 1

## 2011-07-19 MED FILL — Magnesium Sulfate Inj 50%: INTRAMUSCULAR | Qty: 10 | Status: AC

## 2011-07-19 MED FILL — Metoprolol Tartrate Tab 25 MG: ORAL | Qty: 1 | Status: AC

## 2011-07-19 MED FILL — Dextrose Inj 5%: INTRAVENOUS | Qty: 250 | Status: AC

## 2011-07-19 MED FILL — Phenylephrine HCl Inj 10 MG/ML: INTRAMUSCULAR | Qty: 2 | Status: AC

## 2011-07-19 MED FILL — Potassium Chloride Inj 2 mEq/ML: INTRAVENOUS | Qty: 40 | Status: AC

## 2011-07-19 NOTE — Progress Notes (Signed)
[facility] DAILY PROGRESS NOTE                   [address]            [place] [address]          [phone]      1 Day Post-Op Procedure(s) (LRB): AORTIC VALVE REPLACEMENT (AVR) (N/A) CORONARY ARTERY BYPASS GRAFTING (CABG) (N/A)  Total Length of Stay:  LOS: 1 day   Subjective: Awake and alert extubated now no complaints  Objective: Vital signs in last 24 hours: Patient Vitals for the past 24 hrs:  BP Temp Temp src Pulse Resp SpO2 Weight  07/19/11 0800 101/66 mmHg 99.1 F (37.3 C) Core 91  24  95 % -  07/19/11 0700 104/70 mmHg 99.5 F (37.5 C) Core 90  17  96 % -  07/19/11 0600 99/63 mmHg 99.1 F (37.3 C) Core 103  21  95 % -  07/19/11 0500 102/77 mmHg 99 F (37.2 C) Core 90  16  97 % 221 lb 5.5 oz (100.4 kg)  07/19/11 0450 - 98.8 F (37.1 C) - 90  17  96 % -  07/19/11 0400 103/73 mmHg 98.8 F (37.1 C) Core 90  13  96 % -  07/19/11 0300 97/73 mmHg 98.8 F (37.1 C) Core 90  12  95 % -  07/19/11 0200 93/70 mmHg 98.6 F (37 C) Core 90  11  96 % -  07/19/11 0145 96/60 mmHg 98.6 F (37 C) - 90  10  95 % -  07/19/11 0130 98/62 mmHg 98.6 F (37 C) - 89  11  96 % -  07/19/11 0115 101/85 mmHg 98.6 F (37 C) - 89  11  96 % -  07/19/11 0100 98/73 mmHg 98.8 F (37.1 C) Core 90  12  96 % -  07/19/11 0045 100/61 mmHg 98.8 F (37.1 C) - 90  10  96 % -  07/19/11 0030 107/69 mmHg 98.6 F (37 C) - 90  23  98 % -  07/19/11 0015 99/65 mmHg 98.2 F (36.8 C) - 90  12  96 % -  07/19/11 0000 95/65 mmHg 98.2 F (36.8 C) Core 90  9  95 % -  07/18/11 2345 105/65 mmHg 98.4 F (36.9 C) - 90  12  96 % -  07/18/11 2330 94/64 mmHg 98.6 F (37 C) - 90  10  94 % -  07/18/11 2315 89/62 mmHg 98.6 F (37 C) - 90  10  94 % -  07/18/11 2300 92/57 mmHg 99 F (37.2 C) Core 90  9  94 % -  07/18/11 2245 108/90 mmHg 99 F (37.2 C) - 90  17  97 % -  07/18/11 2230 111/61 mmHg 99 F (37.2 C) - 90  18  96 % -  07/18/11 2219 98/60 mmHg 99.1 F (37.3 C) - 87  17  97 % -  07/18/11  2215 88/65 mmHg 99.1 F (37.3 C) - 90  28  98 % -  07/18/11 2200 102/77 mmHg 99.1 F (37.3 C) Core 89  25  98 % -  07/18/11 2145 - 99.1 F (37.3 C) - 89  19  99 % -  07/18/11 2140 98/68 mmHg 99.3 F (37.4 C) - 90  23  98 % -  07/18/11 2130 95/72 mmHg 99.3 F (37.4 C) - 90  23  99 % -  07/18/11 2115 97/64 mmHg 99.3  F (37.4 C) - 89  22  98 % -  07/18/11 2102 94/61 mmHg 99.5 F (37.5 C) - 90  20  97 % -  07/18/11 2100 93/69 mmHg 99.3 F (37.4 C) - 89  17  98 % -  07/18/11 2045 87/67 mmHg 99.3 F (37.4 C) - 89  16  99 % -  07/18/11 2042 90/52 mmHg 99.5 F (37.5 C) - 90  22  100 % -  07/18/11 2030 93/52 mmHg 99.5 F (37.5 C) - 80  15  99 % -  07/18/11 2015 89/72 mmHg 99.5 F (37.5 C) - 90  12  99 % -  07/18/11 2000 93/61 mmHg 99.5 F (37.5 C) - 90  13  100 % -  07/18/11 1945 - 99.3 F (37.4 C) - 90  13  100 % -  07/18/11 1930 - 99.3 F (37.4 C) - 84  12  99 % -  07/18/11 1915 - 99.3 F (37.4 C) - 90  15  100 % -  07/18/11 1900 - 99.3 F (37.4 C) - 89  12  100 % -  07/18/11 1845 - 99.3 F (37.4 C) - 90  13  100 % -  07/18/11 1830 - 99.3 F (37.4 C) - 89  20  99 % -  07/18/11 1815 - 99.3 F (37.4 C) - 90  24  99 % -  07/18/11 1800 - 99.5 F (37.5 C) - 90  13  100 % -  07/18/11 1745 - 99.3 F (37.4 C) - 90  15  100 % -  07/18/11 1730 - 99.1 F (37.3 C) - 89  22  100 % -  07/18/11 1715 - 98.8 F (37.1 C) - 90  12  100 % -  07/18/11 1700 - 98.4 F (36.9 C) - 90  12  100 % -  07/18/11 1645 - 98.1 F (36.7 C) - 90  13  100 % -  07/18/11 1630 98/72 mmHg 97.7 F (36.5 C) - 90  10  100 % -  07/18/11 1618 - 97.5 F (36.4 C) - 90  12  100 % -  07/18/11 1615 107/74 mmHg 97.5 F (36.4 C) - 90  11  100 % -  07/18/11 1600 89/61 mmHg 97 F (36.1 C) - 90  7  100 % -  07/18/11 1549 - 97 F (36.1 C) - 90  12  100 % -  07/18/11 1545 99/71 mmHg 96.8 F (36 C) - 90  13  100 % -  07/18/11 1536 - 96.6 F (35.9 C) - 90  12  100 % -  07/18/11 1530 - 96.4 F (35.8 C) - 89  12  100  % -  07/18/11 1515 - 96.1 F (35.6 C) - 89  12  100 % -  07/18/11 1500 115/81 mmHg 95.9 F (35.5 C) - 90  12  100 % -  07/18/11 1448 - 95.7 F (35.4 C) - 84  9  98 % -  07/18/11 1445 - 95.7 F (35.4 C) - 90  13  98 % -  07/18/11 1430 - 95.7 F (35.4 C) - 90  12  99 % -  07/18/11 1415 - 95.7 F (35.4 C) - 88  12  100 % -  07/18/11 1400 - - - 90  13  98 % -  07/18/11 1350 - - - 90  14  97 % -   Wt Readings from Last  3 Encounters:  07/19/11 221 lb 5.5 oz (100.4 kg)  07/19/11 221 lb 5.5 oz (100.4 kg)  07/14/11 215 lb 2.7 oz (97.6 kg)    Hemodynamic parameters for last 24 hours: PAP: (19-46)/(6-27) 29/15 mmHg CO:  [3.2 L/min-6.4 L/min] 5.2 L/min CI:  [1.4 L/min/m2-2.9 L/min/m2] 2.4 L/min/m2  Intake/Output from previous day: 12/18 0701 - 12/19 0700 In: 6173.5 [P.O.:90; I.V.:3473.5; Blood:800; NG/GT:30; IV Piggyback:1780] Out: 5715 [Urine:3695; Blood:1600; Chest Tube:420]  Intake/Output this shift:    Current Meds: Scheduled Meds:   . acetaminophen (TYLENOL) oral liquid 160 mg/5 mL  650 mg Per Tube NOW   Or  . acetaminophen  650 mg Rectal NOW  . acetaminophen  1,000 mg Oral Q6H   Or  . acetaminophen (TYLENOL) oral liquid 160 mg/5 mL  975 mg Per Tube Q6H  . aspirin EC  325 mg Oral Daily   Or  . aspirin  324 mg Per Tube Daily  . bisacodyl  10 mg Oral Daily   Or  . bisacodyl  10 mg Rectal Daily  . cefUROXime (ZINACEF)  IV  1.5 g Intravenous On Call  . cefUROXime (ZINACEF)  IV  1.5 g Intravenous Q12H  . Chlorhexidine Gluconate Cloth  6 each Topical Q0600  . citalopram  40 mg Oral Daily  . dexmedetomidine (PRECEDEX) IV infusion  0.4-1.2 mcg/kg/hr Intravenous To OR  . docusate sodium  200 mg Oral Daily  . famotidine (PEPCID) IV  20 mg Intravenous Q12H  . heparin-papaverine-plasmalyte irrigation   Irrigation To OR  . magnesium sulfate  4 g Intravenous Once  . metoprolol tartrate  12.5 mg Oral BID   Or  . metoprolol tartrate  12.5 mg Per Tube BID  . mupirocin cream    Topical BID  . pantoprazole  40 mg Oral Q1200  . potassium chloride  10 mEq Intravenous Q1 Hr x 3  . sodium bicarbonate  50 mEq Intravenous Once  . sodium bicarbonate  50 mEq Intravenous Once  . sodium chloride  3 mL Intravenous Q12H  . vancomycin (VANCOCIN) IVPB 1000 mg/100 mL central line  1,000 mg Intravenous Once  . DISCONTD: aminocaproic acid (AMICAR) for OHS   Intravenous To OR  . DISCONTD: cefUROXime (ZINACEF)  IV  750 mg Intravenous To OR  . DISCONTD: chlorhexidine  30 mL Topical UD  . DISCONTD: DOPamine  2-20 mcg/kg/min Intravenous To OR  . DISCONTD: epinephrine  0.5-20 mcg/min Intravenous To OR  . DISCONTD: magnesium sulfate  40 mEq Other To OR  . DISCONTD: metoprolol tartrate  12.5 mg Oral Once  . DISCONTD: nitroGLYCERIN  2-200 mcg/min Intravenous To OR  . DISCONTD: phenylephrine (NEO-SYNEPHRINE) Adult infusion  30-200 mcg/min Intravenous To OR  . DISCONTD: potassium chloride  80 mEq Other To OR   Continuous Infusions:   . sodium chloride 20 mL/hr at 07/19/11 0700  . sodium chloride    . sodium chloride    . dexmedetomidine (PRECEDEX) IV infusion Stopped (07/18/11 1735)  . insulin (NOVOLIN-R) infusion 1.7 mL/hr at 07/19/11 0530  . lactated ringers 20 mL/hr at 07/19/11 0700  . nitroGLYCERIN 0 mcg/min (07/18/11 1445)  . phenylephrine (NEO-SYNEPHRINE) Adult infusion 10 mcg/min (07/19/11 0610)   PRN Meds:.albumin human, albumin human, fentaNYL, lactated ringers, metoprolol, midazolam, ondansetron (ZOFRAN) IV, oxyCODONE, sodium chloride, DISCONTD: fentaNYL, DISCONTD: microfibrllar collagen, DISCONTD:  morphine injection, DISCONTD: morphine, DISCONTD: papaverine, DISCONTD: Surgifoam 1 Gm with 0.9% sodium chloride (4 ml) topical solution  General appearance: alert, cooperative and no distress Neurologic: intact Heart: regular  rate and rhythm, S1, S2 normal, no murmur, click, rub or gallop and regular rate and rhythm no Murmur of AI Lungs: clear to auscultation  bilaterally Abdomen: soft, non-tender; bowel sounds normal; no masses,  no organomegaly Extremities: extremities normal, atraumatic, no cyanosis or edema and Homans sign is negative, no sign of DVT  Lab Results: CBC: Basename 07/19/11 0425 07/18/11 2035 07/18/11 2015  WBC 14.6* -- 11.8*  HGB 11.4* 12.6* --  HCT 34.0* 37.0* --  PLT 124* -- 121*   BMET:  Basename 07/19/11 0425 07/18/11 2035  NA 143 141  K 4.3 4.4  CL 109 109  CO2 25 --  GLUCOSE 124* 114*  BUN 15 13  CREATININE 1.09 1.10  CALCIUM 7.9* --    PT/INR:  Basename 07/18/11 1400  LABPROT 19.1*  INR 1.57*   Radiology: Dg Chest Portable 1 View In Am  07/19/2011  *RADIOLOGY REPORT*  Clinical Data: Postop cardiotomy  PORTABLE CHEST - 1 VIEW  Comparison: Chest radiograph 07/18/2011  Findings: Interval extubation and removal of NG tube.  Swan-Ganz catheter remains with tip in the right main pulmonary artery.  Left chest tube and mediastinal drain are unchanged.  There is mild basilar atelectasis.  No pulmonary edema.  No pneumothorax.  IMPRESSION: Extubation without complication.   No pulmonary edema.  Original Report Authenticated By: Genevive Bi, M.D.   Dg Chest Portable 1 View  07/18/2011  *RADIOLOGY REPORT*  Clinical Data: Status post aortic valve replacement surgery.  PORTABLE CHEST - 1 VIEW  Comparison: 07/14/2011  Findings: Endotracheal tube present with tip approximately 3 cm above the carina.  Swan-Ganz catheter extends into the main pulmonary outflow tract.  Additional catheter in the midline represents either aortic balloon pump or mediastinal drain.  Left chest tube present.  Prosthetic aortic valve visualized. Nasogastric tube extends into the stomach.  No evidence of pneumothorax or pulmonary edema.  Bilateral lower lobe atelectasis present, left greater than right.  No significant pleural effusions.  Heart size and mediastinal contours are unremarkable.  IMPRESSION: Bilateral lower lobe atelectasis, left greater  than right.  No pneumothorax.  Original Report Authenticated By: Reola Calkins, M.D.     Assessment/Plan: S/P Procedure(s) (LRB): AORTIC VALVE REPLACEMENT (AVR) (N/A) CORONARY ARTERY BYPASS GRAFTING (CABG) (N/A) Mobilize Diuresis Diabetes control See progression orders     Delight Ovens MD  Beeper [phone] Office [phone] 07/19/2011 8:37 AM

## 2011-07-19 NOTE — Progress Notes (Signed)
Dr. Laneta Simmers aware of 1 hour post-extubation results: pH: 7.266, CO2: 54.2, pO2: 78, Be-3, HCO3: 24.6, tCO2: 26, sO2: 93. Order received to give another amp of bicarb. Also order received to administer up to 2 more albumin PRN. Will continue to closely monitor.

## 2011-07-19 NOTE — Progress Notes (Signed)
Social Visit: pt known to me after cardiac cath procedure last week. He is s/p AVR and CABG with LIMA-LCx. He is progressing well day 1 after surgery. Appreciate Dr Dennie Maizes care.

## 2011-07-19 NOTE — Plan of Care (Signed)
Problem: Phase II Progression Outcomes Goal: Tolerates weaning with O2 Sat > 90 Outcome: Progressing On 1 L O2. Goal: CBGs/Blood glucose < or equal to 120 Outcome: Progressing Off insulin drip and on SSI every 4 hours Goal: Tolerates liquids without nausea/vomiting Outcome: Completed/Met Date Met:  07/19/11 Advanced to Carb modified diet Goal: Patient extubated within - Outcome: Completed/Met Date Met:  07/19/11 Within 12 hours

## 2011-07-19 NOTE — Progress Notes (Signed)
Patient ID: [name], male   DOB: November 03, 1950, 60 y.o.   MRN: [id number] Comfortable BP 120/68  Pulse 75  Temp(Src) 99.3 F (37.4 C) (Oral)  Resp 22  Wt 100.4 kg (221 lb 5.5 oz)  SpO2 98%  1005 in / 575 out CBC    Component Value Date/Time   WBC 14.1* 07/19/2011 1755   RBC 3.44* 07/19/2011 1755   HGB 10.4* 07/19/2011 1755   HCT 31.7* 07/19/2011 1755   PLT 96* 07/19/2011 1755   MCV 92.2 07/19/2011 1755   MCH 30.2 07/19/2011 1755   MCHC 32.8 07/19/2011 1755   RDW 13.5 07/19/2011 1755   LYMPHSABS 2.8 07/06/2011 1452   MONOABS 0.7 07/06/2011 1452   EOSABS 0.1 07/06/2011 1452   BASOSABS 0.0 07/06/2011 1452    Creatinine 1.1  Stable day, continue present care

## 2011-07-20 ENCOUNTER — Inpatient Hospital Stay (HOSPITAL_COMMUNITY): Payer: 59

## 2011-07-20 LAB — CBC
HCT: 31.5 % — ABNORMAL LOW (ref 39.0–52.0)
Hemoglobin: 10.4 g/dL — ABNORMAL LOW (ref 13.0–17.0)
MCH: 30.4 pg (ref 26.0–34.0)
MCHC: 33 g/dL (ref 30.0–36.0)
MCV: 92.1 fL (ref 78.0–100.0)
Platelets: 89 10*3/uL — ABNORMAL LOW (ref 150–400)
RBC: 3.42 MIL/uL — ABNORMAL LOW (ref 4.22–5.81)
RDW: 13.4 % (ref 11.5–15.5)
WBC: 15.5 10*3/uL — ABNORMAL HIGH (ref 4.0–10.5)

## 2011-07-20 LAB — GLUCOSE, CAPILLARY
Glucose-Capillary: 107 mg/dL — ABNORMAL HIGH (ref 70–99)
Glucose-Capillary: 118 mg/dL — ABNORMAL HIGH (ref 70–99)
Glucose-Capillary: 118 mg/dL — ABNORMAL HIGH (ref 70–99)
Glucose-Capillary: 110 mg/dL — ABNORMAL HIGH (ref 70–99)
Glucose-Capillary: 109 mg/dL — ABNORMAL HIGH (ref 70–99)

## 2011-07-20 LAB — BASIC METABOLIC PANEL
BUN: 17 mg/dL (ref 6–23)
CO2: 27 mEq/L (ref 19–32)
Calcium: 8 mg/dL — ABNORMAL LOW (ref 8.4–10.5)
Chloride: 99 mEq/L (ref 96–112)
Creatinine, Ser: 1.01 mg/dL (ref 0.50–1.35)
GFR calc Af Amer: >90 mL/min (ref >90)
GFR calc non Af Amer: 80 mL/min — ABNORMAL LOW (ref >90)
Glucose, Bld: 113 mg/dL — ABNORMAL HIGH (ref 70–99)
Potassium: 4.1 mEq/L (ref 3.5–5.1)
Sodium: 131 mEq/L — ABNORMAL LOW (ref 135–145)

## 2011-07-20 MED ORDER — ONDANSETRON HCL 4 MG/2ML IJ SOLN: 4.0000 mg | Freq: Four times a day (QID) | INTRAMUSCULAR | Status: DC | PRN

## 2011-07-20 MED ORDER — TRAMADOL HCL 50 MG PO TABS
50.0000 mg | ORAL_TABLET | ORAL | Status: DC | PRN
  Administered 2011-07-20: 50 mg via ORAL
  Administered 2011-07-22 – 2011-07-27 (×12): 100 mg via ORAL
  Filled 2011-07-20 (×8): qty 2
  Filled 2011-07-20: qty 1
  Filled 2011-07-20 (×4): qty 2

## 2011-07-20 MED ORDER — MAGNESIUM HYDROXIDE 400 MG/5ML PO SUSP: 30.0000 mL | Freq: Every day | ORAL | Status: DC | PRN

## 2011-07-20 MED ORDER — ONDANSETRON HCL 4 MG PO TABS
4.0000 mg | ORAL_TABLET | Freq: Four times a day (QID) | ORAL | Status: DC | PRN
  Administered 2011-07-20 (×2): 4 mg via ORAL
  Filled 2011-07-20: qty 1

## 2011-07-20 MED ORDER — SODIUM CHLORIDE 0.9 % IJ SOLN
3.0000 mL | Freq: Two times a day (BID) | INTRAMUSCULAR | Status: DC
  Administered 2011-07-20 – 2011-07-26 (×13): 3 mL via INTRAVENOUS

## 2011-07-20 MED ORDER — SODIUM CHLORIDE 0.9 % IJ SOLN: 3.0000 mL | INTRAMUSCULAR | Status: DC | PRN

## 2011-07-20 MED ORDER — ASPIRIN EC 325 MG PO TBEC
325.0000 mg | DELAYED_RELEASE_TABLET | Freq: Every day | ORAL | Status: DC
  Administered 2011-07-21 – 2011-07-23 (×3): 325 mg via ORAL
  Filled 2011-07-20 (×5): qty 1

## 2011-07-20 MED ORDER — OXYCODONE HCL 5 MG PO TABS
5.0000 mg | ORAL_TABLET | ORAL | Status: DC | PRN
  Administered 2011-07-20 (×2): 5 mg via ORAL
  Administered 2011-07-22 (×3): 10 mg via ORAL
  Filled 2011-07-20: qty 2
  Filled 2011-07-20: qty 1
  Filled 2011-07-20 (×2): qty 2

## 2011-07-20 MED ORDER — INSULIN ASPART 100 UNIT/ML ~~LOC~~ SOLN
0.0000 [IU] | Freq: Three times a day (TID) | SUBCUTANEOUS | Status: DC
  Administered 2011-07-21: 2 [IU] via SUBCUTANEOUS
  Filled 2011-07-20: qty 3

## 2011-07-20 MED ORDER — BISACODYL 10 MG RE SUPP: 10.0000 mg | Freq: Every day | RECTAL | Status: DC | PRN

## 2011-07-20 MED ORDER — BISACODYL 5 MG PO TBEC: 10.0000 mg | DELAYED_RELEASE_TABLET | Freq: Every day | ORAL | Status: DC | PRN

## 2011-07-20 MED ORDER — SODIUM CHLORIDE 0.9 % IV SOLN: 250.0000 mL | INTRAVENOUS | Status: DC | PRN

## 2011-07-20 MED ORDER — DOCUSATE SODIUM 100 MG PO CAPS
200.0000 mg | ORAL_CAPSULE | Freq: Every day | ORAL | Status: DC
  Administered 2011-07-21 – 2011-07-27 (×7): 200 mg via ORAL
  Filled 2011-07-20 (×7): qty 2

## 2011-07-20 MED ORDER — PANTOPRAZOLE SODIUM 40 MG PO TBEC
40.0000 mg | DELAYED_RELEASE_TABLET | Freq: Every day | ORAL | Status: DC
  Administered 2011-07-21 – 2011-07-27 (×7): 40 mg via ORAL
  Filled 2011-07-20 (×8): qty 1

## 2011-07-20 MED ORDER — ENALAPRIL MALEATE 5 MG PO TABS
5.0000 mg | ORAL_TABLET | Freq: Every day | ORAL | Status: DC
  Administered 2011-07-20 – 2011-07-27 (×8): 5 mg via ORAL
  Filled 2011-07-20 (×8): qty 1

## 2011-07-20 MED ORDER — MOVING RIGHT ALONG BOOK
Freq: Once | Status: AC
  Administered 2011-07-20: 10:00:00
  Filled 2011-07-20: qty 1

## 2011-07-20 MED ORDER — ACETAMINOPHEN 325 MG PO TABS
650.0000 mg | ORAL_TABLET | Freq: Four times a day (QID) | ORAL | Status: DC | PRN
  Administered 2011-07-21 (×2): 650 mg via ORAL
  Filled 2011-07-20 (×2): qty 2

## 2011-07-20 MED ORDER — POVIDONE-IODINE 10 % EX SOLN
1.0000 "application " | Freq: Two times a day (BID) | CUTANEOUS | Status: DC
  Administered 2011-07-20 – 2011-07-27 (×15): 1 via TOPICAL
  Filled 2011-07-20: qty 15

## 2011-07-20 MED ORDER — ROSUVASTATIN CALCIUM 10 MG PO TABS
10.0000 mg | ORAL_TABLET | Freq: Every day | ORAL | Status: DC
  Administered 2011-07-20 – 2011-07-26 (×7): 10 mg via ORAL
  Filled 2011-07-20 (×8): qty 1

## 2011-07-20 MED FILL — Heparin Sodium (Porcine) Inj 1000 Unit/ML: INTRAMUSCULAR | Qty: 60 | Status: AC

## 2011-07-20 MED FILL — Sodium Chloride IV Soln 0.9%: INTRAVENOUS | Qty: 1000 | Status: AC

## 2011-07-20 MED FILL — Electrolyte-R (PH 7.4) Solution: INTRAVENOUS | Qty: 6000 | Status: AC

## 2011-07-20 MED FILL — Sodium Chloride Irrigation Soln 0.9%: Qty: 3000 | Status: AC

## 2011-07-20 NOTE — Progress Notes (Signed)
Pt has walked x2 today and now feeling nauseated. Ed completed except ex gl with wife and dght. Will f/u in am to ambulate and finish ed. Requests his name be sent to [facility]. [phone]  Ethelda Chick CES, ACSM

## 2011-07-20 NOTE — Progress Notes (Signed)
Patient ID: [name], male   DOB: 1950-09-10, 60 y.o.   MRN: [id number]                   [address]            [place] [address]          [phone]     2 Days Post-Op Procedure(s) (LRB): AORTIC VALVE REPLACEMENT (AVR) (N/A) CORONARY ARTERY BYPASS GRAFTING (CABG) (N/A)  LOS: 2 days   Subjective: Patient feels well without complaints. Has been walking in the unit well.  Objective: Vital signs in last 24 hours: Patient Vitals for the past 24 hrs:  BP Temp Temp src Pulse Resp SpO2 Weight  07/20/11 0800 103/69 mmHg - - 69  19  91 % -  07/20/11 0740 - 98.6 F (37 C) Oral - - - -  07/20/11 0700 114/68 mmHg - - 70  23  92 % -  07/20/11 0600 125/77 mmHg - - 69  20  98 % 226 lb 10.1 oz (102.8 kg)  07/20/11 0500 119/72 mmHg - - 69  20  96 % -  07/20/11 0400 118/76 mmHg - - 69  23  99 % -  07/20/11 0352 - 98.5 F (36.9 C) Oral - - - -  07/20/11 0300 115/73 mmHg - - 69  21  96 % -  07/20/11 0200 106/72 mmHg - - 73  25  96 % -  07/20/11 0100 112/72 mmHg - - 71  23  95 % -  07/20/11 0000 113/72 mmHg 98.3 F (36.8 C) Oral 71  24  96 % -  07/19/11 2300 109/69 mmHg - - 73  29  97 % -  07/19/11 2200 111/70 mmHg - - 73  29  98 % -  07/19/11 2100 111/68 mmHg - - 80  18  98 % -  07/19/11 2000 115/67 mmHg - - 73  28  97 % -  07/19/11 1952 - 99.5 F (37.5 C) Oral - - - -  07/19/11 1928 - - - 81  18  98 % -  07/19/11 1900 117/51 mmHg - - 76  29  98 % -  07/19/11 1800 120/68 mmHg - - 75  22  98 % -  07/19/11 1700 108/53 mmHg - - 75  22  97 % -  07/19/11 1605 103/65 mmHg - - 76  22  95 % -  07/19/11 1600 72/47 mmHg - - 75  23  97 % -  07/19/11 1543 - 99.3 F (37.4 C) Oral - - - -  07/19/11 1500 107/70 mmHg - - 72  25  95 % -  07/19/11 1400 106/70 mmHg - - 88  28  92 % -  07/19/11 1300 94/54 mmHg - - 86  23  96 % -  07/19/11 1200 - - - 85  17  95 % -  07/19/11 1158 - 98.1 F (36.7 C) Oral - - - -  07/19/11 1100 - 99.5 F (37.5 C) - 85  18  95 % -  07/19/11 1030  - - - 90  24  94 % -  07/19/11 1000 - 99.5 F (37.5 C) - 96  21  95 % -   Wt Readings from Last 3 Encounters:  07/20/11 226 lb 10.1 oz (102.8 kg)  07/20/11 226 lb 10.1 oz (102.8 kg)  07/14/11 215 lb 2.7 oz (97.6 kg)    Hemodynamic  parameters for last 24 hours: PAP: (32-37)/(18-20) 37/20 mmHg  Intake/Output from previous day: 12/19 0701 - 12/20 0700 In: 1405.8 [P.O.:800; I.V.:555.8; IV Piggyback:50] Out: 1085 [Urine:955; Chest Tube:130] Intake/Output this shift: Total I/O In: 40 [I.V.:40] Out: 35 [Urine:35]  Scheduled Meds:   . acetaminophen  1,000 mg Oral Q6H   Or  . acetaminophen (TYLENOL) oral liquid 160 mg/5 mL  975 mg Per Tube Q6H  . aspirin EC  325 mg Oral Daily   Or  . aspirin  324 mg Per Tube Daily  . bisacodyl  10 mg Oral Daily   Or  . bisacodyl  10 mg Rectal Daily  . cefUROXime (ZINACEF)  IV  1.5 g Intravenous Q12H  . Chlorhexidine Gluconate Cloth  6 each Topical Q0600  . citalopram  40 mg Oral Daily  . docusate sodium  200 mg Oral Daily  . famotidine (PEPCID) IV  20 mg Intravenous Q12H  . insulin aspart  0-24 Units Subcutaneous Q4H  . metoprolol tartrate  25 mg Oral BID  . mupirocin ointment   Nasal BID  . pantoprazole  40 mg Oral Q1200  . sodium chloride  3 mL Intravenous Q12H  . DISCONTD: mupirocin cream   Topical BID  . DISCONTD: pantoprazole  40 mg Oral Q1200   Continuous Infusions:   . sodium chloride 20 mL/hr at 07/20/11 0800  . sodium chloride    . sodium chloride    . dexmedetomidine (PRECEDEX) IV infusion Stopped (07/18/11 1735)  . insulin (NOVOLIN-R) infusion Stopped (07/19/11 1100)  . lactated ringers Stopped (07/19/11 1100)  . nitroGLYCERIN 0 mcg/min (07/18/11 1445)  . phenylephrine (NEO-SYNEPHRINE) Adult infusion Stopped (07/19/11 0745)   PRN Meds:.albumin human, fentaNYL, metoprolol, midazolam, ondansetron (ZOFRAN) IV, oxyCODONE, sodium chloride  General appearance: alert, cooperative and no distress Neurologic: intact Heart:  regular rate and rhythm, S1, S2 normal, no murmur, click, rub or gallop and normal apical impulse no rub Lungs: clear to auscultation bilaterally Abdomen: soft, non-tender; bowel sounds normal; no masses,  no organomegaly Extremities: extremities normal, atraumatic, no cyanosis or edema and Homans sign is negative, no sign of DVT Wound: sternum stable  Lab Results: CBC: Basename 07/20/11 0355 07/19/11 1755  WBC 15.5* 14.1*  HGB 10.4* 10.4*  HCT 31.5* 31.7*  PLT 89* 96*   BMET:  Basename 07/20/11 0355 07/19/11 1755 07/19/11 1754 07/19/11 0425  NA 131* -- 136 --  K 4.1 -- 4.1 --  CL 99 -- 100 --  CO2 27 -- -- 25  GLUCOSE 113* -- 139* --  BUN 17 -- 18 --  CREATININE 1.01 1.11 -- --  CALCIUM 8.0* -- -- 7.9*    PT/INR:  Basename 07/18/11 1400  LABPROT 19.1*  INR 1.57*     Radiology Dg Chest Portable 1 View In Am  07/20/2011  *RADIOLOGY REPORT*  Clinical Data: Postop cardiac surgery.  PORTABLE CHEST - 1 VIEW  Comparison: 07/19/2011.  Findings: Right IJ catheter sheath remains in place.  Swan-Ganz catheter has been removed.  Mediastinal drain and left chest tube have been removed as well.  Epicardial pacer wires remain in place. Sternotomy wires are unchanged in position.  Heart is enlarged, stable.  There is perihilar and bibasilar air space disease, left greater than right.  No definite pneumothorax. Difficult to exclude left pleural fluid.  IMPRESSION: Perihilar and bibasilar air space disease, left greater than right. Difficult to exclude left pleural effusion.  No pneumothorax.  Original Report Authenticated By: Reyes Ivan, M.D.   Dg  Chest Portable 1 View In Am  07/19/2011  *RADIOLOGY REPORT*  Clinical Data: Postop cardiotomy  PORTABLE CHEST - 1 VIEW  Comparison: Chest radiograph 07/18/2011  Findings: Interval extubation and removal of NG tube.  Swan-Ganz catheter remains with tip in the right main pulmonary artery.  Left chest tube and mediastinal drain are unchanged.   There is mild basilar atelectasis.  No pulmonary edema.  No pneumothorax.  IMPRESSION: Extubation without complication.   No pulmonary edema.  Original Report Authenticated By: Genevive Bi, M.D.   Dg Chest Portable 1 View  07/18/2011  *RADIOLOGY REPORT*  Clinical Data: Status post aortic valve replacement surgery.  PORTABLE CHEST - 1 VIEW  Comparison: 07/14/2011  Findings: Endotracheal tube present with tip approximately 3 cm above the carina.  Swan-Ganz catheter extends into the main pulmonary outflow tract.  Additional catheter in the midline represents either aortic balloon pump or mediastinal drain.  Left chest tube present.  Prosthetic aortic valve visualized. Nasogastric tube extends into the stomach.  No evidence of pneumothorax or pulmonary edema.  Bilateral lower lobe atelectasis present, left greater than right.  No significant pleural effusions.  Heart size and mediastinal contours are unremarkable.  IMPRESSION: Bilateral lower lobe atelectasis, left greater than right.  No pneumothorax.  Original Report Authenticated By: Reola Calkins, M.D.     Assessment/Plan: S/P Procedure(s) (LRB): AORTIC VALVE REPLACEMENT (AVR) (N/A) CORONARY ARTERY BYPASS GRAFTING (CABG) (N/A) Mobilize Diuresis Plan for transfer to step-down: see transfer orders Mild Thrombocytopenia  Delight Ovens MD 07/20/2011 9:21 AM

## 2011-07-20 NOTE — Op Note (Signed)
NAMEMARVEL, MCPHILLIPS NO.:  [id number]  MEDICAL RECORD NO.:  [id number]  LOCATION:  2309                         FACILITY:  [facility]  PHYSICIAN:  Sheliah Plane, MD    DATE OF BIRTH:  1950-09-05  DATE OF PROCEDURE:  07/18/2011 DATE OF DISCHARGE:                              OPERATIVE REPORT   PREOPERATIVE DIAGNOSIS:  Severe aortic stenosis and coronary occlusive disease.  POSTOPERATIVE DIAGNOSIS:  Severe aortic stenosis and coronary occlusive disease.  SURGICAL PROCEDURE:  Aortic valve replacement with a pericardial tissue valve, 23 mm, [employer], model 3300TFX, serial [id number] and coronary artery bypass grafting with the left internal mammary to the circumflex coronary artery.  SURGEON:  Sheliah Plane, M.D.  FIRST ASSISTANT:  Rowe Clack, P.A.-C.  BRIEF HISTORY:  The patient is a 60 year old male who has had a long- term history of a murmur, recently began having increasing episodes of shortness of breath and was evaluated by Cardiology.  Echocardiogram revealed estimated valve area of 0.8 with a velocity of over 4 m/sec across the aortic valve.  The valve appeared to be bicuspid by echo. Cardiac catheterization was done by Dr. Excell Seltzer which showed luminal irregularities of the right coronary artery and the LAD, but no high- grade stenosis and a 60% ostial circumflex lesion and a proximal circumflex lesion of greater than 80%.  The patient had a previous CT scan in 2009 which suggested dilatation of the ascending aorta.  Repeat scan was performed preoperatively, showing unchanged ascending aorta at approximately 4 cm.  The risks and options of surgery and aortic valve replacement were discussed with the patient in detail, including the use of mechanical versus tissue valve and use of Coumadin.  The patient preferred a tissue valve and did not wish to take Coumadin.  He agreed and signed informed consent.  DESCRIPTION OF PROCEDURE:  With  Swan-Ganz and arterial line monitors in place, the patient underwent general endotracheal anesthesia without incident.  Skin of the chest and legs was prepped with Betadine and draped in usual sterile manner.  The patient had previously had a right femoral popliteal bypass with vein from the right leg.  This was clotted in the past.  A TEE probe was placed by Dr. Sondra Come and description of the valve was dictated under a separate note, but it did confirm critical aortic stenosis with left ventricular hypertrophy and preserved LV function.  The skin of chest and legs was prepped with Betadine and draped in usual sterile manner.  Median sternotomy was performed.  Left internal mammary artery was dissected down as pedicle graft.  The distal artery was divided and had good free flow.  Pericardium was opened.  The patient had evidence of severe left ventricular hypertrophy.  The aorta did not appear particularly thinned and was consistent with the findings of the CT, by TEE measurement, the largest size was 3.8 cm.  It was decided not to replace the ascending aorta.  The patient was systemically heparinized.  The ascending aorta was cannulated.  The right atrium was cannulated.  Retrograde cardioplegic catheter was placed.  The patient was placed on cardiopulmonary bypass 2.4 L/minute/m squared.  Sites anastomosed were selected  and dissected out of epicardium in the distribution of the circumflex.  The patient's body temperature was cooled to 32 degrees.  Aortic crossclamp was applied and 500 mL of cold-blood cardioplegia was administered with diastolic arrest of the heart.  Myocardial septal temperatures monitored throughout the crossclamp.  Attention was turned first to the circumflex coronary artery.  The heart was elevated.  The vessel was opened.  Then using a running 8-0 Prolene, left internal mammary artery was anastomosed to left anterior descending coronary artery.  The bulldog was left on  the mammary artery.  Briefly removing it, showed anastomosis to be intact without bleeding and the bulldog was placed back on the mammary artery and fascial stitches were used to tack the mammary fascia to the epicardium.  Attention was then turned to the aortic valve.  A transverse aortotomy was performed.  There was some calcification along the sinotubular ridge and the valve was highly calcified, but in fact appeared to be a tricuspid valve with 3 well-formed hyphae with predominant calcification and fusion of the left and right coronary cusp.  The valve was excised and anulus debrided of calcium.  A 23 pericardial valve was selected, #2 Tycron pledgeted sutures in a circumferential manner were placed in the anulus and used to secure the valve in place.  The valve seated well.  Care was taken to remove all loose calcific debris.  The aortotomy was then closed with a horizontal mattress 3-0 Prolene sutures over felt strips and intermittently cold- blood cardioplegia was administered.  The heart was allowed to passively fill, de-aired, and the aortotomy was completed.  The aortic root vent cardioplegia needle was used to further de-air the heart as the aortic crossclamp was removed.  Total crossclamp time was 114 minutes.  The patient required electric defibrillation to return to a sinus rhythm. Atrial and ventricular pacing wires were placed.  He was then ventilated.  The right superior pulmonary vein vent was removed.  TEE showed good functioning of the valve.  He was then ventilated and weaned from cardiopulmonary bypass without difficulty remaining hemodynamically stable.  He was decannulated in usual fashion.  Protamine sulfate was administered with operative field hemostatic.  A left pleural tube and a Blake mediastinal drain were left in place.  Pericardium was loosely reapproximated.  Sternum was closed with #6 stainless steel wire. Fascia was closed with interrupted 0 Vicryl,  running 3-0 Vicryl and subcutaneous tissue with 4-0 subcuticular stitch in skin edges.  Dry dressings were applied.  Sponge and needle counts reported as correct at completion of the procedure.  The patient tolerated the procedure without obvious complication and was transferred to Surgical Intensive Care Unit for further postoperative care.  No banked blood was used.     Sheliah Plane, MD     EG/MEDQ  D:  07/19/2011  T:  07/19/2011  Job:  [id number]  cc:   Veverly Fells. Excell Seltzer, MD

## 2011-07-20 NOTE — Plan of Care (Signed)
Problem: Phase III Progression Outcomes Goal: Time patient transferred to PCTU/Telemetry POD Outcome: Completed/Met Date Met:  07/20/11 1130

## 2011-07-20 NOTE — Progress Notes (Signed)
Patient stated that he has a codeine allergy that drops his blood pressure. 5mg  doses have been given to patient previously, but is having little relief with this dose. Okay to given full 10mg  dose for pain per Dr. Dorris Fetch despite allergy.

## 2011-07-21 LAB — BASIC METABOLIC PANEL
BUN: 17 mg/dL (ref 6–23)
CO2: 30 mEq/L (ref 19–32)
Calcium: 8.7 mg/dL (ref 8.4–10.5)
Chloride: 94 mEq/L — ABNORMAL LOW (ref 96–112)
Creatinine, Ser: 0.96 mg/dL (ref 0.50–1.35)
GFR calc Af Amer: >90 mL/min (ref >90)
GFR calc non Af Amer: 90 mL/min — ABNORMAL LOW (ref >90)
Glucose, Bld: 117 mg/dL — ABNORMAL HIGH (ref 70–99)
Potassium: 3.6 mEq/L (ref 3.5–5.1)
Sodium: 130 mEq/L — ABNORMAL LOW (ref 135–145)

## 2011-07-21 LAB — CBC
HCT: 32.2 % — ABNORMAL LOW (ref 39.0–52.0)
Hemoglobin: 10.6 g/dL — ABNORMAL LOW (ref 13.0–17.0)
MCH: 30.2 pg (ref 26.0–34.0)
MCHC: 32.9 g/dL (ref 30.0–36.0)
MCV: 91.7 fL (ref 78.0–100.0)
Platelets: 115 10*3/uL — ABNORMAL LOW (ref 150–400)
RBC: 3.51 MIL/uL — ABNORMAL LOW (ref 4.22–5.81)
RDW: 13.5 % (ref 11.5–15.5)
WBC: 13.5 10*3/uL — ABNORMAL HIGH (ref 4.0–10.5)

## 2011-07-21 LAB — GLUCOSE, CAPILLARY
Glucose-Capillary: 139 mg/dL — ABNORMAL HIGH (ref 70–99)
Glucose-Capillary: 106 mg/dL — ABNORMAL HIGH (ref 70–99)
Glucose-Capillary: 96 mg/dL (ref 70–99)

## 2011-07-21 MED ORDER — ASPIRIN 325 MG PO TBEC: 325.0000 mg | DELAYED_RELEASE_TABLET | Freq: Every day | ORAL | Status: DC

## 2011-07-21 MED ORDER — OXYCODONE HCL 5 MG PO TABS: 5.0000 mg | ORAL_TABLET | ORAL | Status: AC | PRN

## 2011-07-21 MED ORDER — METOPROLOL TARTRATE 25 MG PO TABS: 25.0000 mg | ORAL_TABLET | Freq: Two times a day (BID) | ORAL | Status: DC

## 2011-07-21 MED ORDER — ROSUVASTATIN CALCIUM 10 MG PO TABS: 10.0000 mg | ORAL_TABLET | Freq: Every day | ORAL | Status: DC

## 2011-07-21 NOTE — Discharge Summary (Signed)
[address]            [place] [address]          [phone]      [name] 08-Aug-1950 60 y.o. [id number]  07/18/2011   Delight Ovens, MD  AORTIC STENOSIS  History of Present Illness: At the time of consultation: Patient is a 60 year old male who's had a known aortic murmur since age 78 followed intermittently over the years. In October 2012 he noted increasing shortness of breath with increasing activity. In at has had increasing fatigue with exertion he denies chest pain denies syncope or presyncope he said no peripheral edema. He does have a known dilated ascending aorta since CT scan done in 2009 up to 4.1 cm. He's been told in the past that he has a bicuspid aortic valve. He was admitted this hospitalization for cardiac catheterization to further delineate his anatomy in preparation for possible surgical intervention. He underwent cardiac catheterization on 07/11/2011 by Dr. [name]. The following was noted:  Procedural Findings:  Hemodynamics  RA 3  RV 26/6  PA 24/8 with a mean of 14  PCWP 8  LV 173/21  AO 124/72 with a mean of 93  Oxygen saturations:  PA 64%  AO 94%  Cardiac Output (Fick) 4.5 L per minute  Cardiac Index (Fick) 2 L/minute/meter square  Aortic valve hemodynamics:  Mean gradient 42, aortic valve area 0.76, aortic valve area index 0.34  Coronary angiography:  Coronary dominance: right  Left mainstem: Widely patent with no significant obstructive disease. There is a 20-30% stenosis in the mid shaft of the left main.  Left anterior descending (LAD): The LAD is a large-caliber vessel. The proximal LAD is tortuous. The mid LAD at the origin of the second diagonal branch has a 30-40% stenosis. The apical portion of the LAD is small in caliber. There are no high-grade stenoses throughout the course of the LAD.  Left circumflex (LCx): The left circumflex has 50-60% ostial stenosis. The mid circumflex has 80%  stenosis leading into a large second OM branch. The first obtuse marginal was very small in caliber.  Right coronary artery (RCA): The RCA is dominant. It is an ectatic vessel with diffuse irregularity but there are no areas of high-grade stenosis identified. The proximal RCA has 30% stenosis. The distal RCA just before the origin of the PDA branch has a 50% stenosis. The PDA and posterolateral branches have mild diffuse disease without focal high-grade stenosis.  Left ventriculography: Left ventricular systolic function is in the low-normal range. The left ventricular ejection fraction is estimated at 50-55%. There is no significant mitral regurgitation identified.  Final Conclusions:  1. Severe bicuspid valve aortic stenosis  2. Severe left circumflex stenosis  3. Mild nonobstructive LAD and right coronary artery stenoses  4. Essentially normal left ventricular systolic function with an estimated left ventricular ejection fraction of 50-55%  5. Essentially normal right heart hemodynamics  Recommendations: [facility] consultation for consideration of aortic valve replacement and bypass of the left circumflex (obtuse marginal branch).  Due to these findings Dr. Sheliah Plane was consulted. He evaluated the patient study and agreed with recommendations to proceed with aortic valve replacement and coronary artery bypass grafting. Current Activity/ Functional Status:  Patient is independent with mobility/ambulation, transfers, ADL's, IADL's.  Past Medical History   Diagnosis  Date   .  Hypertension    .  Hyperlipidemia    .  Depression    .  PVD (peripheral vascular disease)    .  Femoral-popliteal bypass graft occlusion, right  1997     Dr. Hart [place] with vein from rt leg known to be occluded   .  Claudication in peripheral vascular disease right greater left    .  Aortic stenosis  2009     ef 40-45%    Past Surgical History   Procedure  Date   .  Cardiac catheterization   2009     Dr. Eldridge Dace    History   Smoking status   .  Never Smoker   Smokeless tobacco   .  Not on file    History   Alcohol Use  No    History    Social History   .  Marital Status:  Married    Occupational History   .  Works at [employer] History Main Topics   .  Smoking status:  Never Smoker   .  Smokeless tobacco:    .  Alcohol Use:  No   .     .                   Allergies   Allergen  Reactions   .  Codeine  Other (See Comments)     Blood drops    Current Outpatient Prescriptions   Medication  Sig  Dispense  Refill   .  citalopram (CELEXA) 40 MG tablet  Take 40 mg by mouth daily.     .  enalapril (VASOTEC) 5 MG tablet  Take 5 mg by mouth daily.     [name]  aspirin EC 81 MG tablet  Take 81 mg by mouth daily.      Family History: no history of aortic aneurysms or aortic dissections or sudden death. Patient's mother died with diabetes and circulatory problems father died of lung cancer patient's sister died of breast cancer at age 25  ROS  Review of Systems: At time of consultation Cardiac Review of Systems: Y or N  Chest Pain [ n ] Resting SOB [ n ] Exertional SOB [ y ] Orthopnea [place] ]  Pedal Edema [ n ] Palpitations [n ] Syncope [n ] Presyncope [ n ]  General Review of Systems: [Y] = yes [ ] =no  Constitional: recent weight change [n ]; anorexia [ n]; fatigue [ n ]; nausea [ n]; night sweats [ ] ; fever [ ] ; or chills [ ] ;  Dental: poor dentition[n ]; Last Dentist visit: one year ago has consistent dental care  Eye : blurred vision [ ] ; diplopia [ ] ; vision changes [ ] ; Amaurosis fugax[ ] ;  Resp: cough [ ] ; wheezing[ ] ; hemoptysis[ ] ; shortness of breath[ ] ; paroxysmal nocturnal dyspnea[ ] ; dyspnea on exertion[ ] ; or orthopnea[ ] ;  GI: gallstones[ ] , vomiting[ ] ; dysphagia[ ] ; melena[ ] ; hematochezia [ ] ; heartburn[ ] ; Hx of Colonoscopy[ y ]; colonoscopy was done 3 years ago  GU: kidney stones [ ] ; hematuria[ ] ; dysuria [ ] ; nocturia[ ] ; history of  obstruction [ ] ;  Skin: rash, swelling[ ] ;, hair loss[ ] ; peripheral edema[ ] ; or itching[ ] ;  Musculosketetal: myalgias[ ] ; joint swelling[ ] ; joint erythema[ ] ;  joint pain[ ] ; back pain[ ] ;  Heme/Lymph: bruising[ ] ; bleeding[ ] ; anemia[ ] ;  Neuro: TIA[ ] ; headaches[ ] ; stroke[n ]; vertigo[ ] ; seizures[ ] ; paresthesias[ ] ; difficulty walking[ ] ;  Psych:depression[ ] ; anxiety[ ] ;  Endocrine:  diabetes[ ] ; thyroid dysfunction[ ] ;  Immunizations: Flu [ y ]; Pneumococcal[n ];  Other: claudication right leg  Physical Exam: At time of consultation BP 125/79  Pulse 68  Resp 18  Ht 6' (1.829 m)  Wt 217 lb (98.431 kg)  BMI 29.43 kg/m2  SpO2 98%  General appearance: alert, cooperative, appears older than stated age and no distress  Neurologic: intact  Heart: prominent apical impulse, regular rate and rhythm and systolic murmur: holosystolic 3/6, crescendo at 2nd right intercostal space  Lungs: clear to auscultation bilaterally  Abdomen: soft, non-tender; bowel sounds normal; no masses, no organomegaly  Extremities: extremities normal, atraumatic, no cyanosis or edema, no edema, redness or tenderness in the calves or thighs and Patient has no palpable pulses in the right ankle 1+ DP and PT on the left    the patient was deemed medically stable for proceeding with the surgery and on 07/18/2011 and underwent the following procedure:  PREOPERATIVE DIAGNOSIS: Severe aortic stenosis and coronary occlusive  disease.  POSTOPERATIVE DIAGNOSIS: Severe aortic stenosis and coronary occlusive  disease.  SURGICAL PROCEDURE: Aortic valve replacement with a pericardial tissue  valve, 23 mm, [employer], model 3300TFX, serial [id number] and  coronary artery bypass grafting with the left internal mammary to the  circumflex coronary artery.  SURGEON: Sheliah Plane, M.D.  FIRST ASSISTANT: Rowe Clack, P.A.-C.  He tolerated the procedure well and was taken to the surgical intensive care he in  stable condition.  Postoperative hospital course:  Overall the patient has progressed nicely. He was weaned from the ventilator without difficulty. His remained neurologically intact. He has been hemodynamically stable however did develop atrial fibrillation. He has subsequently been chemically cardioverted back to normal sinus rhythm. Additionally he has been started on Coumadin. This is due to the irregular rhythm and the placement of the new valve.  All routine lines, monitors, drainage devices have been discontinued in the standard fashion. He is tolerating gradual increase in activity using standard postoperative protocols. Incisions are healing well without evidence of infection. He does have a moderate fluid volume overload but is responding well to diuretics. He does have an expected acute blood loss anemia which is stable. [name] He also has developed an episode of gout and has been started on colchicine.       Basename 07/24/11 0610  NA 132*  K 3.7  CL 96  CO2 28  GLUCOSE 91  BUN 14  CALCIUM 8.8    Basename 07/24/11 0610  WBC 10.6*  HGB 10.3*  HCT 30.7*  PLT 236    Basename 07/26/11 0620 07/25/11 0535  INR 1.34 1.12     Discharge Instructions:  The patient is discharged to home with extensive instructions on wound care and progressive ambulation.  They are instructed not to drive or perform any heavy lifting until returning to see the physician in his office.  Discharge Diagnosis:  AORTIC STENOSIS Post operative Atrial fibrillation Acute blood loss anemia Gout Secondary Diagnosis: Patient Active Problem List  Diagnoses  . Aortic stenosis  . Coronary artery disease  . Exertional angina  . Peripheral vascular disease  . Hyperlipidemia  . Aortic stenosis, severe   Past Medical History  Diagnosis Date  . Hypertension   . Hyperlipidemia   . Depression   . PVD (peripheral vascular disease)   . Femoral-popliteal bypass graft occlusion, right 1997    Dr. Hart [place]    . Claudication in peripheral vascular disease   . Aortic stenosis 2009  ef 40-45%   . Aortic stenosis   . Aortic valve disorder   . Coronary artery disease   . Aneurysm of ascending aorta   . Shortness of breath   . GERD (gastroesophageal reflux disease)   . Headache   . Arthritis   . PONV (postoperative nausea and vomiting)    Medications at discharge:     1.    amiodarone 400 MG tablet    Commonly known as: PACERONE    Take 1 tablet (400 mg total) by mouth 2 (two) times daily. X 1 week, then 200 mg po bid             2.  cephALEXin 500 MG capsule    Commonly known as: KEFLEX    Take 1 capsule (500 mg total) by mouth every 8 (eight) hours. X 4 more days             3.   colchicine 0.6 MG tablet    Take 1 tablet (0.6 mg total) by mouth 2 (two) times daily.          X 1 week        4.   metoprolol 50 MG tablet    Commonly known as: LOPRESSOR    Take 1 tablet (50 mg total) by mouth 3 (three) times daily.                5.  oxyCODONE 5 MG immediate release tablet    Commonly known as: Oxy IR/ROXICODONE    Take 1-2 tablets (5-10 mg total) by mouth every 4 (four) hours as needed.              6. rosuvastatin 10 MG tablet    Commonly known as: CRESTOR    Take 1 tablet (10 mg total) by mouth daily at 6 PM.               7.  warfarin 5 MG tablet    Commonly known as: COUMADIN    Take 1 tablet (5 mg total) by mouth daily. Or as directed by [facility]                        8.   aspirin 81 MG EC tablet    Take 1 tablet (81 mg total) by mouth daily.             9.    citalopram 40 MG tablet    Commonly known as: CELEXA    Take 40 mg by mouth daily.            10.   enalapril 5 MG tablet    Commonly known as: VASOTEC    Take 5 mg by mouth daily.            11.   lansoprazole 15 MG capsule    Commonly known as: PREVACID    Take 15 mg by mouth daily as needed         Disposition: Discharged home  Patient's condition is Good  Gershon Crane,  PA-C 07/26/2011  11:08 AM

## 2011-07-21 NOTE — Progress Notes (Signed)
CARDIAC REHAB PHASE I   PRE:  Rate/Rhythm: 114 ST    BP: sitting 95/82    SaO2: 92 RA  MODE:  Ambulation: 510 ft   POST:  Rate/Rhythm: 106 ST    BP: sitting 120/73     SaO2: 89-90 RA  Pt HR up sitting in chair. Pt used RW, assist x1. HR actually slightly lower walking, 106 ST. Some SOB, SAO2 borderline. Upon sitting in chair HR down to 76 SR. No c/o. Pt sts RN gave him meds. Ed completed. Will walk more today. [id number]  Harriet Masson CES, ACSM

## 2011-07-21 NOTE — Progress Notes (Addendum)
3 Days Post-Op Procedure(s) (LRB): AORTIC VALVE REPLACEMENT (AVR) (N/A) CORONARY ARTERY BYPASS GRAFTING (CABG) (N/A)  Subjective: Feels SOB with exertion. Walked in halls this morning.  Overall, feels well.  Objective: Vital signs in last 24 hours: Patient Vitals for the past 24 hrs:  BP Temp Temp src Pulse Resp SpO2 Weight  07/21/11 0520 114/67 mmHg 99.2 F (37.3 C) Oral 69  20  96 % -  07/21/11 0455 - - - - - - 225 lb 8.5 oz (102.3 kg)  07/20/11 2034 113/61 mmHg 99.6 F (37.6 C) Oral 76  20  - -  07/20/11 1300 116/82 mmHg 99.1 F (37.3 C) Oral 71  18  94 % -  07/20/11 1132 129/72 mmHg 98.4 F (36.9 C) Oral 70  22  91 % -  07/20/11 1100 108/93 mmHg - - 79  22  94 % -  07/20/11 1000 123/72 mmHg - - 70  21  92 % -  07/20/11 0900 106/58 mmHg - - 65  29  94 % -   Current Weight  07/21/11 225 lb 8.5 oz (102.3 kg)  Pre-op wt= 98 kg   Intake/Output from previous day: 12/20 0701 - 12/21 0700 In: 360 [P.O.:200; I.V.:60; IV Piggyback:100] Out: 825 [Urine:825]    PHYSICAL EXAM:  Heart: RRR Lungs: clear, though sl decreased in bases Wound: clean and dry Extremities: mild LE edema  Lab Results: CBC: Basename 07/20/11 0355 07/19/11 1755  WBC 15.5* 14.1*  HGB 10.4* 10.4*  HCT 31.5* 31.7*  PLT 89* 96*   BMET:  Basename 07/20/11 0355 07/19/11 1755 07/19/11 1754 07/19/11 0425  NA 131* -- 136 --  K 4.1 -- 4.1 --  CL 99 -- 100 --  CO2 27 -- -- 25  GLUCOSE 113* -- 139* --  BUN 17 -- 18 --  CREATININE 1.01 1.11 -- --  CALCIUM 8.0* -- -- 7.9*    PT/INR:  Basename 07/18/11 1400  LABPROT 19.1*  INR 1.57*     Assessment/Plan: S/P Procedure(s) (LRB): AORTIC VALVE REPLACEMENT (AVR) (N/A) CORONARY ARTERY BYPASS GRAFTING (CABG) (N/A) CV- stable.  Continue current meds. Vol overload- diurese. Pulm toilet, CRPI. Home 1-2 days if remains stable.   LOS: 3 days    COLLINS,GINA H 07/21/2011   I have seen and examined [name] and agree with the above  assessment  and plan.  Delight Ovens MD Beeper [phone] Office [phone] 07/21/2011 4:37 PM

## 2011-07-21 NOTE — Progress Notes (Signed)
DC'd CT sutures per MD order per hospital policy. Patient tolerated well, will continue to monitor closely. Applied benzoin and steri strips to CT suture site. Bernita Raisin, RN

## 2011-07-22 LAB — GLUCOSE, CAPILLARY
Glucose-Capillary: 96 mg/dL (ref 70–99)
Glucose-Capillary: 110 mg/dL — ABNORMAL HIGH (ref 70–99)
Glucose-Capillary: 82 mg/dL (ref 70–99)
Glucose-Capillary: 95 mg/dL (ref 70–99)

## 2011-07-22 MED ORDER — METOPROLOL TARTRATE 25 MG PO TABS
37.5000 mg | ORAL_TABLET | Freq: Two times a day (BID) | ORAL | Status: DC
  Administered 2011-07-22 – 2011-07-23 (×4): 37.5 mg via ORAL
  Filled 2011-07-22 (×6): qty 1

## 2011-07-22 NOTE — Progress Notes (Addendum)
[name]            [place] [address]          [phone]     4 Days Post-Op  Procedure(s) (LRB): AORTIC VALVE REPLACEMENT (AVR) (N/A) CORONARY ARTERY BYPASS GRAFTING (CABG) (N/A) Subjective: Feeling stronger. No new complaints  Objective  Telemetry NSR/STACHY occasional PVC'S  Temp:  [99 F (37.2 C)-100 F (37.8 C)] 99 F (37.2 C) (12/22 0457) Pulse Rate:  [72-114] 74  (12/22 0457) Resp:  [16-18] 16  (12/22 0457) BP: (110-119)/(65-82) 113/68 mmHg (12/22 0457) SpO2:  [93 %-100 %] 98 % (12/22 0457) Weight:  [220 lb 10.9 oz (100.1 kg)] 220 lb 10.9 oz (100.1 kg) (12/22 0457)   Intake/Output Summary (Last 24 hours) at 07/22/11 0750 Last data filed at 07/22/11 0616  Gross per 24 hour  Intake   1080 ml  Output    300 ml  Net    780 ml       General appearance: alert, cooperative and no distress Heart: regular rate and rhythm and systolic murmur: flow 2/6, soft throughout the precordium Lungs: diminished in the bases Abdomen: moderate distension, soft, +BS, non tender Extremities: no edema, redness or tenderness in the calves or thighs Wound: incisions healing well without signs of infection  Lab Results:  Basename 07/21/11 0840 07/20/11 0355 07/19/11 1755  NA 130* 131* --  K 3.6 4.1 --  CL 94* 99 --  CO2 30 27 --  GLUCOSE 117* 113* --  BUN 17 17 --  CREATININE 0.96 1.01 --  CALCIUM 8.7 8.0* --  MG -- -- 2.2  PHOS -- -- --   No results found for this basename: AST:2,ALT:2,ALKPHOS:2,BILITOT:2,PROT:2,ALBUMIN:2 in the last 72 hours No results found for this basename: LIPASE:2,AMYLASE:2 in the last 72 hours  Basename 07/21/11 0840 07/20/11 0355  WBC 13.5* 15.5*  NEUTROABS -- --  HGB 10.6* 10.4*  HCT 32.2* 31.5*  MCV 91.7 92.1  PLT 115* 89*   No results found for this basename: CKTOTAL:4,CKMB:4,TROPONINI:4 in the last 72 hours No components found with this basename: POCBNP:3 No results found for this basename: DDIMER  in the last 72 hours No results found for this basename: HGBA1C in the last 72 hours No results found for this basename: CHOL,HDL,LDLCALC,TRIG,CHOLHDL in the last 72 hours No results found for this basename: TSH,T4TOTAL,FREET3,T3FREE,THYROIDAB in the last 72 hours No results found for this basename: VITAMINB12,FOLATE,FERRITIN,TIBC,IRON,RETICCTPCT in the last 72 hours  Medications: Scheduled    . aspirin EC  325 mg Oral Daily  . Chlorhexidine Gluconate Cloth  6 each Topical Q0600  . citalopram  40 mg Oral Daily  . docusate sodium  200 mg Oral Daily  . enalapril  5 mg Oral Daily  . insulin aspart  0-24 Units Subcutaneous TID AC & HS  . metoprolol tartrate  25 mg Oral BID  . mupirocin ointment   Nasal BID  . pantoprazole  40 mg Oral QAC breakfast  . povidone-iodine  1 application Topical BID  . rosuvastatin  10 mg Oral q1800  . sodium chloride  3 mL Intravenous Q12H     Radiology/Studies:  No results found.  INR: Will add last result for INR, ABG once components are confirmed Will add last 4 CBG results once components are confirmed  Assessment/Plan: S/P Procedure(s) (LRB): AORTIC VALVE REPLACEMENT (AVR) (N/A) CORONARY ARTERY BYPASS GRAFTING (CABG) (N/A)  1. Conts to improve. 2. Will Increase Beta blocker dose 3. CBG good control 4. Cont rehab/pulm toilet, watch temp curve with low grade fevers, poss home in am  LOS: 4 days    [name],[name] 12/22/20127:50 AM    Transient A fib to 140 while ambulating. Now back from walk and in ST at 118. Hasn't gotten lopressor yet- will go ahead and give now. Home in AM if fevers resolve and no arrhthymias.

## 2011-07-22 NOTE — Progress Notes (Signed)
CARDIAC REHAB PHASE I   PRE:  Rate/Rhythm:81 sinus rhythm  BP:  Supine:   Sitting: 124/70  Standing:    SaO2: 92% ra  MODE:  Ambulation: 550 ft   POST:  Rate/Rhythem: 125 sinus  BP:  Supine:   Sitting: 110/64  Standing:    SaO2: 97% Pt tolerated hallway walk without difficulty, rolling walker 1 assist.  Several standing rest breaks.   Asymptomatic.  Pt developed rapid afib rate 142 with ambulation.  Dr. Dorris Fetch and RN aware.  Pt asymptomatic.  Education reinforced.  Pt and wife questions answered.  Pt returned to chair call light in reach.  Understanding verbalized  [name]

## 2011-07-22 NOTE — Progress Notes (Signed)
Patient ambulated 200 ft on 1 liter 02.  Patient complained of "chest feels tight".  Patient completed walk and returned to his room.  Patient sats 92/2 lpm via nasal cannula.  Will continue to monitor.

## 2011-07-23 LAB — GLUCOSE, CAPILLARY
Glucose-Capillary: 101 mg/dL — ABNORMAL HIGH (ref 70–99)
Glucose-Capillary: 101 mg/dL — ABNORMAL HIGH (ref 70–99)
Glucose-Capillary: 113 mg/dL — ABNORMAL HIGH (ref 70–99)

## 2011-07-23 MED ORDER — ENOXAPARIN SODIUM 40 MG/0.4ML ~~LOC~~ SOLN
40.0000 mg | SUBCUTANEOUS | Status: DC
  Administered 2011-07-23 – 2011-07-26 (×4): 40 mg via SUBCUTANEOUS
  Filled 2011-07-23 (×4): qty 0.4

## 2011-07-23 MED ORDER — CEPHALEXIN 500 MG PO CAPS
500.0000 mg | ORAL_CAPSULE | Freq: Three times a day (TID) | ORAL | Status: DC
  Administered 2011-07-23 – 2011-07-27 (×12): 500 mg via ORAL
  Filled 2011-07-23 (×15): qty 1

## 2011-07-23 MED ORDER — AMIODARONE HCL 200 MG PO TABS
400.0000 mg | ORAL_TABLET | Freq: Two times a day (BID) | ORAL | Status: DC
  Administered 2011-07-23 – 2011-07-27 (×9): 400 mg via ORAL
  Filled 2011-07-23 (×10): qty 2

## 2011-07-23 NOTE — Progress Notes (Signed)
Family called [place] that patient had a coughing spell. Said patient was trying to drink water and started chocking. Another family member describe it as  seizure like activity. It lasted only a few seconds. No nurse or aide witness anything by the time we got there. Patient appeared stable, in no distress and recount the what happen. " i was trying to drink water and i felt it went the wrong route and had a coughing spell. i have had such spells before." vital signs were stable. See vital signs.

## 2011-07-23 NOTE — Progress Notes (Addendum)
[address]            [place] [address]          [phone]     5 Days Post-Op  Procedure(s) (LRB): AORTIC VALVE REPLACEMENT (AVR) (N/A) CORONARY ARTERY BYPASS GRAFTING (CABG) (N/A) Subjective: Having episodic paroxysms of atrial fibrillation, mostly asymptomatic. Feeling stronger.  Objective  Telemetry PAF/Stachy  Temp:  [98.4 F (36.9 C)-99.4 F (37.4 C)] 98.4 F (36.9 C) (12/23 0420) Pulse Rate:  [78-121] 99  (12/23 0420) Resp:  [16-20] 20  (12/23 0420) BP: (114-130)/(72-89) 127/72 mmHg (12/23 0420) SpO2:  [94 %-98 %] 98 % (12/23 0420) Weight:  [222 lb 12.8 oz (101.061 kg)] 222 lb 12.8 oz (101.061 kg) (12/23 0500)   Intake/Output Summary (Last 24 hours) at 07/23/11 0808 Last data filed at 07/23/11 0600  Gross per 24 hour  Intake   1080 ml  Output   1300 ml  Net   -220 ml       General appearance: alert, cooperative and no distress Heart: regular rate and rhythm and tachy, 2/6 syst flow murmur Lungs: diminished in the bases Abdomen: moderate distension, + BS, no TTP Extremities: no edema, redness or tenderness in the calves or thighs Wound: incisions healing well without signs of infection  Lab Results:  Basename 07/21/11 0840  NA 130*  K 3.6  CL 94*  CO2 30  GLUCOSE 117*  BUN 17  CREATININE 0.96  CALCIUM 8.7  MG --  PHOS --   No results found for this basename: AST:2,ALT:2,ALKPHOS:2,BILITOT:2,PROT:2,ALBUMIN:2 in the last 72 hours No results found for this basename: LIPASE:2,AMYLASE:2 in the last 72 hours  Basename 07/21/11 0840  WBC 13.5*  NEUTROABS --  HGB 10.6*  HCT 32.2*  MCV 91.7  PLT 115*   No results found for this basename: CKTOTAL:4,CKMB:4,TROPONINI:4 in the last 72 hours No components found with this basename: POCBNP:3 No results found for this basename: DDIMER in the last 72 hours No results found for this basename: HGBA1C in the last 72 hours No results found for this basename:  CHOL,HDL,LDLCALC,TRIG,CHOLHDL in the last 72 hours No results found for this basename: TSH,T4TOTAL,FREET3,T3FREE,THYROIDAB in the last 72 hours No results found for this basename: VITAMINB12,FOLATE,FERRITIN,TIBC,IRON,RETICCTPCT in the last 72 hours  Medications: Scheduled    . aspirin EC  325 mg Oral Daily  . Chlorhexidine Gluconate Cloth  6 each Topical Q0600  . citalopram  40 mg Oral Daily  . docusate sodium  200 mg Oral Daily  . enalapril  5 mg Oral Daily  . insulin aspart  0-24 Units Subcutaneous TID AC & HS  . metoprolol tartrate  37.5 mg Oral BID  . mupirocin ointment   Nasal BID  . pantoprazole  40 mg Oral QAC breakfast  . povidone-iodine  1 application Topical BID  . rosuvastatin  10 mg Oral q1800  . sodium chloride  3 mL Intravenous Q12H     Radiology/Studies:  No results found.  INR: Will add last result for INR, ABG once components are confirmed Will add last 4 CBG results once components are confirmed  Assessment/Plan: S/P Procedure(s) (LRB): AORTIC VALVE REPLACEMENT (AVR) (N/A) CORONARY ARTERY BYPASS GRAFTING (CABG) (N/A) 1. Will add po amiodarone, cont ASA, add lovenox 2. Fever curve improving 3. Recheck labs in am 4. pulm toilet/cardiac rehab  LOS: 5 days    GOLD,WAYNE E 12/23/20128:08 AM  Feels well but still with some cough Drainage from lower portion of sternal incision c/w fat necrosis, not purulent, minimal local erythema along length of wound, given he has an AVR will start Keflex to be safe.

## 2011-07-24 LAB — BASIC METABOLIC PANEL
BUN: 14 mg/dL (ref 6–23)
CO2: 28 mEq/L (ref 19–32)
Calcium: 8.8 mg/dL (ref 8.4–10.5)
Chloride: 96 mEq/L (ref 96–112)
Creatinine, Ser: 0.84 mg/dL (ref 0.50–1.35)
GFR calc Af Amer: >90 mL/min (ref >90)
GFR calc non Af Amer: >90 mL/min (ref >90)
Glucose, Bld: 91 mg/dL (ref 70–99)
Potassium: 3.7 mEq/L (ref 3.5–5.1)
Sodium: 132 mEq/L — ABNORMAL LOW (ref 135–145)

## 2011-07-24 LAB — GLUCOSE, CAPILLARY
Glucose-Capillary: 108 mg/dL — ABNORMAL HIGH (ref 70–99)
Glucose-Capillary: 87 mg/dL (ref 70–99)
Glucose-Capillary: 97 mg/dL (ref 70–99)
Glucose-Capillary: 79 mg/dL (ref 70–99)
Glucose-Capillary: 104 mg/dL — ABNORMAL HIGH (ref 70–99)

## 2011-07-24 LAB — CBC
HCT: 30.7 % — ABNORMAL LOW (ref 39.0–52.0)
Hemoglobin: 10.3 g/dL — ABNORMAL LOW (ref 13.0–17.0)
MCH: 30.2 pg (ref 26.0–34.0)
MCHC: 33.6 g/dL (ref 30.0–36.0)
MCV: 90 fL (ref 78.0–100.0)
Platelets: 236 10*3/uL (ref 150–400)
RBC: 3.41 MIL/uL — ABNORMAL LOW (ref 4.22–5.81)
RDW: 13.4 % (ref 11.5–15.5)
WBC: 10.6 10*3/uL — ABNORMAL HIGH (ref 4.0–10.5)

## 2011-07-24 MED ORDER — POTASSIUM CHLORIDE CRYS ER 20 MEQ PO TBCR
40.0000 meq | EXTENDED_RELEASE_TABLET | Freq: Once | ORAL | Status: AC
  Administered 2011-07-24: 40 meq via ORAL
  Filled 2011-07-24: qty 2

## 2011-07-24 MED ORDER — METOPROLOL TARTRATE 50 MG PO TABS
50.0000 mg | ORAL_TABLET | Freq: Two times a day (BID) | ORAL | Status: DC
  Administered 2011-07-24 – 2011-07-26 (×5): 50 mg via ORAL
  Filled 2011-07-24 (×6): qty 1

## 2011-07-24 MED ORDER — WARFARIN SODIUM 5 MG PO TABS
5.0000 mg | ORAL_TABLET | Freq: Once | ORAL | Status: AC
  Administered 2011-07-24: 5 mg via ORAL
  Filled 2011-07-24: qty 1

## 2011-07-24 MED ORDER — ASPIRIN EC 81 MG PO TBEC
81.0000 mg | DELAYED_RELEASE_TABLET | Freq: Every day | ORAL | Status: DC
  Administered 2011-07-24 – 2011-07-27 (×4): 81 mg via ORAL
  Filled 2011-07-24 (×5): qty 1

## 2011-07-24 MED ORDER — WARFARIN VIDEO: Freq: Once | Status: DC

## 2011-07-24 MED ORDER — PATIENT'S GUIDE TO USING COUMADIN BOOK
Freq: Once | Status: AC
  Administered 2011-07-24: 14:00:00
  Filled 2011-07-24: qty 1

## 2011-07-24 NOTE — Progress Notes (Addendum)
6 Days Post-Op Procedure(s) (LRB): AORTIC VALVE REPLACEMENT (AVR) (N/A) CORONARY ARTERY BYPASS GRAFTING (CABG) (N/A)  Subjective: Still having intermittent episodes of AF/ST this am. Currently, sinus tach in 110s on telemetry.  Objective: Vital signs in last 24 hours: Patient Vitals for the past 24 hrs:  BP Temp Temp src Pulse Resp SpO2 Weight  07/24/11 0610 122/76 mmHg 98.6 F (37 C) Oral 107  16  97 % 219 lb 9.3 oz (99.6 kg)  07/23/11 1947 128/88 mmHg 98.9 F (37.2 C) Oral 117  18  99 % -  07/23/11 1438 130/78 mmHg - - 111  18  99 % -  07/23/11 1300 130/78 mmHg 98.2 F (36.8 C) - 92  18  98 % -   Current Weight  07/24/11 219 lb 9.3 oz (99.6 kg)     Intake/Output from previous day: 12/23 0701 - 12/24 0700 In: 1440 [P.O.:1440] Out: 1400 [Urine:1400]    PHYSICAL EXAM:  Heart: RRR, tachy Lungs: clear Wound: lower sternum with serosanguinous drainage, mild erythema Extremities: mild LE edema  Lab Results: CBC: Basename 07/24/11 0610 07/21/11 0840  WBC 10.6* 13.5*  HGB 10.3* 10.6*  HCT 30.7* 32.2*  PLT 236 115*   BMET:  Basename 07/24/11 0610 07/21/11 0840  NA 132* 130*  K 3.7 3.6  CL 96 94*  CO2 28 30  GLUCOSE 91 117*  BUN 14 17  CREATININE 0.84 0.96  CALCIUM 8.8 8.7    PT/INR: No results found for this basename: LABPROT,INR in the last 72 hours   Assessment/Plan: S/P Procedure(s) (LRB): AORTIC VALVE REPLACEMENT (AVR) (N/A) CORONARY ARTERY BYPASS GRAFTING (CABG) (N/A) CV- still having intermittent AF, sinus tach.  On po Amio, Lopressor.  Will increase beta blocker since BPs 120-130s, continue to monitor. Sternal drainage- D#2 po Keflex.  No fever.  WBC trending down. CRPI, pulm toilet.  Home when rhythm issues settled.   LOS: 6 days    COLLINS,GINA H 07/24/2011    Chart reviewed, patient examined, agree with above. He looks great overall but still having episodes of A-fib with RVR as noted above.  Lopressor increased.  Will replete K+. I think  it it best to start coumadin with a new aortic valve and recurrent A-fib.  Discussed with pt.

## 2011-07-24 NOTE — Progress Notes (Signed)
Pt ambulated 550 ft on the unit using rolling walker.  Tolerated the activity with no complaints.

## 2011-07-24 NOTE — Progress Notes (Signed)
Patient ambulated X2 (350 ft ) each on room air.  02 sats 92-95% on room air. Patient tolerated well. Will continue to monitor.

## 2011-07-24 NOTE — Progress Notes (Signed)
Pt ambulated 550 ft again around the unit with rolling walker.  Wife at his side.  Tolerated walk with no complaints

## 2011-07-25 LAB — GLUCOSE, CAPILLARY
Glucose-Capillary: 81 mg/dL (ref 70–99)
Glucose-Capillary: 96 mg/dL (ref 70–99)
Glucose-Capillary: 82 mg/dL (ref 70–99)
Glucose-Capillary: 94 mg/dL (ref 70–99)

## 2011-07-25 LAB — PROTIME-INR
INR: 1.12 (ref 0.00–1.49)
Prothrombin Time: 14.6 seconds (ref 11.6–15.2)

## 2011-07-25 MED ORDER — WARFARIN SODIUM 5 MG PO TABS
5.0000 mg | ORAL_TABLET | Freq: Once | ORAL | Status: AC
  Administered 2011-07-25: 5 mg via ORAL
  Filled 2011-07-25: qty 1

## 2011-07-25 NOTE — Progress Notes (Addendum)
7 Days Post-Op Procedure(s) (LRB): AORTIC VALVE REPLACEMENT (AVR) (N/A) CORONARY ARTERY BYPASS GRAFTING (CABG) (N/A)  Subjective: Feels well.  No new issues.  Objective: Vital signs in last 24 hours: Patient Vitals for the past 24 hrs:  BP Temp Temp src Pulse Resp SpO2 Weight  07/25/11 0607 102/66 mmHg 98.7 F (37.1 C) Oral 81  19  95 % -  07/25/11 0543 - - - - - - 217 lb 6 oz (98.6 kg)  07/24/11 2120 129/82 mmHg 98.8 F (37.1 C) Oral 115  19  100 % -  07/24/11 1343 113/79 mmHg 98.8 F (37.1 C) Oral 109  20  94 % -   Current Weight  07/25/11 217 lb 6 oz (98.6 kg)     Intake/Output from previous day:      PHYSICAL EXAM:  Heart: RRR, tachy, 100s Lungs: clear Wound: small amount serosanguinous drainage from lower sternum,  No erythema Extremities: trace LE edema  Lab Results: CBC: Basename 07/24/11 0610  WBC 10.6*  HGB 10.3*  HCT 30.7*  PLT 236   BMET:  Basename 07/24/11 0610  NA 132*  K 3.7  CL 96  CO2 28  GLUCOSE 91  BUN 14  CREATININE 0.84  CALCIUM 8.8    PT/INR:  Basename 07/25/11 0535  LABPROT 14.6  INR 1.12     Assessment/Plan: S/P Procedure(s) (LRB): AORTIC VALVE REPLACEMENT (AVR) (N/A) CORONARY ARTERY BYPASS GRAFTING (CABG) (N/A) CV- No more AF, currently sinus tach, 110s.  On Amio, Coumadin started, beta blocker increased. Continue to monitor.  May need to titrate Lopressor further as BP allows. Continue Keflex D#3 for sternal wound. Possibly home 1-2 days if INR trending up and rhythm stable.   LOS: 7 days    COLLINS,GINA H 07/25/2011   I have seen and examined the patient and agree with the assessment and plan as outlined.  OWEN,CLARENCE H 07/25/2011 11:30 AM

## 2011-07-25 NOTE — Progress Notes (Signed)
Pt has ambulated x2 today with RW on RA, without nurse.  Distance unknown.  No complaints, VSS.

## 2011-07-26 LAB — GLUCOSE, CAPILLARY
Glucose-Capillary: 112 mg/dL — ABNORMAL HIGH (ref 70–99)
Glucose-Capillary: 103 mg/dL — ABNORMAL HIGH (ref 70–99)
Glucose-Capillary: 124 mg/dL — ABNORMAL HIGH (ref 70–99)

## 2011-07-26 LAB — PROTIME-INR
INR: 1.34 (ref 0.00–1.49)
Prothrombin Time: 16.8 seconds — ABNORMAL HIGH (ref 11.6–15.2)

## 2011-07-26 MED ORDER — COLCHICINE 0.6 MG PO TABS
1.2000 mg | ORAL_TABLET | Freq: Once | ORAL | Status: AC
  Administered 2011-07-26: 1.2 mg via ORAL
  Filled 2011-07-26: qty 2

## 2011-07-26 MED ORDER — ASPIRIN 81 MG PO TBEC: 81.0000 mg | DELAYED_RELEASE_TABLET | Freq: Every day | ORAL | Status: AC

## 2011-07-26 MED ORDER — CEPHALEXIN 500 MG PO CAPS: 500.0000 mg | ORAL_CAPSULE | Freq: Three times a day (TID) | ORAL | Status: AC

## 2011-07-26 MED ORDER — WARFARIN SODIUM 5 MG PO TABS
5.0000 mg | ORAL_TABLET | Freq: Once | ORAL | Status: AC
  Administered 2011-07-26: 5 mg via ORAL
  Filled 2011-07-26: qty 1

## 2011-07-26 MED ORDER — METOPROLOL TARTRATE 50 MG PO TABS
50.0000 mg | ORAL_TABLET | Freq: Three times a day (TID) | ORAL | Status: DC
  Administered 2011-07-26 – 2011-07-27 (×3): 50 mg via ORAL
  Filled 2011-07-26 (×6): qty 1

## 2011-07-26 MED ORDER — WARFARIN SODIUM 5 MG PO TABS: 5.0000 mg | ORAL_TABLET | Freq: Every day | ORAL | Status: DC

## 2011-07-26 MED ORDER — COLCHICINE 0.6 MG PO TABS: 0.6000 mg | ORAL_TABLET | Freq: Two times a day (BID) | ORAL | Status: DC

## 2011-07-26 MED ORDER — METOPROLOL TARTRATE 50 MG PO TABS: 50.0000 mg | ORAL_TABLET | Freq: Three times a day (TID) | ORAL | Status: DC

## 2011-07-26 MED ORDER — METOPROLOL TARTRATE 25 MG PO TABS: 50.0000 mg | ORAL_TABLET | Freq: Two times a day (BID) | ORAL | Status: DC

## 2011-07-26 MED ORDER — COLCHICINE 0.6 MG PO TABS
0.6000 mg | ORAL_TABLET | Freq: Two times a day (BID) | ORAL | Status: DC
  Administered 2011-07-27: 0.6 mg via ORAL
  Filled 2011-07-26 (×2): qty 1

## 2011-07-26 MED ORDER — COLCHICINE 0.6 MG PO TABS
0.6000 mg | ORAL_TABLET | Freq: Once | ORAL | Status: AC
  Administered 2011-07-26: 0.6 mg via ORAL
  Filled 2011-07-26: qty 1

## 2011-07-26 MED ORDER — AMIODARONE HCL 400 MG PO TABS: 400.0000 mg | ORAL_TABLET | Freq: Two times a day (BID) | ORAL | Status: DC

## 2011-07-26 MED ORDER — METOPROLOL TARTRATE 50 MG PO TABS: 50.0000 mg | ORAL_TABLET | Freq: Two times a day (BID) | ORAL | Status: DC

## 2011-07-26 NOTE — Progress Notes (Addendum)
CARDIAC REHAB PHASE I   PRE:  Rate/Rhythm: 108 ST  BP:  Supine:   Sitting: 123/79  Standing:    SaO2: 98% RA  MODE:  Ambulation: 550 ft   POST:  Rate/Rhythem: 116 ST  BP:  Supine:   Sitting: 129/76  Standing:    SaO2: 97% RA   0454-0981 Pt tolerated ambulation well with assist x1, gait steady, no RW needed. Mild SOB, standing rest x1, no other c/o. To chair after walk, VSS. Reinforced education and exercise guidelines.  Annetta Maw

## 2011-07-26 NOTE — Progress Notes (Signed)
Pt ambulated hall with wife independently.  No complaints.  HR stable in low 100's.

## 2011-07-26 NOTE — Progress Notes (Signed)
   CARE MANAGEMENT NOTE 07/26/2011  Patient:  [name], [name]   Account Number:  [id number]  Date Initiated:  07/19/2011  Documentation initiated by:  [facility]  Subjective/Objective Assessment:   Admitted to ICU post OP CABG x1 andAVR     Action/Plan:   PTA, PT INDEPENDENT, LIVES WITH SPOUSE.   Anticipated DC Date:  07/26/2011   Anticipated DC Plan:  HOME W HOME HEALTH SERVICES      DC Planning Services  CM consult      Choice offered to / List presented to:             Status of service:  In process, will continue to follow Medicare Important Message given?   (If response is "NO", the following Medicare IM given date fields will be blank) Date Medicare IM given:   Date Additional Medicare IM given:    Discharge Disposition:    Per UR Regulation:  Reviewed for med. necessity/level of care/duration of stay  Comments:  07/26/11 Sindi Beckworth,RN,BSN 1230 PT'S PROGRESSION COMPLICATED BY POST OP AFIB.  WIFE TO PROVIDE 24HR CARE AT DISCHARGE.  WILL FOLLOW FOR HOME NEEDS. Phone #[phone]   07-24-11 10:40am Avie Arenas,  RNBSN [phone] UR completed.  07-19-11 11:30am Avie Arenas, RNBSN [phone] UR completed.

## 2011-07-26 NOTE — Progress Notes (Addendum)
[name]            [place] [address]          [phone]     8 Days Post-Op  Procedure(s) (LRB): AORTIC VALVE REPLACEMENT (AVR) (N/A) CORONARY ARTERY BYPASS GRAFTING (CABG) (N/A) Subjective: Remains tachy approx. 110, feels well  Objective  Telemetry stachy, short runs of afib  Temp:  [98 F (36.7 C)-98.9 F (37.2 C)] 98.7 F (37.1 C) (12/26 0602) Pulse Rate:  [87-112] 107  (12/26 0602) Resp:  [18-20] 18  (12/26 0602) BP: (106-125)/(75-82) 119/82 mmHg (12/26 0602) SpO2:  [96 %-98 %] 96 % (12/26 0602) Weight:  [215 lb 12.8 oz (97.886 kg)] 215 lb 12.8 oz (97.886 kg) (12/26 0602)   Intake/Output Summary (Last 24 hours) at 07/26/11 0821 Last data filed at 07/25/11 0900  Gross per 24 hour  Intake      0 ml  Output      1 ml  Net     -1 ml       General appearance: alert and no distress Heart: RRR, 1/6 syst flow murmur, tachy Lungs: mildly diinished in bases Abdomen: soft, non-tender; bowel sounds normal; no masses,  no organomegaly Extremities: minor edema Wound: scant amt of drainage from lower pole of sternal incision, srenum stable  Lab Results:  [facility] 07/24/11 0610  NA 132*  K 3.7  CL 96  CO2 28  GLUCOSE 91  BUN 14  CREATININE 0.84  CALCIUM 8.8  MG --  PHOS --   No results found for this basename: AST:2,ALT:2,ALKPHOS:2,BILITOT:2,PROT:2,ALBUMIN:2 in the last 72 hours No results found for this basename: LIPASE:2,AMYLASE:2 in the last 72 hours  Basename 07/24/11 0610  WBC 10.6*  NEUTROABS --  HGB 10.3*  HCT 30.7*  MCV 90.0  PLT 236   No results found for this basename: CKTOTAL:4,CKMB:4,TROPONINI:4 in the last 72 hours No components found with this basename: POCBNP:3 No results found for this basename: DDIMER in the last 72 hours No results found for this basename: HGBA1C in the last 72 hours No results found for this basename: CHOL,HDL,LDLCALC,TRIG,CHOLHDL in the last 72 hours No results found for this  basename: TSH,T4TOTAL,FREET3,T3FREE,THYROIDAB in the last 72 hours No results found for this basename: VITAMINB12,FOLATE,FERRITIN,TIBC,IRON,RETICCTPCT in the last 72 hours  Medications: Scheduled    . amiodarone  400 mg Oral BID  . aspirin EC  81 mg Oral Daily  . cephALEXin  500 mg Oral Q8H  . citalopram  40 mg Oral Daily  . docusate sodium  200 mg Oral Daily  . enalapril  5 mg Oral Daily  . enoxaparin  40 mg Subcutaneous Q24H  . insulin aspart  0-24 Units Subcutaneous TID AC & HS  . metoprolol tartrate  50 mg Oral BID  . pantoprazole  40 mg Oral QAC breakfast  . povidone-iodine  1 application Topical BID  . rosuvastatin  10 mg Oral q1800  . sodium chloride  3 mL Intravenous Q12H  . warfarin  5 mg Oral ONCE-1800  . warfarin   Does not apply Once     Radiology/Studies:  No results found.  INR:1.34 Will add last result for INR, ABG once components are confirmed Will add last 4 CBG results once components are confirmed  Assessment/Plan: S/P Procedure(s) (LRB): AORTIC VALVE REPLACEMENT (AVR) (N/A) CORONARY ARTERY BYPASS GRAFTING (CABG) (N/A) 1. Rhythm improved but not fully stabilized, ?  Increase B Blocker or poss add calcium channel blocker 2. Cont AC RX , will need to determine timing of d/c epw's as we are still adjusting chronotropic meds.  3. Observe sternal drainage, and cont keflex  LOS: 8 days    [name],[name] 12/26/20128:21 AM     Chart reviewed, patient examined, agree with above.  He is doing well overall.  His HR is still 108 at rest this am.  Will change lopressor to 50 every 8 hrs.  I suspect his HR will come down over the next few weeks.  He is complaining of gout in the right foot which he has had before.  He uses colchicine periodically at home for this so will start it now.  Probably home in am.

## 2011-07-26 NOTE — Progress Notes (Signed)
Called to pt room by family member- stating "he is choking " pt observed sitting up in recliner, fist clenched,  - when asked could he speak pt responded "uh- uh"-  This episode  lasting for only a few seconds- 131/87 hr 100  , o2 sat 100 - pt wife at bedside stated this has happened before after eating ice- will notify md. Georgette Dover

## 2011-07-27 LAB — GLUCOSE, CAPILLARY
Glucose-Capillary: 115 mg/dL — ABNORMAL HIGH (ref 70–99)
Glucose-Capillary: 100 mg/dL — ABNORMAL HIGH (ref 70–99)

## 2011-07-27 LAB — PROTIME-INR
INR: 1.63 — ABNORMAL HIGH (ref 0.00–1.49)
Prothrombin Time: 19.6 seconds — ABNORMAL HIGH (ref 11.6–15.2)

## 2011-07-27 NOTE — Progress Notes (Signed)
Patient walked 3rd time yesterday.

## 2011-07-27 NOTE — Progress Notes (Signed)
CARDIAC REHAB PHASE I   PRE:  Rate/Rhythm: 65 SR  BP:  Supine:   Sitting: 114/68  Standing:    SaO2: 100% RA  MODE:  Ambulation: 350 ft   POST:  Rate/Rhythem: 70 SR  BP:  Supine:   Sitting: 117/70  Standing:    SaO2: 93% RA  1016-1040 Pt ambulated 350 ft with assist x1. Decreased distance this walk due to c/o left sided pain below breast that radiates to his back and down. Pt rates the pain a 3 out of 10 on the pain scale and states he feels it mainly when he takes a breath. Pt also c/o right toe soreness from gout. To chair after ambulation with legs elevated, VSS. Notified pt's RN about his pain symptoms.  Annetta Maw

## 2011-07-27 NOTE — Progress Notes (Signed)
EPWs DC'd per order and unit protocol.  All tips intact.  Sites cleaned with betadine.  Pt resting comfortable with call bell and urinal in reach.  BP 116/73.  Will check q 15 mins x4.  Pt verbalized understanding bedrest until 0940.  HOB at 30 degrees.  MT notified of procedure.  Will monitor closely.

## 2011-07-27 NOTE — Progress Notes (Addendum)
[address]            [place] [address]          [phone]     9 Days Post-Op  Procedure(s) (LRB): AORTIC VALVE REPLACEMENT (AVR) (N/A) CORONARY ARTERY BYPASS GRAFTING (CABG) (N/A) Subjective: Feels well   Objective  TelemetrySR/Stach, improved, HR currently in 70's  Temp:  [98.3 F (36.8 C)-98.5 F (36.9 C)] 98.3 F (36.8 C) (12/27 0533) Pulse Rate:  [69-111] 69  (12/27 0533) Resp:  [16-20] 16  (12/27 0533) BP: (113-121)/(65-87) 117/65 mmHg (12/27 0533) SpO2:  [97 %-100 %] 97 % (12/27 0533) Weight:  [212 lb 1.6 oz (96.208 kg)] 212 lb 1.6 oz (96.208 kg) (12/27 8295)   Intake/Output Summary (Last 24 hours) at 07/27/11 0731 Last data filed at 07/27/11 0538  Gross per 24 hour  Intake    963 ml  Output   1000 ml  Net    -37 ml       General appearance: alert, cooperative and no distress Heart: regular rate and rhythm and S1, S2 normal Lungs: clear to auscultation bilaterally Abdomen: minor distension, soft, non tender, + BS Extremities: trace edema Wound: incisions healing well without signs of infection  Lab Results: No results found for this basename: NA:2,K:2,CL:2,CO2:2,GLUCOSE:2,BUN:2,CREATININE:2,CALCIUM:2,MG:2,PHOS:2 in the last 72 hours No results found for this basename: AST:2,ALT:2,ALKPHOS:2,BILITOT:2,PROT:2,ALBUMIN:2 in the last 72 hours No results found for this basename: LIPASE:2,AMYLASE:2 in the last 72 hours No results found for this basename: WBC:2,NEUTROABS:2,HGB:2,HCT:2,MCV:2,PLT:2 in the last 72 hours No results found for this basename: CKTOTAL:4,CKMB:4,TROPONINI:4 in the last 72 hours No components found with this basename: POCBNP:3 No results found for this basename: DDIMER in the last 72 hours No results found for this basename: HGBA1C in the last 72 hours No results found for this basename: CHOL,HDL,LDLCALC,TRIG,CHOLHDL in the last 72 hours No results found for this basename:  TSH,T4TOTAL,FREET3,T3FREE,THYROIDAB in the last 72 hours No results found for this basename: VITAMINB12,FOLATE,FERRITIN,TIBC,IRON,RETICCTPCT in the last 72 hours  Medications: Scheduled    . amiodarone  400 mg Oral BID  . aspirin EC  81 mg Oral Daily  . cephALEXin  500 mg Oral Q8H  . citalopram  40 mg Oral Daily  . colchicine  0.6 mg Oral Once  . colchicine  0.6 mg Oral BID  . colchicine  1.2 mg Oral Once  . docusate sodium  200 mg Oral Daily  . enalapril  5 mg Oral Daily  . insulin aspart  0-24 Units Subcutaneous TID AC & HS  . metoprolol tartrate  50 mg Oral TID  . pantoprazole  40 mg Oral QAC breakfast  . povidone-iodine  1 application Topical BID  . rosuvastatin  10 mg Oral q1800  . sodium chloride  3 mL Intravenous Q12H  . warfarin  5 mg Oral ONCE-1800  . warfarin   Does not apply Once  . DISCONTD: colchicine  0.6 mg Oral BID  . DISCONTD: enoxaparin  40 mg Subcutaneous Q24H  . DISCONTD: metoprolol tartrate  50 mg Oral BID     Radiology/Studies:  No results found.  INR:1.63  Will add last result for INR, ABG once components are confirmed Will add last 4 CBG results once components are confirmed  Assessment/Plan: S/P Procedure(s) (LRB): AORTIC VALVE REPLACEMENT (AVR) (N/A) CORONARY ARTERY BYPASS GRAFTING (CABG) (N/A) Doing well DC epw's this am  D/C home today  LOS: 9  days    GOLD,WAYNE E 12/27/20127:31 AM     Chart reviewed, patient examined, agree with above. His heart rate is fairly well controlled on current regimen.  HR decreases in 65-70 range at night and increases in the morning. He is ok for discharge today. Continue coumadin

## 2011-07-27 NOTE — Progress Notes (Signed)
9562- Cardiac Rehab Phase I- Pt walked independently at 0700. Will f/u later for ambulation.

## 2011-07-28 ENCOUNTER — Telehealth: Payer: Self-pay

## 2011-07-28 ENCOUNTER — Ambulatory Visit (INDEPENDENT_AMBULATORY_CARE_PROVIDER_SITE_OTHER): Payer: 59 | Admitting: *Deleted

## 2011-07-28 DIAGNOSIS — I4891 Unspecified atrial fibrillation: Secondary | ICD-10-CM

## 2011-07-28 DIAGNOSIS — I480 Paroxysmal atrial fibrillation: Secondary | ICD-10-CM | POA: Insufficient documentation

## 2011-07-28 DIAGNOSIS — G8918 Other acute postprocedural pain: Secondary | ICD-10-CM

## 2011-07-28 DIAGNOSIS — Z954 Presence of other heart-valve replacement: Secondary | ICD-10-CM

## 2011-07-28 DIAGNOSIS — I359 Nonrheumatic aortic valve disorder, unspecified: Secondary | ICD-10-CM | POA: Insufficient documentation

## 2011-07-28 DIAGNOSIS — Z952 Presence of prosthetic heart valve: Secondary | ICD-10-CM

## 2011-07-28 DIAGNOSIS — Z7901 Long term (current) use of anticoagulants: Secondary | ICD-10-CM

## 2011-07-28 LAB — POCT INR: INR: 2.9

## 2011-07-28 MED ORDER — TRAMADOL HCL 50 MG PO TABS: 50.0000 mg | ORAL_TABLET | Freq: Four times a day (QID) | ORAL | Status: DC | PRN

## 2011-07-28 NOTE — Telephone Encounter (Signed)
Dr Laneta Simmers ok' ed Tramadol for pain and instructed pt to d/c colchicine for the gout. Pt's wife was notified and Rx sent to [facility] on [address].

## 2011-07-28 NOTE — Telephone Encounter (Signed)
RX called into pharm 07/28/11 @ 930am

## 2011-08-02 ENCOUNTER — Ambulatory Visit (INDEPENDENT_AMBULATORY_CARE_PROVIDER_SITE_OTHER): Payer: 59 | Admitting: *Deleted

## 2011-08-02 DIAGNOSIS — I4891 Unspecified atrial fibrillation: Secondary | ICD-10-CM

## 2011-08-02 DIAGNOSIS — Z954 Presence of other heart-valve replacement: Secondary | ICD-10-CM

## 2011-08-02 DIAGNOSIS — I359 Nonrheumatic aortic valve disorder, unspecified: Secondary | ICD-10-CM

## 2011-08-02 DIAGNOSIS — Z7901 Long term (current) use of anticoagulants: Secondary | ICD-10-CM

## 2011-08-02 LAB — POCT INR: INR: 3.1

## 2011-08-08 ENCOUNTER — Ambulatory Visit
Admission: RE | Admit: 2011-08-08 | Discharge: 2011-08-08 | Disposition: A | Discharge status: Home / Self Care | Payer: 59 | Source: Ambulatory Visit | Attending: Cardiothoracic Surgery | Admitting: Cardiothoracic Surgery

## 2011-08-08 ENCOUNTER — Encounter: Payer: Self-pay | Admitting: Cardiothoracic Surgery

## 2011-08-08 ENCOUNTER — Ambulatory Visit (INDEPENDENT_AMBULATORY_CARE_PROVIDER_SITE_OTHER): Payer: Self-pay | Admitting: Cardiothoracic Surgery

## 2011-08-08 ENCOUNTER — Other Ambulatory Visit: Payer: Self-pay | Admitting: Cardiothoracic Surgery

## 2011-08-08 VITALS — BP 111/74 | HR 74 | Resp 18 | Ht 72.0 in | Wt 193.0 lb

## 2011-08-08 DIAGNOSIS — I350 Nonrheumatic aortic (valve) stenosis: Secondary | ICD-10-CM

## 2011-08-08 DIAGNOSIS — I2510 Atherosclerotic heart disease of native coronary artery without angina pectoris: Secondary | ICD-10-CM

## 2011-08-08 DIAGNOSIS — Z952 Presence of prosthetic heart valve: Secondary | ICD-10-CM

## 2011-08-08 DIAGNOSIS — Z951 Presence of aortocoronary bypass graft: Secondary | ICD-10-CM

## 2011-08-08 DIAGNOSIS — I359 Nonrheumatic aortic valve disorder, unspecified: Secondary | ICD-10-CM

## 2011-08-08 DIAGNOSIS — Z954 Presence of other heart-valve replacement: Secondary | ICD-10-CM

## 2011-08-08 NOTE — Patient Instructions (Addendum)
Endocarditis Information  You may be at risk for developing endocarditis since you have  an artificial heart valve  or a repaired heart valve. Endocarditis is an infection of the lining of the heart or heart valves.   Certain surgical and dental procedures may put you at risk,  such as teeth cleaning or other dental procedures or any surgery involving the respiratory, urinary, gastrointestinal tract, gallbladder or prostate.   Notify your doctor or dentist before having any invasive procedures. You will need to take antibiotics before certain procedures.   To prevent endocarditis, maintain good oral health. Seek prompt medical attention for any mouth/gum, skin or urinary tract infections.      Aortic Valve Replacement Care After Read the instructions outlined below and refer to this sheet for the next few weeks. These discharge instructions provide you with general information on caring for yourself after you leave the hospital. Your surgeon may also give you specific instructions. While your treatment has been planned according to the most current medical practices available, unavoidable complications occasionally occur. If you have any problems or questions after discharge, please call your surgeon. AFTER THE PROCEDURE  Full recovery from heart valve surgery can take several months.   Blood thinning (anticoagulation) treatment with warfarin is often prescribed for 6 weeks to 3 months after surgery for those with biological valves. It is prescribed for life for those with mechanical valves.   Recovery includes healing of the surgical incision. There is a gradual building of stamina and exercise abilities. An exercise program under the direction of a physical therapist may be recommended.   Once you have an artificial valve, your heart function and your life will return to normal. You usually feel better after surgery. Shortness of breath and fatigue should lessen. If your heart was already  severely damaged before your surgery, you may continue to have problems.   You can usually resume most of your normal activities. You will have to continue to monitor your condition. You need to watch out for blood clots and infections.   Artificial valves need to be replaced after a period of time. It is important that you see your caregiver regularly.   Some individuals with an aortic valve replacement need to take antibiotics before having dental work or other surgical procedures. This is called prophylactic antibiotic treatment. These drugs help to prevent infective endocarditis. Antibiotics are only recommended for individuals with the highest risk for developing infective endocarditis. Let your dentist and your caregiver know if you have a history of any of the following so that the necessary precautions can be taken:   A VSD.   A repaired VSD.   Endocarditis in the past.   An artificial (prosthetic) heart valve.  HOME CARE INSTRUCTIONS   Use all medications as prescribed.   Take your temperature every morning for the first week after surgery. Record these.   Weigh yourself every morning for at least the first week after surgery and record.   Do not lift more than 10 pounds (4.5 kg) until your breastbone (sternum) has healed. Avoid all activities which would place strain on your incision.   You may shower as soon as directed by your caregiver after surgery. Pat incisions dry. Do not rub incisions with washcloth or towel.   Avoid driving for 4 to 6 weeks following surgery or as instructed.   Use your elastic stockings during the day. You should wear the stockings for at least 2 weeks after discharge or longer  if your ankles are swollen. The stockings help blood flow and help reduce swelling in the legs. It is easiest to put the stockings on before you get out of bed in the morning. They should fit snugly.  Pain Control  If a prescription was given for a pain reliever, please  follow your doctor's directions.   If the pain is not relieved by your medicine, becomes worse, or you have difficulty breathing, call your surgeon.  Activity  Take frequent rest periods throughout the day.   Wait one week before returning to strenuous activities such as heavy lifting (more than 10 pounds), pushing or pulling.   Talk with your doctor about when you may return to work and your exercise routine.   Do not drive while taking prescription pain medication.  Nutrition  You may resume your normal diet.   Drink plenty of fluids (6-8 glasses a day).   Eat a well-balanced diet.   Call your caregiver for persistent nausea or vomiting.  Elimination Your normal bowel function should return. If constipation should occur, you may:  Take a mild laxative.   Add fruit and bran to your diet.   Drink more fluids.   Call your doctor if constipation is not relieved.  SEEK IMMEDIATE MEDICAL CARE IF:   You develop chest pain which is not coming from your surgical cut (incision).   You develop shortness of breath or have difficulty breathing.   You develop a temperature over 101 F (38.3 C).   You have a sudden weight gain. Let your caregiver know what the weight gain is.   You develop a rash.   You develop any reaction or side effects to medications given.   You have increased bleeding from wounds.   You see redness, swelling, or have increasing pain in wounds.   You have pus coming from your wound.   You develop lightheadedness or feel faint.  Document Released: 02/02/2005 Document Revised: 03/29/2011 Document Reviewed: 04/26/2005 [facility] Patient Information 2012 [place], [place].  Coronary Artery Bypass Grafting Coronary artery bypass grafting (CABG) is done to bypass or fix arteries of the heart (coronary) that have become narrow or blocked. This is usually the result of plaque built up in the walls of the vessels. The coronary arteries supply the heart with the  oxygen and nutrients it needs to pump blood to your body. The heart never rests and needs constant blood flow. If an artery is partially blocked, you may have chest pain (angina). Lack of blood flow to part of the heart muscle may cause that part to die. This is what happens in a heart attack (myocardial infarction). Reasons for CABG include:  Arteries that cannot be treated with medications or other interventions (such as a heart stent).   Severe angina not responsive to other treatment.   Improving heart function.   Treating a heart attack.  Every person is unique. Be sure you understand the risks and benefits of CABG.  RISKS AND COMPLICATIONS Your surgeon will discuss these with you. Your risks will be different depending on your past and present health and other factors. It may be helpful to have a family member or advocate with you so you feel free to ask questions and get the answers you need to give an informed consent. Possible problems of CABG surgery include:  Blood loss and replacement.   Stroke.   Infection.   Surgical site pain.   Heart attack during or after.   Kidney failure.  BEFORE THE PROCEDURE  Tell your doctor about any allergies, medicines, bleeding problems and other surgeries.   Take all medicines exactly as directed. You may start new medicines and stop taking others. Do not stop medications or adjust dosages on your own. Continue taking the medications up until the time of surgery.   Perform only activities that are suggested by your caregiver.   If you are overweight, you should try to lose weight. Eat a heart-healthy diet that is low in fat and salt.   If you smoke, quit.   Let your caregiver know if you have been on steroids (including creams or drops) for long periods of time. This is critical.   If you are diabetic discuss with your doctor whether or not you should take insulin the day of your surgery.  DAY OF SURGERY:  Plan to arrive 60  minutes before the scheduled time or as directed.   Do not eat or drink anything, including medicine, unless directed.   You will sign a written consent. You may have blood work and other tests done.   Your family will be shown where they can wait for the surgeon to talk to them when the surgery is over.  PROCEDURE Only a specially trained surgeon does a CABG assisted by a team of other health care professionals. You will be asleep and not feel any discomfort during the surgery.   Traditional method:   A cut (incision) is made down the front of the chest through the breastbone (sternum).   The sternum is spread open so the surgeon can see your heart.   You are connected to a machine that does the work of your heart and lungs and your heart stops beating.   Veins are taken from your leg(s) and used to bypass the blocked arteries of your heart. Sometimes an artery from inside your chest wall is used, either by itself or along with leg veins.   When the bypasses are done, you are taken off the machine, and your heart is restarted and takes over again.   Alternate methods:   The heart lung machine is not used. This is called off-pump. Your heart continues to beat while the bypasses are done.   Smaller incisions are used instead of going through the middle of the chest (minimally invasive).   Intended to reduce pain and promote a faster recovery.  AFTER THE PROCEDURE  You may wake up with a tube in your throat to help your breathing. You may be connected to a breathing machine.   You will not be able to talk while the tube is in place. Try not to fight against it. The tube will be taken out as soon as it is safe.   Even if you cannot see them, there are nurses nearby who are watching everything. You are not alone.   Your family should be prepared to see you with many tubes and wires. You will be sleepy and pale. Your family can hold your hand and speak to you, but you may have no  memory of this time.  SEEK IMMEDIATE MEDICAL CARE IF:  You have severe chest pain, especially if the pain is crushing or pressure-like and spreads to the arms, back, neck, or jaw, or if you have sweating, feel sick to your stomach (nausea), or shortness of breath. THIS IS AN EMERGENCY. Do not wait to see if the pain will go away. Get medical help at once. Call your local emergency services (  911 in [place] DO NOT drive yourself to the hospital.   You have an attack of chest pain lasting longer than usual, despite rest and treatment with the medications your doctor has prescribed.   You wake from sleep with chest pain.   You feel dizzy or faint.  Document Released: 04/26/2005 Document Revised: 03/29/2011 Document Reviewed: 01/01/2008 [facility] Patient Information 2012 [place], [place].

## 2011-08-08 NOTE — Progress Notes (Signed)
[address]            [place] [address]          [phone]       IVERY NANNEY [facility] Medical Record #[id number] Date of Birth: 03-Mar-1951  Micheline Chapman, MD Marye Round, MD  Chief Complaint:   PostOp Follow Up Visit SURGICAL PROCEDURE: Aortic valve replacement with a pericardial tissue  valve, 23 mm, [employer], model 3300TFX, serial [phone] and  coronary artery bypass grafting with the left internal mammary to the  circumflex coronary artery.   History of Present Illness:      Patient returns for followup visit after coronary artery bypass grafting and an aortic valve replacement done 3 weeks ago. The patient had some atrial fibrillation postoperatively in the setting of a pericardial tissue valve so was discharged home on Coumadin. He's been making good progress at home without evidence of congestive heart failure or angina. He's had no fever chills. He's been increasing his physical activity appropriately. He's not aware of any recurrent atrial fibrillation    History  Smoking status  . Never Smoker   Smokeless tobacco  . Not on file       Allergies  Allergen Reactions  . Codeine Other (See Comments)    Blood drops  . Morphine And Related Itching    Current Outpatient Prescriptions  Medication Sig Dispense Refill  . amiodarone (PACERONE) 400 MG tablet Take 1 tablet (400 mg total) by mouth 2 (two) times daily. X 1 week, then 200 mg po bid  75 tablet  1  . aspirin EC 81 MG EC tablet Take 1 tablet (81 mg total) by mouth daily.      . citalopram (CELEXA) 40 MG tablet Take 40 mg by mouth daily.        . enalapril (VASOTEC) 5 MG tablet Take 5 mg by mouth daily.        . lansoprazole (PREVACID) 15 MG capsule Take 15 mg by mouth daily as needed.        . metoprolol (LOPRESSOR) 50 MG tablet Take 1 tablet (50 mg total) by mouth 3 (three) times daily.  90 tablet  1  . rosuvastatin (CRESTOR) 10 MG  tablet Take 1 tablet (10 mg total) by mouth daily at 6 PM.  30 tablet  1  . traMADol (ULTRAM) 50 MG tablet Take 1 tablet (50 mg total) by mouth every 6 (six) hours as needed for pain. Maximum dose= 8 tablets per day  40 tablet  1  . warfarin (COUMADIN) 5 MG tablet Take 1 tablet (5 mg total) by mouth daily. Or as directed by Coumadin Clinic  60 tablet  1       Physical Exam: BP 111/74  Pulse 74  Resp 18  Ht 6' (1.829 m)  Wt 193 lb (87.544 kg)  BMI 26.18 kg/m2  SpO2 99%  General appearance: alert, cooperative and no distress Neurologic: intact Heart: regular rate and rhythm, S1, S2 normal, no murmur, click, rub or gallop Lungs: clear to auscultation bilaterally Abdomen: soft, non-tender; bowel sounds normal; no masses,  no organomegaly Extremities: extremities normal, atraumatic, no cyanosis or edema and Homans sign is negative, no sign of DVT Wounds:sternum stable  Diagnostic Studies & Laboratory data:         Recent Radiology Findings: Dg Chest 2 View  08/08/2011  *  RADIOLOGY REPORT*  Clinical Data: Post CABG and valve replacement, follow-up  CHEST - 2 VIEW  Comparison: Portable chest x-ray of 07/20/2011  Findings: Aeration has improved significantly.  The effusions have resolved as has the basilar atelectasis.  An aortic valve replacement is noted.  Mild cardiomegaly is stable.  Median sternotomy sutures are noted.  No bony abnormality is seen.  IMPRESSION: No active lung disease.  Post AVR.  Original Report Authenticated By: Juline Patch, M.D.      Recent Labs: Lab Results  Component Value Date   WBC 10.6* 07/24/2011   HGB 10.3* 07/24/2011   HCT 30.7* 07/24/2011   PLT 236 07/24/2011   GLUCOSE 91 07/24/2011   CHOL  Value: 280        ATP III CLASSIFICATION:  <200     mg/dL   Desirable  454-098  mg/dL   Borderline High  >=119    mg/dL   High* 14/01/8294   TRIG 155* 07/07/2008   HDL 35* 07/07/2008   LDLCALC  Value: 214        Total Cholesterol/HDL:CHD Risk Coronary Heart Disease  Risk Table                     Men   Women  1/2 Average Risk   3.4   3.3* 07/07/2008   ALT 30 07/14/2011   AST 32 07/14/2011   NA 132* 07/24/2011   K 3.7 07/24/2011   CL 96 07/24/2011   CREATININE 0.84 07/24/2011   BUN 14 07/24/2011   CO2 28 07/24/2011   TSH 1.243 test methodology is 3rd generation TSH 07/07/2008   INR 3.1 08/02/2011   HGBA1C 5.8* 07/14/2011      Assessment / Plan:      Overall very pleased with his progress. He notes that his INR was slightly elevated and has been adjusted he has an appointment tomorrow to see Dr. wall. I've explained to him that Coumadin with the tissue valve that he will not need to stay on Coumadin long-term and when cardiology is satisfied that he is maintaining sinus rhythm this could be discontinued and returned to full dose aspirin. I plan on seeing the patient back in 3 weeks if he is continuing to make good progress he was interested in being released to return to work at that time      Delight Ovens MD 08/08/2011 3:41 PM

## 2011-08-09 ENCOUNTER — Ambulatory Visit (INDEPENDENT_AMBULATORY_CARE_PROVIDER_SITE_OTHER): Payer: 59 | Admitting: Physician Assistant

## 2011-08-09 ENCOUNTER — Ambulatory Visit (INDEPENDENT_AMBULATORY_CARE_PROVIDER_SITE_OTHER): Payer: 59 | Admitting: *Deleted

## 2011-08-09 ENCOUNTER — Encounter: Payer: Self-pay | Admitting: Physician Assistant

## 2011-08-09 VITALS — BP 104/60 | HR 68 | Ht 72.0 in | Wt 191.0 lb

## 2011-08-09 DIAGNOSIS — Z954 Presence of other heart-valve replacement: Secondary | ICD-10-CM

## 2011-08-09 DIAGNOSIS — I4891 Unspecified atrial fibrillation: Secondary | ICD-10-CM

## 2011-08-09 DIAGNOSIS — I359 Nonrheumatic aortic valve disorder, unspecified: Secondary | ICD-10-CM

## 2011-08-09 DIAGNOSIS — I2510 Atherosclerotic heart disease of native coronary artery without angina pectoris: Secondary | ICD-10-CM

## 2011-08-09 DIAGNOSIS — Z951 Presence of aortocoronary bypass graft: Secondary | ICD-10-CM

## 2011-08-09 DIAGNOSIS — Z7901 Long term (current) use of anticoagulants: Secondary | ICD-10-CM

## 2011-08-09 LAB — POCT INR: INR: 3.5

## 2011-08-09 NOTE — Patient Instructions (Signed)
Your physician recommends that you schedule a follow-up appointment in: 2-3 months with Dr. Daleen Squibb.  You have been referred to Cardiac Rehab  If you have any questions please give [place] a call

## 2011-08-09 NOTE — Progress Notes (Signed)
HPI:  This is a 61 year old male patient who recently underwent CABG with the LIMA to the LAD, and aortic valve replacement.he had postop atrial fibrillation and is treated with amiodarone. He saw Dr. Tyrone Sage yesterday and is progressing nicely. He says he felt his heart pounding after he exerts himself and sits down. He does not notice that it is racing or irregular. He denies chest pain other than a little discomfort when he coughs. He denies dyspnea, dyspnea on exertion, dizziness, or presyncope. He is on Coumadin. He is walking 10 minutes 3 times a day and is hoping to start cardiac rehabilitation.  Allergies  Allergen Reactions  . Codeine Other (See Comments)    Blood drops  . Morphine And Related Itching  . Oxycodone     Intol    Current Outpatient Prescriptions on File Prior to Visit  Medication Sig Dispense Refill  . amiodarone (PACERONE) 400 MG tablet Take 1 tablet (400 mg total) by mouth 2 (two) times daily. X 1 week, then 200 mg po bid  75 tablet  1  . aspirin EC 81 MG EC tablet Take 1 tablet (81 mg total) by mouth daily.      . citalopram (CELEXA) 40 MG tablet Take 40 mg by mouth daily.        . enalapril (VASOTEC) 5 MG tablet Take 5 mg by mouth daily.        . lansoprazole (PREVACID) 15 MG capsule Take 15 mg by mouth daily as needed.        . metoprolol (LOPRESSOR) 50 MG tablet Take 1 tablet (50 mg total) by mouth 3 (three) times daily.  90 tablet  1  . rosuvastatin (CRESTOR) 10 MG tablet Take 1 tablet (10 mg total) by mouth daily at 6 PM.  30 tablet  1  . warfarin (COUMADIN) 5 MG tablet Take 1 tablet (5 mg total) by mouth daily. Or as directed by Coumadin Clinic  60 tablet  1    Past Medical History  Diagnosis Date  . Hypertension   . Hyperlipidemia   . Depression   . PVD (peripheral vascular disease)   . Femoral-popliteal bypass graft occlusion, right 1997    Dr. Hart [place]  . Claudication in peripheral vascular disease   . Aortic stenosis 2009    ef 40-45%   . Aortic  stenosis   . Aortic valve disorder   . Coronary artery disease   . Aneurysm of ascending aorta   . Shortness of breath   . GERD (gastroesophageal reflux disease)   . Headache   . Arthritis   . PONV (postoperative nausea and vomiting)     Past Surgical History  Procedure Date  . Rotator cuff repair     Left  . Cardiac catheterization 2009, 07/11/11    Dr. Eldridge Dace  . US echocardiography   . Knee arthroscopy     left  . Mri     to visualize aortic valve  . Femoral artery - popliteal artery bypass graft   . Aortic valve replacement 07/18/2011    Procedure: AORTIC VALVE REPLACEMENT (AVR);  Surgeon: Delight Ovens, MD;  Location: [facility] OR;  Service: Open Heart Surgery;  Laterality: N/A;  . Coronary artery bypass graft 07/18/2011    Procedure: CORONARY ARTERY BYPASS GRAFTING (CABG);  Surgeon: Delight Ovens, MD;  Location: [facility] OR;  Service: Open Heart Surgery;  Laterality: N/A;  times one to mammary artery, left    No family history on file.  History  Social History  . Marital Status: Married    Spouse Name: N/A    Number of Children: N/A  . Years of Education: N/A   Occupational History  . Not on file.   Social History Main Topics  . Smoking status: Never Smoker   . Smokeless tobacco: Not on file  . Alcohol Use: No  . Drug Use: No  . Sexually Active:    Other Topics Concern  . Not on file   Social History Narrative  . No narrative on file    ROS: See HPI Eyes: Negative Ears:Negative for hearing loss, tinnitus Cardiovascular:A little Soreness in chest, Negative for irregular heartbeat, dyspnea, dyspnea on exertion, near-syncope, orthopnea, paroxysmal nocturnal dyspnia and syncope,edema, claudication, cyanosis,.  Respiratory:   Negative for cough, hemoptysis, shortness of breath, sleep disturbances due to breathing, sputum production and wheezing.   Endocrine: Negative for cold intolerance and heat intolerance.  Hematologic/Lymphatic: Negative for adenopathy  and bleeding problem. Does not bruise/bleed easily. On Coumadin Musculoskeletal: Negative.   Gastrointestinal: Negative for nausea, vomiting, reflux, abdominal pain, diarrhea, constipation.   Genitourinary: Negative for bladder incontinence, dysuria, flank pain, frequency, hematuria, hesitancy, nocturia and urgency.  Neurological: Negative.  Allergic/Immunologic: Negative for environmental allergies.   PHYSICAL EXAM: Well-nournished, in no acute distress. Neck: No JVD, HJR, Bruit, or thyroid enlargement Lungs: No tachypnea, clear without wheezing, rales, or rhonchi Cardiovascular: RRR, PMI not displaced, crisp valve sounds, 1/6 systolic ejection murmur at the left sternal border, no gallops, bruit, thrill, or heave. Abdomen: BS normal. Soft without organomegaly, masses, lesions or tenderness. Extremities: without cyanosis, clubbing or edema. Good distal pulses bilateral SKin: Warm, no lesions or rashes  Musculoskeletal: No deformities Neuro: no focal signs  BP 104/60  Pulse 68  Ht 6' (1.829 m)  Wt 191 lb (86.637 kg)  BMI 25.90 kg/m2  NWG:NFAOZH sinus rhythm at 62 beats per minute inferolateral T-wave inversion

## 2011-08-09 NOTE — Assessment & Plan Note (Signed)
It had postop atrial fibrillation but is currently in normal sinus rhythm on amiodarone, metoprolol, and Coumadin.

## 2011-08-09 NOTE — Assessment & Plan Note (Signed)
Patient status post CABG x1 with LIMA to the LAD. He will begin cardiac rehabilitation soon. He is doing well.

## 2011-08-09 NOTE — Assessment & Plan Note (Addendum)
Patient is status post pericardial tissue valve in December 2011. He is doing well postop. He will have his INR checked today

## 2011-08-10 ENCOUNTER — Ambulatory Visit: Payer: 59 | Admitting: Cardiothoracic Surgery

## 2011-08-10 ENCOUNTER — Encounter: Payer: 59 | Admitting: *Deleted

## 2011-08-14 ENCOUNTER — Telehealth: Payer: Self-pay | Admitting: Cardiology

## 2011-08-14 NOTE — Telephone Encounter (Signed)
Called patient back. He is complaining of feeling dizzy with position changing from sitting to standing.  He states that this has been going on for 2 weeks. He states that he mentioned this to the PA that saw him last but symptoms have worsened lately. Denies any chest pain or SOB. I have scheduled him to see Dr.Wall tomorrow at 330 pm.

## 2011-08-14 NOTE — Telephone Encounter (Signed)
New msg Pt said that when he stands up from sitting position he gets dizzy and he feels like he is going to pass out. He said it has been happening for about two weeks. No sob or chest pain please call

## 2011-08-15 ENCOUNTER — Ambulatory Visit (INDEPENDENT_AMBULATORY_CARE_PROVIDER_SITE_OTHER): Payer: 59 | Admitting: Cardiology

## 2011-08-15 VITALS — BP 114/73 | HR 64 | Ht 72.0 in | Wt 195.0 lb

## 2011-08-15 DIAGNOSIS — I2510 Atherosclerotic heart disease of native coronary artery without angina pectoris: Secondary | ICD-10-CM

## 2011-08-15 DIAGNOSIS — I4891 Unspecified atrial fibrillation: Secondary | ICD-10-CM

## 2011-08-15 DIAGNOSIS — I359 Nonrheumatic aortic valve disorder, unspecified: Secondary | ICD-10-CM

## 2011-08-15 DIAGNOSIS — R42 Dizziness and giddiness: Secondary | ICD-10-CM

## 2011-08-15 MED ORDER — NAPROXEN SODIUM 220 MG PO TABS: 220.0000 mg | ORAL_TABLET | Freq: Two times a day (BID) | ORAL | Status: DC

## 2011-08-15 MED ORDER — METOPROLOL TARTRATE 50 MG PO TABS: 50.0000 mg | ORAL_TABLET | Freq: Two times a day (BID) | ORAL | Status: DC

## 2011-08-15 NOTE — Assessment & Plan Note (Signed)
By history, he isn't orthostatic. He also does not increase his heart rate with standing.  We'll decrease his Lopressor to 50 mg twice a day. I've advised him to stay well hydrated his urine clear. Orthostatic precautions reviewed as well.

## 2011-08-15 NOTE — Patient Instructions (Signed)
Your physician has recommended you make the following change in your medication: Decrease Lopressor frequency to twice daily.  Your physician has recommended you make the following change in your medication: Aleve/Naproxsyn 220mg  twice daily for 10 days or until gout symptoms clear up.  Increase oral fluid intake, stay well hydrated.  Your physician wants you to follow-up in: 4 weeks. You will receive a reminder letter in the mail two months in advance. If you don't receive a letter, please call our office to schedule the follow-up appointment.

## 2011-08-15 NOTE — Progress Notes (Signed)
HPI [name] comes in with complaint of dizziness with standing. He's had this over the last couple of days.  He denies any chest pain, presyncope or syncope. This is not true vertigo.  His urine is somewhat concentrated and not clear. He is on 3 times a day of metoprolol.  He's also got a significant issue with gallop of his right big toe. Colchicine gave him diarrhea. He is no longer taking this.  He denies any palpitations or symptoms of A. Fib. He remains on amiodarone.  Past Medical History  Diagnosis Date  . Hypertension   . Hyperlipidemia   . Depression   . PVD (peripheral vascular disease)   . Femoral-popliteal bypass graft occlusion, right 1997    Dr. Hart Rochester  . Claudication in peripheral vascular disease   . Aortic stenosis 2009    ef 40-45%   . Aortic stenosis   . Aortic valve disorder   . Coronary artery disease   . Aneurysm of ascending aorta   . Shortness of breath   . GERD (gastroesophageal reflux disease)   . Headache   . Arthritis   . PONV (postoperative nausea and vomiting)     Current Outpatient Prescriptions  Medication Sig Dispense Refill  . amiodarone (PACERONE) 400 MG tablet Take 1 tablet (400 mg total) by mouth 2 (two) times daily. X 1 week, then 200 mg po bid  75 tablet  1  . aspirin EC 81 MG EC tablet Take 1 tablet (81 mg total) by mouth daily.      . citalopram (CELEXA) 40 MG tablet Take 40 mg by mouth daily.        . enalapril (VASOTEC) 5 MG tablet Take 5 mg by mouth daily.        . lansoprazole (PREVACID) 15 MG capsule Take 15 mg by mouth daily as needed.        . metoprolol (LOPRESSOR) 50 MG tablet Take 1 tablet (50 mg total) by mouth 3 (three) times daily.  90 tablet  1  . rosuvastatin (CRESTOR) 10 MG tablet Take 1 tablet (10 mg total) by mouth daily at 6 PM.  30 tablet  1  . warfarin (COUMADIN) 5 MG tablet Take 1 tablet (5 mg total) by mouth daily. Or as directed by Coumadin Clinic  60 tablet  1    Allergies  Allergen Reactions  .  Codeine Other (See Comments)    Blood drops  . Morphine And Related Itching  . Oxycodone     Intol    No family history on file.  History   Social History  . Marital Status: Married    Spouse Name: N/A    Number of Children: N/A  . Years of Education: N/A   Occupational History  . [employer]    Social History Main Topics  . Smoking status: Never Smoker   . Smokeless tobacco: Not on file  . Alcohol Use: No  . Drug Use: No  . Sexually Active:    Other Topics Concern  . Not on file   Social History Narrative  . No narrative on file    ROS ALL NEGATIVE EXCEPT THOSE NOTED IN HPI  PE Orthostatics taken and he is not orthostatic. However his heart rate remains around 58-64 range. General Appearance: well developed, well nourished in no acute distress HEENT: symmetrical face, PERRLA, good dentition  Neck: no JVD, thyromegaly, or adenopathy, trachea midline Chest: symmetric without deformity Cardiac: PMI non-displaced, RRR, normal S1, S2, no  gallop or murmur Lung: clear to ausculation and percussion Vascular: all pulses full without bruits  Abdominal: nondistended, nontender, good bowel sounds, no HSM, no bruits Extremities: no cyanosis, clubbing or edema, no sign of DVT, no varicosities  Skin: normal color, no rashes Neuro: alert and oriented x 3, non-focal Pysch: normal affect  EKG  BMET    Component Value Date/Time   NA 132* 07/24/2011 0610   K 3.7 07/24/2011 0610   CL 96 07/24/2011 0610   CO2 28 07/24/2011 0610   GLUCOSE 91 07/24/2011 0610   BUN 14 07/24/2011 0610   CREATININE 0.84 07/24/2011 0610   CALCIUM 8.8 07/24/2011 0610   GFRNONAA >90 07/24/2011 0610   GFRAA >90 07/24/2011 0610    Lipid Panel     Component Value Date/Time   CHOL  Value: 280        ATP III CLASSIFICATION:  <200     mg/dL   Desirable  621-308  mg/dL   Borderline High  >=657    mg/dL   High* 84/12/9627 5284   TRIG 155* 07/07/2008 0410   HDL 35* 07/07/2008 0410   CHOLHDL 8.0  07/07/2008 0410   VLDL 31 07/07/2008 0410   LDLCALC  Value: 214        Total Cholesterol/HDL:CHD Risk Coronary Heart Disease Risk Table                     Men   Women  1/2 Average Risk   3.4   3.3* 07/07/2008 0410    CBC    Component Value Date/Time   WBC 10.6* 07/24/2011 0610   RBC 3.41* 07/24/2011 0610   HGB 10.3* 07/24/2011 0610   HCT 30.7* 07/24/2011 0610   PLT 236 07/24/2011 0610   MCV 90.0 07/24/2011 0610   MCH 30.2 07/24/2011 0610   MCHC 33.6 07/24/2011 0610   RDW 13.4 07/24/2011 0610   LYMPHSABS 2.8 07/06/2011 1452   MONOABS 0.7 07/06/2011 1452   EOSABS 0.1 07/06/2011 1452   BASOSABS 0.0 07/06/2011 1452

## 2011-08-17 ENCOUNTER — Encounter (HOSPITAL_COMMUNITY): Payer: Self-pay

## 2011-08-17 ENCOUNTER — Encounter (HOSPITAL_COMMUNITY)
Admission: RE | Admit: 2011-08-17 | Discharge: 2011-08-17 | Disposition: A | Discharge status: Home / Self Care | Payer: 59 | Source: Ambulatory Visit | Attending: Cardiology | Admitting: Cardiology

## 2011-08-17 DIAGNOSIS — I359 Nonrheumatic aortic valve disorder, unspecified: Secondary | ICD-10-CM | POA: Insufficient documentation

## 2011-08-17 DIAGNOSIS — I2510 Atherosclerotic heart disease of native coronary artery without angina pectoris: Secondary | ICD-10-CM | POA: Insufficient documentation

## 2011-08-17 DIAGNOSIS — Q231 Congenital insufficiency of aortic valve: Secondary | ICD-10-CM | POA: Insufficient documentation

## 2011-08-17 DIAGNOSIS — Z5189 Encounter for other specified aftercare: Secondary | ICD-10-CM | POA: Insufficient documentation

## 2011-08-17 DIAGNOSIS — I209 Angina pectoris, unspecified: Secondary | ICD-10-CM | POA: Insufficient documentation

## 2011-08-18 ENCOUNTER — Ambulatory Visit (INDEPENDENT_AMBULATORY_CARE_PROVIDER_SITE_OTHER): Payer: 59 | Admitting: *Deleted

## 2011-08-18 DIAGNOSIS — I4891 Unspecified atrial fibrillation: Secondary | ICD-10-CM

## 2011-08-18 DIAGNOSIS — Z7901 Long term (current) use of anticoagulants: Secondary | ICD-10-CM

## 2011-08-18 DIAGNOSIS — Z954 Presence of other heart-valve replacement: Secondary | ICD-10-CM

## 2011-08-18 DIAGNOSIS — I359 Nonrheumatic aortic valve disorder, unspecified: Secondary | ICD-10-CM

## 2011-08-18 LAB — POCT INR: INR: 1.9

## 2011-08-21 ENCOUNTER — Other Ambulatory Visit: Payer: Self-pay | Admitting: Pharmacist

## 2011-08-21 MED ORDER — AMIODARONE HCL 200 MG PO TABS: 200.0000 mg | ORAL_TABLET | Freq: Two times a day (BID) | ORAL | Status: DC

## 2011-08-23 ENCOUNTER — Telehealth: Payer: Self-pay | Admitting: *Deleted

## 2011-08-23 ENCOUNTER — Encounter (HOSPITAL_COMMUNITY)
Admission: RE | Admit: 2011-08-23 | Discharge: 2011-08-23 | Disposition: A | Discharge status: Home / Self Care | Payer: 59 | Source: Ambulatory Visit | Attending: Cardiology | Admitting: Cardiology

## 2011-08-23 NOTE — Telephone Encounter (Signed)
CR Maria calls today b/c pt was still experiencing dizziness at home.  He did well in rehab no dizziness  bp 98/60-66. I called pt. According to him he is not dizzy when going from a lying to sitting position. Dizziness only occurs sitting to standing. Reviewed & pt is following orthostatic precautions.  He has been taking metoprolol in the am and at 6pm. He will take pm dose at bedtime.  Also encouraged pt to have snacks between meals.           Appetite slowly improving acc. To pt. Encouraged fluid intake. Pt will call back for update or if symptoms become worse. Mylo Red RN

## 2011-08-23 NOTE — Progress Notes (Signed)
Pt started cardiac rehab today.  Pt tolerated light exercise without difficulty. Telemetry sinus brady, sinus rhythm with rare PVC noted. No complaints of dizziness during exercise today..  Pre/ Post exercise blood pressures 98/60 and 98/66.  Will continue to monitor the patient throughout  the program. Weyman Croon says he still fell dizzy sometimes at home when changing from sitting to standing position at home. Will notify Dr Daleen Squibb.

## 2011-08-25 ENCOUNTER — Encounter (HOSPITAL_COMMUNITY)
Admission: RE | Admit: 2011-08-25 | Discharge: 2011-08-25 | Disposition: A | Discharge status: Home / Self Care | Payer: 59 | Source: Ambulatory Visit | Attending: Cardiology | Admitting: Cardiology

## 2011-08-25 NOTE — Progress Notes (Signed)
Reviewed home exercise with pt today.  Pt plans to walk at home for exercise.  Reviewed THR, pulse, RPE, sign and symptoms, and when to call 911 or MD.  Pt voiced understanding. Maliya Marich, MA, ACSM RCEP   

## 2011-08-28 ENCOUNTER — Telehealth: Payer: Self-pay | Admitting: Cardiology

## 2011-08-28 NOTE — Telephone Encounter (Signed)
New Problem:    Patient called in needing a refill of his rosuvastatin (CRESTOR) 10 MG tablet BUT only wants the refill if there is a generic form of it available. If not then he would like a call back to discuss his options. Please call back if necessary.   [name]:   Patient is still having issues with vertigo when he rises form a seated position.  Please call back.

## 2011-08-28 NOTE — Telephone Encounter (Signed)
Spoke with pt. Samples of crestor left at front desk. Pt reports dizziness is not as prevalent as before. Still occurs at times. Appetite improved.fluid intake improved. Would like to know if there is a generic cholesterol medication he could take instead of crestor. will forward to dr wall for review. Mylo Red RN

## 2011-08-30 ENCOUNTER — Encounter (HOSPITAL_COMMUNITY)
Admission: RE | Admit: 2011-08-30 | Discharge: 2011-08-30 | Disposition: A | Discharge status: Home / Self Care | Payer: 59 | Source: Ambulatory Visit | Attending: Cardiology | Admitting: Cardiology

## 2011-08-30 NOTE — Telephone Encounter (Signed)
lmtcb re: changing statin if needed due to expense. Per Dr. Daleen Squibb could switch to Atorvastatin 20mg  or Simvastatin 40mg  Mylo Red RN

## 2011-08-30 NOTE — Progress Notes (Signed)
[name] 61 y.o. male       Nutrition Screen                                                                    YES  NO Do you live in a nursing home?  X   Do you eat out more than 3 times/week?    X If yes, how many times per week do you eat out?  Do you have food allergies?   X If yes, what are you allergic to?  Have you gained or lost more than 10 lbs without trying?              X  If yes, how much weight have you lost and over what time period?  19 lbs lost over 4 weeks due to surgery  Do you want to lose weight?    X  If yes, what is a goal weight or amount of weight you would like to lose? 5-6 lbs  Do you eat alone most of the time?   X   Do you eat less than 2 meals/day?  X If yes, how many meals do you eat?  Do you drink more than 3 alcohol drinks/day?  X If yes, how many drinks per day?  Are you having trouble with constipation? *  X If yes, what are you doing to help relieve constipation?  Do you have financial difficulties with buying food?*    X   Are you experiencing regular nausea/ vomiting?*     X   Do you have a poor appetite? *                                       X    Do you have trouble chewing/swallowing? *   X    Pt with diagnoses of:  X CABG              X GERD          X AVR X Dyslipidemia  / HDL< 40 / LDL>70 / High TG      X %  Body fat >goal / Body Mass Index >25 X HTN / BP >120/80 X Pre-diabetes X Gout            Pt Risk Score   7       Diagnosis Risk Score  40       Total Risk Score   47                         X High Risk                Low Risk              HT: 70" Ht Readings from Last 1 Encounters:  08/17/11 5\' 10"  (1.778 m)    WT:   197.8 lb (89.9 kg) Wt Readings from Last 3 Encounters:  08/17/11 198 lb 3.1 oz (89.9 kg)  08/15/11 195 lb (88.451 kg)  08/09/11 191 lb (86.637 kg)     IBW 75.5 119%IBW BMI 28.4 30%body fat  Meds reviewed: Warfarin  Past Medical History  Diagnosis Date  . Hypertension   . Hyperlipidemia     . Depression   . PVD (peripheral vascular disease)   . Femoral-popliteal bypass graft occlusion, right 1997    Dr. Hart [place]  . Claudication in peripheral vascular disease   . Aortic stenosis 2009    ef 40-45%   . Aortic stenosis   . Aortic valve disorder   . Coronary artery disease   . Aneurysm of ascending aorta   . Shortness of breath   . GERD (gastroesophageal reflux disease)   . Headache   . Arthritis   . PONV (postoperative nausea and vomiting)         Activity level: Pt is moderately active  Wt goal: 174-186 lb ( 79.1-84.5 kg) Current tobacco use? No      Food/Drug Interaction? Yes If Y, which med(s)? Warfarin If yes, pt given Food/Drug Interaction handout? Yes  Labs:  Lipid Panel  No recent lipid values available at this time. Lab Results  Component Value Date   HGBA1C 5.8* 07/14/2011   07/24/11 Glucose 91   LDL goal:< 70  PVD and > 2:  HTN, HDL, > 59 yo male Estimated Daily Nutrition Needs for: ? wt loss  1600-2100 Kcal , Total Fat 40-55 gm, Saturated Fat 12-16 gm, Trans Fat 1.5-2.1 gm,  Sodium less than 1500 mg

## 2011-08-30 NOTE — Progress Notes (Signed)
[name] has a f/u appointment with Dr Tyrone Sage tomorrow.  Will send exercise flow sheet from cardiac rehab via Careers information officer.

## 2011-08-31 ENCOUNTER — Encounter: Payer: Self-pay | Admitting: Cardiothoracic Surgery

## 2011-08-31 ENCOUNTER — Ambulatory Visit (INDEPENDENT_AMBULATORY_CARE_PROVIDER_SITE_OTHER): Payer: Self-pay | Admitting: Cardiothoracic Surgery

## 2011-08-31 VITALS — BP 109/66 | HR 56 | Resp 16 | Ht 72.0 in | Wt 202.0 lb

## 2011-08-31 DIAGNOSIS — Z09 Encounter for follow-up examination after completed treatment for conditions other than malignant neoplasm: Secondary | ICD-10-CM

## 2011-08-31 DIAGNOSIS — I359 Nonrheumatic aortic valve disorder, unspecified: Secondary | ICD-10-CM

## 2011-08-31 DIAGNOSIS — I2510 Atherosclerotic heart disease of native coronary artery without angina pectoris: Secondary | ICD-10-CM

## 2011-08-31 MED ORDER — AMIODARONE HCL 200 MG PO TABS: 200.0000 mg | ORAL_TABLET | Freq: Every day | ORAL | Status: DC

## 2011-08-31 NOTE — Patient Instructions (Signed)
Doing well  Not lifting over 3 months Endocarditis Information  You may be at risk for developing endocarditis since you have  an artificial heart valve  or a repaired heart valve. Endocarditis is an infection of the lining of the heart or heart valves.   Certain surgical and dental procedures may put you at risk,  such as teeth cleaning or other dental procedures or any surgery involving the respiratory, urinary, gastrointestinal tract, gallbladder or prostate.   Notify your doctor or dentist before having any invasive procedures. You will need to take antibiotics before certain procedures.   To prevent endocarditis, maintain good oral health. Seek prompt medical attention for any mouth/gum, skin or urinary tract infections.    Aortic Valve Replacement-Care After  Read the instructions outlined below and refer to this sheet for the next few weeks. These discharge instructions provide you with general information on caring for yourself after you leave the hospital. Your surgeon may also give you specific instructions. While your treatment has been planned according to the most current medical practices available, unavoidable complications occasionally occur. If you have any problems or questions after discharge, please call your surgeon. AFTER THE PROCEDURE  Full recovery from heart valve surgery can take several months.   Blood thinning (anticoagulation) treatment with warfarin is some times prescribed for 6 weeks to 3 months after surgery for those with biological valves. It is prescribed for life for those with mechanical valves.   Recovery includes healing of the surgical incision. There is a gradual building of stamina and exercise abilities. An exercise program under the direction of a physical therapist may be recommended.   Once you have an artificial valve, your heart function and your life will return to normal. You usually feel better after surgery. Shortness of breath and fatigue  should lessen. If your heart was already severely damaged before your surgery, you may continue to have problems.    Individuals with an aortic valve replacement need to take antibiotics before having dental work or other surgical procedures. This is called prophylactic antibiotic treatment. These drugs help to prevent infective endocarditis. Antibiotics are only recommended for individuals with the highest risk for developing infective endocarditis. Let your dentist and your caregiver know if you have a history of any of the following so that the necessary precautions can be taken:  Endocarditis in the past.   An artificial (prosthetic) heart valve.  HOME CARE INSTRUCTIONS   Use all medications as prescribed.   Take your temperature every morning for the first week after surgery. Record these.   Weigh yourself every morning for at least the first week after surgery and record.   Do not lift more than 15 pounds until your breastbone (sternum) has healed, about 3 months. Avoid all activities which would place strain on your incision.   You may shower as soon as directed by your caregiver after surgery. Pat incisions dry. Do not rub incisions with washcloth or towel.   Avoid driving for 4 weeks following surgery or as instructed.  Pain Control  If a prescription was given for a pain reliever, please follow your doctor's directions.   If the pain is not relieved by your medicine, becomes worse, or you have difficulty breathing, call your surgeon.  Activity  Take frequent rest periods throughout the day.   Wait one week before returning to strenuous activities such as heavy lifting (more than 10 pounds), pushing or pulling.   Talk with your doctor about when you  may return to work and your exercise routine.   Do not drive while taking prescription pain medication.  Nutrition  You may resume your normal diet.   Drink plenty of fluids (6-8 glasses a day).   Eat a well-balanced diet.    Call your caregiver for persistent nausea or vomiting.  Elimination Your normal bowel function should return. If constipation should occur, you may:  Take a mild laxative.   Add fruit and bran to your diet.   Drink more fluids.   Call your doctor if constipation is not relieved.  SEEK IMMEDIATE MEDICAL CARE IF:   You develop chest pain which is not coming from your surgical cut (incision).   You develop shortness of breath or have difficulty breathing.   You develop a temperature over 101 F (38.3 C).   You have a sudden weight gain. Let your caregiver know what the weight gain is.   You develop a rash.   You develop any reaction or side effects to medications given.   You have increased bleeding from wounds.   You see redness, swelling, or have increasing pain in wounds.   You have pus coming from your wound.   You develop lightheadedness or feel faint.

## 2011-08-31 NOTE — Progress Notes (Signed)
[address]            [place] [address]          [phone]      [name] [facility] Medical Record #[id number] Date of Birth: 04/24/1951  Referring: Micheline Chapman, MD Primary Care: Marye Round, MD, MD  Chief Complaint:   POST OP FOLLOW UP SURGICAL PROCEDURE: Aortic valve replacement with a pericardial tissue  valve, 23 mm, [employer], model 3300TFX, serial [id number] and  coronary artery bypass grafting with the left internal mammary to the  circumflex coronary artery. 07/18/11  History of Present Illness:     Patient returns for postop followup after aortic valve replacement and coronary artery bypass grafting. He is making a good recovery his only complaint at this point is mild dizziness when he stands quickly. He's noted no recurrent atrial fibrillation he's had no overt symptoms of congestive heart failure or angina. He is anxious to return to work     Past Medical History  Diagnosis Date  . Hypertension   . Hyperlipidemia   . Depression   . PVD (peripheral vascular disease)   . Femoral-popliteal bypass graft occlusion, right 1997    Dr. Hart Rochester  . Claudication in peripheral vascular disease   . Aortic stenosis 2009    ef 40-45%   . Aortic stenosis   . Aortic valve disorder   . Coronary artery disease   . Aneurysm of ascending aorta   . Shortness of breath   . GERD (gastroesophageal reflux disease)   . Headache   . Arthritis   . PONV (postoperative nausea and vomiting)      History  Smoking status  . Never Smoker   Smokeless tobacco  . Not on file    History  Alcohol Use No     Allergies  Allergen Reactions  . Codeine Other (See Comments)    Blood drops  . Morphine And Related Itching  . Oxycodone     Intol    Current Outpatient Prescriptions  Medication Sig Dispense Refill  . amiodarone (PACERONE) 200 MG tablet Take 1 tablet (200 mg total) by mouth daily.      [name] aspirin EC 81  MG EC tablet Take 1 tablet (81 mg total) by mouth daily.      . citalopram (CELEXA) 40 MG tablet Take 40 mg by mouth daily.        . enalapril (VASOTEC) 5 MG tablet Take 5 mg by mouth daily.        . lansoprazole (PREVACID) 15 MG capsule Take 15 mg by mouth daily as needed.        . metoprolol (LOPRESSOR) 50 MG tablet Take 1 tablet (50 mg total) by mouth 2 (two) times daily.  60 tablet  3  . naproxen sodium (ALEVE) 220 MG tablet Take 1 tablet (220 mg total) by mouth 2 (two) times daily with a meal.  20 tablet  0  . rosuvastatin (CRESTOR) 10 MG tablet Take 1 tablet (10 mg total) by mouth daily at 6 PM.  30 tablet  1  . warfarin (COUMADIN) 5 MG tablet Take 1 tablet (5 mg total) by mouth daily. Or as directed by Coumadin Clinic  60 tablet  1       Physical Exam: BP 109/66  Pulse 56  Resp 16  Ht 6' (1.829 m)  Wt 202 lb (91.627 kg)  BMI 27.40 kg/m2  SpO2 98%  General appearance: alert, cooperative, appears stated age and no distress Neurologic: intact Heart: regular rate and rhythm, S1, S2 normal, no murmur, click, rub or gallop Lungs: clear to auscultation bilaterally Abdomen: soft, non-tender; bowel sounds normal; no masses,  no organomegaly Extremities: extremities normal, atraumatic, no cyanosis or edema and Homans sign is negative, no sign of DVT Valve sounds are crisp without murmur of aortic insufficiency  Diagnostic Studies & Laboratory data:     Recent Radiology Findings:   No results found.    Recent Lab Findings: Lab Results  Component Value Date   WBC 10.6* 07/24/2011   HGB 10.3* 07/24/2011   HCT 30.7* 07/24/2011   PLT 236 07/24/2011   GLUCOSE 91 07/24/2011   CHOL  Value: 280        ATP III CLASSIFICATION:  <200     mg/dL   Desirable  161-096  mg/dL   Borderline High  >=045    mg/dL   High* 40/03/8118   TRIG 155* 07/07/2008   HDL 35* 07/07/2008   LDLCALC  Value: 214        Total Cholesterol/HDL:CHD Risk Coronary Heart Disease Risk Table                     Men    Women  1/2 Average Risk   3.4   3.3* 07/07/2008   ALT 30 07/14/2011   AST 32 07/14/2011   NA 132* 07/24/2011   K 3.7 07/24/2011   CL 96 07/24/2011   CREATININE 0.84 07/24/2011   BUN 14 07/24/2011   CO2 28 07/24/2011   TSH 1.243 Test methodology is 3rd generation TSH 07/07/2008   INR 1.9 08/18/2011   HGBA1C 5.8* 07/14/2011      Assessment / Plan:     Patient's doing well postoperatively, we discussed returning to work part-time for the first week starting February 4 and then return to full-time work the following week. Is currently enrolled in cardiac rehabilitation program and done well. I have decreased his dose of amiodarone to 200 mg once a day he will discuss with Dr. wall at what point he can discontinue the amiodarone and Coumadin. I've not made him a return appointment to see me but would be glad to see him in his or Dr. Anola Gurney requested anytime       Delight Ovens MD  Beeper [phone] Office [phone] 08/31/2011 11:05 AM

## 2011-09-01 ENCOUNTER — Ambulatory Visit (INDEPENDENT_AMBULATORY_CARE_PROVIDER_SITE_OTHER): Payer: 59 | Admitting: *Deleted

## 2011-09-01 ENCOUNTER — Encounter (HOSPITAL_COMMUNITY)
Admission: RE | Admit: 2011-09-01 | Discharge: 2011-09-01 | Disposition: A | Discharge status: Home / Self Care | Payer: 59 | Source: Ambulatory Visit | Attending: Cardiology | Admitting: Cardiology

## 2011-09-01 DIAGNOSIS — Z954 Presence of other heart-valve replacement: Secondary | ICD-10-CM

## 2011-09-01 DIAGNOSIS — Q231 Congenital insufficiency of aortic valve: Secondary | ICD-10-CM | POA: Insufficient documentation

## 2011-09-01 DIAGNOSIS — I2510 Atherosclerotic heart disease of native coronary artery without angina pectoris: Secondary | ICD-10-CM | POA: Insufficient documentation

## 2011-09-01 DIAGNOSIS — Z7901 Long term (current) use of anticoagulants: Secondary | ICD-10-CM

## 2011-09-01 DIAGNOSIS — I359 Nonrheumatic aortic valve disorder, unspecified: Secondary | ICD-10-CM | POA: Insufficient documentation

## 2011-09-01 DIAGNOSIS — Z5189 Encounter for other specified aftercare: Secondary | ICD-10-CM | POA: Insufficient documentation

## 2011-09-01 DIAGNOSIS — I209 Angina pectoris, unspecified: Secondary | ICD-10-CM | POA: Insufficient documentation

## 2011-09-01 DIAGNOSIS — I4891 Unspecified atrial fibrillation: Secondary | ICD-10-CM

## 2011-09-01 LAB — POCT INR: INR: 2.8

## 2011-09-04 ENCOUNTER — Encounter (HOSPITAL_COMMUNITY)
Admission: RE | Admit: 2011-09-04 | Discharge: 2011-09-04 | Disposition: A | Discharge status: Home / Self Care | Payer: 59 | Source: Ambulatory Visit | Attending: Cardiology | Admitting: Cardiology

## 2011-09-04 ENCOUNTER — Other Ambulatory Visit: Payer: Self-pay | Admitting: Emergency Medicine

## 2011-09-06 ENCOUNTER — Encounter (HOSPITAL_COMMUNITY)
Admission: RE | Admit: 2011-09-06 | Discharge: 2011-09-06 | Disposition: A | Discharge status: Home / Self Care | Payer: 59 | Source: Ambulatory Visit | Attending: Cardiology | Admitting: Cardiology

## 2011-09-06 ENCOUNTER — Encounter (HOSPITAL_COMMUNITY): Payer: 59

## 2011-09-06 NOTE — Progress Notes (Signed)
[name] is exercising in the 1:15 class this week.  Stan's last day of exercise will be on Friday 09/08/11, he is returning to work

## 2011-09-08 ENCOUNTER — Encounter (HOSPITAL_COMMUNITY)
Admission: RE | Admit: 2011-09-08 | Discharge: 2011-09-08 | Disposition: A | Discharge status: Home / Self Care | Payer: 59 | Source: Ambulatory Visit | Attending: Cardiology | Admitting: Cardiology

## 2011-09-08 NOTE — Progress Notes (Signed)
Cardiac Rehabilitation Program Progress Report   Orientation:  08/12/2011 Graduate Date:   Discharge Date:  09/08/2011 # of sessions completed: 7  Cardiologist: Daleen Squibb Family MD: Baruch Merl Time:  0945/1315   A.  Exercise Program:  Tolerates exercise @ 3.3 METS for 30 minutes and Discharged to home exercise program.  Anticipated compliance:  fair  B.  Mental Health:    C.  Education/Instruction/Skills  Accurately checks own pulse, Knows THR for exercise, Uses Perceived Exertion Scale and Attended 4 education classes    D.  Nutrition/Weight Control/Body Composition:  Patient has gained 2.6 kg BMI 29.3  *This section completed by Mickle Plumb, Andres Shad, RD, LDN, CDE  E.  Blood Lipids    Lab Results  Component Value Date   CHOL  Value: 280        ATP III CLASSIFICATION:  <200     mg/dL   Desirable  914-782  mg/dL   Borderline High  >=956    mg/dL   High* 21/09/863     Lab Results  Component Value Date   TRIG 155* 07/07/2008     Lab Results  Component Value Date   HDL 35* 07/07/2008     Lab Results  Component Value Date   CHOLHDL 8.0 07/07/2008     No results found for this basename: LDLDIRECT      F.  Lifestyle Changes:    G.  Symptoms noted with exercise:  Asymptomatic  Report Completed By:  Dayton Martes   Comments:  Pt discharged early, returning to work. Electronically signed by Harriett Sine MS on Friday 09/08/2011 at 1528

## 2011-09-13 ENCOUNTER — Encounter (HOSPITAL_COMMUNITY)
Admission: RE | Admit: 2011-09-13 | Discharge: 2011-09-13 | Payer: 59 | Source: Ambulatory Visit | Attending: Cardiology | Admitting: Cardiology

## 2011-09-13 NOTE — Progress Notes (Signed)
Nutrition Discharge Note Pt did not complete his Nutrition Survey.  No data to assess nutrition status. No recent lipids available to review with pt.

## 2011-09-14 ENCOUNTER — Ambulatory Visit (INDEPENDENT_AMBULATORY_CARE_PROVIDER_SITE_OTHER): Payer: 59 | Admitting: Cardiology

## 2011-09-14 ENCOUNTER — Encounter: Payer: Self-pay | Admitting: Cardiology

## 2011-09-14 ENCOUNTER — Ambulatory Visit (INDEPENDENT_AMBULATORY_CARE_PROVIDER_SITE_OTHER): Payer: 59 | Admitting: Pharmacist

## 2011-09-14 VITALS — BP 116/78 | HR 59 | Wt 207.0 lb

## 2011-09-14 DIAGNOSIS — Z954 Presence of other heart-valve replacement: Secondary | ICD-10-CM

## 2011-09-14 DIAGNOSIS — Z7901 Long term (current) use of anticoagulants: Secondary | ICD-10-CM

## 2011-09-14 DIAGNOSIS — R42 Dizziness and giddiness: Secondary | ICD-10-CM

## 2011-09-14 DIAGNOSIS — I4891 Unspecified atrial fibrillation: Secondary | ICD-10-CM

## 2011-09-14 DIAGNOSIS — I359 Nonrheumatic aortic valve disorder, unspecified: Secondary | ICD-10-CM

## 2011-09-14 DIAGNOSIS — I739 Peripheral vascular disease, unspecified: Secondary | ICD-10-CM

## 2011-09-14 DIAGNOSIS — I2510 Atherosclerotic heart disease of native coronary artery without angina pectoris: Secondary | ICD-10-CM

## 2011-09-14 LAB — POCT INR: INR: 3.9

## 2011-09-14 NOTE — Patient Instructions (Signed)
Your physician recommends that you schedule a follow-up appointment in: 3 months with Dr. Daleen Squibb.   Discontinue Amiodarone.   Continue Coumadin for 6 more weeks.

## 2011-09-14 NOTE — Assessment & Plan Note (Signed)
Markedly improved with hydration and decreasing beta blocker to twice a day. No change in management.

## 2011-09-14 NOTE — Assessment & Plan Note (Signed)
Continue for 6 more weeks then discontinue through the Coumadin clinic

## 2011-09-14 NOTE — Assessment & Plan Note (Signed)
Stable. No change in current medications. Continue secondary prevention.

## 2011-09-14 NOTE — Assessment & Plan Note (Signed)
Postoperative A. Fib has resolved. Stop amiodarone now and Coumadin in 6 weeks for coverage of the tissue valve.

## 2011-09-14 NOTE — Progress Notes (Signed)
HPI [name] returns  today for close followup after being seen for bradycardia and orthostatic hypotension. Please see my previous note.  Since reducing his beta blocker to twice a day encouraging hydration his orthostasis has largely resolved. He denies any palpitations or symptoms of atrial fib. He is about 7 weeks out from aortic valve replacement with a tissue valve and a left internal mammary graft to the LAD.  He is still on amiodarone and Coumadin.  Past Medical History  Diagnosis Date  . Hypertension   . Hyperlipidemia   . Depression   . PVD (peripheral vascular disease)   . Femoral-popliteal bypass graft occlusion, right 1997    Dr. Hart Rochester  . Claudication in peripheral vascular disease   . Aortic stenosis 2009    ef 40-45%   . Aortic stenosis   . Aortic valve disorder   . Coronary artery disease   . Aneurysm of ascending aorta   . Shortness of breath   . GERD (gastroesophageal reflux disease)   . Headache   . Arthritis   . PONV (postoperative nausea and vomiting)     Current Outpatient Prescriptions  Medication Sig Dispense Refill  . aspirin EC 81 MG EC tablet Take 1 tablet (81 mg total) by mouth daily.      . citalopram (CELEXA) 40 MG tablet Take 40 mg by mouth daily.        . enalapril (VASOTEC) 5 MG tablet Take 1 tablet (5 mg total) by mouth daily.  30 tablet  2  . lansoprazole (PREVACID) 15 MG capsule Take 15 mg by mouth daily as needed.        . metoprolol (LOPRESSOR) 50 MG tablet Take 1 tablet (50 mg total) by mouth 2 (two) times daily.  60 tablet  3  . naproxen sodium (ALEVE) 220 MG tablet Take 1 tablet (220 mg total) by mouth 2 (two) times daily with a meal.  20 tablet  0  . rosuvastatin (CRESTOR) 10 MG tablet Take 1 tablet (10 mg total) by mouth daily at 6 PM.  30 tablet  1  . warfarin (COUMADIN) 5 MG tablet Take 1 tablet (5 mg total) by mouth daily. Or as directed by [facility]  60 tablet  1    Allergies  Allergen Reactions  . Codeine Other (See  Comments)    Blood drops  . Morphine And Related Itching  . Oxycodone     Intol    No family history on file.  History   Social History  . Marital Status: Married    Spouse Name: N/A    Number of Children: N/A  . Years of Education: N/A   Occupational History  . [employer]    Social History Main Topics  . Smoking status: Never Smoker   . Smokeless tobacco: Not on file  . Alcohol Use: No  . Drug Use: No  . Sexually Active:    Other Topics Concern  . Not on file   Social History Narrative  . No narrative on file    ROS ALL NEGATIVE EXCEPT THOSE NOTED IN HPI  PE  General Appearance: well developed, well nourished in no acute distress HEENT: symmetrical face, PERRLA, good dentition  Neck: no JVD, thyromegaly, or adenopathy, trachea midline Chest: symmetric without deformity Cardiac: PMI non-displaced, RRR, normal S1, S2, no gallop or murmur Lung: clear to ausculation and percussion Vascular: all pulses full without bruits  Abdominal: nondistended, nontender, good bowel sounds, no HSM, no bruits Extremities:  no cyanosis, clubbing or edema, no sign of DVT, no varicosities  Skin: normal color, no rashes Neuro: alert and oriented x 3, non-focal Pysch: normal affect  EKG  BMET    Component Value Date/Time   NA 132* 07/24/2011 0610   K 3.7 07/24/2011 0610   CL 96 07/24/2011 0610   CO2 28 07/24/2011 0610   GLUCOSE 91 07/24/2011 0610   BUN 14 07/24/2011 0610   CREATININE 0.84 07/24/2011 0610   CALCIUM 8.8 07/24/2011 0610   GFRNONAA >90 07/24/2011 0610   GFRAA >90 07/24/2011 0610    Lipid Panel     Component Value Date/Time   CHOL  Value: 280        ATP III CLASSIFICATION:  <200     mg/dL   Desirable  161-096  mg/dL   Borderline High  >=045    mg/dL   High* 40/03/8118 1478   TRIG 155* 07/07/2008 0410   HDL 35* 07/07/2008 0410   CHOLHDL 8.0 07/07/2008 0410   VLDL 31 07/07/2008 0410   LDLCALC  Value: 214        Total Cholesterol/HDL:CHD Risk Coronary Heart  Disease Risk Table                     Men   Women  1/2 Average Risk   3.4   3.3* 07/07/2008 0410    CBC    Component Value Date/Time   WBC 10.6* 07/24/2011 0610   RBC 3.41* 07/24/2011 0610   HGB 10.3* 07/24/2011 0610   HCT 30.7* 07/24/2011 0610   PLT 236 07/24/2011 0610   MCV 90.0 07/24/2011 0610   MCH 30.2 07/24/2011 0610   MCHC 33.6 07/24/2011 0610   RDW 13.4 07/24/2011 0610   LYMPHSABS 2.8 07/06/2011 1452   MONOABS 0.7 07/06/2011 1452   EOSABS 0.1 07/06/2011 1452   BASOSABS 0.0 07/06/2011 1452

## 2011-09-15 ENCOUNTER — Encounter (HOSPITAL_COMMUNITY): Payer: 59

## 2011-09-18 ENCOUNTER — Encounter: Payer: Self-pay | Admitting: *Deleted

## 2011-09-18 ENCOUNTER — Telehealth: Payer: Self-pay | Admitting: Cardiology

## 2011-09-18 NOTE — Telephone Encounter (Signed)
FU Call: pt returning call from our office. Please call back.  

## 2011-09-18 NOTE — Telephone Encounter (Signed)
SPOKE WITH PT  RE MESSAGE  WAS CALLING ACCORDING TO PT RE STUDY  ON PSORIASIS  AMY  AT DR Myrtha Mantis

## 2011-09-18 NOTE — Telephone Encounter (Signed)
LMTCB ./CY 

## 2011-09-18 NOTE — Telephone Encounter (Signed)
Before he had heart surgery he was involved in a study regarding psoriasis and he was wondering if he could go back on the study and take the medication they offer because they are calling him to go back into study for the next 23yrs. He doesn't know the name of the study drug but he has the provider name and number

## 2011-09-18 NOTE — Telephone Encounter (Signed)
FINISHING MESSAGE,  MESSAGE BELOW INCOMPLETE, PT WAS TERMINIATED FROM STUDY AMY  WILL CALL PT  WITH DETAILS .Zack Seal

## 2011-09-20 ENCOUNTER — Encounter (HOSPITAL_COMMUNITY): Payer: 59

## 2011-09-22 ENCOUNTER — Encounter: Payer: 59 | Admitting: *Deleted

## 2011-09-22 ENCOUNTER — Encounter (HOSPITAL_COMMUNITY): Payer: 59

## 2011-09-27 ENCOUNTER — Encounter (HOSPITAL_COMMUNITY): Payer: 59

## 2011-09-29 ENCOUNTER — Encounter (HOSPITAL_COMMUNITY): Payer: 59

## 2011-10-04 ENCOUNTER — Ambulatory Visit: Payer: 59 | Admitting: Cardiology

## 2011-10-04 ENCOUNTER — Encounter (HOSPITAL_COMMUNITY): Payer: 59

## 2011-10-04 ENCOUNTER — Ambulatory Visit (INDEPENDENT_AMBULATORY_CARE_PROVIDER_SITE_OTHER): Payer: 59 | Admitting: Pharmacist

## 2011-10-04 DIAGNOSIS — Z954 Presence of other heart-valve replacement: Secondary | ICD-10-CM

## 2011-10-04 DIAGNOSIS — I359 Nonrheumatic aortic valve disorder, unspecified: Secondary | ICD-10-CM

## 2011-10-04 DIAGNOSIS — Z7901 Long term (current) use of anticoagulants: Secondary | ICD-10-CM

## 2011-10-04 DIAGNOSIS — I4891 Unspecified atrial fibrillation: Secondary | ICD-10-CM

## 2011-10-04 LAB — POCT INR: INR: 2.2

## 2011-10-06 ENCOUNTER — Ambulatory Visit: Payer: 59 | Admitting: Cardiology

## 2011-10-06 ENCOUNTER — Encounter (HOSPITAL_COMMUNITY): Payer: 59

## 2011-10-11 ENCOUNTER — Encounter (HOSPITAL_COMMUNITY): Payer: 59

## 2011-10-13 ENCOUNTER — Encounter (HOSPITAL_COMMUNITY): Payer: 59

## 2011-10-17 ENCOUNTER — Telehealth: Payer: Self-pay | Admitting: Cardiology

## 2011-10-17 NOTE — Telephone Encounter (Signed)
10/17/11--spoke with pt who states HA started about 2 weeks ago and he gets them after he wakens and last until he goes to bed--h/a sits behind eyes and forehead--advised since no other symptoms he could try allergy med without pseuophedrine as pollen is starting --if no relief call us back or call PCP--pt agrees--nt

## 2011-10-17 NOTE — Telephone Encounter (Signed)
Please return call to patient- hm# [phone]   Patient has been having headaches for the last couple of weeks, would like to discuss with a nurse.  He can be reached at hm# [phone]

## 2011-10-18 ENCOUNTER — Encounter (HOSPITAL_COMMUNITY): Payer: 59

## 2011-10-20 ENCOUNTER — Encounter (HOSPITAL_COMMUNITY): Payer: 59

## 2011-10-24 ENCOUNTER — Telehealth: Payer: Self-pay | Admitting: Cardiology

## 2011-10-24 NOTE — Telephone Encounter (Signed)
Please return call to patient at hm# [phone] concerning seasonal allergy medicine

## 2011-10-24 NOTE — Telephone Encounter (Signed)
Patient called no answer.LMTC. 

## 2011-10-24 NOTE — Telephone Encounter (Signed)
FU Call: Pt calling wanting to speak with nurse/MD regarding OTC medication for sinus problems/headaches not working and wanting to speak w/ nurse/MD regarding some other solutions. Please return pt call to discuss further.

## 2011-10-24 NOTE — Telephone Encounter (Signed)
Patient called stated he has had a yellow sinus drainage for 2 weeks.Stated he has taken allegra and off brand allergy med with no relief.Advised to call PCP.

## 2011-10-24 NOTE — Telephone Encounter (Signed)
Pt advised to take take plain Mucinex for congestion.

## 2011-10-25 ENCOUNTER — Encounter (HOSPITAL_COMMUNITY): Payer: 59

## 2011-10-27 ENCOUNTER — Encounter (HOSPITAL_COMMUNITY): Payer: 59

## 2011-11-01 ENCOUNTER — Ambulatory Visit (INDEPENDENT_AMBULATORY_CARE_PROVIDER_SITE_OTHER): Payer: 59 | Admitting: *Deleted

## 2011-11-01 DIAGNOSIS — I4891 Unspecified atrial fibrillation: Secondary | ICD-10-CM

## 2011-11-01 DIAGNOSIS — Z954 Presence of other heart-valve replacement: Secondary | ICD-10-CM

## 2011-11-01 DIAGNOSIS — Z7901 Long term (current) use of anticoagulants: Secondary | ICD-10-CM

## 2011-11-01 DIAGNOSIS — I359 Nonrheumatic aortic valve disorder, unspecified: Secondary | ICD-10-CM

## 2011-11-01 LAB — POCT INR: INR: 1.5

## 2011-11-04 ENCOUNTER — Other Ambulatory Visit: Payer: Self-pay | Admitting: Surgical

## 2011-11-07 ENCOUNTER — Other Ambulatory Visit: Payer: Self-pay | Admitting: Cardiology

## 2011-11-07 MED ORDER — ROSUVASTATIN CALCIUM 10 MG PO TABS: 10.0000 mg | ORAL_TABLET | Freq: Every day | ORAL | Status: DC

## 2011-11-07 NOTE — Telephone Encounter (Signed)
Refilled medication

## 2011-11-07 NOTE — Telephone Encounter (Signed)
Pt is out of pills 

## 2011-11-08 NOTE — Progress Notes (Signed)
Agree with progress report from cardiac rehab on 09/08/2011 written by Harriett Sine and and Heloise Purpura.

## 2011-11-14 ENCOUNTER — Telehealth: Payer: Self-pay | Admitting: Cardiology

## 2011-11-14 NOTE — Telephone Encounter (Signed)
Please advise, pt wants parameters for exercise. 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 Had CABG in December and is on coumadin. Informed pt Dr/nurse not here today and will get back with answer later in week. Pt agreed to plan.

## 2011-11-14 NOTE — Telephone Encounter (Signed)
Returned call, no answer.

## 2011-11-14 NOTE — Telephone Encounter (Signed)
Please return call to patient at [phone]  Patient would like return call from nurse regarding exercise and restrictons. He can be reached at [phone]

## 2011-11-15 ENCOUNTER — Encounter (INDEPENDENT_AMBULATORY_CARE_PROVIDER_SITE_OTHER): Payer: 59 | Admitting: Pharmacist

## 2011-11-15 DIAGNOSIS — Z7901 Long term (current) use of anticoagulants: Secondary | ICD-10-CM

## 2011-11-15 DIAGNOSIS — I359 Nonrheumatic aortic valve disorder, unspecified: Secondary | ICD-10-CM

## 2011-11-15 DIAGNOSIS — Z954 Presence of other heart-valve replacement: Secondary | ICD-10-CM

## 2011-11-15 DIAGNOSIS — I4891 Unspecified atrial fibrillation: Secondary | ICD-10-CM

## 2011-11-15 LAB — POCT INR: INR: 2.1

## 2011-11-23 NOTE — Telephone Encounter (Signed)
Dr. 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 Detailed message left for pt on personal identified voicemail. Mylo Red RN

## 2011-12-06 ENCOUNTER — Ambulatory Visit: Payer: 59 | Admitting: Cardiology

## 2011-12-11 ENCOUNTER — Other Ambulatory Visit: Payer: Self-pay | Admitting: Physician Assistant

## 2011-12-22 ENCOUNTER — Ambulatory Visit: Payer: 59 | Admitting: Cardiology

## 2011-12-27 ENCOUNTER — Encounter: Payer: Self-pay | Admitting: Cardiology

## 2011-12-27 ENCOUNTER — Ambulatory Visit (INDEPENDENT_AMBULATORY_CARE_PROVIDER_SITE_OTHER): Payer: 59 | Admitting: Cardiology

## 2011-12-27 VITALS — BP 114/68 | HR 64 | Ht 72.0 in | Wt 225.0 lb

## 2011-12-27 DIAGNOSIS — I350 Nonrheumatic aortic (valve) stenosis: Secondary | ICD-10-CM

## 2011-12-27 DIAGNOSIS — I359 Nonrheumatic aortic valve disorder, unspecified: Secondary | ICD-10-CM

## 2011-12-27 DIAGNOSIS — I2510 Atherosclerotic heart disease of native coronary artery without angina pectoris: Secondary | ICD-10-CM

## 2011-12-27 DIAGNOSIS — I739 Peripheral vascular disease, unspecified: Secondary | ICD-10-CM

## 2011-12-27 NOTE — Assessment & Plan Note (Signed)
I think this is most likely from deconditioning and weight gain. I've encouraged him to get back into cardiac rehabilitation and to lose about 25 pounds over the next 6 months.

## 2011-12-27 NOTE — Progress Notes (Signed)
HPI [name] returns today for evaluation and management of his history of coronary artery disease status post coronary bypass grafting and severe aortic stenosis status post aortic valve replacement this was performed in December of 2012. He's also had a right femoral-popliteal bypass graft on the right in the past.  He is back at work. His biggest complaint is some soreness at the top of the sternum from the incision. In addition he has been having some dyspnea on exertion that has actually gotten worse since cardiac rehabilitation. During that time, he's gained 25 pounds. 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 He tries to do stationary bike several days a week.  He denies any angina, presyncope or syncope.  He is thinking about getting back into cardiac rehabilitation and just paying for it.  Past Medical History  Diagnosis Date  . Hypertension   . Hyperlipidemia   . Depression   . PVD (peripheral vascular disease)   . Femoral-popliteal bypass graft occlusion, right 1997    Dr. Hart Rochester  . Claudication in peripheral vascular disease   . Aortic stenosis 2009    ef 40-45%   . Aortic stenosis   . Aortic valve disorder   . Coronary artery disease   . Aneurysm of ascending aorta   . Shortness of breath   . GERD (gastroesophageal reflux disease)   . Headache   . Arthritis   . PONV (postoperative nausea and vomiting)     Current Outpatient Prescriptions  Medication Sig Dispense Refill  . aspirin EC 81 MG EC tablet Take 1 tablet (81 mg total) by mouth daily.      . citalopram (CELEXA) 40 MG tablet Take 40 mg by mouth daily.        . enalapril (VASOTEC) 5 MG tablet Take 1 tablet (5 mg total) by mouth daily. NEEDS OFFICE VISIT  30 tablet  0  . lansoprazole (PREVACID) 15 MG capsule Take 15 mg by mouth daily as needed.        . metoprolol (LOPRESSOR) 50 MG tablet Take 1 tablet (50 mg total) by mouth 2 (two) times daily.  60 tablet  3  . naproxen sodium (ALEVE) 220 MG tablet Take 1 tablet (220 mg  total) by mouth 2 (two) times daily with a meal.  20 tablet  0  . rosuvastatin (CRESTOR) 10 MG tablet Take 1 tablet (10 mg total) by mouth daily.  30 tablet  6    Allergies  Allergen Reactions  . Codeine Other (See Comments)    Blood drops  . Morphine And Related Itching  . Oxycodone     Intol    No family history on file.  History   Social History  . Marital Status: Married    Spouse Name: N/A    Number of Children: N/A  . Years of Education: N/A   Occupational History  . [employer]    Social History Main Topics  . Smoking status: Never Smoker   . Smokeless tobacco: Not on file  . Alcohol Use: No  . Drug Use: No  . Sexually Active:    Other Topics Concern  . Not on file   Social History Narrative  . No narrative on file    ROS ALL NEGATIVE EXCEPT THOSE NOTED IN HPI  PE  General Appearance: well developed, well nourished in no acute distress, overweight HEENT: symmetrical face, PERRLA, good dentition  Neck: no JVD, thyromegaly, or adenopathy, trachea midline Chest: symmetric without deformity, sternal incision intact  with a small suprasternal notch Cardiac: PMI non-displaced, RRR, normal S1, S2, no gallop, 2/6 systolic murmur with normal split with no AI Lung: clear to ausculation and percussion Vascular: all pulses full without bruits  Abdominal: nondistended, nontender, good bowel sounds, no HSM, no bruits Extremities: no cyanosis, clubbing or edema, no sign of DVT, no varicosities  Skin: normal color, no rashes Neuro: alert and oriented x 3, non-focal Pysch: normal affect  EKG  BMET    Component Value Date/Time   NA 132* 07/24/2011 0610   K 3.7 07/24/2011 0610   CL 96 07/24/2011 0610   CO2 28 07/24/2011 0610   GLUCOSE 91 07/24/2011 0610   BUN 14 07/24/2011 0610   CREATININE 0.84 07/24/2011 0610   CALCIUM 8.8 07/24/2011 0610   GFRNONAA >90 07/24/2011 0610   GFRAA >90 07/24/2011 0610    Lipid Panel     Component Value Date/Time   CHOL   Value: 280        ATP III CLASSIFICATION:  <200     mg/dL   Desirable  213-086  mg/dL   Borderline High  >=578    mg/dL   High* 46/03/6294 2841   TRIG 155* 07/07/2008 0410   HDL 35* 07/07/2008 0410   CHOLHDL 8.0 07/07/2008 0410   VLDL 31 07/07/2008 0410   LDLCALC  Value: 214        Total Cholesterol/HDL:CHD Risk Coronary Heart Disease Risk Table                     Men   Women  1/2 Average Risk   3.4   3.3* 07/07/2008 0410    CBC    Component Value Date/Time   WBC 10.6* 07/24/2011 0610   RBC 3.41* 07/24/2011 0610   HGB 10.3* 07/24/2011 0610   HCT 30.7* 07/24/2011 0610   PLT 236 07/24/2011 0610   MCV 90.0 07/24/2011 0610   MCH 30.2 07/24/2011 0610   MCHC 33.6 07/24/2011 0610   RDW 13.4 07/24/2011 0610   LYMPHSABS 2.8 07/06/2011 1452   MONOABS 0.7 07/06/2011 1452   EOSABS 0.1 07/06/2011 1452   BASOSABS 0.0 07/06/2011 1452

## 2011-12-27 NOTE — Assessment & Plan Note (Signed)
Status post coronary bypass grafting. Stable.

## 2011-12-27 NOTE — Patient Instructions (Signed)
Your physician recommends that you continue on your current medications as directed. Please refer to the Current Medication list given to you today.  Your physician wants you to follow-up in:  1 year. You will receive a reminder letter in the mail two months in advance. If you don't receive a letter, please call our office to schedule the follow-up appointment.  Your physician encouraged you to lose weight for better health.  A weight loss of 2 pounds per week for a total of 25 pounds is recommended  Your physician discussed the importance of regular exercise and recommended that you start or continue a regular exercise program for good health.

## 2011-12-27 NOTE — Assessment & Plan Note (Signed)
Status  post aortic valve replacement. Stable.

## 2011-12-27 NOTE — Assessment & Plan Note (Signed)
Stable.  No symptoms of claudication.

## 2012-01-07 ENCOUNTER — Other Ambulatory Visit: Payer: Self-pay | Admitting: Cardiology

## 2012-01-08 ENCOUNTER — Other Ambulatory Visit: Payer: Self-pay | Admitting: Physician Assistant

## 2012-01-10 ENCOUNTER — Telehealth: Payer: Self-pay | Admitting: Cardiology

## 2012-01-10 NOTE — Telephone Encounter (Signed)
Pt calling re question on crestor rx

## 2012-01-10 NOTE — Telephone Encounter (Signed)
Pt has lost insurance and his new insurance doesn't pick up for 60 days.  Wanted samples of Crestor 10.  #28 available and left at front desk for pt.  Lot [id number]  Exp.  11/15.  He is aware and will come pick them up.

## 2012-02-08 ENCOUNTER — Telehealth: Payer: Self-pay | Admitting: Cardiology

## 2012-02-08 NOTE — Telephone Encounter (Signed)
Pt needs month supply of Crestor 10mg  samples to cover until his insurance starts. He will pick those up this afternoon. Katina Dung will leave samples at desk for pt to pick up today. Mylo Red RN

## 2012-02-08 NOTE — Telephone Encounter (Signed)
Spoke with pt. He is aware samples of Crestor 10mg  #28  have been left at desk for pick up.

## 2012-02-08 NOTE — Telephone Encounter (Signed)
New Problem:    Patient called wanting to speak with you in regards to his rosuvastatin (CRESTOR) 10 MG tablet.  Please call back.

## 2012-02-08 NOTE — Telephone Encounter (Signed)
lmtcb Debbie Kearston Putman RN  

## 2012-02-15 ENCOUNTER — Other Ambulatory Visit: Payer: Self-pay | Admitting: Physician Assistant

## 2012-03-11 ENCOUNTER — Other Ambulatory Visit: Payer: Self-pay | Admitting: Cardiology

## 2012-03-11 MED ORDER — ROSUVASTATIN CALCIUM 10 MG PO TABS: 10.0000 mg | ORAL_TABLET | Freq: Every day | ORAL | Status: DC

## 2012-03-11 MED ORDER — METOPROLOL TARTRATE 50 MG PO TABS: 50.0000 mg | ORAL_TABLET | Freq: Two times a day (BID) | ORAL | Status: DC

## 2012-03-12 ENCOUNTER — Other Ambulatory Visit: Payer: Self-pay | Admitting: Physician Assistant

## 2012-03-12 ENCOUNTER — Telehealth: Payer: Self-pay | Admitting: Cardiology

## 2012-03-12 NOTE — Telephone Encounter (Signed)
New msg Pt wants to know if he can get samples of crestor. Please let him know

## 2012-03-12 NOTE — Telephone Encounter (Signed)
Patient called left message on personal voice mail crestor 10 mg samples left at 3rd floor front desk.

## 2012-03-19 ENCOUNTER — Other Ambulatory Visit: Payer: Self-pay | Admitting: Cardiology

## 2012-03-19 MED ORDER — ENALAPRIL MALEATE 5 MG PO TABS: 5.0000 mg | ORAL_TABLET | Freq: Every day | ORAL | Status: DC

## 2012-03-20 ENCOUNTER — Telehealth: Payer: Self-pay | Admitting: Cardiology

## 2012-03-26 ENCOUNTER — Telehealth: Payer: Self-pay | Admitting: Cardiology

## 2012-03-26 MED ORDER — AMOXICILLIN 500 MG PO CAPS: ORAL_CAPSULE | ORAL | Status: DC

## 2012-03-26 NOTE — Telephone Encounter (Signed)
Left msg, needs abx and I called into [facility]. Asked him to call back with questions or concerns.

## 2012-03-26 NOTE — Telephone Encounter (Signed)
New Problem:    Patient called in because he is going to have some dental work done and his dentist wanted to know, due to him having open heart surgery last December, if he needed antibiotics before his visit on 03/27/12.  Please call back.

## 2012-04-04 ENCOUNTER — Telehealth: Payer: Self-pay | Admitting: *Deleted

## 2012-04-04 DIAGNOSIS — I25810 Atherosclerosis of coronary artery bypass graft(s) without angina pectoris: Secondary | ICD-10-CM

## 2012-04-04 MED ORDER — ATORVASTATIN CALCIUM 20 MG PO TABS: 20.0000 mg | ORAL_TABLET | Freq: Every day | ORAL | Status: DC

## 2012-04-04 NOTE — Telephone Encounter (Signed)
Prior authorization still not working for [employer]. I talked with pt and he has not been on any other cholesterol medication. He is will to start Atorvastatin 20mg  as per Dr. Daleen Squibb recommendation in telephone note from 1/

## 2012-04-04 NOTE — Telephone Encounter (Signed)
From 08/28/11.  Will have fasting lab work drawn last week of October. Will mail pt reminder of this lab date. Order sent to [facility] & lab scheduled Mylo Red RN

## 2012-04-18 ENCOUNTER — Telehealth: Payer: Self-pay | Admitting: Cardiology

## 2012-04-18 NOTE — Telephone Encounter (Signed)
Pt calls today b/c he has experienced intermittent pain in his left side around back & hand. "When I deep I can feel it" He doesn't know if he pulled a muscle or not. He has been pressure washing the house and leaned quite a bit on the ladder with his left side. He also recently lifted an end table and threw it in the trash. He has taken Aleve and this has helped.  He will reduce strenuous exercise Mylo Red RN

## 2012-04-18 NOTE — Telephone Encounter (Signed)
plz return call to patient [phone], pt had chest pain over the last 3 days,

## 2012-05-27 ENCOUNTER — Other Ambulatory Visit (INDEPENDENT_AMBULATORY_CARE_PROVIDER_SITE_OTHER): Payer: BC Managed Care – PPO

## 2012-05-27 DIAGNOSIS — I25810 Atherosclerosis of coronary artery bypass graft(s) without angina pectoris: Secondary | ICD-10-CM

## 2012-05-27 LAB — LIPID PANEL
Cholesterol: 136 mg/dL (ref 0–200)
HDL: 29.2 mg/dL — ABNORMAL LOW (ref >39.00)
LDL Cholesterol: 79 mg/dL (ref 0–99)
Total CHOL/HDL Ratio: 5
Triglycerides: 139 mg/dL (ref 0.0–149.0)
VLDL: 27.8 mg/dL (ref 0.0–40.0)

## 2012-05-27 LAB — HEPATIC FUNCTION PANEL
ALT: 43 U/L (ref 0–53)
AST: 40 U/L — ABNORMAL HIGH (ref 0–37)
Albumin: 3.7 g/dL (ref 3.5–5.2)
Alkaline Phosphatase: 42 U/L (ref 39–117)
Bilirubin, Direct: 0 mg/dL (ref 0.0–0.3)
Total Bilirubin: 0.7 mg/dL (ref 0.3–1.2)
Total Protein: 6.9 g/dL (ref 6.0–8.3)

## 2012-06-26 ENCOUNTER — Telehealth: Payer: Self-pay | Admitting: Cardiology

## 2012-06-26 NOTE — Telephone Encounter (Signed)
Pt calling re getting injections for his psoriasis, dr dralos in [place], if ok to continue needs letter,  mail to pt

## 2012-06-26 NOTE — Telephone Encounter (Signed)
Patient called no answer.Left message on personal voice mail Dr.Wall not in office will forward message to Dr.Wall's nurse.

## 2012-07-03 ENCOUNTER — Encounter: Payer: Self-pay | Admitting: *Deleted

## 2012-07-03 NOTE — Telephone Encounter (Signed)
I spoke with pt and his doctor is going to try topical treatment at this time."They are doing a different study this time." He is aware that their office can call or we fax clearance letter. One has already been prepared in computer. Mylo Red RN

## 2012-08-01 ENCOUNTER — Emergency Department (HOSPITAL_COMMUNITY)
Admission: EM | Admit: 2012-08-01 | Discharge: 2012-08-01 | Disposition: A | Discharge status: Home / Self Care | Payer: BC Managed Care – PPO | Attending: Emergency Medicine | Admitting: Emergency Medicine

## 2012-08-01 ENCOUNTER — Encounter (HOSPITAL_COMMUNITY): Payer: Self-pay | Admitting: Emergency Medicine

## 2012-08-01 DIAGNOSIS — I739 Peripheral vascular disease, unspecified: Secondary | ICD-10-CM | POA: Insufficient documentation

## 2012-08-01 DIAGNOSIS — F3289 Other specified depressive episodes: Secondary | ICD-10-CM | POA: Insufficient documentation

## 2012-08-01 DIAGNOSIS — I359 Nonrheumatic aortic valve disorder, unspecified: Secondary | ICD-10-CM | POA: Insufficient documentation

## 2012-08-01 DIAGNOSIS — G8929 Other chronic pain: Secondary | ICD-10-CM | POA: Insufficient documentation

## 2012-08-01 DIAGNOSIS — Z7982 Long term (current) use of aspirin: Secondary | ICD-10-CM | POA: Insufficient documentation

## 2012-08-01 DIAGNOSIS — I10 Essential (primary) hypertension: Secondary | ICD-10-CM | POA: Insufficient documentation

## 2012-08-01 DIAGNOSIS — E785 Hyperlipidemia, unspecified: Secondary | ICD-10-CM | POA: Insufficient documentation

## 2012-08-01 DIAGNOSIS — M62830 Muscle spasm of back: Secondary | ICD-10-CM

## 2012-08-01 DIAGNOSIS — M129 Arthropathy, unspecified: Secondary | ICD-10-CM | POA: Insufficient documentation

## 2012-08-01 DIAGNOSIS — M549 Dorsalgia, unspecified: Secondary | ICD-10-CM | POA: Insufficient documentation

## 2012-08-01 DIAGNOSIS — Z79899 Other long term (current) drug therapy: Secondary | ICD-10-CM | POA: Insufficient documentation

## 2012-08-01 DIAGNOSIS — Z791 Long term (current) use of non-steroidal anti-inflammatories (NSAID): Secondary | ICD-10-CM | POA: Insufficient documentation

## 2012-08-01 DIAGNOSIS — I2510 Atherosclerotic heart disease of native coronary artery without angina pectoris: Secondary | ICD-10-CM | POA: Insufficient documentation

## 2012-08-01 DIAGNOSIS — K219 Gastro-esophageal reflux disease without esophagitis: Secondary | ICD-10-CM | POA: Insufficient documentation

## 2012-08-01 DIAGNOSIS — F329 Major depressive disorder, single episode, unspecified: Secondary | ICD-10-CM | POA: Insufficient documentation

## 2012-08-01 MED ORDER — KETOROLAC TROMETHAMINE 60 MG/2ML IM SOLN
60.0000 mg | Freq: Once | INTRAMUSCULAR | Status: AC
  Administered 2012-08-01: 60 mg via INTRAMUSCULAR
  Filled 2012-08-01: qty 2

## 2012-08-01 MED ORDER — METHOCARBAMOL 500 MG PO TABS: 500.0000 mg | ORAL_TABLET | Freq: Two times a day (BID) | ORAL | Status: DC

## 2012-08-01 MED ORDER — NAPROXEN 500 MG PO TABS: 500.0000 mg | ORAL_TABLET | Freq: Two times a day (BID) | ORAL | Status: DC

## 2012-08-01 MED ORDER — HYDROCODONE-ACETAMINOPHEN 5-325 MG PO TABS: 1.0000 | ORAL_TABLET | ORAL | Status: DC | PRN

## 2012-08-01 NOTE — ED Notes (Signed)
States that he has back pain and is usually able to resolve the pain with stretching, exercise, or medication. States that he is relieved some with hot showers however not completely relieved. He was told that the vertebrae is pinching on a nerve. States that he believes that he is also having sciatica

## 2012-08-01 NOTE — ED Provider Notes (Signed)
Medical screening examination/treatment/procedure(s) were performed by non-physician practitioner and as supervising physician I was immediately available for consultation/collaboration.  Martha K Linker, MD 08/01/12 1528 

## 2012-08-01 NOTE — ED Provider Notes (Signed)
History     CSN: [id number]  Arrival date & time 08/01/12  4782   First MD Initiated Contact with Patient 08/01/12 [phone]      Chief Complaint  Patient presents with  . Back Pain    (Consider location/radiation/quality/duration/timing/severity/associated sxs/prior treatment) HPI Comments: This is a 62 year old male, who presents emergency department with chief complaint of back pain. Patient states that the pain has been ongoing for the past several days, but it has worsened recently. Patient states that he has chronic back pain, which she normally manages this with stretching, exercises, and ibuprofen. States that these measures have not worked. His pain is moderate patient states his pain is persistent.  The history is provided by the patient. No language interpreter was used.    Past Medical History  Diagnosis Date  . Hypertension   . Hyperlipidemia   . Depression   . PVD (peripheral vascular disease)   . Femoral-popliteal bypass graft occlusion, right 1997    Dr. Hart Rochester  . Claudication in peripheral vascular disease   . Aortic stenosis 2009    ef 40-45%   . Aortic stenosis   . Aortic valve disorder   . Coronary artery disease   . Aneurysm of ascending aorta   . Shortness of breath   . GERD (gastroesophageal reflux disease)   . Headache   . Arthritis   . PONV (postoperative nausea and vomiting)     Past Surgical History  Procedure Date  . Rotator cuff repair     Left  . Cardiac catheterization 2009, 07/11/11    Dr. Eldridge Dace  . US echocardiography   . Knee arthroscopy     left  . Mri     to visualize aortic valve  . Femoral artery - popliteal artery bypass graft   . Aortic valve replacement 07/18/2011    Procedure: AORTIC VALVE REPLACEMENT (AVR);  Surgeon: Delight Ovens, MD;  Location: [facility] OR;  Service: Open Heart Surgery;  Laterality: N/A;  . Coronary artery bypass graft 07/18/2011    Procedure: CORONARY ARTERY BYPASS GRAFTING (CABG);  Surgeon: Delight Ovens, MD;  Location[facility] OR;  Service: Open Heart Surgery;  Laterality: N/A;  times one to mammary artery, left    No family history on file.  History  Substance Use Topics  . Smoking status: Never Smoker   . Smokeless tobacco: Not on file  . Alcohol Use: No      Review of Systems  All other systems reviewed and are negative.    Allergies  Codeine and Morphine and related  Home Medications   Current Outpatient Rx  Name  Route  Sig  Dispense  Refill  . AMOXICILLIN 500 MG PO CAPS   Oral   Take 2,000 mg by mouth as directed. Take 4 - 500 mg tablets one hour before dental procedure,         . ASPIRIN EC 81 MG PO TBEC   Oral   Take 81 mg by mouth every morning.         . ATORVASTATIN CALCIUM 20 MG PO TABS   Oral   Take 20 mg by mouth at bedtime.         [name] CITALOPRAM HYDROBROMIDE 40 MG PO TABS   Oral   Take 40 mg by mouth every evening.          . ENALAPRIL MALEATE 5 MG PO TABS   Oral   Take 5 mg by mouth every evening.         [name]  LANSOPRAZOLE 15 MG PO CPDR   Oral   Take 15 mg by mouth daily as needed. For acid reflux         . METOPROLOL TARTRATE 50 MG PO TABS   Oral   Take 1 tablet (50 mg total) by mouth 2 (two) times daily.   60 tablet   5   . NAPROXEN SODIUM 220 MG PO TABS   Oral   Take 220 mg by mouth 2 (two) times daily as needed. For pain         . HYDROCODONE-ACETAMINOPHEN 5-325 MG PO TABS   Oral   Take 1 tablet by mouth every 4 (four) hours as needed for pain.   6 tablet   0   . METHOCARBAMOL 500 MG PO TABS   Oral   Take 1 tablet (500 mg total) by mouth 2 (two) times daily.   20 tablet   0   . NAPROXEN 500 MG PO TABS   Oral   Take 1 tablet (500 mg total) by mouth 2 (two) times daily with a meal.   30 tablet   0     BP 140/109  Pulse 62  Temp 98.1 F (36.7 C) (Oral)  Resp 16  SpO2 100%  Physical Exam  Nursing note and vitals reviewed. Constitutional: He is oriented to person, place, and time. He appears  well-developed and well-nourished.  HENT:  Head: Normocephalic and atraumatic.  Eyes: Conjunctivae normal and EOM are normal.  Neck: Normal range of motion.  Cardiovascular: Normal rate.   Pulmonary/Chest: Effort normal.  Abdominal: He exhibits no distension.  Musculoskeletal: Normal range of motion. He exhibits tenderness. He exhibits no edema.       Lumbar paraspinal muscles tender to palpation, no bony tenderness in the spine. Lower extremity range of motion is full, strength is 5 out of 5, sensation intact bilaterally.  Neurological: He is alert and oriented to person, place, and time.  Skin: Skin is dry.  Psychiatric: He has a normal mood and affect. His behavior is normal. Judgment and thought content normal.    ED Course  Procedures (including critical care time)      1. Back spasm   2. Back pain       MDM  62 year old male with back pain. Will treat with norco, naproxen and Robaxin. Patient understands and agrees with the plan. He is stable and ready for discharge.        Roxy Horseman, PA-C 08/01/12 1527

## 2012-08-28 IMAGING — CT CT ANGIO CHEST
2 of 6 series · 7 of 30 positions shown · IV contrast ([ID] [id number])
Comparison: Chest CTA 07/06/2008.

CLINICAL DATA: Follow-up aortic dilatation.  Occasional shortness
of breath.

CT ANGIOGRAPHY CHEST WITH CONTRAST
TECHNIQUE: Multidetector CT imaging of the chest was performed
using the standard protocol during bolus administration of
intravenous contrast.  Multiplanar CT image reconstructions
including MIPs were obtained to evaluate the vascular anatomy.
Contrast: 100mL OMNIPAQUE IOHEXOL 300 MG/ML IV SOLN

[Series 5: angio · axial · 0.84mm/px · z∈[-163,-76]mm · 2 of 105 slices shown]
[im 35/105  lung]
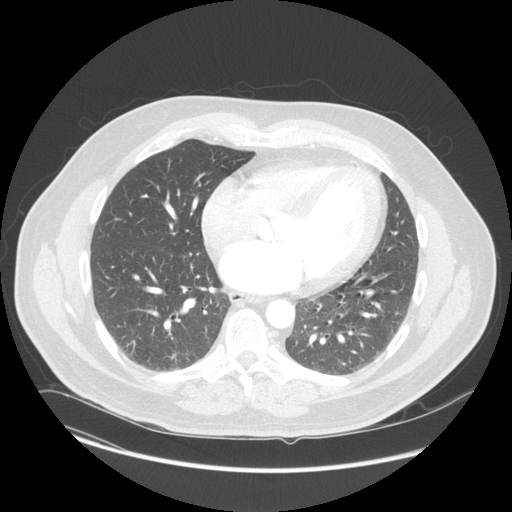
[im 70/105  mediastinal]
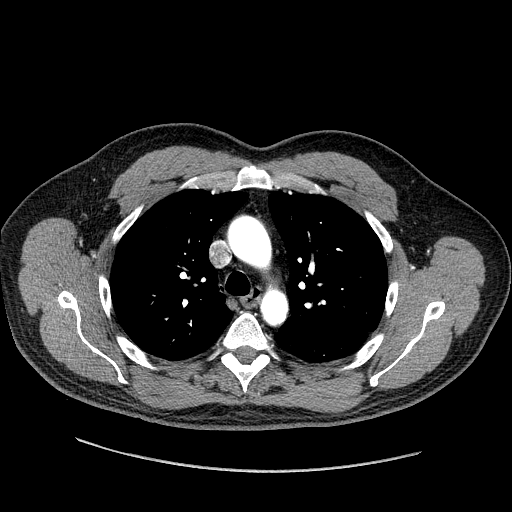

[Series 602: sagittal body · sagittal · 0.84mm/px · 5 of 172 slices shown]
[im 29/172  lung]
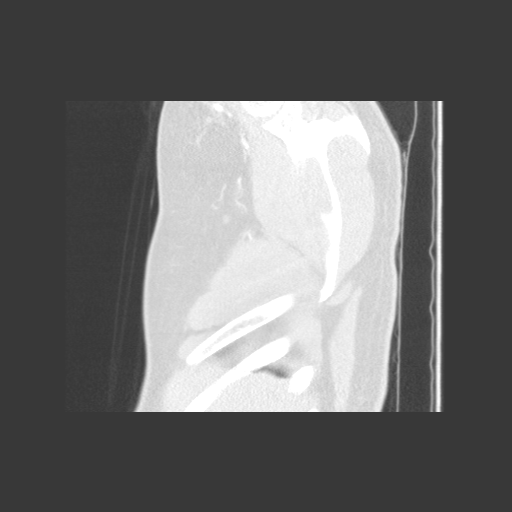
[im 58/172  lung]
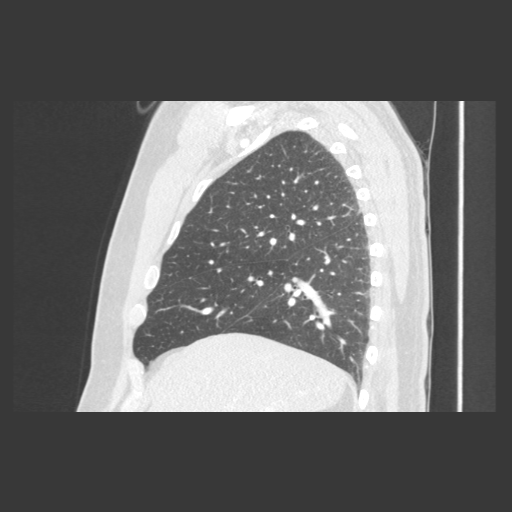
[im 86/172  lung]
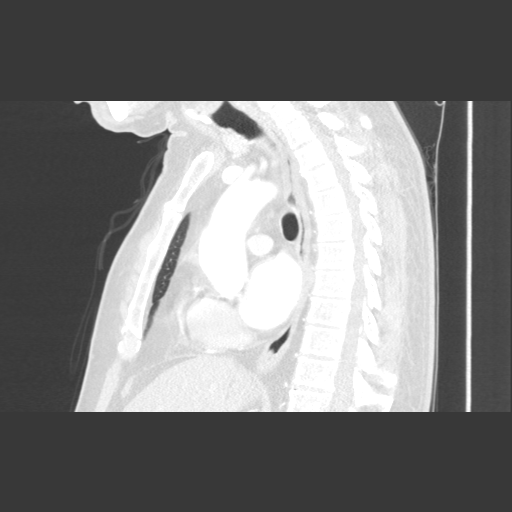
[im 115/172  lung]
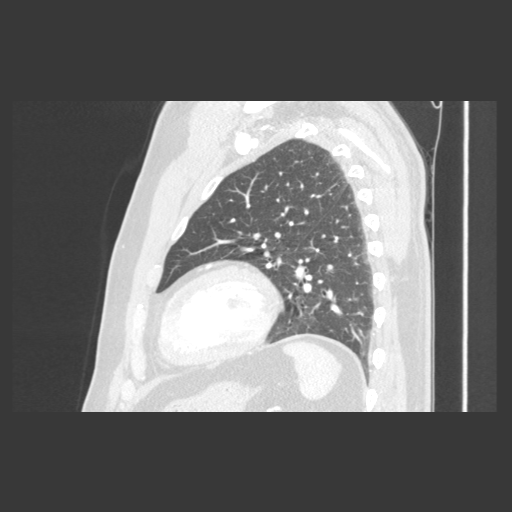
[im 143/172  lung]
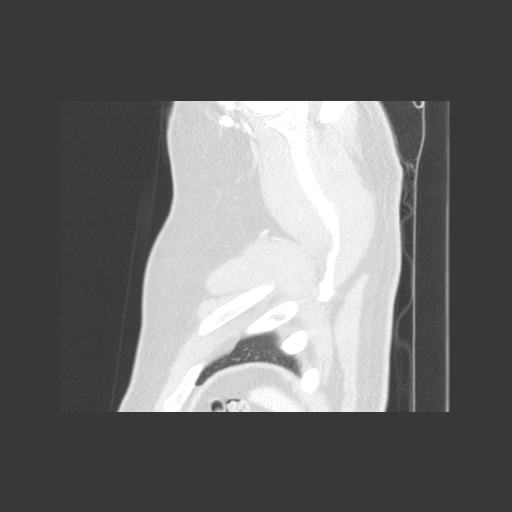

[7 of 30 positions shown; findings below may reference images not displayed]

FINDINGS: There is stable mild fusiform dilatation of the
ascending aorta.  Maximal diameter is 4.1 cm.  There is no focal
aneurysm or dissection.  There is focal calcified plaque in the
left subclavian artery near the origin of the left vertebral
artery.  Diffuse coronary artery calcifications are present.

The pulmonary arteries are not optimally opacified by this
technique but appear normal.

There are no enlarged mediastinal or hilar lymph nodes.  There is
no pleural or pericardial effusion.  A small hiatal hernia appears
stable.

The lungs are now clear.  There are no suspicious nodules.

Images of the upper abdomen demonstrate hepatic steatosis and a
stable 1.4 cm lesion projecting from the upper pole of the left
kidney.  Although this measures higher than water attenuation, it
appears stable and is likely a small cyst.

Review of the MIP images confirms the above findings.
IMPRESSION: 1.  Stable mild ectasia of the ascending aorta with a maximal
diameter 4.1 cm.  There is no focal aneurysm or dissection.
2.  Focal calcified plaque near the origin of the left vertebral
artery.  No large vessel occlusion identified.
3.  Diffuse coronary artery calcifications.
4.  Interval resolution of pulmonary nodularity.

## 2012-08-29 ENCOUNTER — Telehealth: Payer: Self-pay | Admitting: Cardiology

## 2012-08-29 NOTE — Telephone Encounter (Signed)
Called patient back. Complaining of decreased libido. Wants to know if Dr.Wall could give some PRN medication to help with this. Advised will send him a message and call back in follow up.

## 2012-08-29 NOTE — Telephone Encounter (Signed)
Pt calling re heart surgery he had last year and what he might to or take to increase libido ,  uses [facility]

## 2012-08-29 NOTE — Telephone Encounter (Signed)
LMTCB ./CY 

## 2012-09-01 IMAGING — CR DG CHEST 1V PORT
1 series · 1 of 1 positions shown · non-contrast
Comparison: 07/14/2011

CLINICAL DATA: Status post aortic valve replacement surgery.

PORTABLE CHEST - 1 VIEW

[view not recorded]
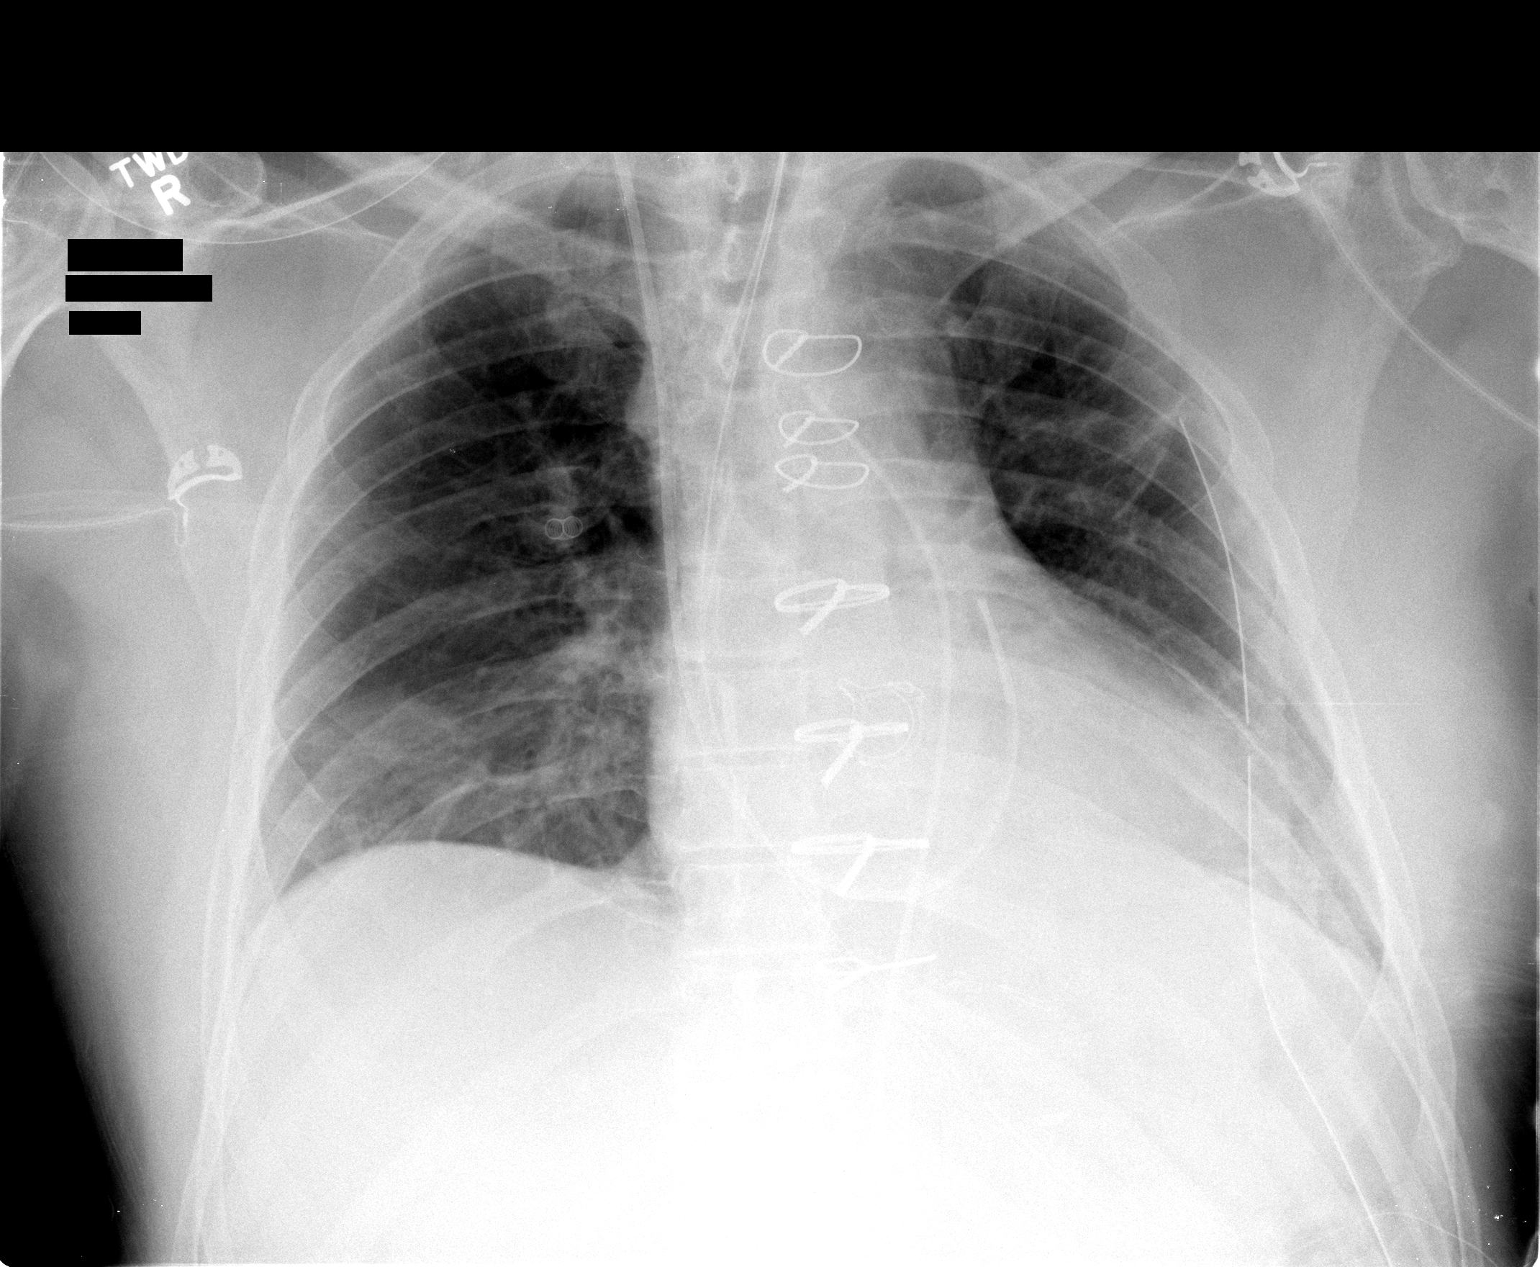

[1 of 1 positions shown; findings below may reference images not displayed]

FINDINGS: Endotracheal tube present with tip approximately 3 cm
above the carina.  Swan-Ganz catheter extends into the main
pulmonary outflow tract.  Additional catheter in the midline
represents either aortic balloon pump or mediastinal drain.  Left
chest tube present.  Prosthetic aortic valve visualized.
Nasogastric tube extends into the stomach.

No evidence of pneumothorax or pulmonary edema.  Bilateral lower
lobe atelectasis present, left greater than right.  No significant
pleural effusions.  Heart size and mediastinal contours are
unremarkable.
IMPRESSION: Bilateral lower lobe atelectasis, left greater than right.  No
pneumothorax.

## 2012-09-03 NOTE — Telephone Encounter (Signed)
Cialis ok if no angina and BP under good control

## 2012-09-04 NOTE — Telephone Encounter (Signed)
LMTCB Debbie Camila Maita RN  

## 2012-09-04 NOTE — Telephone Encounter (Signed)
I spoke with pt today about medication request.  He denies any angina or chest pain. States his blood pressure has been under good control "it it the best it ever has been".  Pt is aware Dr. Daleen Squibb recommended Cialis if BP under good control and no anginal episodes.  Pt will contact his pcp for a prescription of Cialis. I will fax a copy of this telephone transmission to his pcp  Mylo Red RN

## 2012-09-17 ENCOUNTER — Telehealth: Payer: Self-pay | Admitting: Cardiology

## 2012-09-17 MED ORDER — ATORVASTATIN CALCIUM 20 MG PO TABS: 20.0000 mg | ORAL_TABLET | Freq: Every day | ORAL | Status: DC

## 2012-09-17 MED ORDER — METOPROLOL TARTRATE 50 MG PO TABS: 50.0000 mg | ORAL_TABLET | Freq: Two times a day (BID) | ORAL | Status: DC

## 2012-09-17 MED ORDER — ENALAPRIL MALEATE 5 MG PO TABS: 5.0000 mg | ORAL_TABLET | Freq: Every evening | ORAL | Status: DC

## 2012-09-17 NOTE — Telephone Encounter (Signed)
3 meds refilled. Reviewed last ov/ return in 1 year.

## 2012-09-17 NOTE — Telephone Encounter (Signed)
New problem    [facility]   Refill on all medication

## 2012-09-22 IMAGING — CR DG CHEST 2V
2 series · 2 of 2 positions shown · non-contrast
Comparison: Portable chest x-ray of 07/20/2011

CLINICAL DATA: Post CABG and valve replacement, follow-up

CHEST - 2 VIEW

[view not recorded (1 of 2)]
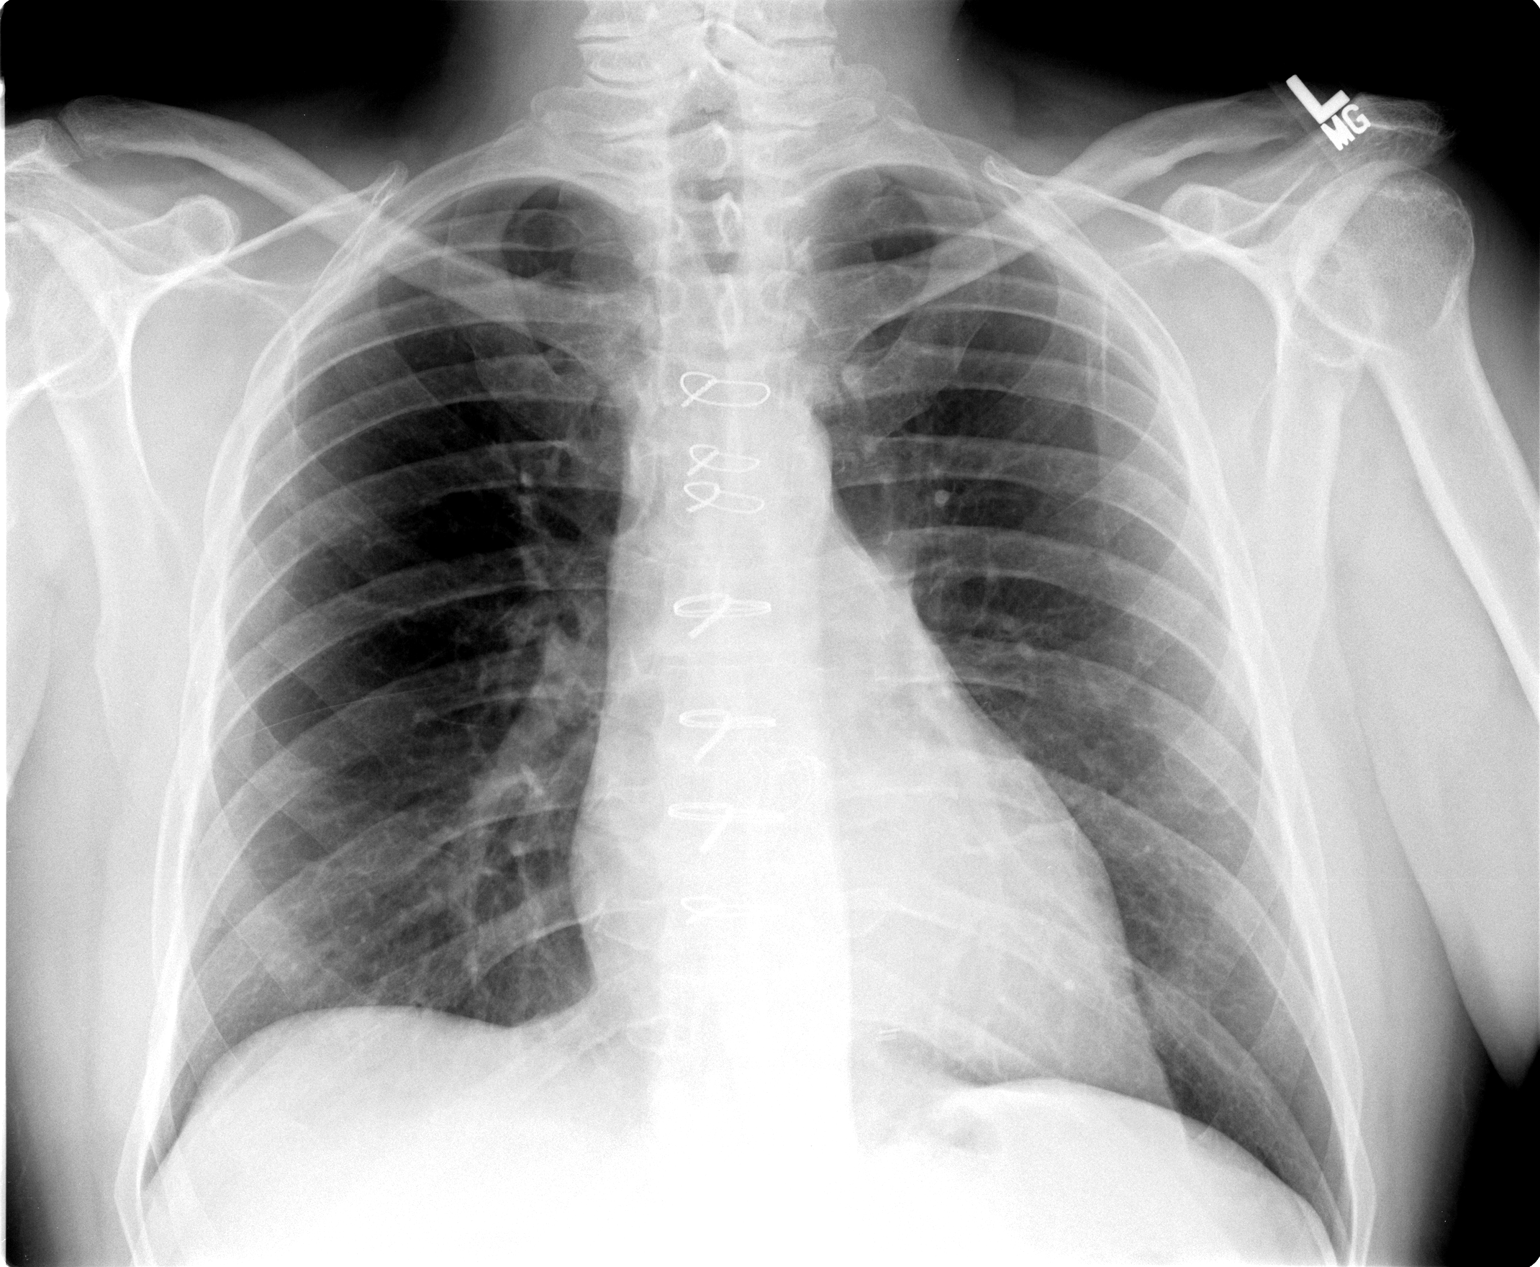

[view not recorded (2 of 2)]
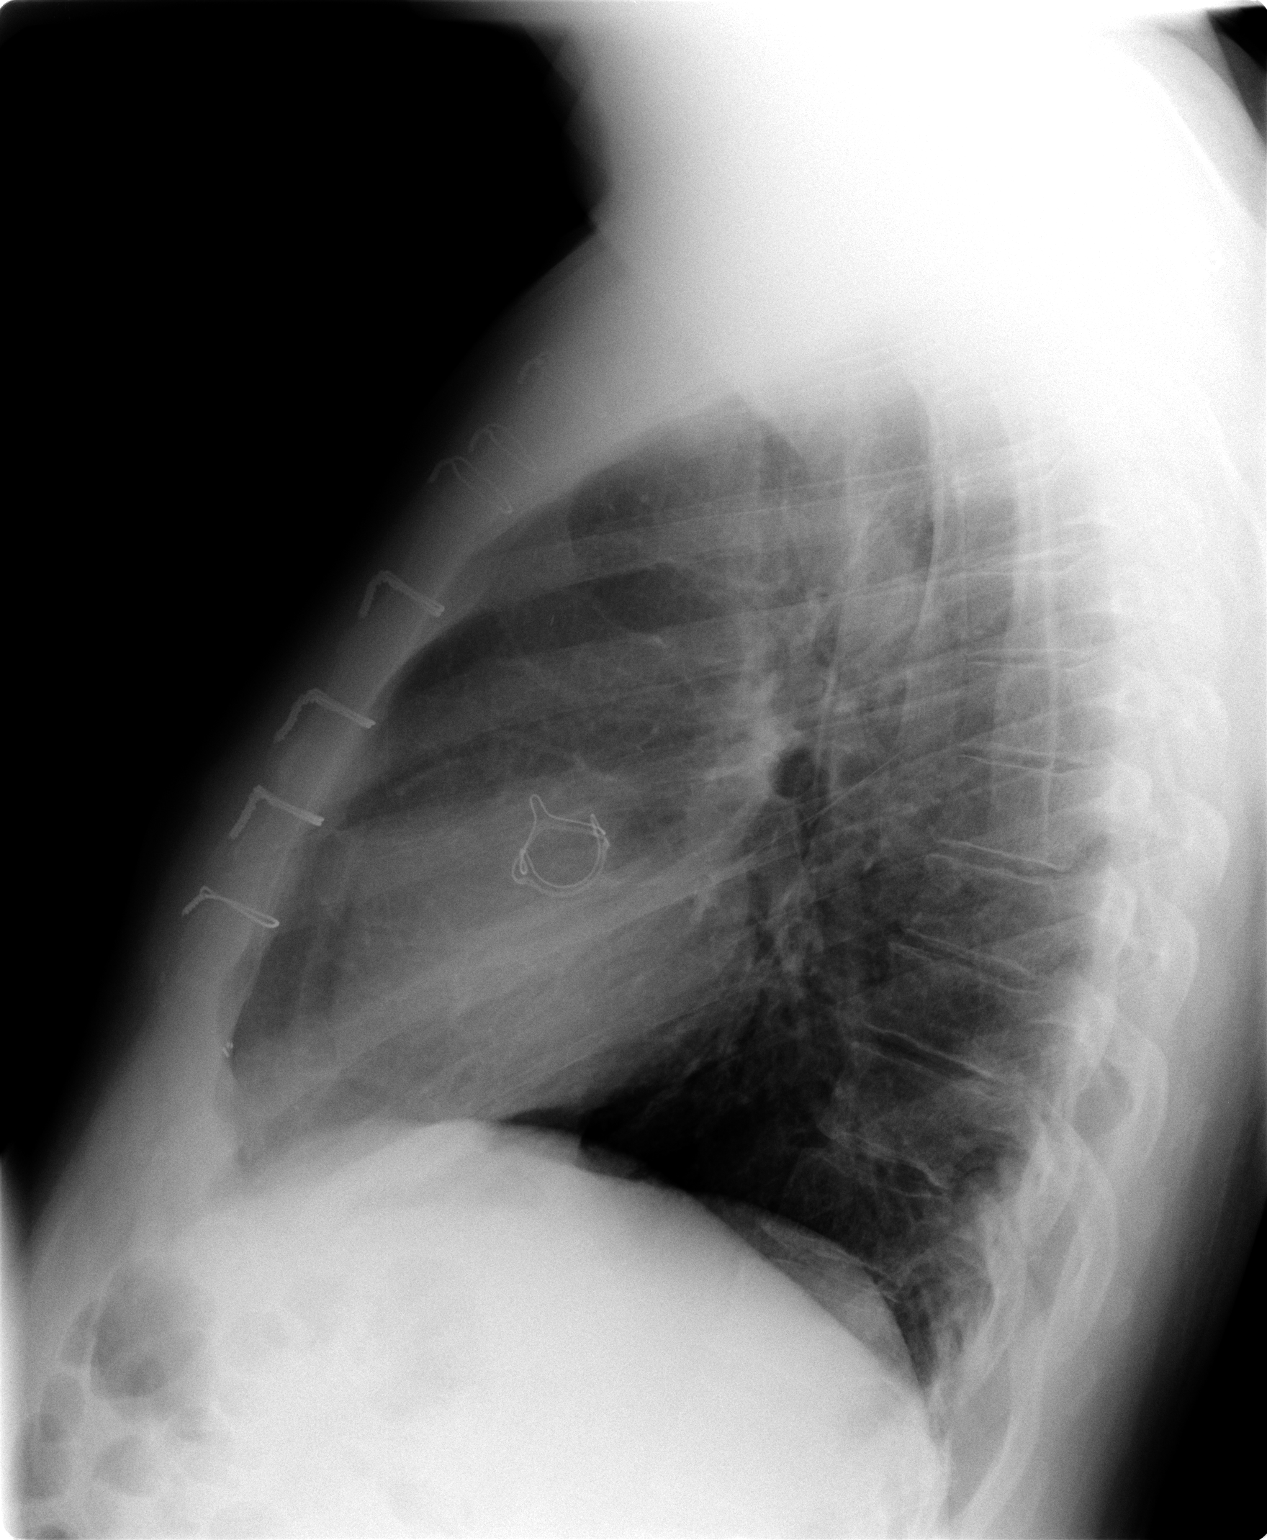

[2 of 2 positions shown; findings below may reference images not displayed]

FINDINGS: Aeration has improved significantly.  The effusions have
resolved as has the basilar atelectasis.  An aortic valve
replacement is noted.  Mild cardiomegaly is stable.  Median
sternotomy sutures are noted.  No bony abnormality is seen.
IMPRESSION: No active lung disease.  Post AVR.

## 2012-09-23 ENCOUNTER — Telehealth: Payer: Self-pay | Admitting: Cardiology

## 2012-09-23 NOTE — Telephone Encounter (Signed)
LMTCB ./CY 

## 2012-09-23 NOTE — Telephone Encounter (Signed)
New Prob    Pt is following up prescription. Has not yet been called in. Would like to speak to nurse.

## 2012-09-23 NOTE — Telephone Encounter (Signed)
SPOKE WITH DR MANNING'S NURSE  RE MESSAGE   DR MANNING'S NURSE  TO SEND MESSAGE  TO DR MANNING  FOR  OKAY FOR PRESCRIPTION .[name]

## 2012-11-19 ENCOUNTER — Ambulatory Visit: Payer: BC Managed Care – PPO | Admitting: Cardiology

## 2013-01-29 ENCOUNTER — Encounter: Payer: Self-pay | Admitting: Cardiology

## 2013-01-29 ENCOUNTER — Ambulatory Visit (INDEPENDENT_AMBULATORY_CARE_PROVIDER_SITE_OTHER): Payer: BC Managed Care – PPO | Admitting: Cardiology

## 2013-01-29 VITALS — BP 122/76 | HR 60 | Ht 72.0 in | Wt 224.0 lb

## 2013-01-29 DIAGNOSIS — I2510 Atherosclerotic heart disease of native coronary artery without angina pectoris: Secondary | ICD-10-CM

## 2013-01-29 DIAGNOSIS — I359 Nonrheumatic aortic valve disorder, unspecified: Secondary | ICD-10-CM

## 2013-01-29 DIAGNOSIS — R0989 Other specified symptoms and signs involving the circulatory and respiratory systems: Secondary | ICD-10-CM

## 2013-01-29 DIAGNOSIS — I739 Peripheral vascular disease, unspecified: Secondary | ICD-10-CM

## 2013-01-29 NOTE — Assessment & Plan Note (Signed)
Stable. Continue secondary preventative therapy. Check carotid Dopplers.

## 2013-01-29 NOTE — Assessment & Plan Note (Signed)
Stable. Continue secondary preventative therapy. 

## 2013-01-29 NOTE — Assessment & Plan Note (Signed)
Stable status post aortic valve replacement. No indication for echocardiography

## 2013-01-29 NOTE — Addendum Note (Signed)
Addended by: Lisabeth Devoid F on: 01/29/2013 05:04 PM   Modules accepted: Orders

## 2013-01-29 NOTE — Patient Instructions (Addendum)
Your physician has requested that you have a carotid duplex. This test is an ultrasound of the carotid arteries in your neck. It looks at blood flow through these arteries that supply the brain with blood. Allow one hour for this exam. There are no restrictions or special instructions.  Your physician recommends that you continue on your current medications as directed. Please refer to the Current Medication list given to you today.  Your physician wants you to follow-up in: 1 year with Dr. Excell Seltzer. You will receive a reminder letter in the mail two months in advance. If you don't receive a letter, please call our office to schedule the follow-up appointment.

## 2013-01-29 NOTE — Progress Notes (Signed)
HPI [name] comes in today for evaluation and management history of coronary artery disease, status post coronary bypass grafting, history of severe aortic stenosis status post aortic valve replacement, history of peripheral vascular disease status post femoropopliteal bypass.  He is doing well without any chest discomfort other than chest Magdalyn Arenivas pain from his incision. This is chronic. He denies any angina or ischemic symptoms. He denies any palpitations, shortness of breath, significant dyspnea on exertion, presyncope or syncope, or edema. He's really having no claudication. I cannot find preop carotid Dopplers.  Past Medical History  Diagnosis Date  . Hypertension   . Hyperlipidemia   . Depression   . PVD (peripheral vascular disease)   . Femoral-popliteal bypass graft occlusion, right 1997    Dr. Hart [place]  . Claudication in peripheral vascular disease   . Aortic stenosis 2009    ef 40-45%   . Aortic stenosis   . Aortic valve disorder   . Coronary artery disease   . Aneurysm of ascending aorta   . Shortness of breath   . GERD (gastroesophageal reflux disease)   . Headache(784.0)   . Arthritis   . PONV (postoperative nausea and vomiting)     Current Outpatient Prescriptions  Medication Sig Dispense Refill  . amoxicillin (AMOXIL) 500 MG capsule Take 2,000 mg by mouth as directed. Take 4 - 500 mg tablets one hour before dental procedure,      . aspirin EC 81 MG tablet Take 81 mg by mouth every morning.      [name] atorvastatin (LIPITOR) 20 MG tablet Take 1 tablet (20 mg total) by mouth at bedtime.  90 tablet  1  . citalopram (CELEXA) 40 MG tablet Take 40 mg by mouth every evening.       . enalapril (VASOTEC) 5 MG tablet Take 1 tablet (5 mg total) by mouth every evening.  90 tablet  1  . HYDROcodone-acetaminophen (NORCO/VICODIN) 5-325 MG per tablet Take 1 tablet by mouth every 4 (four) hours as needed for pain.  6 tablet  0  . lansoprazole (PREVACID) 15 MG capsule Take 15 mg by mouth  daily as needed. For acid reflux      . metoprolol (LOPRESSOR) 50 MG tablet Take 1 tablet (50 mg total) by mouth 2 (two) times daily.  180 tablet  1   No current facility-administered medications for this visit.    Allergies  Allergen Reactions  . Codeine Other (See Comments)    Blood drops  . Morphine And Related Itching    No family history on file.  History   Social History  . Marital Status: Married    Spouse Name: N/A    Number of Children: N/A  . Years of Education: N/A   Occupational History  . [employer]    Social History Main Topics  . Smoking status: Never Smoker   . Smokeless tobacco: Not on file  . Alcohol Use: No  . Drug Use: No  . Sexually Active:    Other Topics Concern  . Not on file   Social History Narrative  . No narrative on file    ROS ALL NEGATIVE EXCEPT THOSE NOTED IN HPI  PE  General Appearance: well developed, well nourished in no acute distress, overweight HEENT: symmetrical face, PERRLA, good dentition  Neck: no JVD, thyromegaly, or adenopathy, trachea midline Chest: symmetric without deformity Cardiac: PMI non-displaced, RRR, normal S1, S2, no gallop or murmur Lung: clear to ausculation and percussion Vascular: Reduced pulses in lower  extremities. Abdominal: nondistended, nontender, good bowel sounds, no HSM, no bruits Extremities: no cyanosis, clubbing or edema, no sign of DVT, no varicosities  Skin: normal color, no rashes Neuro: alert and oriented x 3, non-focal Pysch: normal affect  EKG Normal sinus rhythm, ST segment changes inferior laterally. No acute changes. BMET    Component Value Date/Time   NA 132* 07/24/2011 0610   K 3.7 07/24/2011 0610   CL 96 07/24/2011 0610   CO2 28 07/24/2011 0610   GLUCOSE 91 07/24/2011 0610   BUN 14 07/24/2011 0610   CREATININE 0.84 07/24/2011 0610   CALCIUM 8.8 07/24/2011 0610   GFRNONAA >90 07/24/2011 0610   GFRAA >90 07/24/2011 0610    Lipid Panel     Component Value  Date/Time   CHOL 136 05/27/2012 0921   TRIG 139.0 05/27/2012 0921   HDL 29.20* 05/27/2012 0921   CHOLHDL 5 05/27/2012 0921   VLDL 27.8 05/27/2012 0921   LDLCALC 79 05/27/2012 0921    CBC    Component Value Date/Time   WBC 10.6* 07/24/2011 0610   RBC 3.41* 07/24/2011 0610   HGB 10.3* 07/24/2011 0610   HCT 30.7* 07/24/2011 0610   PLT 236 07/24/2011 0610   MCV 90.0 07/24/2011 0610   MCH 30.2 07/24/2011 0610   MCHC 33.6 07/24/2011 0610   RDW 13.4 07/24/2011 0610   LYMPHSABS 2.8 07/06/2011 1452   MONOABS 0.7 07/06/2011 1452   EOSABS 0.1 07/06/2011 1452   BASOSABS 0.0 07/06/2011 1452

## 2013-01-29 NOTE — Addendum Note (Signed)
Addended by: Lisabeth Devoid F on: 01/29/2013 04:03 PM   Modules accepted: Orders

## 2013-01-30 ENCOUNTER — Encounter (INDEPENDENT_AMBULATORY_CARE_PROVIDER_SITE_OTHER): Payer: BC Managed Care – PPO

## 2013-01-30 DIAGNOSIS — I6529 Occlusion and stenosis of unspecified carotid artery: Secondary | ICD-10-CM

## 2013-01-30 DIAGNOSIS — R42 Dizziness and giddiness: Secondary | ICD-10-CM

## 2013-01-30 DIAGNOSIS — R0989 Other specified symptoms and signs involving the circulatory and respiratory systems: Secondary | ICD-10-CM

## 2013-02-03 ENCOUNTER — Telehealth: Payer: Self-pay | Admitting: *Deleted

## 2013-02-03 NOTE — Telephone Encounter (Signed)
Pt called, he wanted to know from dr wall if he would be ok to take a cross fit class. Also the new med that dr wall gave him he does not think it is working correctly. Will forward message to dr wall's nurse, debbie gray

## 2013-02-03 NOTE — Telephone Encounter (Signed)
1. 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  2.  pt takes Cialis for ED & was given prescription ? By Dr. Daleen Squibb.  He wanted to make sure this was okay to take & would need 10 mg as the 5 mg "did not seem to be as effective." If this is okay he will have his pcp represcribe for him  I will forward this to Dr. Daleen Squibb for review. Mylo Red RN

## 2013-02-03 NOTE — Telephone Encounter (Signed)
lmtcb Debbie Saryiah Bencosme RN  

## 2013-04-14 ENCOUNTER — Other Ambulatory Visit: Payer: Self-pay | Admitting: *Deleted

## 2013-04-14 ENCOUNTER — Other Ambulatory Visit: Payer: Self-pay

## 2013-04-14 MED ORDER — METOPROLOL TARTRATE 50 MG PO TABS: 50.0000 mg | ORAL_TABLET | Freq: Two times a day (BID) | ORAL | Status: DC

## 2013-04-14 MED ORDER — ENALAPRIL MALEATE 5 MG PO TABS: 5.0000 mg | ORAL_TABLET | Freq: Every evening | ORAL | Status: DC

## 2013-05-16 ENCOUNTER — Other Ambulatory Visit: Payer: Self-pay | Admitting: Cardiology

## 2013-08-11 ENCOUNTER — Telehealth: Payer: Self-pay | Admitting: Cardiovascular Disease

## 2013-08-11 NOTE — Telephone Encounter (Signed)
I spoke with the pt and he saw his PCP today and his pulse was 52.  The PCP told him that his heart rate was to slow and that he should decrease Metoprolol Tartrate to 50mg  daily.  The pt said he has had a few episodes of fatigue and dizziness.  I advised the pt that he can decrease Metoprolol Tartrate to 50mg  take one-half tablet by mouth twice a day and monitor his BP and pulse.  BP has been within normal per pt. The pt will contact our office if his pulse remains below 55 or if he has any other symptoms of dizziness and we can arrange an office visit.  Pt agreed with plan.

## 2013-08-11 NOTE — Telephone Encounter (Signed)
New Message  Pt called is a previous WALL pt// Will see Dr. Burt Knack in July. He states his medications were changed with his Primary care Dr. Wilma Flavin with [facility].. He says that his Metoprolol dosage was cut back to 1 pill per day vs 2. He states the reason why was because his heart rate was too low. Is an appt needed or can he discuss with a  nurse over the phone.

## 2013-10-22 ENCOUNTER — Other Ambulatory Visit: Payer: Self-pay

## 2013-10-22 MED ORDER — ENALAPRIL MALEATE 5 MG PO TABS: 5.0000 mg | ORAL_TABLET | Freq: Every evening | ORAL | Status: DC

## 2013-12-01 ENCOUNTER — Other Ambulatory Visit: Payer: Self-pay | Admitting: Cardiovascular Disease

## 2013-12-16 ENCOUNTER — Telehealth: Payer: Self-pay | Admitting: Cardiovascular Disease

## 2013-12-16 NOTE — Telephone Encounter (Signed)
Received call from patient he stated he has appointment with Dr.Cooper 02/06/14 but would like to be seen sooner.Stated he has been having pain under left arm off and on for the last 3 months.No chest pain.No sob.Appointment scheduled with Cecilie Kicks NP at our [facility] office 12/25/13 at 8:30 am.Advised to go to ER if needed.

## 2013-12-16 NOTE — Telephone Encounter (Signed)
New message         Pt is feeling pressure behind his heart going toward his armpit / he is currently driving / pt has been feeling this way for several months and thought it was gas

## 2013-12-25 ENCOUNTER — Encounter: Payer: Self-pay | Admitting: Cardiology

## 2013-12-25 ENCOUNTER — Ambulatory Visit (INDEPENDENT_AMBULATORY_CARE_PROVIDER_SITE_OTHER): Payer: BC Managed Care – PPO | Admitting: Cardiology

## 2013-12-25 VITALS — BP 122/80 | HR 62 | Ht 72.0 in | Wt 230.0 lb

## 2013-12-25 DIAGNOSIS — I2510 Atherosclerotic heart disease of native coronary artery without angina pectoris: Secondary | ICD-10-CM

## 2013-12-25 DIAGNOSIS — Z954 Presence of other heart-valve replacement: Secondary | ICD-10-CM

## 2013-12-25 DIAGNOSIS — E785 Hyperlipidemia, unspecified: Secondary | ICD-10-CM

## 2013-12-25 DIAGNOSIS — R079 Chest pain, unspecified: Secondary | ICD-10-CM

## 2013-12-25 NOTE — Patient Instructions (Signed)
We will schedule stress myoview to evaluate chest pain.    Follow up with Dr. Burt Knack in July.  If more frequent lightheaded type spells with standing let us know.

## 2013-12-25 NOTE — Progress Notes (Signed)
12/28/2013   PCP: Vickii Penna., MD   Chief Complaint  Patient presents with  . Follow-up    pain under lt. breast for 3-4- months, pain is intermittent and is painful upon movement and deep breathing    Primary Cardiologist: Dr. Burt Knack  HPI:  63 year old male with a history of coronary artery disease, status post coronary bypass grafting in 2012, history of severe aortic stenosis status post aortic valve replacement pericardial tissue valve, history of peripheral vascular disease status post femoropopliteal bypass  presents today with some pain in his arm and chest.  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     Pain is been coming and going for several weeks without associated symptoms of nausea shortness of breath or diaphoresis sometimes he can recreate the pain by moving and sometimes by touching the area. Other times the pain is there without any of the above.  His lipids are followed by his primary care physician and the most recent ones from January: hgb A1C 6.3  t chol 144, TG 130  HDL 36 LDL 82  07/2013.  Patient admits to not always eating healthy has not been exercising has been through a divorce and he has not been taking very good care of himself recently we discussed options at [facility] head start walking he does have a dog and encouraged to walk the dog every day.  Allergies  Allergen Reactions  . Codeine Other (See Comments)    Blood drops  . Morphine And Related Itching    Current Outpatient Prescriptions  Medication Sig Dispense Refill  . amoxicillin (AMOXIL) 500 MG capsule Take 2,000 mg by mouth as directed. Take 4 - 500 mg tablets one hour before dental procedure,      . aspirin EC 81 MG tablet Take 81 mg by mouth every morning.      [name] atorvastatin (LIPITOR) 20 MG tablet TAKE ONE TABLET AT BEDTIME.  30 tablet  1  . citalopram (CELEXA) 40 MG tablet Take 40 mg by mouth every evening.       .  enalapril (VASOTEC) 5 MG tablet Take 1 tablet (5 mg total) by mouth every evening.  90 tablet  1  . HYDROcodone-acetaminophen (NORCO/VICODIN) 5-325 MG per tablet Take 1 tablet by mouth every 4 (four) hours as needed for pain.  6 tablet  0  . lansoprazole (PREVACID) 15 MG capsule Take 15 mg by mouth daily as needed. For acid reflux      . methotrexate (RHEUMATREX) 2.5 MG tablet Take 2.5 mg by mouth once a week. Caution:Chemotherapy. Protect from light.      . metoprolol (LOPRESSOR) 50 MG tablet Take 0.5 tablets (25 mg total) by mouth 2 (two) times daily.  1 tablet  0  . predniSONE (DELTASONE) 2.5 MG tablet Take 2.5 mg by mouth daily with breakfast.       No current facility-administered medications for this visit.    Past Medical History  Diagnosis Date  . Hypertension   . Hyperlipidemia   . Depression   . PVD (peripheral vascular disease)   . Femoral-popliteal bypass graft occlusion, right 1997    Dr. Kellie Simmering  . Claudication in peripheral vascular disease   . Aortic stenosis 2009    ef 40-45%   . Aortic valve disorder   . Coronary artery disease   . Shortness of breath   . GERD (gastroesophageal reflux  disease)   . Headache(784.0)   . Arthritis   . PONV (postoperative nausea and vomiting)     Past Surgical History  Procedure Laterality Date  . Rotator cuff repair      Left  . Cardiac catheterization  2009, 07/11/11    Dr. Irish Lack  . US echocardiography    . Knee arthroscopy      left  . Mri      to visualize aortic valve  . Femoral artery - popliteal artery bypass graft    . Aortic valve replacement  07/18/2011    Procedure: AORTIC VALVE REPLACEMENT (AVR);  Surgeon: Grace Isaac, MD;  Location: [facility];  Service: Open Heart Surgery;  Laterality: N/A;  . Coronary artery bypass graft  07/18/2011    Procedure: CORONARY ARTERY BYPASS GRAFTING (CABG);  Surgeon: Grace Isaac, MD;  Location: [facility];  Service: Open Heart Surgery;  Laterality: N/A;  times one to mammary  artery, left    CNO:BSJGGEZ:MO colds or fevers, no weight changes Skin:no rashes or ulcers HEENT:no blurred vision, no congestion CV:see HPI PUL:see HPI GI:no diarrhea constipation or melena, no indigestion GU:no hematuria, no dysuria MS:no joint pain, no claudication Neuro:no syncope, no lightheadedness Endo:borderline diabetes, no thyroid disease  PHYSICAL EXAM BP 122/80  Pulse 62  Ht 6' (1.829 m)  Wt 230 lb (104.327 kg)  BMI 31.19 kg/m2 General:Pleasant affect, NAD Skin:Warm and dry, brisk capillary refill HEENT:normocephalic, sclera clear, mucus membranes moist Neck:supple, no JVD, no bruits  Heart:S1S2 RRR without murmur, gallup, rub or+ click of valve, cannot recreate pain with movement or palpation Lungs:clear without rales, rhonchi, or wheezes QHU:TMLY, non tender, + BS, do not palpate liver spleen or masses Ext:no lower ext edema, 2+ pedal pulses, 2+ radial pulses Neuro:alert and oriented, MAE, follows commands, + facial symmetry EKG:SR no acute changes from previous tracings   ASSESSMENT AND PLAN Coronary artery disease History of bypass grafting LIMA to the left circumflex 2012  Heart valve replaced by other means Aortic valve replacement with a pericardial tissue  valve, 23 mm, [employer], model 3300TFX, serial [id number] and  coronary artery bypass grafting with the left internal mammary to the  circumflex coronary artery.    Hyperlipidemia Followed by primary care -- most recent LDL 82 HDL 36

## 2013-12-28 ENCOUNTER — Encounter: Payer: Self-pay | Admitting: Cardiology

## 2013-12-28 DIAGNOSIS — R079 Chest pain, unspecified: Secondary | ICD-10-CM | POA: Insufficient documentation

## 2013-12-28 NOTE — Assessment & Plan Note (Signed)
Aortic valve replacement with a pericardial tissue  valve, 23 mm, [employer], model 3300TFX, serial [id number] and  coronary artery bypass grafting with the left internal mammary to the  circumflex coronary artery.

## 2013-12-28 NOTE — Assessment & Plan Note (Signed)
History of bypass grafting LIMA to the left circumflex 2012

## 2013-12-28 NOTE — Assessment & Plan Note (Signed)
Followed by primary care -- most recent LDL 82 HDL 36

## 2014-01-01 ENCOUNTER — Telehealth (HOSPITAL_COMMUNITY): Payer: Self-pay

## 2014-01-06 ENCOUNTER — Other Ambulatory Visit: Payer: Self-pay | Admitting: Cardiovascular Disease

## 2014-01-07 ENCOUNTER — Encounter (HOSPITAL_COMMUNITY): Payer: BC Managed Care – PPO

## 2014-01-07 NOTE — Telephone Encounter (Signed)
Rx was sent to pharmacy electronically. 

## 2014-01-09 ENCOUNTER — Telehealth (HOSPITAL_COMMUNITY): Payer: Self-pay

## 2014-01-14 ENCOUNTER — Ambulatory Visit (HOSPITAL_COMMUNITY)
Admission: RE | Admit: 2014-01-14 | Discharge: 2014-01-14 | Disposition: A | Discharge status: Home / Self Care | Payer: BC Managed Care – PPO | Source: Ambulatory Visit | Attending: Cardiology | Admitting: Cardiology

## 2014-01-14 DIAGNOSIS — Z951 Presence of aortocoronary bypass graft: Secondary | ICD-10-CM | POA: Insufficient documentation

## 2014-01-14 DIAGNOSIS — R079 Chest pain, unspecified: Secondary | ICD-10-CM

## 2014-01-14 DIAGNOSIS — R0609 Other forms of dyspnea: Secondary | ICD-10-CM | POA: Insufficient documentation

## 2014-01-14 DIAGNOSIS — I10 Essential (primary) hypertension: Secondary | ICD-10-CM | POA: Insufficient documentation

## 2014-01-14 DIAGNOSIS — R42 Dizziness and giddiness: Secondary | ICD-10-CM | POA: Insufficient documentation

## 2014-01-14 DIAGNOSIS — Z954 Presence of other heart-valve replacement: Secondary | ICD-10-CM | POA: Insufficient documentation

## 2014-01-14 DIAGNOSIS — I70219 Atherosclerosis of native arteries of extremities with intermittent claudication, unspecified extremity: Secondary | ICD-10-CM | POA: Insufficient documentation

## 2014-01-14 DIAGNOSIS — R0989 Other specified symptoms and signs involving the circulatory and respiratory systems: Secondary | ICD-10-CM | POA: Insufficient documentation

## 2014-01-14 DIAGNOSIS — I2510 Atherosclerotic heart disease of native coronary artery without angina pectoris: Secondary | ICD-10-CM | POA: Insufficient documentation

## 2014-01-14 MED ORDER — AMINOPHYLLINE 25 MG/ML IV SOLN
75.0000 mg | Freq: Once | INTRAVENOUS | Status: AC
Start: 2014-01-14 — End: 2014-01-14
  Administered 2014-01-14: 75 mg via INTRAVENOUS

## 2014-01-14 MED ORDER — REGADENOSON 0.4 MG/5ML IV SOLN
0.4000 mg | Freq: Once | INTRAVENOUS | Status: AC
Start: 2014-01-14 — End: 2014-01-14
  Administered 2014-01-14: 0.4 mg via INTRAVENOUS

## 2014-01-14 MED ORDER — TECHNETIUM TC 99M SESTAMIBI GENERIC - CARDIOLITE
29.2000 | Freq: Once | INTRAVENOUS | Status: AC | PRN
  Administered 2014-01-14: 29.2 via INTRAVENOUS

## 2014-01-14 MED ORDER — TECHNETIUM TC 99M SESTAMIBI GENERIC - CARDIOLITE
10.2000 | Freq: Once | INTRAVENOUS | Status: AC | PRN
  Administered 2014-01-14: 10 via INTRAVENOUS

## 2014-01-14 NOTE — Procedures (Addendum)
[facility] [address] [place] [address] [phone]  Cardiology Nuclear Med Study  [name] is a 63 y.o. male     MRN : [id number]     DOB: [birth year]  Procedure Date: 01/14/2014  Nuclear Med Background Indication for Stress Test:  Evaluation for Ischemia History:  CAD;CABG X1-2012;HEART VALVE REPLACEMENT?;AVR;ECHO-07/13/2011-EF=55-60%;aortic stenosis Cardiac Risk Factors: Claudication, Hypertension, Lipids, Overweight and PVD  Symptoms:  Chest Pain, Dizziness and DOE   Nuclear Pre-Procedure Caffeine/Decaff Intake:  2:00am NPO After: 12pm   IV Site: R Hand  IV 0.9% NS with Angio Cath:  22g  Chest Size (in):  54"  IV Started by: Rolene Course, RN  Height: 6' (1.829 m)  Cup Size: n/a  BMI:  Body mass index is 31.19 kg/(m^2). Weight:  230 lb (104.327 kg)   Tech Comments:  Pt. Took Metoprolol and was changed to a Technical brewer Med Study 1 or 2 day study: 1 day  Stress Test Type:  [place] Provider:  Cecilie Kicks, [facility]   Resting Radionuclide: Technetium 64m Sestamibi  Resting Radionuclide Dose: 10.2 mCi   Stress Radionuclide:  Technetium 53m Sestamibi  Stress Radionuclide Dose: 29.2 mCi           Stress Protocol Rest HR: 64 Stress HR: 68  Rest BP: 123/74 Stress BP: 113/83  Exercise Time (min): n/a METS: n/a   Predicted Max HR: 160 bpm % Max HR: 45 bpm Rate Pressure Product: 9072  Dose of Adenosine (mg):  n/a Dose of Lexiscan: 0.4 mg  Dose of Atropine (mg): n/a Dose of Dobutamine: n/a mcg/kg/min (at max HR)  Stress Test Technologist: Leane Para, CCT Nuclear Technologist: Imagene Riches, CNMT   Rest Procedure:  Myocardial perfusion imaging was performed at rest 45 minutes following the intravenous administration of Technetium 19m Sestamibi. Stress Procedure:  The patient received IV Lexiscan 0.4 mg over 15-seconds.  Technetium 61m Sestamibi injected IV at 30-seconds.   Patient experienced SOB and 75 mg Aminophylline IV was administered. There were no significant changes with Lexiscan.  Quantitative spect images were obtained after a 45 minute delay.  Transient Ischemic Dilatation (Normal <1.22):  1.24  QGS EDV:  84 ml QGS ESV:  35 ml LV Ejection Fraction: 59%  PHYSICIAN INTERPRETATION  Rest ECG: NSR with non-specific ST-T wave changes  Stress ECG: Slightly more pronounced lateral TWI noted with Lexiscan.  QPS Raw Data Images:  Acquisition technically good; normal left ventricular size. Stress Images:  Normal homogeneous uptake in all areas of the myocardium.  Computer generated data suggests decreased perfusion in the mid to apical anterior anteroseptal wall that does not correlate with the visual image.   Rest Images:  Normal homogeneous uptake in all areas of the myocardium. Comparison with the stress images reveals no significant change.  Similar computer generated defects suggested - also not seen on visual image. Subtraction (SDS):  There is no evidence of scar or ischemia.  Impression Exercise Capacity:  Lexiscan with low level exercise. BP Response:  Normal blood pressure response. Clinical Symptoms:  There is dyspnea. ECG Impression:  Mild TWI in lateral leads - non-diagnostic. Comparison with Prior Nuclear Study: No images to compare  Overall Impression:  Normal stress nuclear study.  LV Wall Motion:  NL LV Function; NL Wall Motion   HARDING,DAVID W, MD  01/14/2014 6:36 PM

## 2014-01-21 ENCOUNTER — Telehealth: Payer: Self-pay | Admitting: Cardiovascular Disease

## 2014-01-21 NOTE — Telephone Encounter (Signed)
Walk In pt Form " [facility] [facility]" Lauren back Monday 6.29

## 2014-01-28 NOTE — Telephone Encounter (Signed)
Encounter complete. 

## 2014-02-05 NOTE — Telephone Encounter (Signed)
Encounter complete. 

## 2014-02-06 ENCOUNTER — Ambulatory Visit (INDEPENDENT_AMBULATORY_CARE_PROVIDER_SITE_OTHER): Payer: BC Managed Care – PPO | Admitting: Cardiovascular Disease

## 2014-02-06 ENCOUNTER — Encounter: Payer: Self-pay | Admitting: Cardiovascular Disease

## 2014-02-06 VITALS — BP 121/72 | HR 68 | Ht 73.0 in | Wt 224.0 lb

## 2014-02-06 DIAGNOSIS — I359 Nonrheumatic aortic valve disorder, unspecified: Secondary | ICD-10-CM

## 2014-02-06 DIAGNOSIS — I350 Nonrheumatic aortic (valve) stenosis: Secondary | ICD-10-CM

## 2014-02-06 NOTE — Patient Instructions (Signed)
Your physician recommends that you continue on your current medications as directed. Please refer to the Current Medication list given to you today.  Your physician wants you to follow-up in: 1 year ov You will receive a reminder letter in the mail two months in advance. If you don't receive a letter, please call our office to schedule the follow-up appointment.   Your physician has requested that you have an echocardiogram. Echocardiography is a painless test that uses sound waves to create images of your heart. It provides your doctor with information about the size and shape of your heart and how well your heart's chambers and valves are working. This procedure takes approximately one hour. There are no restrictions for this procedure.

## 2014-02-06 NOTE — Progress Notes (Signed)
HPI:   63 year old gentleman presenting for followup evaluation. The patient has a history of severe bicuspid aortic valve stenosis and he underwent aortic valve replacement with a pericardial tissue valve in 2012. He also has peripheral arterial disease and underwent femoropopliteal bypass. He has coronary artery disease and was treated with the LIMA to left circumflex graft at the time of his aortic valve replacement. He recently was seen for atypical chest pain and underwent a stress Myoview scan that demonstrated no ischemia. He presents today for followup evaluation. The patient works as a Psychiatrist and division to Neurosurgeon.  He has been walking regularly and does some interval sprinting without exertional symptoms. He has mild exertional dyspnea, but is much improved from his baseline before valve replacement surgery. He denies edema, orthopnea, or PND. He does admit to right calf claudication symptoms which have been stable over time. He has no left leg pain. Lipids are followed by his primary care physician and these were reviewed today. He is maintained on a statin drug.  Outpatient Encounter Prescriptions as of 02/06/2014  Medication Sig  . amoxicillin (AMOXIL) 500 MG capsule Take 2,000 mg by mouth as directed. Take 4 - 500 mg tablets one hour before dental procedure,  . aspirin EC 81 MG tablet Take 81 mg by mouth every morning.  [name] atorvastatin (LIPITOR) 20 MG tablet TAKE ONE TABLET AT BEDTIME.  . citalopram (CELEXA) 40 MG tablet Take 40 mg by mouth every evening.   . enalapril (VASOTEC) 5 MG tablet Take 1 tablet (5 mg total) by mouth every evening.  [name] HYDROcodone-acetaminophen (NORCO/VICODIN) 5-325 MG per tablet Take 1 tablet by mouth every 4 (four) hours as needed for pain.  [name] lansoprazole (PREVACID) 15 MG capsule Take 15 mg by mouth daily as needed. For acid reflux  . methotrexate (RHEUMATREX) 2.5 MG tablet Take 2.5 mg by mouth once a week. Caution:Chemotherapy.  Protect from light.  . metoprolol (LOPRESSOR) 50 MG tablet TAKE 1 TABLET TWICE DAILY.  [name] predniSONE (DELTASONE) 2.5 MG tablet Take 2.5 mg by mouth daily with breakfast.  . tadalafil (CIALIS) 10 MG tablet Take 10 mg by mouth.  . testosterone (ANDROGEL) 50 MG/5GM (1%) GEL Place 5 g onto the skin.    Allergies  Allergen Reactions  . Codeine Other (See Comments)    Blood drops  . Morphine And Related Itching    Past Medical History  Diagnosis Date  . Hypertension   . Hyperlipidemia   . Depression   . PVD (peripheral vascular disease)   . Femoral-popliteal bypass graft occlusion, right 1997    Dr. Kellie Simmering  . Claudication in peripheral vascular disease   . Aortic stenosis 2009    ef 40-45%   . Aortic valve disorder   . Coronary artery disease   . Shortness of breath   . GERD (gastroesophageal reflux disease)   . Headache(784.0)   . Arthritis   . PONV (postoperative nausea and vomiting)     ROS: Negative except as per HPI  BP 121/72  Pulse 68  Ht 6\' 1"  (1.854 m)  Wt 101.606 kg (224 lb)  BMI 29.56 kg/m2  PHYSICAL EXAM: Pt is alert and oriented, NAD HEENT: normal Neck: JVP - normal, carotids 2+= without bruits Lungs: CTA bilaterally CV: RRR with a grade 2/6 ejection murmur at right upper sternal border Abd: soft, NT, Positive BS, no hepatomegaly Ext: no C/C/E, DP and PT pulses are 2+ in the left foot and trace on  the right Skin: warm/dry no rash  Myoview Scan 01/14/2014: QPS  Raw Data Images: Acquisition technically good; normal left ventricular size.  Stress Images: Normal homogeneous uptake in all areas of the myocardium. Computer generated data suggests decreased perfusion in the mid to apical anterior anteroseptal wall that does not correlate with the visual image.  Rest Images: Normal homogeneous uptake in all areas of the myocardium. Comparison with the stress images reveals no significant change. Similar computer generated defects suggested - also not seen on  visual image.  Subtraction (SDS): There is no evidence of scar or ischemia.  Impression  Exercise Capacity: Lexiscan with low level exercise.  BP Response: Normal blood pressure response.  Clinical Symptoms: There is dyspnea.  ECG Impression: Mild TWI in lateral leads - non-diagnostic.  Comparison with Prior Nuclear Study: No images to compare  Overall Impression: Normal stress nuclear study.  LV Wall Motion: NL LV Function; NL Wall Motion   ASSESSMENT AND PLAN: 1. Coronary artery disease, native vessel. Recent Myoview was reviewed and there is no significant ischemia. He will continue on aspirin and a statin drug.  2. History of aortic stenosis status post aortic valve replacement. He has not had a postoperative study, so will check an echocardiogram for baseline evaluation. He has some exertional dyspnea but suspect this is noncardiac.  3. Peripheral arterial disease with stable right calf claudication. He has been followed by Dr. Kellie Simmering with no recent evaluation.  4. Hypertension. Blood pressure well controlled on metoprolol and enalapril.  5. Hyperlipidemia. The patient continues on atorvastatin. Lipids from January were reviewed through 'care everywhere.'  Sherren Mocha 02/06/2014 3:57 PM

## 2014-02-07 ENCOUNTER — Other Ambulatory Visit: Payer: Self-pay | Admitting: Cardiovascular Disease

## 2014-02-09 ENCOUNTER — Ambulatory Visit (HOSPITAL_COMMUNITY): Payer: BC Managed Care – PPO | Attending: Cardiology | Admitting: Cardiology

## 2014-02-09 DIAGNOSIS — Z954 Presence of other heart-valve replacement: Secondary | ICD-10-CM

## 2014-02-09 DIAGNOSIS — I350 Nonrheumatic aortic (valve) stenosis: Secondary | ICD-10-CM

## 2014-02-09 DIAGNOSIS — I359 Nonrheumatic aortic valve disorder, unspecified: Secondary | ICD-10-CM | POA: Insufficient documentation

## 2014-02-09 NOTE — Progress Notes (Signed)
Echo performed. 

## 2014-02-12 NOTE — Telephone Encounter (Signed)
Close Encounter 

## 2014-03-09 ENCOUNTER — Telehealth: Payer: Self-pay | Admitting: Cardiovascular Disease

## 2014-03-09 NOTE — Telephone Encounter (Signed)
[facility] Physician Clearance Form Completed by Dr.Cooper, cannot fax No fax # on paper  Mailed to Pt Home Address Per his request   [id number]/km

## 2014-04-21 DIAGNOSIS — M109 Gout, unspecified: Secondary | ICD-10-CM | POA: Insufficient documentation

## 2014-05-04 ENCOUNTER — Other Ambulatory Visit: Payer: Self-pay | Admitting: Cardiovascular Disease

## 2014-06-22 DIAGNOSIS — F3342 Major depressive disorder, recurrent, in full remission: Secondary | ICD-10-CM | POA: Insufficient documentation

## 2014-08-25 ENCOUNTER — Telehealth: Payer: Self-pay | Admitting: Cardiovascular Disease

## 2014-08-25 NOTE — Telephone Encounter (Signed)
New problem    Pt want to know if he can switch to taking ibuprofen instead of Aleve. Please advise pt.

## 2014-08-25 NOTE — Telephone Encounter (Signed)
Left message on machine for pt to contact the office.   

## 2014-08-25 NOTE — Telephone Encounter (Signed)
I spoke with the pt and he has been using Aleve as needed for headache and muscle aches.  The pt wanted to make sure that he is okay to use Ibuprofen in the place of Aleve.  I made the pt aware that this is okay to switch. I advised the pt not to take these medications for more than 5 days in a row due to increased risk of cardiac events with long term use.  Pt verbalized understanding.

## 2014-10-08 ENCOUNTER — Other Ambulatory Visit: Payer: Self-pay | Admitting: Cardiovascular Disease

## 2014-11-09 ENCOUNTER — Telehealth: Payer: Self-pay | Admitting: Cardiovascular Disease

## 2014-11-09 NOTE — Telephone Encounter (Signed)
I spoke with the pt and he said that he had lab work today with PCP and does not know the results at this time. The pt states that the PCP told him he needs to be on a higher dose of Atorvastatin for it to be beneficial from a cardiac standpoint.  I made the pt aware that we dose statins based on cholesterol results.  The pt would like Dr Burt Knack to review his labs results and give recommendations about his dosage.  The pt will send results to our office once they become available.

## 2014-11-09 NOTE — Telephone Encounter (Signed)
New message         Pt PCP would like to discuss changing pt dosage for Lipitor

## 2014-12-25 DIAGNOSIS — L409 Psoriasis, unspecified: Secondary | ICD-10-CM | POA: Insufficient documentation

## 2015-01-13 ENCOUNTER — Other Ambulatory Visit: Payer: Self-pay | Admitting: Cardiovascular Disease

## 2015-01-13 ENCOUNTER — Telehealth: Payer: Self-pay | Admitting: Cardiovascular Disease

## 2015-01-13 DIAGNOSIS — R0602 Shortness of breath: Secondary | ICD-10-CM

## 2015-01-13 NOTE — Telephone Encounter (Signed)
Left message to call back  

## 2015-01-13 NOTE — Telephone Encounter (Signed)
Spoke to patient

## 2015-01-13 NOTE — Telephone Encounter (Signed)
New Message  Pt having a little SoB, some fatigue Pt c/o Shortness Of Breath: STAT if SOB developed within the last 24 hours or pt is noticeably SOB on the phone  1. Are you currently SOB (can you hear that pt is SOB on the phone)? no  2. How long have you been experiencing SOB? 1 week  3. Are you SOB when sitting or when up moving around? Up moving around- bending over will trigger  4. Are you currently experiencing any other symptoms? Dizziness, fatigue, constant headache (sinus infection- like) sneezing makes headache worse

## 2015-01-13 NOTE — Telephone Encounter (Signed)
Talked to [name].  For a week has had increased feeling of shortness of breath. Able to talk in complete sentence without shortness of breath. No shortness of breath at rest.  Has feeling a increased shortness of breath with activity. No swelling legs, ankles, feet atany time.  Sleeps comfortable as normal with a couple pillows.   Thinks he has an appointment in July.  No future appointments seen. Will forward to lauren to follow up.

## 2015-01-14 ENCOUNTER — Ambulatory Visit
Admission: RE | Admit: 2015-01-14 | Discharge: 2015-01-14 | Disposition: A | Discharge status: Home / Self Care | Payer: BLUE CROSS/BLUE SHIELD | Source: Ambulatory Visit | Attending: Cardiovascular Disease | Admitting: Cardiovascular Disease

## 2015-01-14 DIAGNOSIS — R0602 Shortness of breath: Secondary | ICD-10-CM

## 2015-01-14 NOTE — Telephone Encounter (Signed)
Talked with patient.  Echocardiogram scheduled for tomorrow.  Chest xray, [facility] radiology. Scheduler for Dr. Burt Knack, Harrell Gave will schedule appointment with patient for follow up with APP or MD after echo and chest xray.

## 2015-01-14 NOTE — Telephone Encounter (Signed)
Would arrange echo and cxr, and FOV next available with me or PA/NP. thx

## 2015-01-15 ENCOUNTER — Other Ambulatory Visit: Payer: Self-pay

## 2015-01-15 ENCOUNTER — Ambulatory Visit (HOSPITAL_COMMUNITY): Payer: BLUE CROSS/BLUE SHIELD | Attending: Cardiovascular Disease

## 2015-01-15 DIAGNOSIS — R0602 Shortness of breath: Secondary | ICD-10-CM

## 2015-01-15 DIAGNOSIS — I351 Nonrheumatic aortic (valve) insufficiency: Secondary | ICD-10-CM | POA: Diagnosis not present

## 2015-01-15 DIAGNOSIS — I517 Cardiomegaly: Secondary | ICD-10-CM | POA: Diagnosis not present

## 2015-01-20 ENCOUNTER — Ambulatory Visit
Admission: RE | Admit: 2015-01-20 | Discharge: 2015-01-20 | Disposition: A | Discharge status: Home / Self Care | Payer: BLUE CROSS/BLUE SHIELD | Source: Ambulatory Visit | Attending: Cardiovascular Disease | Admitting: Cardiovascular Disease

## 2015-01-20 ENCOUNTER — Encounter: Payer: Self-pay | Admitting: Cardiovascular Disease

## 2015-01-20 DIAGNOSIS — R0602 Shortness of breath: Secondary | ICD-10-CM

## 2015-01-20 NOTE — Addendum Note (Signed)
Addended by: Barkley Boards on: 01/20/2015 08:49 AM   Modules accepted: Orders

## 2015-01-20 NOTE — Telephone Encounter (Signed)
This encounter was created in error - please disregard.

## 2015-01-20 NOTE — Telephone Encounter (Signed)
I spoke with the pt and made him aware of Echo and CXR results.  The pt will need a repeat CXR with nipple markers performed and he would like to have this done today.  I have scheduled the pt to follow-up with Dr Burt Knack tomorrow.

## 2015-01-20 NOTE — Telephone Encounter (Signed)
New message      Pt wants results from chest x-ray today.  Please advise

## 2015-01-21 ENCOUNTER — Ambulatory Visit (INDEPENDENT_AMBULATORY_CARE_PROVIDER_SITE_OTHER): Payer: BLUE CROSS/BLUE SHIELD | Admitting: Cardiovascular Disease

## 2015-01-21 ENCOUNTER — Encounter: Payer: Self-pay | Admitting: Cardiovascular Disease

## 2015-01-21 VITALS — BP 112/82 | HR 77 | Ht 73.0 in | Wt 226.8 lb

## 2015-01-21 DIAGNOSIS — E785 Hyperlipidemia, unspecified: Secondary | ICD-10-CM | POA: Diagnosis not present

## 2015-01-21 DIAGNOSIS — I350 Nonrheumatic aortic (valve) stenosis: Secondary | ICD-10-CM | POA: Diagnosis not present

## 2015-01-21 DIAGNOSIS — I2510 Atherosclerotic heart disease of native coronary artery without angina pectoris: Secondary | ICD-10-CM | POA: Diagnosis not present

## 2015-01-21 DIAGNOSIS — I359 Nonrheumatic aortic valve disorder, unspecified: Secondary | ICD-10-CM | POA: Diagnosis not present

## 2015-01-21 NOTE — Progress Notes (Signed)
Cardiology Office Note   Date:  01/25/2015   ID:  [name], DOB Nov 29, 1950, MRN [id number]  PCP:  Vickii Penna., MD  Cardiologist:  Sherren Mocha, MD    Chief Complaint  Patient presents with  . Shortness of Breath   History of Present Illness: [name] is a 64 y.o. male who presents for evaluation of shortness of breath. The patient has a history of severe bicuspid aortic valve stenosis and he underwent aortic valve replacement with a pericardial tissue valve in 2012. He also has peripheral arterial disease and underwent femoropopliteal bypass. He has coronary artery disease and was treated with the LIMA to left circumflex graft at the time of his aortic valve replacement.  He's been under a lot of stress related to family issues and now attributes many of his symptoms to stress and depression. He describes DOE and also felt like at times he has to take a deep breath even when resting in order to catch his breath. This seems to have resolved over the last several days. Denies CP, lightheadedness, or syncope.    Past Medical History  Diagnosis Date  . Hypertension   . Hyperlipidemia   . Depression   . PVD (peripheral vascular disease)   . Femoral-popliteal bypass graft occlusion, right 1997    Dr. Kellie Simmering  . Claudication in peripheral vascular disease   . Aortic stenosis 2009    ef 40-45%   . Aortic valve disorder   . Coronary artery disease   . Shortness of breath   . GERD (gastroesophageal reflux disease)   . Headache(784.0)   . Arthritis   . PONV (postoperative nausea and vomiting)     Past Surgical History  Procedure Laterality Date  . Rotator cuff repair      Left  . Cardiac catheterization  2009, 07/11/11    Dr. Irish Lack  . US echocardiography    . Knee arthroscopy      left  . Mri      to visualize aortic valve  . Femoral artery - popliteal artery bypass graft    . Aortic valve replacement  07/18/2011    Procedure: AORTIC VALVE  REPLACEMENT (AVR);  Surgeon: Grace Isaac, MD;  Location: [facility];  Service: Open Heart Surgery;  Laterality: N/A;  . Coronary artery bypass graft  07/18/2011    Procedure: CORONARY ARTERY BYPASS GRAFTING (CABG);  Surgeon: Grace Isaac, MD;  Location: [facility];  Service: Open Heart Surgery;  Laterality: N/A;  times one to mammary artery, left    Current Outpatient Prescriptions  Medication Sig Dispense Refill  . aspirin EC 81 MG tablet Take 81 mg by mouth every morning.    [name] atorvastatin (LIPITOR) 20 MG tablet Take 1 tablet by mouth at bedtime.    . citalopram (CELEXA) 40 MG tablet Take 40 mg by mouth every evening.     . enalapril (VASOTEC) 5 MG tablet Take 5 mg by mouth daily.    [name] HYDROcodone-acetaminophen (NORCO/VICODIN) 5-325 MG per tablet Take 1 tablet by mouth every 4 (four) hours as needed for pain. 6 tablet 0  . lansoprazole (PREVACID) 15 MG capsule Take 15 mg by mouth daily as needed. For acid reflux    . methotrexate (RHEUMATREX) 2.5 MG tablet Take 2.5 mg by mouth once a week. Caution:Chemotherapy. Protect from light.    . metoprolol (LOPRESSOR) 50 MG tablet Take 1 tablet by mouth 2 (two) times daily.     No current  facility-administered medications for this visit.    Allergies:   Codeine and Morphine and related   Social History:  The patient  reports that he has never smoked. He does not have any smokeless tobacco history on file. He reports that he does not drink alcohol or use illicit drugs.   Family History:  The patient's family history includes Cancer in his father and sister; Healthy in his brother and maternal grandfather; Heart disease in his maternal grandmother.    ROS:  Please see the history of present illness.  Otherwise, review of systems is positive for constipation, dizziness with standing upright, fatigue, shortness of breath with activity, and dizziness.  All other systems are reviewed and negative.    PHYSICAL EXAM: VS:  BP 112/82 mmHg  Pulse 77   Ht 6\' 1"  (1.854 m)  Wt 226 lb 12.8 oz (102.876 kg)  BMI 29.93 kg/m2 , BMI Body mass index is 29.93 kg/(m^2). GEN: Well nourished, well developed, in no acute distress HEENT: normal Neck: no JVD, no masses. No carotid bruits Cardiac: RRR with 2/6 systolic murmur at the LSB                Respiratory:  clear to auscultation bilaterally, normal work of breathing GI: soft, nontender, nondistended, + BS MS: no deformity or atrophy Ext: no pretibial edema Skin: warm and dry, no rash Neuro:  Strength and sensation are intact Psych: euthymic mood, full affect  EKG:  EKG is ordered today. The ekg ordered today shows NSR with frequent PAC's. T wave changes consider lateral ischemia.   Recent Labs: No results found for requested labs within last 365 days.   Lipid Panel     Component Value Date/Time   CHOL 136 05/27/2012 0921   TRIG 139.0 05/27/2012 0921   HDL 29.20* 05/27/2012 0921   CHOLHDL 5 05/27/2012 0921   VLDL 27.8 05/27/2012 0921   LDLCALC 79 05/27/2012 0921   Wt Readings from Last 3 Encounters:  01/21/15 226 lb 12.8 oz (102.876 kg)  02/06/14 224 lb (101.606 kg)  01/14/14 230 lb (104.327 kg)    Cardiac Studies Reviewed: 2D Echo: Left ventricle: The cavity size was normal. Wall thickness was increased in a pattern of mild LVH. Systolic function was normal. The estimated ejection fraction was in the range of 55% to 60%.  ------------------------------------------------------------------- Aortic valve: Tissue AVR with normal systolic gradients. Trivial perivalvular regurgitation on septal side of ring. In 5 chamber view it appeared that it could be a small membranous VSD but flow is diastolic and no VSD seen in other views. Doppler:   VTI ratio of LVOT to aortic valve: 0.37. Valve area (VTI): 1.16 cm^2. Indexed valve area (VTI): 0.5 cm^2/m^2. Peak velocity ratio of LVOT to aortic valve: 0.34. Valve area (Vmax): 1.07 cm^2. Indexed valve area (Vmax): 0.46 cm^2/m^2. Mean  velocity ratio of LVOT to aortic valve: 0.38. Valve area (Vmean): 1.2 cm^2. Indexed valve area (Vmean): 0.52 cm^2/m^2. Mean gradient (S): 11 mm Hg. Peak gradient (S): 22 mm Hg.  ------------------------------------------------------------------- Mitral valve:  Mildly thickened leaflets . Doppler: There was trivial regurgitation.  ------------------------------------------------------------------- Left atrium: The atrium was mildly dilated.  ------------------------------------------------------------------- Atrial septum: A patent foramen ovale cannot be excluded.  ------------------------------------------------------------------- Right ventricle: The cavity size was normal. Wall thickness was normal. Systolic function was normal.  ------------------------------------------------------------------- Pulmonic valve:  Doppler: There was trivial regurgitation.  ------------------------------------------------------------------- Tricuspid valve:  Doppler: There was mild regurgitation.  ------------------------------------------------------------------- Right atrium: The atrium was normal in size.  -------------------------------------------------------------------  Pericardium: The pericardium was normal in appearance.  CXR: IMPRESSION: 1. Nodular density projected left lung base, most likely nipple shadow. Repeat chest x-ray with nipple markers suggested.  2. No acute cardiopulmonary disease.  Myoview Scan 01/14/2014: QPS  Raw Data Images: Acquisition technically good; normal left ventricular size.  Stress Images: Normal homogeneous uptake in all areas of the myocardium. Computer generated data suggests decreased perfusion in the mid to apical anterior anteroseptal wall that does not correlate with the visual image.  Rest Images: Normal homogeneous uptake in all areas of the myocardium. Comparison with the stress images reveals no significant change. Similar  computer generated defects suggested - also not seen on visual image.  Subtraction (SDS): There is no evidence of scar or ischemia.  Impression  Exercise Capacity: Lexiscan with low level exercise.  BP Response: Normal blood pressure response.  Clinical Symptoms: There is dyspnea.  ECG Impression: Mild TWI in lateral leads - non-diagnostic.  Comparison with Prior Nuclear Study: No images to compare  Overall Impression: Normal stress nuclear study.  LV Wall Motion: NL LV Function; NL Wall Motion   ASSESSMENT AND PLAN: 1.  CAD, native vessel: no anginal symptoms. Myoview last year negative for ischemia. Continue current medical therapy.  2. Aortic valve disease s/p AVR: recent echo reviewed. Continue regular clinical follow-up.  3. PAD: followed by [facility]. On appropriate medical therapy.  4. HTN: BP controlled on enalapril, metoprolol.  5. Hyperlipidemia: treated with atorvastatin. Lifestyle modification reviewed at length.  6. Dyspnea: echo and CXR reviewed. Suspect components of stress/depression/deconditioning at play. He is doing better. Reassurance provided.   Current medicines are reviewed with the patient today.  The patient does not have concerns regarding medicines.  Labs/ tests ordered today include:   Orders Placed This Encounter  Procedures  . EKG 12-Lead    Disposition:   FU one year  Signed, Sherren Mocha, MD  01/25/2015 11:15 PM    [facility] [place], [place], [place]  [address] Phone: [phone]; Fax: [phone]

## 2015-01-21 NOTE — Patient Instructions (Signed)

## 2015-03-16 ENCOUNTER — Telehealth: Payer: Self-pay | Admitting: Cardiovascular Disease

## 2015-03-16 NOTE — Telephone Encounter (Signed)
I will forward this message to Dr Burt Knack to review in regards to pt starting Humira. I left the pt a message that I will contact him after Dr Burt Knack reviews his chart.

## 2015-03-16 NOTE — Telephone Encounter (Signed)
New message    Patient calling has another MD wants to put him on new medication -Humira . No upcoming procedure. Wants to discuss with Dr. Burt Knack

## 2015-03-18 NOTE — Telephone Encounter (Signed)
I called and left a detailed message on the patient's voicemail. I have reviewed the drug information for Humira. I do not see any absolute contraindication from a cardiac perspective. There is a warning about patient's with congestive heart failure and this patient has not had that problem. Advised that he is welcome to call back if he has any further questions.  Sherren Mocha 03/18/2015 7:20 PM

## 2015-03-19 NOTE — Telephone Encounter (Signed)
Left message on machine for pt to contact the office.   

## 2015-03-19 NOTE — Telephone Encounter (Signed)
Follow up      Pt is calling to see if Dr Burt Knack said it was ok to start humira---pt states there was no msg on vm.

## 2015-03-19 NOTE — Telephone Encounter (Signed)
I spoke with the pt and made him aware of Dr Antionette Char comments. The pt said he plans to try Humira.

## 2015-03-23 ENCOUNTER — Other Ambulatory Visit: Payer: Self-pay | Admitting: Cardiovascular Disease

## 2015-03-28 ENCOUNTER — Other Ambulatory Visit: Payer: Self-pay | Admitting: Cardiovascular Disease

## 2015-04-13 ENCOUNTER — Other Ambulatory Visit: Payer: Self-pay | Admitting: Cardiovascular Disease

## 2015-06-30 DIAGNOSIS — Z79899 Other long term (current) drug therapy: Secondary | ICD-10-CM | POA: Insufficient documentation

## 2015-06-30 HISTORY — DX: Other long term (current) drug therapy: Z79.899

## 2015-08-01 HISTORY — PX: OTHER SURGICAL HISTORY: SHX169

## 2015-11-09 ENCOUNTER — Other Ambulatory Visit: Payer: Self-pay | Admitting: Cardiovascular Disease

## 2015-12-08 ENCOUNTER — Other Ambulatory Visit: Payer: Self-pay | Admitting: Cardiovascular Disease

## 2016-02-19 ENCOUNTER — Other Ambulatory Visit: Payer: Self-pay | Admitting: Cardiovascular Disease

## 2016-03-01 DIAGNOSIS — K645 Perianal venous thrombosis: Secondary | ICD-10-CM | POA: Insufficient documentation

## 2016-03-14 DIAGNOSIS — Z86010 Personal history of colonic polyps: Secondary | ICD-10-CM | POA: Insufficient documentation

## 2016-03-21 ENCOUNTER — Other Ambulatory Visit: Payer: Self-pay | Admitting: Cardiovascular Disease

## 2016-04-04 ENCOUNTER — Other Ambulatory Visit: Payer: Self-pay | Admitting: Cardiovascular Disease

## 2016-04-12 ENCOUNTER — Encounter: Payer: Self-pay | Admitting: Physician Assistant

## 2016-04-13 ENCOUNTER — Encounter: Payer: Self-pay | Admitting: Physician Assistant

## 2016-04-13 DIAGNOSIS — I10 Essential (primary) hypertension: Secondary | ICD-10-CM | POA: Insufficient documentation

## 2016-04-13 NOTE — Progress Notes (Addendum)
Cardiology Office Note    Date:  04/14/2016  ID:  [name], DOB 1951/07/16, MRN [id number] PCP:  Vickii Penna., MD  Cardiologist:  Burt Knack  Chief Complaint: near syncope  History of Present Illness:  [name] is a 65 y.o. male with history of severe bicuspid AV stenosis s/p pericardial tissue valve 2012, CAD s/p CABG (LIMA-LCx) at time of AVR, PAD s/p fem-pop bypass, depression, HTN, hyperlipidemia, GERD who presents for follow-up. Per chart review, 2D echo 12/2014: mild LVH, EF 55-60%, trissue AVR with normal systolic gradients, trivial perivalvular regurg on septal side of ring, mild LAE. Carotid duplex 2014: stable 1-39% BICA. At time of cath 2012 he had mild nonobstructive LAD and RCA stenoses. Nuc 2015 was normal. Last labs 2015: Hgb 15.2, LDL 82, Cr 1.08.  He presents today for follow-up, reporting an episode of near syncope on 04/08/16. 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 He was trying to stay hydrated during the game but hadn't really drank a lot prior to the game. He began to feel weak in the legs and lethargic. At the 3rd quarter he told his colleagues he didn't feel well. He also began to feel very nauseated. He was helped to the EMS truck where he was hooked up to the monitor and BP cuff. His memory is fuzzy from around this time - does not remember getting into the EMS truck. He does remember being told his heart rate/rhythm was fine. He did not lose consciousness. He was treated with IV fluids as they told him he was dehydrated. He does not remember if his BP was initially low but as he began to perk up he was told it was normal. He began to feel much better and the fog lifted. The team doctor recommended that he go be checked out at the hospital but he declined. He felt somewhat tired the next day/somewhat out of it, without focal symptom. Since that time he has felt well. This has never happened before. He denies any CP, palpitations, dyspnea,  visual changes, aphasia during the episode. He feels great today.  He otherwise has had an uneventful year - no recent chest pain, dyspnea, palpitations, LEE, orthopnea. He reports a gradual 8lb weight gain after not exercising as faithfully as he should.  Past Medical History:  Diagnosis Date  . Aortic stenosis    a. severe bicuspid AV stenosis s/p pericardial tissue valve 2012.  . Arthritis   . Coronary artery disease    a. s/p CABG (LIMA-LCx) at time of AVR 2012, mild nonobstructive LAD and RCA stenoses.  . Depression   . Femoral-popliteal bypass graft occlusion, right [facility]) 1997   Dr. Kellie Simmering  . GERD (gastroesophageal reflux disease)   . Headache(784.0)   . Hyperlipidemia   . Hypertension   . PONV (postoperative nausea and vomiting)   . PVD (peripheral vascular disease) ([place])     Past Surgical History:  Procedure Laterality Date  . AORTIC VALVE REPLACEMENT  07/18/2011   Procedure: AORTIC VALVE REPLACEMENT (AVR);  Surgeon: Grace Isaac, MD;  Location: [facility];  Service: Open Heart Surgery;  Laterality: N/A;  . CARDIAC CATHETERIZATION  2009, 07/11/11   Dr. Irish Lack  . CORONARY ARTERY BYPASS GRAFT  07/18/2011   Procedure: CORONARY ARTERY BYPASS GRAFTING (CABG);  Surgeon: Grace Isaac, MD;  Location: [facility];  Service: Open Heart Surgery;  Laterality: N/A;  times one to mammary artery, left  . FEMORAL ARTERY -  POPLITEAL ARTERY BYPASS GRAFT    . KNEE ARTHROSCOPY     left  . MRI     to visualize aortic valve  . ROTATOR CUFF REPAIR     Left  . US ECHOCARDIOGRAPHY      Current Medications: Current Outpatient Prescriptions  Medication Sig Dispense Refill  . Adalimumab (HUMIRA PEN) 40 MG/0.8ML PNKT Inject 40 mg into the skin every 14 (fourteen) days.    [name] aspirin EC 81 MG tablet Take 81 mg by mouth every morning.    [name] atorvastatin (LIPITOR) 20 MG tablet TAKE ONE TABLET AT BEDTIME. 30 tablet 10  . citalopram (CELEXA) 40 MG tablet Take 40 mg by mouth every evening.     .  enalapril (VASOTEC) 5 MG tablet Take 1 tablet (5 mg total) by mouth daily. Please keep 06/29/16 appointment for further refills 30 tablet 3  . lansoprazole (PREVACID) 15 MG capsule Take 15 mg by mouth daily as needed. For acid reflux    . metoprolol (LOPRESSOR) 50 MG tablet Take 1 tablet by mouth 2 (two) times daily.    . tamsulosin (FLOMAX) 0.4 MG CAPS capsule Take 1 capsule by mouth daily.     No current facility-administered medications for this visit.      Allergies:   Codeine and Morphine and related   Social History   Social History  . Marital status: Married    Spouse name: N/A  . Number of children: N/A  . Years of education: N/A   Occupational History  . [employer]    Social History Main Topics  . Smoking status: Never Smoker  . Smokeless tobacco: Never Used  . Alcohol use No  . Drug use: No  . Sexual activity: Not Asked   Other Topics Concern  . None   Social History Narrative  . None     Family History:  The patient's family history includes Cancer in his father and sister; Healthy in his brother and maternal grandfather; Heart disease in his maternal grandmother.   ROS:   Please see the history of present illness. + chronic HA since 2012. All other systems are reviewed and otherwise negative.    PHYSICAL EXAM:   VS:  BP 114/82   Pulse 77   Ht 6' (1.829 m)   Wt 228 lb 12.8 oz (103.8 kg)   BMI 31.03 kg/m   BMI: Body mass index is 31.03 kg/m. GEN: Well nourished, well developed WM, in no acute distress  HEENT: normocephalic, atraumatic Neck: no JVD, carotid bruits, or masses Cardiac: RRR; soft SEM RUSB, crisp valve click, no rubs or gallops, no edema  Respiratory:  clear to auscultation bilaterally, normal work of breathing GI: soft, nontender, nondistended, + BS MS: no deformity or atrophy  Skin: warm and dry, no rash Neuro:  Alert and Oriented x 3, Strength and sensation are intact, follows commands. No focal deficit noted Psych:  euthymic mood, full affect  Wt Readings from Last 3 Encounters:  04/14/16 228 lb 12.8 oz (103.8 kg)  01/21/15 226 lb 12.8 oz (102.9 kg)  02/06/14 224 lb (101.6 kg)      Studies/Labs Reviewed:   EKG:  EKG was ordered today and personally reviewed by me and demonstrates NSR 77bpm with sinus arrhythmia, nonspecific T wave changes. No acute change from prior.  Recent Labs: No results found for requested labs within last 8760 hours.   Lipid Panel    Component Value Date/Time   CHOL 136 05/27/2012 0921  TRIG 139.0 05/27/2012 0921   HDL 29.20 (L) 05/27/2012 0921   CHOLHDL 5 05/27/2012 0921   VLDL 27.8 05/27/2012 0921   LDLCALC 79 05/27/2012 0921    Additional studies/ records that were reviewed today include: Summarized above.    ASSESSMENT & PLAN:   1. Near syncope - symptoms suspicious for heat stroke/dehydration, but his residual lethargy the following day is somewhat unusual. Given his history of AVR, amnesia surrounding the event, and above description of symptoms, I think it is prudent to check CT of the head to exclude embolic event. (He also reports chronic daily headache since 2012 that has not been worked up in the past.) Will also check labs for any acute abnormality. Update echocardiogram. 2. Aortic stenosis - f/u echo to ensure no changes given above episode. Reminded him about need for abx prophylaxis before dental visits. He states he has abx at home that were prescribed in the past in case of future visits - he will check on the age of the rx. 3. CAD - no recent angina. Continue ASA, BB, statin. If LVEF is normal do not suspect further ischemic workup is needed right now. 4. Essential HTN - follow for recurrent symptoms. May need to stop ACEI if he has further episodes. Reviewed importance of staying well hydrated in the heat and seeking shelter if it is too hot outside. 5. Hyperlipidemia - he has eaten today. When he returns for f/u will check  LFTs/lipids.  Disposition: F/u with me in 1 month. Has f/u end of November 2017 with Dr. Burt Knack.  Medication Adjustments/Labs and Tests Ordered: Current medicines are reviewed at length with the patient today.  Concerns regarding medicines are outlined above. Medication changes, Labs and Tests ordered today are summarized above and listed in the Patient Instructions accessible in Encounters.   Raechel Ache PA-C  04/14/2016 9:50 AM    [address] [place], [place], [place]  [address] Phone: [phone]; Fax: [phone]

## 2016-04-14 ENCOUNTER — Ambulatory Visit (INDEPENDENT_AMBULATORY_CARE_PROVIDER_SITE_OTHER): Payer: BLUE CROSS/BLUE SHIELD | Admitting: Physician Assistant

## 2016-04-14 ENCOUNTER — Ambulatory Visit
Admission: RE | Admit: 2016-04-14 | Discharge: 2016-04-14 | Disposition: A | Discharge status: Home / Self Care | Payer: BLUE CROSS/BLUE SHIELD | Source: Ambulatory Visit | Attending: Physician Assistant | Admitting: Physician Assistant

## 2016-04-14 ENCOUNTER — Encounter: Payer: Self-pay | Admitting: Physician Assistant

## 2016-04-14 ENCOUNTER — Encounter (INDEPENDENT_AMBULATORY_CARE_PROVIDER_SITE_OTHER): Payer: Self-pay

## 2016-04-14 VITALS — BP 114/82 | HR 77 | Ht 72.0 in | Wt 228.8 lb

## 2016-04-14 DIAGNOSIS — I2510 Atherosclerotic heart disease of native coronary artery without angina pectoris: Secondary | ICD-10-CM | POA: Diagnosis not present

## 2016-04-14 DIAGNOSIS — I350 Nonrheumatic aortic (valve) stenosis: Secondary | ICD-10-CM | POA: Diagnosis not present

## 2016-04-14 DIAGNOSIS — I10 Essential (primary) hypertension: Secondary | ICD-10-CM

## 2016-04-14 DIAGNOSIS — R55 Syncope and collapse: Secondary | ICD-10-CM

## 2016-04-14 DIAGNOSIS — E785 Hyperlipidemia, unspecified: Secondary | ICD-10-CM

## 2016-04-14 LAB — CBC
HCT: 40.7 % (ref 38.5–50.0)
Hemoglobin: 13.1 g/dL — ABNORMAL LOW (ref 13.2–17.1)
MCH: 26.7 pg — ABNORMAL LOW (ref 27.0–33.0)
MCHC: 32.2 g/dL (ref 32.0–36.0)
MCV: 83.1 fL (ref 80.0–100.0)
MPV: 9.1 fL (ref 7.5–12.5)
Platelets: 290 10*3/uL (ref 140–400)
RBC: 4.9 MIL/uL (ref 4.20–5.80)
RDW: 15.7 % — ABNORMAL HIGH (ref 11.0–15.0)
WBC: 7.3 10*3/uL (ref 3.8–10.8)

## 2016-04-14 LAB — BASIC METABOLIC PANEL
BUN: 14 mg/dL (ref 7–25)
CO2: 23 mmol/L (ref 20–31)
Calcium: 9.1 mg/dL (ref 8.6–10.3)
Chloride: 105 mmol/L (ref 98–110)
Creat: 1.2 mg/dL (ref 0.70–1.25)
Glucose, Bld: 89 mg/dL (ref 65–99)
Potassium: 4.6 mmol/L (ref 3.5–5.3)
Sodium: 136 mmol/L (ref 135–146)

## 2016-04-14 MED ORDER — ENALAPRIL MALEATE 5 MG PO TABS: 5.0000 mg | ORAL_TABLET | Freq: Every day | ORAL | 3 refills | Status: DC

## 2016-04-14 NOTE — Patient Instructions (Signed)
Your physician recommends that you continue on your current medications as directed. Please refer to the Current Medication list given to you today.  Your physician recommends that you return for lab work today (BMET, CBC), and in 4 weeks (LIPIDS, LIVER)  Non-Cardiac CT scanning, (CAT scanning), is a noninvasive, special x-ray that produces cross-sectional images of the body using x-rays and a computer. CT scans help physicians diagnose and treat medical conditions.  CT scans provide greater clarity and reveal more details than regular x-ray exams.  Your physician has requested that you have an echocardiogram. Echocardiography is a painless test that uses sound waves to create images of your heart. It provides your doctor with information about the size and shape of your heart and how well your heart's chambers and valves are working. This procedure takes approximately one hour. There are no restrictions for this procedure.  Your physician recommends that you schedule a follow-up appointment in: 4 weeks with Melina Copa, PA-C.  Your physician recommends that you keep your follow-up appointment on 06/29/16 with Dr. Burt Knack.

## 2016-04-14 NOTE — Addendum Note (Signed)
Addended by: Rodman Key on: 04/14/2016 11:11 AM   Modules accepted: Orders

## 2016-04-14 NOTE — Addendum Note (Signed)
Addended by: Rodman Key on: 04/14/2016 02:07 PM   Modules accepted: Orders

## 2016-04-17 ENCOUNTER — Ambulatory Visit (INDEPENDENT_AMBULATORY_CARE_PROVIDER_SITE_OTHER)
Admission: RE | Admit: 2016-04-17 | Discharge: 2016-04-17 | Disposition: A | Discharge status: Home / Self Care | Payer: BLUE CROSS/BLUE SHIELD | Source: Ambulatory Visit | Attending: Physician Assistant | Admitting: Physician Assistant

## 2016-04-17 DIAGNOSIS — R55 Syncope and collapse: Secondary | ICD-10-CM

## 2016-04-28 ENCOUNTER — Ambulatory Visit (HOSPITAL_COMMUNITY): Payer: BLUE CROSS/BLUE SHIELD | Attending: Cardiology

## 2016-04-28 ENCOUNTER — Other Ambulatory Visit: Payer: Self-pay

## 2016-04-28 DIAGNOSIS — I350 Nonrheumatic aortic (valve) stenosis: Secondary | ICD-10-CM

## 2016-04-28 DIAGNOSIS — I77810 Thoracic aortic ectasia: Secondary | ICD-10-CM | POA: Diagnosis not present

## 2016-04-28 DIAGNOSIS — Z953 Presence of xenogenic heart valve: Secondary | ICD-10-CM | POA: Insufficient documentation

## 2016-04-28 DIAGNOSIS — R55 Syncope and collapse: Secondary | ICD-10-CM | POA: Diagnosis not present

## 2016-04-28 DIAGNOSIS — I2510 Atherosclerotic heart disease of native coronary artery without angina pectoris: Secondary | ICD-10-CM | POA: Insufficient documentation

## 2016-04-28 DIAGNOSIS — I351 Nonrheumatic aortic (valve) insufficiency: Secondary | ICD-10-CM | POA: Diagnosis not present

## 2016-04-28 DIAGNOSIS — E785 Hyperlipidemia, unspecified: Secondary | ICD-10-CM | POA: Diagnosis not present

## 2016-04-28 DIAGNOSIS — I119 Hypertensive heart disease without heart failure: Secondary | ICD-10-CM | POA: Diagnosis not present

## 2016-04-28 DIAGNOSIS — I359 Nonrheumatic aortic valve disorder, unspecified: Secondary | ICD-10-CM | POA: Diagnosis present

## 2016-04-28 LAB — ECHOCARDIOGRAM COMPLETE
AO mean calculated velocity dopler: 151 cm/s
AV Area VTI index: 0.46 cm2/m2
AV Area VTI: 1.01 cm2
AV Area mean vel: 1.12 cm2
AV Mean grad: 11 mmHg
AV Peak grad: 22 mmHg
AV VEL mean LVOT/AV: 0.36
AV area mean vel ind: 0.49 cm2/m2
AV peak Index: 0.45
AV pk vel: 236 cm/s
AV vel: 1.03
Ao pk vel: 0.32 m/s
Ao-asc: 41 cm
E decel time: 243 msec
E/e' ratio: 10.06
FS: 33 % (ref 28–44)
IVS/LV PW RATIO, ED: 1.06
LA ID, A-P, ES: 43 mm
LA diam end sys: 43 mm
LA diam index: 1.9 cm/m2
LA vol A4C: 76 ml
LA vol index: 34.1 mL/m2
LA vol: 77 mL
LV E/e' medial: 10.06
LV E/e'average: 10.06
LV PW d: 12.7 mm — AB (ref 0.6–1.1)
LV dias vol index: 34 mL/m2
LV dias vol: 77 mL (ref 62–150)
LV e' LATERAL: 9.26 cm/s
LV sys vol index: 15 mL/m2
LV sys vol: 33 mL (ref 21–61)
LVOT SV: 56 mL
LVOT VTI: 17.7 cm
LVOT area: 3.14 cm2
LVOT diameter: 20 mm
LVOT peak VTI: 0.33 cm
LVOT peak grad rest: 2 mmHg
LVOT peak vel: 76.1 cm/s
MV Dec: 243
MV Peak grad: 3 mmHg
MV pk A vel: 57.4 m/s
MV pk E vel: 93.2 m/s
P 1/2 time: 740 ms
Reg peak vel: 241 cm/s
S' Lateral: 9.94 cm/s
Simpson's disk: 57
Stroke v: 44 ml
TDI e' lateral: 9.26
TDI e' medial: 7.02
TR max vel: 241 cm/s
VTI: 54 cm
Valve area index: 0.46
Valve area: 1.03 cm2

## 2016-05-11 ENCOUNTER — Encounter: Payer: Self-pay | Admitting: Physician Assistant

## 2016-05-11 NOTE — Progress Notes (Signed)
Cardiology Office Note    Date:  05/12/2016  ID:  [name], DOB May 31, 1951, MRN [id number] PCP:  Vickii Penna., MD  Cardiologist:  Burt Knack   Chief Complaint: f/u near-syncope  History of Present Illness:  [name] is a 65 y.o. male with history of severe bicuspid AV stenosis s/p pericardial tissue valve 2012, CAD s/p CABG (LIMA-LCx) at time of AVR, PAD s/p fem-pop bypass, depression, HTN, hyperlipidemia, GERD, near-syncope 04/14/16 felt due to heat stroke who presents for follow-up. 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 See note from that visit for full details. He had improved with IV fluid. We checked labs 04/14/16 showing Cr 1.20, Hgb 13.1. 2D Echo 04/28/16 showed mild LVH, EF 55-60%, grade 2 DD, bioprosthesis AV present with trivial AI and normal mean gradient, mildly dilated aortic root and mildly dilated LA. Head CT was negative. Other remote studies include carotid duplex 2014: stable 1-39% BICA and at time of cath 2012 he had mild nonobstructive LAD and RCA stenose otherwise.[name]  He presents back for follow-up today overall feeling well. No recurrent episodes of near-syncope or syncope. No chest pain or SOB. Once every 2 weeks he feels mild brief dizziness upon standing that resolves quickly. He does complain of some occasional right leg tightening that seems to have started after [place] the [place]. He plans to f/u with vascular surgery to further assess but has not yet called since Dr. Kellie [name] retired.  Past Medical History:  Diagnosis Date  . Aortic stenosis    a. severe bicuspid AV stenosis s/p pericardial tissue valve 2012.  . Arthritis   . Coronary artery disease    a. s/p CABG (LIMA-LCx) at time of AVR 2012, mild nonobstructive LAD and RCA stenoses.  . Depression   . Femoral-popliteal bypass graft occlusion, right [facility]) 1997   Dr. Kellie [name]  . GERD  (gastroesophageal reflux disease)   . Headache(784.0)   . Hyperlipidemia   . Hypertension   . 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  [name] PONV (postoperative nausea and vomiting)   . PVD (peripheral vascular disease) ([place])     Past Surgical History:  Procedure Laterality Date  . AORTIC VALVE REPLACEMENT  07/18/2011   Procedure: AORTIC VALVE REPLACEMENT (AVR);  Surgeon: Seth Isaac, MD;  Location: [facility];  Service: Open Heart Surgery;  Laterality: N/A;  . CARDIAC CATHETERIZATION  2009, 07/11/11   Dr. Irish Lack  . CORONARY ARTERY BYPASS GRAFT  07/18/2011   Procedure: CORONARY ARTERY BYPASS GRAFTING (CABG);  Surgeon: Seth Isaac, MD;  Location: [facility] [facility];  Service: Open Heart Surgery;  Laterality: N/A;  times one to mammary artery, left  . FEMORAL ARTERY - POPLITEAL ARTERY BYPASS GRAFT    . KNEE ARTHROSCOPY     left  . MRI     to visualize aortic valve  . ROTATOR CUFF REPAIR     Left  . US ECHOCARDIOGRAPHY      Current Medications: Current Outpatient Prescriptions  Medication Sig Dispense Refill  . Adalimumab (HUMIRA PEN) 40 MG/0.8ML PNKT Inject 40 mg into the skin every 14 (fourteen) days.    [name] aspirin EC 81 MG tablet Take 81 mg by mouth every morning.    [name] atorvastatin (LIPITOR) 20 MG tablet Take 20 mg by mouth daily.    . citalopram (CELEXA)  40 MG tablet Take 40 mg by mouth every evening.     . enalapril (VASOTEC) 5 MG tablet Take 1 tablet (5 mg total) by mouth daily. Please keep 06/29/16 appointment for further refills 30 tablet 3  . lansoprazole (PREVACID) 15 MG capsule Take 15 mg by mouth daily as needed. For acid reflux    . metoprolol (LOPRESSOR) 50 MG tablet Take 1 tablet by mouth 2 (two) times daily.    . tamsulosin (FLOMAX) 0.4 MG CAPS capsule Take 1 capsule by mouth daily.     No current facility-administered medications for this visit.      Allergies:   Codeine and Morphine and related   Social History   Social History  .  Marital status: Married    Spouse name: N/A  . Number of children: N/A  . Years of education: N/A   Occupational History  . [employer]    Social History Main Topics  . Smoking status: Never Smoker  . Smokeless tobacco: Never Used  . Alcohol use No  . Drug use: No  . Sexual activity: Not Asked   Other Topics Concern  . None   Social History Narrative  . None     Family History:  The patient's family history includes Cancer in his father and sister; Healthy in his brother and maternal grandfather; Heart disease in his maternal grandmother.   ROS:   Please see the history of present illness. All other systems are reviewed and otherwise negative.    PHYSICAL EXAM:   VS:  BP 114/74   Pulse 60   Ht 6' (1.829 m)   Wt 230 lb 12.8 oz (104.7 kg)   BMI 31.30 kg/m   BMI: Body mass index is 31.3 kg/m. GEN: Well nourished, well developed WM, in no acute distress  HEENT: normocephalic, atraumatic Neck: no JVD, carotid bruits, or masses Cardiac: RRR; no murmurs, rubs, or gallops, crisp valve sound, no edema  Respiratory:  clear to auscultation bilaterally, normal work of breathing GI: soft, nontender, nondistended, + BS MS: no deformity or atrophy  Skin: warm and dry, no rash Neuro:  Alert and Oriented x 3, Strength and sensation are intact, follows commands Psych: euthymic mood, full affect  Wt Readings from Last 3 Encounters:  05/12/16 230 lb 12.8 oz (104.7 kg)  04/14/16 228 lb 12.8 oz (103.8 kg)  01/21/15 226 lb 12.8 oz (102.9 kg)      Studies/Labs Reviewed:   EKG:  EKG was not ordered today.  Recent Labs: 04/14/2016: BUN 14; Creat 1.20; Hemoglobin 13.1; Platelets 290; Potassium 4.6; Sodium 136   Lipid Panel    Component Value Date/Time   CHOL 136 05/27/2012 0921   TRIG 139.0 05/27/2012 0921   HDL 29.20 (L) 05/27/2012 0921   CHOLHDL 5 05/27/2012 0921   VLDL 27.8 05/27/2012 0921   LDLCALC 79 05/27/2012 0921    Additional studies/ records that  were reviewed today include: Summarized above.    ASSESSMENT & PLAN:   1. Near syncope - suspect due to dehydration. Importance of remaining hydrated in the heat reinforced. We also reviewed the fact that he has diastolic dysfunction on his echo so not to go overboard with hydration and salt, but to remain adequately hydrated with balance. See below re: cutting down BP med. 2. Aortic stenosis s/p AVR - stable by recent echo. Further surveillance per Dr. Burt Knack. 3. CAD - continue aspirin, BB, statin. Importance of regular physical activity reiterated. 4. Essential HTN -  he reports mild symptoms of orthostasis a few times a month. Will cut enalapril in half as his BP is 114/74 today. If orthostasis persists I told him he can stop this completely. F/u BP next visit. 5. Hyperlipidemia - to obtain lipids/LFTs today. If LFTs are OK would consider titration of atorvastatin to guideline-directed moderate-high dose. He denies any prior issues with statins.  Disposition: F/u with Dr. Burt Knack as previously scheduled 06/29/16. The patient also plans to call vascular surgery to get back in to see them regularly.   Medication Adjustments/Labs and Tests Ordered: Current medicines are reviewed at length with the patient today.  Concerns regarding medicines are outlined above. Medication changes, Labs and Tests ordered today are summarized above and listed in the Patient Instructions accessible in Encounters.   Raechel Ache PA-C  05/12/2016 9:01 AM    [address], [place], [place]  [address] Phone: [phone]; Fax: [phone]

## 2016-05-12 ENCOUNTER — Other Ambulatory Visit: Payer: BLUE CROSS/BLUE SHIELD | Admitting: *Deleted

## 2016-05-12 ENCOUNTER — Ambulatory Visit (INDEPENDENT_AMBULATORY_CARE_PROVIDER_SITE_OTHER): Payer: BLUE CROSS/BLUE SHIELD | Admitting: Physician Assistant

## 2016-05-12 ENCOUNTER — Encounter: Payer: Self-pay | Admitting: Physician Assistant

## 2016-05-12 VITALS — BP 114/74 | HR 60 | Ht 72.0 in | Wt 230.8 lb

## 2016-05-12 DIAGNOSIS — E785 Hyperlipidemia, unspecified: Secondary | ICD-10-CM

## 2016-05-12 DIAGNOSIS — I10 Essential (primary) hypertension: Secondary | ICD-10-CM

## 2016-05-12 DIAGNOSIS — I2510 Atherosclerotic heart disease of native coronary artery without angina pectoris: Secondary | ICD-10-CM

## 2016-05-12 DIAGNOSIS — I350 Nonrheumatic aortic (valve) stenosis: Secondary | ICD-10-CM

## 2016-05-12 DIAGNOSIS — R55 Syncope and collapse: Secondary | ICD-10-CM | POA: Diagnosis not present

## 2016-05-12 LAB — HEPATIC FUNCTION PANEL
ALT: 38 U/L (ref 9–46)
AST: 40 U/L — ABNORMAL HIGH (ref 10–35)
Albumin: 3.9 g/dL (ref 3.6–5.1)
Alkaline Phosphatase: 55 U/L (ref 40–115)
Bilirubin, Direct: 0.1 mg/dL (ref <=0.2)
Indirect Bilirubin: 0.4 mg/dL (ref 0.2–1.2)
Total Bilirubin: 0.5 mg/dL (ref 0.2–1.2)
Total Protein: 6.6 g/dL (ref 6.1–8.1)

## 2016-05-12 LAB — LIPID PANEL
Cholesterol: 141 mg/dL (ref 125–200)
HDL: 31 mg/dL — ABNORMAL LOW (ref >=40)
LDL Cholesterol: 82 mg/dL (ref <130)
Total CHOL/HDL Ratio: 4.5 Ratio (ref <=5.0)
Triglycerides: 138 mg/dL (ref <150)
VLDL: 28 mg/dL (ref <30)

## 2016-05-12 MED ORDER — ENALAPRIL MALEATE 5 MG PO TABS: 2.5000 mg | ORAL_TABLET | Freq: Every day | ORAL | 9 refills | Status: DC

## 2016-05-12 NOTE — Patient Instructions (Signed)
Medication Instructions:  Your physician has recommended you make the following change in your medication:  1. Decrease enalapril (2.5) mg.  Can cut you tablet in half.   Labwork: Your physician recommends that you have lab work today: lipid/lft    Testing/Procedures: -None  Follow-Up: Your physician recommends that you keep your scheduled  follow-up appointment with Dr. Burt Knack.    Any Other Special Instructions Will Be Listed Below (If Applicable).     If you need a refill on your cardiac medications before your next appointment, please call your pharmacy.

## 2016-05-16 ENCOUNTER — Telehealth: Payer: Self-pay | Admitting: *Deleted

## 2016-05-16 DIAGNOSIS — E785 Hyperlipidemia, unspecified: Secondary | ICD-10-CM

## 2016-05-16 DIAGNOSIS — I10 Essential (primary) hypertension: Secondary | ICD-10-CM

## 2016-05-16 MED ORDER — ATORVASTATIN CALCIUM 80 MG PO TABS: 80.0000 mg | ORAL_TABLET | Freq: Every day | ORAL | 1 refills | Status: DC

## 2016-05-16 NOTE — Telephone Encounter (Signed)
-----   Message from Charlie Pitter, [place] sent at 05/12/2016  4:10 PM EDT ----- Please let patient know that his cholesterol is not totally controlled. Goal LDL is <70. His HDL is also somewhat low as well. Given his PAD, CAD and valve disease would recommend titration of atorvastatin to 80mg  nightly with recheck liver/lipids in 6 weeks. Dayna Dunn PA-C

## 2016-05-16 NOTE — Telephone Encounter (Signed)
Pt aware of his lab results and that we recommend to increase the Atorvastatin to 80 mg qd and repeat lipid/liver in 6 weeks. Pt agreeable with this plan and will be in 06/26/16 for lab work. Order in [place].

## 2016-05-22 ENCOUNTER — Other Ambulatory Visit: Payer: Self-pay | Admitting: Vascular Surgery

## 2016-05-22 DIAGNOSIS — M79604 Pain in right leg: Secondary | ICD-10-CM

## 2016-06-07 ENCOUNTER — Encounter: Payer: BLUE CROSS/BLUE SHIELD | Admitting: Vascular Surgery

## 2016-06-09 ENCOUNTER — Encounter: Payer: Self-pay | Admitting: Vascular Surgery

## 2016-06-16 ENCOUNTER — Ambulatory Visit (HOSPITAL_COMMUNITY)
Admission: RE | Admit: 2016-06-16 | Discharge: 2016-06-16 | Disposition: A | Discharge status: Home / Self Care | Payer: BLUE CROSS/BLUE SHIELD | Source: Ambulatory Visit | Attending: Vascular Surgery | Admitting: Vascular Surgery

## 2016-06-16 ENCOUNTER — Encounter: Payer: Self-pay | Admitting: Vascular Surgery

## 2016-06-16 ENCOUNTER — Encounter: Payer: BLUE CROSS/BLUE SHIELD | Admitting: Vascular Surgery

## 2016-06-16 ENCOUNTER — Ambulatory Visit (INDEPENDENT_AMBULATORY_CARE_PROVIDER_SITE_OTHER): Payer: BLUE CROSS/BLUE SHIELD | Admitting: Vascular Surgery

## 2016-06-16 ENCOUNTER — Other Ambulatory Visit (HOSPITAL_COMMUNITY): Payer: BLUE CROSS/BLUE SHIELD

## 2016-06-16 ENCOUNTER — Other Ambulatory Visit: Payer: Self-pay

## 2016-06-16 VITALS — BP 104/72 | HR 64 | Temp 99.2°F | Resp 20 | Ht 72.0 in | Wt 229.2 lb

## 2016-06-16 DIAGNOSIS — M79604 Pain in right leg: Secondary | ICD-10-CM | POA: Diagnosis not present

## 2016-06-16 DIAGNOSIS — I70411 Atherosclerosis of autologous vein bypass graft(s) of the extremities with intermittent claudication, right leg: Secondary | ICD-10-CM

## 2016-06-16 DIAGNOSIS — R9439 Abnormal result of other cardiovascular function study: Secondary | ICD-10-CM | POA: Insufficient documentation

## 2016-06-16 NOTE — Progress Notes (Signed)
[facility] [place]  Patient name: [name] MRN: [id number] DOB: February 16, 1951 Sex: male  REASON FOR VISIT: right leg pain  HPI: [name] is a 65 y.o. male, who presents for evaluation of right leg pain. He previously underwent right superficial femoral artery to below-knee popliteal bypass with great saphenous vein by Dr. Kellie Simmering in 1997. His bypass graft occluded in 2008. He was last seen in the office in 2009 where he had stable calf claudication. He was encouraged to continue staying active. Since his last office visit, he reports that his right calf claudication has been the same. He denies any worsening of his symptoms. He is able to walk about 5 minutes before his calf cramps. He officiates football games part-time. He denies any nonhealing wounds or rest pain. He denies any issues with his left leg.  He is back today because he is unable to tolerate the symptoms any further. He has never been a smoker. He is not diabetic. He has had hypertension that is well controlled. He is on a statin for hyperlipidemia. He previously had aortic stenosis status post AVR which is stable by recent echo. He has CAD s/p CABG (LIMA-LCx). He is not on any blood thinners. His mother had peripheral vascular disease.  He had an episode of near syncope while officiating football in 62 heat in September 2017. A head CT scan was negative. It was felt that his symptoms were secondary to dehydration and heat.   He previously had a carotid duplex in 2014 with less than 40% bilateral internal carotid artery stenosis.  Past Medical History:  Diagnosis Date  . Aortic stenosis    a. severe bicuspid AV stenosis s/p pericardial tissue valve 2012.  . Arthritis   . Coronary artery disease    a. s/p CABG (LIMA-LCx) at time of AVR 2012, mild nonobstructive LAD and RCA stenoses.  . Depression   . Femoral-popliteal bypass graft occlusion, right [facility]) 1997   Dr. Kellie Simmering  . GERD  (gastroesophageal reflux disease)   . Headache(784.0)   . Hyperlipidemia   . Hypertension   . 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  [name] PONV (postoperative nausea and vomiting)   . PVD (peripheral vascular disease) (HCC)     Family History  Problem Relation Age of Onset  . Cancer Father   . Cancer Sister   . Healthy Brother   . Heart disease Maternal Grandmother   . Healthy Maternal Grandfather     SOCIAL HISTORY: Social History   Social History  . Marital status: Married    Spouse name: N/A  . Number of children: N/A  . Years of education: N/A   Occupational History  . [employer]    Social History Main Topics  . Smoking status: Never Smoker  . Smokeless tobacco: Never Used  . Alcohol use No  . Drug use: No  . Sexual activity: Not on file   Other Topics Concern  . Not on file   Social History Narrative  . No narrative on file    Allergies  Allergen Reactions  . Codeine Other (See Comments)    Blood drops  . Morphine And Related Itching    Current Outpatient Prescriptions  Medication Sig Dispense Refill  . Adalimumab (HUMIRA PEN) 40 MG/0.8ML PNKT Inject 40 mg into the skin every 14 (fourteen) days.    [name] aspirin EC 81 MG tablet Take 81 mg  by mouth every morning.    [name] atorvastatin (LIPITOR) 80 MG tablet Take 1 tablet (80 mg total) by mouth daily. 90 tablet 1  . citalopram (CELEXA) 40 MG tablet Take 40 mg by mouth every evening.     . enalapril (VASOTEC) 5 MG tablet Take 0.5 tablets (2.5 mg total) by mouth daily. 30 tablet 9  . lansoprazole (PREVACID) 15 MG capsule Take 15 mg by mouth daily as needed. For acid reflux    . metoprolol (LOPRESSOR) 50 MG tablet Take 1 tablet by mouth 2 (two) times daily.    . tamsulosin (FLOMAX) 0.4 MG CAPS capsule Take 1 capsule by mouth daily.     No current facility-administered medications for this visit.     REVIEW OF SYSTEMS:  [X]  denotes positive finding, [ ]  denotes  negative finding Cardiac  Comments:  Chest pain or chest pressure: x Right calf  Shortness of breath upon exertion:    Short of breath when lying flat:    Irregular heart rhythm:        Vascular    Pain in calf, thigh, or hip brought on by ambulation:    Pain in feet at night that wakes you up from your sleep:     Blood clot in your veins:    Leg swelling:         Pulmonary    Oxygen at home:    Productive cough:     Wheezing:         Neurologic    Sudden weakness in arms or legs:     Sudden numbness in arms or legs:     Sudden onset of difficulty speaking or slurred speech:    Temporary loss of vision in one eye:     Problems with dizziness:  x       Gastrointestinal    Blood in stool:     Vomited blood:         Genitourinary    Burning when urinating:     Blood in urine:        Psychiatric    Major depression:         Hematologic    Bleeding problems:    Problems with blood clotting too easily:        Skin    Rashes or ulcers:        Constitutional    Fever or chills:      PHYSICAL EXAM: Vitals:   06/16/16 1427  BP: 104/72  Pulse: 64  Resp: 20  Temp: 99.2 F (37.3 C)  TempSrc: Oral  SpO2: 96%  Weight: 229 lb 3.2 oz (104 kg)  Height: 6' (1.829 m)    GENERAL: The patient is a well-nourished male, in no acute distress. The vital signs are documented above. CARDIAC: There is a regular rate and rhythm. No carotid bruits. VASCULAR: 2+ femoral pulses bilaterally. 2+ left posterior tibial pulse. Non-palpable left dorsalis pedis pulse. Nonpalpable right pedal pulses but right foot is warm and pink. PULMONARY: There is good air exchange bilaterally without wheezing or rales. ABDOMEN: Soft and non-tender.  MUSCULOSKELETAL: There are no major deformities or cyanosis. NEUROLOGIC: No focal weakness or paresthesias are detected. SKIN: There are no ulcers or rashes noted. PSYCHIATRIC: The patient has a normal affect.  DATA:  ABIs 06/16/2016. On the right,  0.67 with monophasic waveforms. On the left, 0.90 with biphasic waveforms.  MEDICAL ISSUES: Right calf claudication status post right SFA to below-knee popliteal bypass in 1997  which has occluded  The patient is unable to tolerate his right calf claudication. He has been managing his symptoms conservatively for the past 9 years. He is on maximal medical management with aspirin and statin. He has never been a smoker.He understands that his lifetime risk of limb loss is less than 5% given he is not a smoker nor is diabetic. Offered continued conservative management versus angiography. He wants to proceed with an arteriogram. He understands that there are risks with the procedure including bleeding, infection, access site complications, embolization, rupture of treated vessel, dissection, possible need for emergent surgical intervention, and possible need for surgical procedures to treat the patient's pathology. He understands these risks and is willing to proceed.  Plan for aortogram with bilateral runoff and possible intervention with Dr. Donzetta Matters. The patient plans to schedule this after the holidays.   Virgina Jock, PA-C [facility] [place]    I have interviewed the patient alongside the PA. 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 Given history of aortic stenosis and coronary disease we will start with angiogram with possible intervention. Any further operative studies will need cardiac clearance.  Deriona Altemose C. Donzetta Matters, MD [facility] [place] Office: [phone] Pager: [phone]

## 2016-06-20 ENCOUNTER — Other Ambulatory Visit: Payer: Self-pay | Admitting: Cardiovascular Disease

## 2016-06-21 ENCOUNTER — Encounter: Payer: BLUE CROSS/BLUE SHIELD | Admitting: Vascular Surgery

## 2016-06-21 ENCOUNTER — Other Ambulatory Visit (HOSPITAL_COMMUNITY): Payer: BLUE CROSS/BLUE SHIELD

## 2016-06-26 ENCOUNTER — Other Ambulatory Visit: Payer: BLUE CROSS/BLUE SHIELD

## 2016-06-29 ENCOUNTER — Ambulatory Visit: Payer: BLUE CROSS/BLUE SHIELD | Admitting: Cardiovascular Disease

## 2016-07-05 ENCOUNTER — Ambulatory Visit: Payer: BLUE CROSS/BLUE SHIELD | Admitting: Cardiology

## 2016-07-10 ENCOUNTER — Encounter: Payer: Self-pay | Admitting: Cardiovascular Disease

## 2016-07-10 ENCOUNTER — Ambulatory Visit (INDEPENDENT_AMBULATORY_CARE_PROVIDER_SITE_OTHER): Payer: BLUE CROSS/BLUE SHIELD | Admitting: Cardiovascular Disease

## 2016-07-10 ENCOUNTER — Ambulatory Visit: Payer: BLUE CROSS/BLUE SHIELD | Admitting: Cardiology

## 2016-07-10 VITALS — BP 116/58 | HR 60 | Ht 72.0 in | Wt 231.6 lb

## 2016-07-10 DIAGNOSIS — I359 Nonrheumatic aortic valve disorder, unspecified: Secondary | ICD-10-CM

## 2016-07-10 DIAGNOSIS — I2510 Atherosclerotic heart disease of native coronary artery without angina pectoris: Secondary | ICD-10-CM | POA: Diagnosis not present

## 2016-07-10 NOTE — Progress Notes (Signed)
Cardiology Office Note Date:  07/10/2016   ID:  [name], DOB 03/10/1951, MRN [id number]  PCP:  Vickii Penna., MD  Cardiologist:  Sherren Mocha, MD    Chief Complaint  Patient presents with  . Aortic Stenosis     History of Present Illness: [name] is a 65 y.o. male who presents for Follow-up evaluation. The patient has a history of severe bicuspid aortic valve stenosis status post pericardial tissue valve replacement in 2012. At the time he underwent CABG with a LIMA to left circumflex graft. The patient also has peripheral arterial disease with history of femoral-pop bypass. His most recent echocardiogram 04/28/2016 demonstrated preserved LV systolic function with an ejection fraction of 55-60%, mild LVH, grade 2 diastolic dysfunction, and normal function of his aortic valve bioprosthesis with a mildly dilated aortic root. He was last seen by [name] in September after a near syncopal episode thought to be related to dehydration and becoming overheated.  The patient continues to have some dizziness with positional changes. He has not had any further episodes of presyncope. He denies chest pain or pressure. He has mild exertional dyspnea which is unchanged over time. He denies edema, orthopnea, or PND. He continues to have issues with right calf claudication. The patient referees football and basketball games. He is limited by his right leg symptoms. If he continues to walk/run after the calf pain occurs, he then developed some burning discomfort in the bottom of his foot. He is scheduled for an angiogram with Dr. Donzetta Matters later this week.  Past Medical History:  Diagnosis Date  . Aortic stenosis    a. severe bicuspid AV stenosis s/p pericardial tissue valve 2012.  . Arthritis   . Coronary artery disease    a. s/p CABG (LIMA-LCx) at time of AVR 2012, mild nonobstructive LAD and RCA stenoses.  . Depression   . Femoral-popliteal bypass graft occlusion, right [facility])  1997   Dr. Kellie Simmering  . GERD (gastroesophageal reflux disease)   . Headache(784.0)   . Hyperlipidemia   . Hypertension   . 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  [name] PONV (postoperative nausea and vomiting)   . PVD (peripheral vascular disease) ([place])     Past Surgical History:  Procedure Laterality Date  . AORTIC VALVE REPLACEMENT  07/18/2011   Procedure: AORTIC VALVE REPLACEMENT (AVR);  Surgeon: Grace Isaac, MD;  Location: [facility];  Service: Open Heart Surgery;  Laterality: N/A;  . CARDIAC CATHETERIZATION  2009, 07/11/11   Dr. Irish Lack  . CORONARY ARTERY BYPASS GRAFT  07/18/2011   Procedure: CORONARY ARTERY BYPASS GRAFTING (CABG);  Surgeon: Grace Isaac, MD;  Location: [facility];  Service: Open Heart Surgery;  Laterality: N/A;  times one to mammary artery, left  . FEMORAL ARTERY - POPLITEAL ARTERY BYPASS GRAFT    . KNEE ARTHROSCOPY     left  . MRI     to visualize aortic valve  . ROTATOR CUFF REPAIR     Left  . US ECHOCARDIOGRAPHY      Current Outpatient Prescriptions  Medication Sig Dispense Refill  . Adalimumab (HUMIRA PEN) 40 MG/0.8ML PNKT Inject 40 mg into the skin every 14 (fourteen) days.    [name] amoxicillin (AMOXIL) 500 MG capsule Take 2,000 mg by mouth as directed. Take 2000 mgs 1 hour prior to dental procedures    . aspirin EC 81 MG tablet Take 81 mg by mouth every morning.    [name]  atorvastatin (LIPITOR) 80 MG tablet Take 80 mg by mouth every evening.    . citalopram (CELEXA) 40 MG tablet Take 40 mg by mouth daily at 12 noon.     . enalapril (VASOTEC) 5 MG tablet Take 2.5 mg by mouth daily.    . fluticasone (FLONASE) 50 MCG/ACT nasal spray Place 1-2 sprays into both nostrils daily as needed for allergies or rhinitis.    [name] ibuprofen (ADVIL,MOTRIN) 200 MG tablet Take 600 mg by mouth every 6 (six) hours as needed for headache or moderate pain.    [name] lansoprazole (PREVACID) 15 MG capsule Take 15 mg by mouth daily as needed. For acid reflux      . metoprolol (LOPRESSOR) 50 MG tablet Take 25 mg by mouth 2 (two) times daily.    . sodium chloride (OCEAN) 0.65 % SOLN nasal spray Place 1 spray into both nostrils as needed for congestion.    . tamsulosin (FLOMAX) 0.4 MG CAPS capsule Take 0.4 mg by mouth daily.      No current facility-administered medications for this visit.     Allergies:   Codeine and Morphine and related   Social History:  The patient  reports that he has never smoked. He has never used smokeless tobacco. He reports that he does not drink alcohol or use drugs.   Family History:  The patient's  family history includes Cancer in his father and sister; Healthy in his brother and maternal grandfather; Heart disease in his maternal grandmother.   ROS:  Please see the history of present illness.  Otherwise, review of systems is positive for blood in stool, .  All other systems are reviewed and negative.   PHYSICAL EXAM: VS:  BP (!) 116/58   Pulse 60   Ht 6' (1.829 m)   Wt 231 lb 9.6 oz (105.1 kg)   BMI 31.41 kg/m  , BMI Body mass index is 31.41 kg/m. GEN: Well nourished, well developed, in no acute distress  HEENT: normal  Neck: no JVD, no masses. No carotid bruits Cardiac: RRR without murmur or gallop                Respiratory:  clear to auscultation bilaterally, normal work of breathing GI: soft, nontender, nondistended, + BS MS: no deformity or atrophy  Ext: no pretibial edema Skin: warm and dry, no rash Neuro:  Strength and sensation are intact Psych: euthymic mood, full affect  EKG:  EKG is not ordered today.  Recent Labs: 04/14/2016: BUN 14; Creat 1.20; Hemoglobin 13.1; Platelets 290; Potassium 4.6; Sodium 136 05/12/2016: ALT 38   Lipid Panel     Component Value Date/Time   CHOL 141 05/12/2016 0854   TRIG 138 05/12/2016 0854   HDL 31 (L) 05/12/2016 0854   CHOLHDL 4.5 05/12/2016 0854   VLDL 28 05/12/2016 0854   LDLCALC 82 05/12/2016 0854      Wt Readings from Last 3 Encounters:  07/10/16  231 lb 9.6 oz (105.1 kg)  06/16/16 229 lb 3.2 oz (104 kg)  05/12/16 230 lb 12.8 oz (104.7 kg)     Cardiac Studies Reviewed: 2D Echo 04-28-2016: Study Conclusions  - Left ventricle: The cavity size was normal. Wall thickness was   increased in a pattern of mild LVH. Systolic function was normal.   The estimated ejection fraction was in the range of 55% to 60%.   Wall motion was normal; there were no regional wall motion   abnormalities. Features are consistent with a pseudonormal left  ventricular filling pattern, with concomitant abnormal relaxation   and increased filling pressure (grade 2 diastolic dysfunction). - Aortic valve: A bioprosthesis was present. There was trivial   regurgitation. - Aortic root: The aortic root was mildly dilated. - Left atrium: The atrium was mildly dilated.  Impressions:  - Normal LV systolic function; grade 2 diastolic dysfunction; mild   LVH; s/p AVR with normal mean gradient (10 mmHg); trace   paravalvular AI; mild LAE; mild TR.  ASSESSMENT AND PLAN: 1.  Aortic valve disease status post bioprosthetic aortic valve replacement: The patient is doing well with NYHA functional class II symptoms. No change in his mild shortness of breath. No signs on exam of volume overload. He does have grade 2 diastolic dysfunction noted by echo. His echo is reviewed and his valve is functioning normally. Plan to see him back in one year.  2. Coronary artery disease, native vessel, without angina: The patient had minor nonobstructive disease of the LAD and right coronary arteries at the time of his heart catheterization. He had bypass of the left circumflex with a LIMA graft. He is having no symptoms of angina.  3. Hyperlipidemia: Treated with atorvastatin 80 mg  4. Essential hypertension: Patient with symptoms of postural hypotension. He continues to have postural symptoms even with reducing enalapril. Advised him to discontinue enalapril and remain on metoprolol 25  mg twice daily. He understands adequate fluid hydration is important.  5. Peripheral arterial disease with lifestyle limiting claudication: Patient is scheduled for an angiogram this week. If he were to require open surgery, he would be at acceptable risk from a cardiac perspective and could proceed on his current medicines.  Current medicines are reviewed with the patient today.  The patient does not have concerns regarding medicines.  Labs/ tests ordered today include:  No orders of the defined types were placed in this encounter.   Disposition:   FU one year  Signed, Sherren Mocha, MD  07/10/2016 10:59 AM    [facility] [place], [place], [place]  [address] Phone: [phone]; Fax: [phone]

## 2016-07-10 NOTE — Patient Instructions (Signed)
Medication Instructions:  Your physician has recommended you make the following change in your medication:  1. STOP Enalapril  Labwork: No new orders.   Testing/Procedures: No new orders.   Follow-Up: Your physician wants you to follow-up in: 1 YEAR with Dr Burt Knack.  You will receive a reminder letter in the mail two months in advance. If you don't receive a letter, please call our office to schedule the follow-up appointment.   Any Other Special Instructions Will Be Listed Below (If Applicable).     If you need a refill on your cardiac medications before your next appointment, please call your pharmacy.

## 2016-07-12 ENCOUNTER — Other Ambulatory Visit: Payer: Self-pay | Admitting: *Deleted

## 2016-07-12 ENCOUNTER — Ambulatory Visit (HOSPITAL_COMMUNITY)
Admission: RE | Admit: 2016-07-12 | Discharge: 2016-07-12 | Disposition: A | Discharge status: Home / Self Care | Payer: BLUE CROSS/BLUE SHIELD | Source: Ambulatory Visit | Attending: Vascular Surgery | Admitting: Vascular Surgery

## 2016-07-12 ENCOUNTER — Encounter (HOSPITAL_COMMUNITY)
Admission: RE | Disposition: A | Discharge status: Home / Self Care | Payer: Self-pay | Source: Ambulatory Visit | Attending: Vascular Surgery

## 2016-07-12 DIAGNOSIS — I70211 Atherosclerosis of native arteries of extremities with intermittent claudication, right leg: Secondary | ICD-10-CM | POA: Diagnosis not present

## 2016-07-12 DIAGNOSIS — Z9862 Peripheral vascular angioplasty status: Secondary | ICD-10-CM

## 2016-07-12 DIAGNOSIS — I739 Peripheral vascular disease, unspecified: Secondary | ICD-10-CM | POA: Diagnosis present

## 2016-07-12 HISTORY — PX: PERIPHERAL VASCULAR CATHETERIZATION: SHX172C

## 2016-07-12 LAB — POCT I-STAT, CHEM 8
BUN: 20 mg/dL (ref 6–20)
Calcium, Ion: 1.2 mmol/L (ref 1.15–1.40)
Chloride: 106 mmol/L (ref 101–111)
Creatinine, Ser: 1.1 mg/dL (ref 0.61–1.24)
Glucose, Bld: 112 mg/dL — ABNORMAL HIGH (ref 65–99)
HCT: 37 % — ABNORMAL LOW (ref 39.0–52.0)
Hemoglobin: 12.6 g/dL — ABNORMAL LOW (ref 13.0–17.0)
Potassium: 3.9 mmol/L (ref 3.5–5.1)
Sodium: 141 mmol/L (ref 135–145)
TCO2: 24 mmol/L (ref 0–100)

## 2016-07-12 LAB — POCT ACTIVATED CLOTTING TIME
Activated Clotting Time: 219 seconds
Activated Clotting Time: 241 seconds
Activated Clotting Time: 175 seconds
Activated Clotting Time: 191 seconds

## 2016-07-12 SURGERY — ABDOMINAL AORTOGRAM W/LOWER EXTREMITY
Anesthesia: LOCAL | Laterality: Right

## 2016-07-12 MED ORDER — HEPARIN (PORCINE) IN NACL 2-0.9 UNIT/ML-% IJ SOLN
INTRAMUSCULAR | Status: AC
  Filled 2016-07-12: qty 1000

## 2016-07-12 MED ORDER — NITROGLYCERIN IN D5W 200-5 MCG/ML-% IV SOLN
INTRAVENOUS | Status: AC
  Filled 2016-07-12: qty 250

## 2016-07-12 MED ORDER — CLOPIDOGREL BISULFATE 300 MG PO TABS
ORAL_TABLET | ORAL | Status: AC
  Administered 2016-07-12: 300 mg via ORAL
  Filled 2016-07-12: qty 1

## 2016-07-12 MED ORDER — MIDAZOLAM HCL 2 MG/2ML IJ SOLN
INTRAMUSCULAR | Status: AC
  Filled 2016-07-12: qty 2

## 2016-07-12 MED ORDER — HEPARIN SODIUM (PORCINE) 1000 UNIT/ML IJ SOLN
INTRAMUSCULAR | Status: DC | PRN
  Administered 2016-07-12: 10000 [IU] via INTRAVENOUS
  Administered 2016-07-12: 3000 [IU] via INTRAVENOUS

## 2016-07-12 MED ORDER — SODIUM CHLORIDE 0.9 % IV SOLN
INTRAVENOUS | Status: DC
  Administered 2016-07-12: 07:00:00 via INTRAVENOUS

## 2016-07-12 MED ORDER — SODIUM CHLORIDE 0.9 % IV SOLN: 1.0000 mL/kg/h | INTRAVENOUS | Status: AC

## 2016-07-12 MED ORDER — OXYCODONE-ACETAMINOPHEN 5-325 MG PO TABS: 1.0000 | ORAL_TABLET | ORAL | Status: DC | PRN

## 2016-07-12 MED ORDER — FENTANYL CITRATE (PF) 100 MCG/2ML IJ SOLN
INTRAMUSCULAR | Status: DC | PRN
  Administered 2016-07-12: 50 ug via INTRAVENOUS
  Administered 2016-07-12: 25 ug via INTRAVENOUS

## 2016-07-12 MED ORDER — VERAPAMIL HCL 2.5 MG/ML IV SOLN
INTRAVENOUS | Status: AC
  Filled 2016-07-12: qty 2

## 2016-07-12 MED ORDER — IODIXANOL 320 MG/ML IV SOLN
INTRAVENOUS | Status: DC | PRN
  Administered 2016-07-12: 130 mL via INTRA_ARTERIAL

## 2016-07-12 MED ORDER — HEPARIN (PORCINE) IN NACL 2-0.9 UNIT/ML-% IJ SOLN
INTRAMUSCULAR | Status: DC | PRN
  Administered 2016-07-12: 1000 mL via INTRA_ARTERIAL

## 2016-07-12 MED ORDER — MORPHINE SULFATE (PF) 4 MG/ML IV SOLN: 2.0000 mg | INTRAVENOUS | Status: DC | PRN

## 2016-07-12 MED ORDER — FENTANYL CITRATE (PF) 100 MCG/2ML IJ SOLN
INTRAMUSCULAR | Status: AC
  Filled 2016-07-12: qty 2

## 2016-07-12 MED ORDER — SODIUM CHLORIDE 0.9 % IV SOLN
INTRAVENOUS | Status: DC | PRN
  Administered 2016-07-12: 10:00:00 via SURGICAL_CAVITY

## 2016-07-12 MED ORDER — MIDAZOLAM HCL 2 MG/2ML IJ SOLN
INTRAMUSCULAR | Status: DC | PRN
  Administered 2016-07-12 (×2): 1 mg via INTRAVENOUS

## 2016-07-12 MED ORDER — HEPARIN SODIUM (PORCINE) 1000 UNIT/ML IJ SOLN
INTRAMUSCULAR | Status: AC
  Filled 2016-07-12: qty 1

## 2016-07-12 MED ORDER — CLOPIDOGREL BISULFATE 75 MG PO TABS: 75.0000 mg | ORAL_TABLET | Freq: Every day | ORAL | 2 refills | Status: DC

## 2016-07-12 MED ORDER — CLOPIDOGREL BISULFATE 300 MG PO TABS
300.0000 mg | ORAL_TABLET | Freq: Once | ORAL | Status: AC
  Administered 2016-07-12: 300 mg via ORAL

## 2016-07-12 MED ORDER — LIDOCAINE HCL (PF) 1 % IJ SOLN
INTRAMUSCULAR | Status: DC | PRN
  Administered 2016-07-12: 10 mL via SUBCUTANEOUS

## 2016-07-12 MED ORDER — LIDOCAINE HCL (PF) 1 % IJ SOLN
INTRAMUSCULAR | Status: AC
  Filled 2016-07-12: qty 30

## 2016-07-12 SURGICAL SUPPLY — 23 items
BALLN IN.PACT DCB 5X120 (BALLOONS) ×3
BALLN STERLING OTW 3X100X150 (BALLOONS) ×3
BALLOON STERLING OTW 3X100X150 (BALLOONS) IMPLANT
CATH OMNI FLUSH 5F 65CM (CATHETERS) ×1 IMPLANT
CATH QUICKCROSS .035X135CM (MICROCATHETER) ×1 IMPLANT
CATH TEMPO 5F RIM 65CM (CATHETERS) ×1 IMPLANT
DCB IN.PACT 5X120 (BALLOONS) IMPLANT
DEVICE CONTINUOUS FLUSH (MISCELLANEOUS) ×1 IMPLANT
DEVICE TORQUE .025-.038 (MISCELLANEOUS) ×1 IMPLANT
DIAMONDBACK CLASSIC OAS 1.5MM (CATHETERS) ×3
GUIDEWIRE ANGLED .035X260CM (WIRE) ×1 IMPLANT
KIT ENCORE 26 ADVANTAGE (KITS) ×1 IMPLANT
KIT MICROINTRODUCER STIFF 5F (SHEATH) ×1 IMPLANT
KIT PV (KITS) ×3 IMPLANT
LUBRICANT VIPERSLIDE CORONARY (MISCELLANEOUS) ×1 IMPLANT
SHEATH HIGHFLEX ANSEL 7FR 55CM (SHEATH) ×1 IMPLANT
SHEATH PINNACLE 5F 10CM (SHEATH) ×1 IMPLANT
SYR MEDRAD MARK V 150ML (SYRINGE) ×3 IMPLANT
SYSTEM DIMNDBCK CLSC OAS 1.5MM (CATHETERS) IMPLANT
TRANSDUCER W/STOPCOCK (MISCELLANEOUS) ×3 IMPLANT
TRAY PV CATH (CUSTOM PROCEDURE TRAY) ×3 IMPLANT
WIRE BENTSON .035X145CM (WIRE) ×1 IMPLANT
WIRE VIPER ADVANCE .017X335CM (WIRE) ×1 IMPLANT

## 2016-07-12 NOTE — H&P (Signed)
     History and Physical Update  The patient was interviewed and re-examined.  The patient's previous History and Physical has been reviewed and is unchanged from the office.   Jaymir Struble C. Donzetta Matters, MD [facility] [place] Office: [phone] Pager: [phone]    07/12/2016, 7:37 AM

## 2016-07-12 NOTE — Discharge Instructions (Signed)
Angiogram, Care After °These instructions give you information about caring for yourself after your procedure. Your doctor may also give you more specific instructions. Call your doctor if you have any problems or questions after your procedure. °Follow these instructions at home: °· Take medicines only as told by your doctor. °· Follow your doctor's instructions about: °¨ Care of the area where the tube was inserted. °¨ Bandage (dressing) changes and removal. °· You may shower 24-48 hours after the procedure or as told by your doctor. °· Do not take baths, swim, or use a hot tub until your doctor approves. °· Every day, check the area where the tube was inserted. Watch for: °¨ Redness, swelling, or pain. °¨ Fluid, blood, or pus. °· Do not apply powder or lotion to the site. °· Do not lift anything that is heavier than 10 lb (4.5 kg) for 5 days or as told by your doctor. °· Ask your doctor when you can: °¨ Return to work or school. °¨ Do physical activities or play sports. °¨ Have sex. °· Do not drive or operate heavy machinery for 24 hours or as told by your doctor. °· Have someone with you for the first 24 hours after the procedure. °· Keep all follow-up visits as told by your doctor. This is important. °Contact a health care provider if: °· You have a fever. °· You have chills. °· You have more bleeding from the area where the tube was inserted. Hold pressure on the area. °· You have redness, swelling, or pain in the area where the tube was inserted. °· You have fluid or pus coming from the area. °Get help right away if: °· You have a lot of pain in the area where the tube was inserted. °· The area where the tube was inserted is bleeding, and the bleeding does not stop after 30 minutes of holding steady pressure on the area. °· The area near or just beyond the insertion site becomes pale, cool, tingly, or numb. °This information is not intended to replace advice given to you by your health care provider. Make  sure you discuss any questions you have with your health care provider. °Document Released: 10/13/2008 Document Revised: 12/23/2015 Document Reviewed: 12/18/2012 °Elsevier Interactive Patient Education © 2017 Elsevier Inc. ° °

## 2016-07-12 NOTE — Progress Notes (Signed)
Site area: left groin Site Prior to Removal:  Level 0 Pressure Applied For: 25 minutes Manual:   yes Patient Status During Pull:  stable Post Pull Site:  Level 0 Post Pull Instructions Given:  yes Post Pull Pulses Present: yes Dressing Applied:  yes Bedrest begins @  1330 Comments:

## 2016-07-12 NOTE — Op Note (Signed)
    Patient name: [name] MRN: [id number] DOB: 17-Nov-1950 Sex: male  07/12/2016 Pre-operative Diagnosis: right lower extremity life-limiting claudication Post-operative diagnosis:  Same Surgeon:  Erlene Quan C. Donzetta Matters, MD Procedure Performed: 1.  Left common femoral US guided cannulation 2.  Aortogram with bilateral lower extremity runoff 3.  Rotational atherectomy right AT 4.  Rotational atherectomy right popliteal artery 5.  Drug coated balloon angioplasty of right popliteal artery with 61mm impact  Indications:  65 year old white male history of a right femoropopliteal bypass for claudication in the 90s. This bypass ultimately occluded. Diffuse inflammatory and has had lifestyle limitation with claudication. He is now indicated for the above procedure and was consented.  Findings: The aortoiliac segments bilaterally are patent and free of disease. He has bilateral above-knee popliteal artery occlusions which reconstitute at the knee. On the right side he has a diseased runoff of the anterior tibial artery with occluded tibial peroneal trunk and runoff via collaterals to the PT and peroneal arteries. On the left side he reconstitutes his below-knee popliteal and has dominant runoff via the posterior tibial artery. Following intervention there is direct in-line flow via the anterior tibial on the right to the level of the foot.   Procedure:  The patient was identified in the holding area and taken to room 8.  The patient was then placed supine on the table and prepped and draped in the usual sterile fashion.  A time out was called.  Ultrasound was used to evaluate the left common femoral artery.  It was patent .  A digital ultrasound image was acquired.  A micropuncture needle was used to access the left common femoral artery under ultrasound guidance.  An 018 wire was advanced without resistance and a micropuncture sheath was placed.  The 018 wire was removed and a benson wire was placed.  The  micropuncture sheath was exchanged for a 5 french sheath.  An omniflush catheter was advanced over the wire to the level of L-1.  An abdominal angiogram was obtained followed by bilateral runoff with above findings. With this the patient was heparinized and we exchanged for long 7 French sheath into the right iliofemoral system. We selected the SFA sheath was advanced. We then used Glidewire and quick cross catheter to cross the occlusion of the above-knee popliteal artery and the stenosis in the anterior tibial artery on the right. Angiogram confirmed we were intraluminal and submental technique was not used. We then exchanged for the 014 wire and performed rotational atherectomy starting with the popliteal artery followed by the anterior tibial artery using diamondback 1.5 classic. A 3 mm balloon was then used 2 angioplasty the anterior tibial artery as well as predilation for the popliteal artery. A 5 mm x 120 drug-coated balloon angioplasty was performed of the popliteal artery. Completion angiogram demonstrated brisk flow without flow limiting dissection all the way to the level of the ankle plantar tibial artery. There was some spasm in the anterior tibial artery. Satisfied wires and balloons were removed and the sheath was pulled into the left iliofemoral system. It will be removed in holding when ACT returned less than 180.  I was present and administered 64 minutes of moderate sedation with fentanyl and Versed.  Dannia Snook C. Donzetta Matters, MD [facility] [place] Office: [phone] Pager: [phone]

## 2016-07-13 ENCOUNTER — Encounter (HOSPITAL_COMMUNITY): Payer: Self-pay | Admitting: Vascular Surgery

## 2016-08-03 ENCOUNTER — Telehealth: Payer: Self-pay

## 2016-08-03 NOTE — Telephone Encounter (Signed)
rec'd phone call from pt. with report of generalized itching since being on Plavix, following Aortogram of 07/12/16.  Denied any visible rash.  Denied any other noticeable adverse effects of Plavix.  Reported he has pain behind the right knee.  Stated the right foot feels better. Asking if he can d/c the Plavix at this point, due to the constant itching? Reported he is taking Benadryl intermittently, and has found it to be helpful; stated "but I can't take Benadryl during the day."  Advised will discuss with Dr. Donzetta Matters and return call to pt. with recommendations.  Agreed.

## 2016-08-04 ENCOUNTER — Telehealth: Payer: Self-pay | Admitting: Cardiovascular Disease

## 2016-08-04 NOTE — Telephone Encounter (Signed)
Dr Burt Knack reviewed information and agreed with change as recommended by Dr Donzetta Matters. I left the pt a voicemail in regards to following instructions about medication changes and contact the office with any other questions or concerns.

## 2016-08-04 NOTE — Telephone Encounter (Signed)
Discussed pt's symptoms with Dr. Donzetta Matters.  Recommended to stop Plavix and to increase ASA to 325 mg qd, in place of Plavix.  Notified pt. of above recommendations.  Verb. Understanding.

## 2016-08-04 NOTE — Telephone Encounter (Signed)
Mr.Spargo is calling because he was recently taken off of his Plavix medication due to it making his skin itch and was prescribed to aspirin, 325 mg once a day. He is calling to get Dr. Antionette Char opinion on whether that is okay. Please call, thanks.

## 2016-08-17 ENCOUNTER — Ambulatory Visit (HOSPITAL_COMMUNITY)
Admission: RE | Admit: 2016-08-17 | Discharge: 2016-08-17 | Disposition: A | Discharge status: Home / Self Care | Payer: BLUE CROSS/BLUE SHIELD | Source: Ambulatory Visit | Attending: Vascular Surgery | Admitting: Vascular Surgery

## 2016-08-17 DIAGNOSIS — Z9862 Peripheral vascular angioplasty status: Secondary | ICD-10-CM | POA: Diagnosis present

## 2016-08-17 DIAGNOSIS — I739 Peripheral vascular disease, unspecified: Secondary | ICD-10-CM | POA: Diagnosis not present

## 2016-08-17 DIAGNOSIS — R9439 Abnormal result of other cardiovascular function study: Secondary | ICD-10-CM | POA: Insufficient documentation

## 2016-08-18 ENCOUNTER — Encounter: Payer: Self-pay | Admitting: Family

## 2016-08-18 ENCOUNTER — Ambulatory Visit (INDEPENDENT_AMBULATORY_CARE_PROVIDER_SITE_OTHER): Payer: BLUE CROSS/BLUE SHIELD | Admitting: Family

## 2016-08-18 ENCOUNTER — Telehealth: Payer: Self-pay | Admitting: Cardiovascular Disease

## 2016-08-18 ENCOUNTER — Encounter: Payer: Self-pay | Admitting: Cardiovascular Disease

## 2016-08-18 ENCOUNTER — Ambulatory Visit (INDEPENDENT_AMBULATORY_CARE_PROVIDER_SITE_OTHER): Payer: BLUE CROSS/BLUE SHIELD | Admitting: Cardiovascular Disease

## 2016-08-18 VITALS — BP 123/80 | HR 81 | Temp 98.6°F | Resp 20 | Ht 72.0 in | Wt 232.0 lb

## 2016-08-18 VITALS — BP 102/80 | HR 83 | Ht 72.0 in | Wt 233.8 lb

## 2016-08-18 DIAGNOSIS — I498 Other specified cardiac arrhythmias: Secondary | ICD-10-CM | POA: Diagnosis not present

## 2016-08-18 DIAGNOSIS — I499 Cardiac arrhythmia, unspecified: Secondary | ICD-10-CM

## 2016-08-18 DIAGNOSIS — I2510 Atherosclerotic heart disease of native coronary artery without angina pectoris: Secondary | ICD-10-CM

## 2016-08-18 DIAGNOSIS — I70411 Atherosclerosis of autologous vein bypass graft(s) of the extremities with intermittent claudication, right leg: Secondary | ICD-10-CM

## 2016-08-18 DIAGNOSIS — Z9862 Peripheral vascular angioplasty status: Secondary | ICD-10-CM

## 2016-08-18 NOTE — Progress Notes (Addendum)
Postoperative Visit   History of Present Illness  [name] is a 66 y.o. male who is s/p Left common femoral US guided cannulation, Aortogram with bilateral lower extremity runoff, Rotational atherectomy right AT, Rotational atherectomy right popliteal artery, and Drug coated balloon angioplasty of right popliteal artery with 2mm impact by Dr. Donzetta Matters on 07-12-16 by for right lower extremity life-limiting claudication. Findings: The aortoiliac segments bilaterally are patent and free of disease. He has bilateral above-knee popliteal artery occlusions which reconstitute at the knee. On the right side he has a diseased runoff of the anterior tibial artery with occluded tibial peroneal trunk and runoff via collaterals to the PT and peroneal arteries. On the left side he reconstitutes his below-knee popliteal and has dominant runoff via the posterior tibial artery. Following intervention there is direct in-line flow via the anterior tibial on the right to the level of the foot.  He previously underwent right superficial femoral artery to below-knee popliteal bypass with great saphenous vein by Dr. Kellie Simmering in 1997. His bypass graft occluded in 2008. Previous to this he was last seen in the office in 2009 where he had stable calf claudication. He was encouraged to continue staying active.   He officiates football games part-time. He denies any nonhealing wounds or rest pain. He denies any issues with his left leg.  He has never been a smoker. He is not diabetic. He has had hypertension that is well controlled. He is on a statin for hyperlipidemia. He previously had aortic stenosis status post AVR which is stable by recent echo. He has CAD s/p CABG (LIMA-LCx). He is not on any blood thinners. His mother had peripheral vascular disease.  He had an episode of near syncope while officiating football in 15 heat in September 2017. A head CT scan was negative. It was felt that his symptoms were  secondary to dehydration and heat.   He previously had a carotid duplex in 2014 with less than 40% bilateral internal carotid artery stenosis.  He is walking about 30 minutes, 3x/week; he denies any more claudication in his right calf, denies any further right foot burning sensation.  His left knee pain limits his walking now. He had a meniscus tear repaired in the 1990's, left knee.   He denies dizziness, denies dyspnea, denies feeling any more tired than usual, denies chest pain.   The patient is able to complete their activities of daily living.     Past Medical History:  Diagnosis Date  . Aortic stenosis    a. severe bicuspid AV stenosis s/p pericardial tissue valve 2012.  . Arthritis   . Coronary artery disease    a. s/p CABG (LIMA-LCx) at time of AVR 2012, mild nonobstructive LAD and RCA stenoses.  . Depression   . Femoral-popliteal bypass graft occlusion, right [facility]) 1997   Dr. Kellie Simmering  . GERD (gastroesophageal reflux disease)   . Headache(784.0)   . Hyperlipidemia   . Hypertension   . 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  [name] PONV (postoperative nausea and vomiting)   . PVD (peripheral vascular disease) ([place])     Past Surgical History:  Procedure Laterality Date  . AORTIC VALVE REPLACEMENT  07/18/2011   Procedure: AORTIC VALVE REPLACEMENT (AVR);  Surgeon: Grace Isaac, MD;  Location: [place];  Service: Open Heart Surgery;  Laterality: N/A;  . CARDIAC CATHETERIZATION  2009, 07/11/11   Dr. Irish Lack  . CORONARY ARTERY BYPASS  GRAFT  07/18/2011   Procedure: CORONARY ARTERY BYPASS GRAFTING (CABG);  Surgeon: Grace Isaac, MD;  Location: [place];  Service: Open Heart Surgery;  Laterality: N/A;  times one to mammary artery, left  . FEMORAL ARTERY - POPLITEAL ARTERY BYPASS GRAFT    . KNEE ARTHROSCOPY     left  . MRI     to visualize aortic valve  . PERIPHERAL VASCULAR CATHETERIZATION N/A 07/12/2016   Procedure: Abdominal Aortogram  w/Lower Extremity;  Surgeon: Waynetta Sandy, MD;  Location: [place] [facility];  Service: Cardiovascular;  Laterality: N/A;  . PERIPHERAL VASCULAR CATHETERIZATION Right 07/12/2016   Procedure: Peripheral Vascular Atherectomy;  Surgeon: Waynetta Sandy, MD;  Location: [place] [facility];  Service: Cardiovascular;  Laterality: Right;  Anterior Tibial and Popliteal  . ROTATOR CUFF REPAIR     Left  . US ECHOCARDIOGRAPHY      Social History   Social History  . Marital status: Married    Spouse name: N/A  . Number of children: N/A  . Years of education: N/A   Occupational History  . [employer]    Social History Main Topics  . Smoking status: Never Smoker  . Smokeless tobacco: Never Used  . Alcohol use No  . Drug use: No  . Sexual activity: Not on file   Other Topics Concern  . Not on file   Social History Narrative  . No narrative on file     Family History  Problem Relation Age of Onset  . Cancer Father   . Cancer Sister   . Healthy Brother   . Heart disease Maternal Grandmother   . Healthy Maternal Grandfather     Current Outpatient Prescriptions on File Prior to Visit  Medication Sig Dispense Refill  . Adalimumab (HUMIRA PEN) 40 MG/0.8ML PNKT Inject 40 mg into the skin every 14 (fourteen) days.    [name] aspirin EC 81 MG tablet Take 81 mg by mouth every morning.    [name] atorvastatin (LIPITOR) 80 MG tablet Take 80 mg by mouth every evening.    . citalopram (CELEXA) 40 MG tablet Take 40 mg by mouth daily at 12 noon.     . fluticasone (FLONASE) 50 MCG/ACT nasal spray Place 1-2 sprays into both nostrils daily as needed for allergies or rhinitis.    [name] ibuprofen (ADVIL,MOTRIN) 200 MG tablet Take 600 mg by mouth every 6 (six) hours as needed for headache or moderate pain.    [name] lansoprazole (PREVACID) 15 MG capsule Take 15 mg by mouth daily as needed. For acid reflux    . metoprolol (LOPRESSOR) 50 MG tablet Take 25 mg by mouth 2 (two) times daily.      . tamsulosin (FLOMAX) 0.4 MG CAPS capsule Take 0.4 mg by mouth daily.     [name] amoxicillin (AMOXIL) 500 MG capsule Take 2,000 mg by mouth as directed. Take 2000 mgs 1 hour prior to dental procedures    . sodium chloride (OCEAN) 0.65 % SOLN nasal spray Place 1 spray into both nostrils as needed for congestion.     No current facility-administered medications on file prior to visit.     Allergies  Allergen Reactions  . Codeine Other (See Comments)    Blood pressure drops  . Morphine And Related Itching  . Plavix [Clopidogrel] Itching     For [facility] Use Only  PRE-ADM LIVING: Home  AMB STATUS: Ambulatory  Physical Examination  Vitals:   08/18/16 1344 08/18/16 1349  BP: 113/76 123/80  Pulse: 81   Resp: 20   Temp: 98.6 F (37 C)   TempSrc: Oral   SpO2: 95%   Weight: 232 lb (105.2 kg)   Height: 6' (1.829 m)    Body mass index is 31.46 kg/m.  PHYSICAL EXAMINATION: General: The patient appears their stated age, obese male.  HEENT:  No gross abnormalities Pulmonary: Respirations are non-labored, CTAB, good air movement in all fields. Abdomen: Soft and non-tender with normal pitched bowel sounds.  Musculoskeletal: There are no major deformities.   Neurologic: No focal weakness or paresthesias are detected. CN 2-12 intact.  Skin: There are no ulcer or rashes noted. Psychiatric: The patient has normal affect. Cardiovascular: There is an irregular rhythm, regular rate, without significant murmur appreciated.   Vascular: Vessel Right Left  Radial Palpable Palpable  Carotid Palpable, without bruit Palpable, without bruit  Aorta Not palpable N/A  Femoral Palpable Not Palpable  Popliteal Not palpable Not palpable  PT not Palpable not Palpable  DP not Palpable faintly Palpable    DATA ABI's 08-18-16: Right: 0.82 (PT), 0.72 (DP), triphasic waveforms (0.73 on 06-16-16 with monophasic waveforms); TBI: 0.45 (was 0.42) Left: 0.83 (PT, bipahsic), 0.63 (DP, monophasic), (0.90 on  06-16-16 with biphasic waveforms); TBI: 0.53 (was 0.46) Improvement in right ABI since the angioplasty, slight decline in left ABI. Bilateral TBI's are stable.   Medical Decision Making  [name] is a 66 y.o. male who presents s/p Aortogram with bilateral lower extremity runoff, Rotational atherectomy right AT, Rotational atherectomy right popliteal artery, and Drug coated balloon angioplasty of right popliteal artery with 35mm impact by Dr. Donzetta Matters on 07-12-16 by for right lower extremity life-limiting claudication. His right LE claudication symptoms have resolved since the above procedure. He has no other claudication symptoms with walking, no signs of ischemia in his feet/legs.  Based on ABI results, HPI, and physical exam, return in 3 months with ABI's; I advised him to notify us if he develops pain or weakness in the muscles of his legs with walking, or develops wounds in his feet/legs that are slow to heal.   His atherosclerotic risk factors include CAD, family history of PAD, and obesity. Fortunately he does not have DM and never used tobacco.   Graduated walking program discussed and how to achieve, but since his walking is limited by his left knee pain, have instructed in daily seated leg exercises. Senior water aerobics also advised if available to him.   I sent a message to Dr. Burt Knack re pt's asymptomatic cardiac arrhythmia today in our office; pt has no history of cardiac arrhythmia, is not taking an anticoagulant. He is taking 325 mg daily ASA and a statin.  At pt's visit with Dr. Burt Knack in December 2017, review of Dr. Antionette Char note indicates pt's cardiac rhythm was regular.    I discussed in depth with the patient the nature of atherosclerosis, and emphasized the importance of maximal medical management including strict control of blood pressure, blood glucose, and lipid levels, obtaining regular exercise, and continued cessation of smoking.  The patient is aware that without  maximal medical management the underlying atherosclerotic disease process will progress, limiting the benefit of any interventions.   Thank you for allowing [place] to participate in this patient's care.  Kian Ottaviano, Sharmon Leyden, RN, MSN, FNP-C [facility] Office: [phone]  08/18/2016, 1:53 PM  Clinic MD: Dickson/Chen

## 2016-08-18 NOTE — Patient Instructions (Signed)
Medication Instructions:  Your physician recommends that you continue on your current medications as directed. Please refer to the Current Medication list given to you today.  Labwork: No new orders.   Testing/Procedures: No new orders.   Follow-Up: Your physician wants you to follow-up in: December 2018 with Dr Burt Knack.  You will receive a reminder letter in the mail two months in advance. If you don't receive a letter, please call our office to schedule the follow-up appointment.   Any Other Special Instructions Will Be Listed Below (If Applicable).     If you need a refill on your cardiac medications before your next appointment, please call your pharmacy.

## 2016-08-18 NOTE — Patient Instructions (Signed)

## 2016-08-18 NOTE — Telephone Encounter (Signed)
I spoke with pt and he was seen at [facility] today and they told him he had an irregular heart beat and he should contact Cardiology immediately.  I reviewed last EKG in chart and the pt does have a sinus arrhythmia.  The pt is anxious and would like to be evaluated.  I have added the pt to Dr Antionette Char schedule today.

## 2016-08-18 NOTE — Progress Notes (Signed)
Cardiology Office Note Date:  08/18/2016   ID:  [name], DOB 1951/02/15, MRN [id number]  PCP:  Vickii Penna., MD  Cardiologist:  Sherren Mocha, MD    Chief Complaint  Patient presents with  . Irregular Heart Beat    History of Present Illness: [name] is a 66 y.o. male who presents for evaluation of an irregular heart beat. The patient has a history of severe bicuspid aortic valve stenosis status post pericardial tissue valve replacement in 2012. At the time he underwent CABG with a LIMA to left circumflex graft. The patient also has peripheral arterial disease with history of femoral-pop bypass. His most recent echocardiogram 04/28/2016 demonstrated preserved LV systolic function with an ejection fraction of 55-60%, mild LVH, grade 2 diastolic dysfunction, and normal function of his aortic valve bioprosthesis with a mildly dilated aortic root.  He was seen for routine FU in the vascular surgery office by Clemon Chambers, NP today. He was noted to have an irregular heart rhythm. He was advised to FU with cardiology to make sure he wasn't in atrial fibrillation or another arrhythmia. He was just seen last month and was doing well from a cardiac perspective. Today, he denies symptoms of palpitations, chest pain, shortness of breath, orthopnea, PND, lower extremity edema, dizziness, or syncope.  Since his antihypertensive medications were reduced, dizziness has resolved. His right leg pain has resolved since he underwent endovascular intervention by Dr Donzetta Matters.   Past Medical History:  Diagnosis Date  . Aortic stenosis    a. severe bicuspid AV stenosis s/p pericardial tissue valve 2012.  . Arthritis   . Coronary artery disease    a. s/p CABG (LIMA-LCx) at time of AVR 2012, mild nonobstructive LAD and RCA stenoses.  . Depression   . Femoral-popliteal bypass graft occlusion, right [facility]) 1997   Dr. Kellie Simmering  . GERD (gastroesophageal reflux disease)   . Headache(784.0)   .  Hyperlipidemia   . Hypertension   . 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  [name] PONV (postoperative nausea and vomiting)   . PVD (peripheral vascular disease) ([place])     Past Surgical History:  Procedure Laterality Date  . AORTIC VALVE REPLACEMENT  07/18/2011   Procedure: AORTIC VALVE REPLACEMENT (AVR);  Surgeon: Grace Isaac, MD;  Location: [place];  Service: Open Heart Surgery;  Laterality: N/A;  . CARDIAC CATHETERIZATION  2009, 07/11/11   Dr. Irish Lack  . CORONARY ARTERY BYPASS GRAFT  07/18/2011   Procedure: CORONARY ARTERY BYPASS GRAFTING (CABG);  Surgeon: Grace Isaac, MD;  Location: [place];  Service: Open Heart Surgery;  Laterality: N/A;  times one to mammary artery, left  . FEMORAL ARTERY - POPLITEAL ARTERY BYPASS GRAFT    . KNEE ARTHROSCOPY     left  . MRI     to visualize aortic valve  . PERIPHERAL VASCULAR CATHETERIZATION N/A 07/12/2016   Procedure: Abdominal Aortogram w/Lower Extremity;  Surgeon: Waynetta Sandy, MD;  Location: [place] [facility];  Service: Cardiovascular;  Laterality: N/A;  . PERIPHERAL VASCULAR CATHETERIZATION Right 07/12/2016   Procedure: Peripheral Vascular Atherectomy;  Surgeon: Waynetta Sandy, MD;  Location: [place] [facility];  Service: Cardiovascular;  Laterality: Right;  Anterior Tibial and Popliteal  . ROTATOR CUFF REPAIR     Left  . US ECHOCARDIOGRAPHY      Current Outpatient Prescriptions  Medication Sig Dispense Refill  . Adalimumab (HUMIRA PEN) 40 MG/0.8ML PNKT Inject  40 mg into the skin every 14 (fourteen) days.    [name] amoxicillin (AMOXIL) 500 MG capsule Take 2,000 mg by mouth as directed. Take 2000 mgs 1 hour prior to dental procedures    . aspirin EC 81 MG tablet Take 81 mg by mouth every morning.    [name] atorvastatin (LIPITOR) 80 MG tablet Take 80 mg by mouth every evening.    . citalopram (CELEXA) 40 MG tablet Take 40 mg by mouth daily at 12 noon.     . fluticasone (FLONASE) 50  MCG/ACT nasal spray Place 1-2 sprays into both nostrils daily as needed for allergies or rhinitis.    [name] ibuprofen (ADVIL,MOTRIN) 200 MG tablet Take 600 mg by mouth every 6 (six) hours as needed for headache or moderate pain.    [name] lansoprazole (PREVACID) 15 MG capsule Take 15 mg by mouth daily as needed. For acid reflux    . metoprolol (LOPRESSOR) 50 MG tablet Take 25 mg by mouth 2 (two) times daily.    . sodium chloride (OCEAN) 0.65 % SOLN nasal spray Place 1 spray into both nostrils as needed for congestion.    . tamsulosin (FLOMAX) 0.4 MG CAPS capsule Take 0.4 mg by mouth daily.      No current facility-administered medications for this visit.     Allergies:   Codeine; Morphine and related; and Plavix [clopidogrel]   Social History:  The patient  reports that he has never smoked. He has never used smokeless tobacco. He reports that he does not drink alcohol or use drugs.   Family History:  The patient's  family history includes Cancer in his father and sister; Healthy in his brother and maternal grandfather; Heart disease in his maternal grandmother.    ROS:  Please see the history of present illness.  All other systems are reviewed and negative.    PHYSICAL EXAM: VS:  BP 102/80 (BP Location: Left Arm, Patient Position: Sitting, Cuff Size: Normal)   Pulse 83   Ht 6' (1.829 m)   Wt 233 lb 12.8 oz (106.1 kg)   SpO2 96%   BMI 31.71 kg/m  , BMI Body mass index is 31.71 kg/m. GEN: Well nourished, well developed, in no acute distress  HEENT: normal  Neck: no JVD, no masses. Cardiac: RRR without murmur or gallop                Respiratory:  clear to auscultation bilaterally, normal work of breathing GI: soft, nontender, nondistended, + BS MS: no deformity or atrophy  Ext: no pretibial edema Skin: warm and dry, no rash Neuro:  Strength and sensation are intact Psych: euthymic mood, full affect  EKG:  EKG is ordered today. The ekg ordered today shows NSR with sinus arrhythmia, HR  83 bpm, cannot rule out anterior infarct age undetermined.   Recent Labs: 04/14/2016: Platelets 290 05/12/2016: ALT 38 07/12/2016: BUN 20; Creatinine, Ser 1.10; Hemoglobin 12.6; Potassium 3.9; Sodium 141   Lipid Panel     Component Value Date/Time   CHOL 141 05/12/2016 0854   TRIG 138 05/12/2016 0854   HDL 31 (L) 05/12/2016 0854   CHOLHDL 4.5 05/12/2016 0854   VLDL 28 05/12/2016 0854   LDLCALC 82 05/12/2016 0854      Wt Readings from Last 3 Encounters:  08/18/16 233 lb 12.8 oz (106.1 kg)  08/18/16 232 lb (105.2 kg)  07/12/16 232 lb (105.2 kg)     ASSESSMENT AND PLAN: Sinus arrhythmia: reviewed that this is a benign heart  rhythm and unchanged from his previous EKG's. He has no cardiopulmonary symptoms at present and will continue on current medications. All other cardiac problems are stable - see office note 07-10-2016. He is reassured regarding today's findings and will keep his annual FU as planned in December.   Current medicines are reviewed with the patient today.  The patient does not have concerns regarding medicines.  Labs/ tests ordered today include:   Orders Placed This Encounter  Procedures  . EKG 12-Lead    Disposition:   FU as planned in December 2018.   Deatra James, MD  08/18/2016 5:24 PM    [address], [place], [place]  [address] Phone: [phone]; Fax: [phone]

## 2016-08-18 NOTE — Telephone Encounter (Signed)
New Message;    Please call,just left one of his other doctors, They said he had an irregular heartbeat and told him to contact his Cardiologist.

## 2016-08-23 ENCOUNTER — Ambulatory Visit (HOSPITAL_COMMUNITY): Payer: BLUE CROSS/BLUE SHIELD

## 2016-08-24 NOTE — Addendum Note (Signed)
Addended by: Ceaira Ernster A on: 07/04/2017 04:22 PM   Modules accepted: Orders  

## 2016-08-25 ENCOUNTER — Encounter (HOSPITAL_COMMUNITY): Payer: BLUE CROSS/BLUE SHIELD

## 2016-08-25 ENCOUNTER — Encounter: Payer: BLUE CROSS/BLUE SHIELD | Admitting: Family

## 2016-09-29 ENCOUNTER — Encounter (HOSPITAL_COMMUNITY): Payer: Self-pay | Admitting: *Deleted

## 2016-11-06 ENCOUNTER — Encounter: Payer: Self-pay | Admitting: Family

## 2016-11-17 ENCOUNTER — Ambulatory Visit (HOSPITAL_COMMUNITY)
Admission: RE | Admit: 2016-11-17 | Discharge: 2016-11-17 | Disposition: A | Discharge status: Home / Self Care | Payer: BLUE CROSS/BLUE SHIELD | Source: Ambulatory Visit | Attending: Family | Admitting: Family

## 2016-11-17 ENCOUNTER — Ambulatory Visit (INDEPENDENT_AMBULATORY_CARE_PROVIDER_SITE_OTHER): Payer: BLUE CROSS/BLUE SHIELD | Admitting: Family

## 2016-11-17 ENCOUNTER — Encounter: Payer: Self-pay | Admitting: Family

## 2016-11-17 VITALS — BP 104/70 | HR 80 | Temp 97.8°F | Resp 18 | Ht 72.0 in | Wt 233.0 lb

## 2016-11-17 DIAGNOSIS — I70411 Atherosclerosis of autologous vein bypass graft(s) of the extremities with intermittent claudication, right leg: Secondary | ICD-10-CM | POA: Diagnosis not present

## 2016-11-17 DIAGNOSIS — I779 Disorder of arteries and arterioles, unspecified: Secondary | ICD-10-CM

## 2016-11-17 DIAGNOSIS — R9439 Abnormal result of other cardiovascular function study: Secondary | ICD-10-CM | POA: Insufficient documentation

## 2016-11-17 DIAGNOSIS — Z9862 Peripheral vascular angioplasty status: Secondary | ICD-10-CM | POA: Diagnosis not present

## 2016-11-17 NOTE — Patient Instructions (Signed)

## 2016-11-17 NOTE — Progress Notes (Signed)
[facility] [place]   CC: Follow up peripheral artery occlusive disease  History of Present Illness [name] is a 66 y.o. male who is s/p rotational atherectomy right AT, rotational atherectomy right popliteal artery, and drug coated balloon angioplasty of right popliteal artery with 47mm impact by Dr. Donzetta Matters on 07-12-16 by for right lower extremity life-limiting claudication. Findings: The aortoiliac segments bilaterally are patent and free of disease. He has bilateral above-knee popliteal artery occlusions which reconstitute at the knee. On the right side he has a diseased runoff of the anterior tibial artery with occluded tibial peroneal trunk and runoff via collaterals to the PT and peroneal arteries. On the left side he reconstitutes his below-knee popliteal and has dominant runoff via the posterior tibial artery. Following intervention there is direct in-line flow via the anterior tibial on the right to the level of the foot.  He previously underwent right superficial femoral artery to below-knee popliteal bypass with great saphenous vein by Dr. Kellie Simmering in 1997. His bypass graft occluded in 2008. Previous to this he was last seen in the office in 2009 where he had stable calf claudication. He was encouraged to continue staying active.   He officiatesfootball games part-time. He denies any nonhealing wounds or rest pain. He denies any issues with his left leg.  He has never been a smoker. He is not diabetic. He has had hypertension that is well controlled. He is on a statin for hyperlipidemia. He previously had aortic stenosis status post AVR which is stable by recent echo. He has CAD s/p CABG (LIMA-LCx).He is not on any blood thinners. His mother had peripheral vascular disease.  He had an episode of near syncope while officiating football in 49 heat in September 2017. A head CT scan was negative. It was felt that his symptoms were secondary to dehydration and  heat.   He previously had a carotid duplex in 2014 with less than 40% bilateral internal carotid artery stenosis.  After about walking 1/4 mile his right calf cramps, them resolves with more walking.  He is walking about 30-40 minutes, 3-4x/week; denies any further right foot burning sensation. He is also riding his bicycle.  His left knee pain limits his walking now. He had a meniscus tear repaired in the 1990's, left knee.   He denies dizziness, denies dyspnea, denies feeling any more tired than usual, denies chest pain.   He was evaluated by Dr. Burt Knack in January 2018 for asymptomatic cardiac arrhythmia, sinus arrhythmia was demonstrated on EKG; he apparently has a history of asymptomatic sinus arrhythmia.   He denies any known history of stroke or TIA.   Pt Diabetic: No Pt smoker: stopped smokeless tobacco in his late 20's, never smoked  Pt meds include: Statin :Yes Betablocker: Yes ASA: Yes, 325 mg daily Other anticoagulants/antiplatelets: no, Plavix caused itching  Past Medical History:  Diagnosis Date  . Aortic stenosis    a. severe bicuspid AV stenosis s/p pericardial tissue valve 2012.  . Arthritis   . Coronary artery disease    a. s/p CABG (LIMA-LCx) at time of AVR 2012, mild nonobstructive LAD and RCA stenoses.  . Depression   . Femoral-popliteal bypass graft occlusion, right [facility]) 1997   Dr. Kellie Simmering  . GERD (gastroesophageal reflux disease)   . Headache(784.0)   . Hyperlipidemia   . Hypertension   . 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  [name] PONV (postoperative nausea and vomiting)   .  PVD (peripheral vascular disease) ([place])     Social History Social History  Substance Use Topics  . Smoking status: Never Smoker  . Smokeless tobacco: Never Used  . Alcohol use No    Family History Family History  Problem Relation Age of Onset  . Cancer Father   . Cancer Sister   . Healthy Brother   . Heart disease Maternal  Grandmother   . Healthy Maternal Grandfather     Past Surgical History:  Procedure Laterality Date  . AORTIC VALVE REPLACEMENT  07/18/2011   Procedure: AORTIC VALVE REPLACEMENT (AVR);  Surgeon: Grace Isaac, MD;  Location: [place];  Service: Open Heart Surgery;  Laterality: N/A;  . CARDIAC CATHETERIZATION  2009, 07/11/11   Dr. Irish Lack  . CORONARY ARTERY BYPASS GRAFT  07/18/2011   Procedure: CORONARY ARTERY BYPASS GRAFTING (CABG);  Surgeon: Grace Isaac, MD;  Location: [place];  Service: Open Heart Surgery;  Laterality: N/A;  times one to mammary artery, left  . FEMORAL ARTERY - POPLITEAL ARTERY BYPASS GRAFT    . KNEE ARTHROSCOPY     left  . MRI     to visualize aortic valve  . PERIPHERAL VASCULAR CATHETERIZATION N/A 07/12/2016   Procedure: Abdominal Aortogram w/Lower Extremity;  Surgeon: Waynetta Sandy, MD;  Location: [place] [facility];  Service: Cardiovascular;  Laterality: N/A;  . PERIPHERAL VASCULAR CATHETERIZATION Right 07/12/2016   Procedure: Peripheral Vascular Atherectomy;  Surgeon: Waynetta Sandy, MD;  Location: [place] [facility];  Service: Cardiovascular;  Laterality: Right;  Anterior Tibial and Popliteal  . ROTATOR CUFF REPAIR     Left  . US ECHOCARDIOGRAPHY      Allergies  Allergen Reactions  . Codeine Other (See Comments)    Blood pressure drops  . Morphine And Related Itching  . Plavix [Clopidogrel] Itching    Current Outpatient Prescriptions  Medication Sig Dispense Refill  . Adalimumab (HUMIRA PEN) 40 MG/0.8ML PNKT Inject 40 mg into the skin every 14 (fourteen) days.    [name] amoxicillin (AMOXIL) 500 MG capsule Take 2,000 mg by mouth as directed. Take 2000 mgs 1 hour prior to dental procedures    . aspirin EC 81 MG tablet Take 81 mg by mouth every morning.    [name] atorvastatin (LIPITOR) 80 MG tablet Take 80 mg by mouth every evening.    . citalopram (CELEXA) 40 MG tablet Take 40 mg by mouth daily at 12 noon.     . fluticasone (FLONASE) 50  MCG/ACT nasal spray Place 1-2 sprays into both nostrils daily as needed for allergies or rhinitis.    [name] ibuprofen (ADVIL,MOTRIN) 200 MG tablet Take 600 mg by mouth every 6 (six) hours as needed for headache or moderate pain.    [name] lansoprazole (PREVACID) 15 MG capsule Take 15 mg by mouth daily as needed. For acid reflux    . metoprolol (LOPRESSOR) 50 MG tablet Take 25 mg by mouth 2 (two) times daily.    . sodium chloride (OCEAN) 0.65 % SOLN nasal spray Place 1 spray into both nostrils as needed for congestion.    . tamsulosin (FLOMAX) 0.4 MG CAPS capsule Take 0.4 mg by mouth daily.      No current facility-administered medications for this visit.     ROS: See HPI for pertinent positives and negatives.   Physical Examination  Vitals:   11/17/16 1145  BP: 104/70  Pulse: 80  Resp: 18  Temp: 97.8 F (36.6 C)  TempSrc: Oral  SpO2: 96%  Weight: 233 lb (105.7 kg)  Height: 6' (1.829 m)   Body mass index is 31.6 kg/m.  General: The patient appears their stated age, obese male.  HEENT:  No gross abnormalities Pulmonary: Respirations are non-labored, CTAB, good air movement in all fields. Abdomen: Soft and non-tender with normal pitched bowel sounds.  Musculoskeletal: There are no major deformities.   Neurologic: No focal weakness or paresthesias are detected. CN 2-12 intact.  Skin: There are no ulcer or rashes noted. Psychiatric: The patient has normal affect. Cardiovascular: There is an irregular rhythm, regular rate, without significant murmur appreciated.   Vascular: Vessel Right Left  Radial Palpable Palpable  Carotid Palpable, without bruit Palpable, without bruit  Aorta Not palpable N/A  Femoral Palpable Not Palpable  Popliteal Not palpable Not palpable  PT not Palpable not Palpable  DP not Palpable faintly Palpable      Non-Invasive Vascular Imaging: DATE: 11/17/2016 ABI:  RIGHT: 0.89 (0.82, 08-17-16), Waveforms: biphasic; TBI: 0.56 (0.45, 08-17-16)   LEFT: 0.97  (0.82, 08-17-16), Waveforms: triphasic, TBI: 0.64 (0.53, 08-17-16) Bilateral ABI and TBI improved in the last 3 months  ASSESSMENT: [name] is a 66 y.o. male who is s/p rotational atherectomy right AT, rotational atherectomy right popliteal artery, and drug coated balloon angioplasty of right popliteal artery with 69mm impact by Dr. Donzetta Matters on 07-12-16 by for right lower extremity life-limiting claudication. His right LE claudication symptoms have resolved since the above procedure. He has no other claudication symptoms with walking, no signs of ischemia in his feet/legs.   His atherosclerotic risk factors include CAD, family history of PAD, and obesity. Fortunately he does not have DM and never used tobacco.   Graduated walking program discussed and how to achieve, but since his walking is limited by his left knee pain, have instructed in daily seated leg exercises. Senior water aerobics also advised if available to him.   Based on ABI results, HPI, and physical exam, he is advised to return in 6 months with ABI's; I advised him to notify us if he develops pain or weakness in the muscles of his legs with walking, or develops wounds in his feet/legs that are slow to heal.   I discussed in depth with the patient the nature of atherosclerosis, and emphasized the importance of maximal medical management including strict control of blood pressure, blood glucose, and lipid levels, obtaining regular exercise, and continued cessation of smoking.  The patient is aware that without maximal medical management the underlying atherosclerotic disease process will progress, limiting the benefit of any interventions.  The patient was given information about PAD including signs, symptoms, treatment, what symptoms should prompt the patient to seek immediate medical care, and risk reduction measures to take.  Clemon Chambers, RN, MSN, FNP-C [facility] Phone:  [phone]  Clinic MD: Donzetta Matters  11/17/16 11:52 AM

## 2016-11-20 NOTE — Addendum Note (Signed)
Addended by: Lianne Cure A on: 11/20/2016 09:33 AM   Modules accepted: Orders

## 2016-11-30 ENCOUNTER — Other Ambulatory Visit: Payer: Self-pay | Admitting: *Deleted

## 2016-11-30 ENCOUNTER — Other Ambulatory Visit: Payer: Self-pay | Admitting: Physician Assistant

## 2016-11-30 MED ORDER — METOPROLOL TARTRATE 50 MG PO TABS: 25.0000 mg | ORAL_TABLET | Freq: Two times a day (BID) | ORAL | 2 refills | Status: DC

## 2016-12-01 ENCOUNTER — Telehealth: Payer: Self-pay | Admitting: Cardiovascular Disease

## 2016-12-01 DIAGNOSIS — R0601 Orthopnea: Secondary | ICD-10-CM

## 2016-12-01 DIAGNOSIS — R0609 Other forms of dyspnea: Secondary | ICD-10-CM

## 2016-12-01 MED ORDER — POTASSIUM CHLORIDE ER 10 MEQ PO TBCR: 10.0000 meq | EXTENDED_RELEASE_TABLET | Freq: Every day | ORAL | 3 refills | Status: DC

## 2016-12-01 MED ORDER — FUROSEMIDE 40 MG PO TABS: 40.0000 mg | ORAL_TABLET | Freq: Every day | ORAL | 3 refills | Status: DC

## 2016-12-01 NOTE — Telephone Encounter (Signed)
Pt  Complaining of  Sob  And  Chest pain for  2 weeks   Dr  Burt Knack  Will call pt ./cy

## 2016-12-01 NOTE — Telephone Encounter (Signed)
New Message     Pt c/o Shortness Of Breath: STAT if SOB developed within the last 24 hours or pt is noticeably SOB on the phone  1. Are you currently SOB (can you hear that pt is SOB on the phone)? no  2. How long have you been experiencing SOB?  A week    3. Are you SOB when sitting or when up moving around? Comes and goes , it happen we laying down , sitting and moving   4. Are you currently experiencing any other symptoms? Chest pressure , or if the stands up too quickly he gets dizzy

## 2016-12-01 NOTE — Telephone Encounter (Signed)
I spoke to the patient. He is having some orthopnea and exertional dyspnea. No leg swelling or chest pain. Reviewed his most recent echo demonstrating normal function of his aortic valve bioprosthesis is in grade 2 diastolic dysfunction. Recommend that we try him on furosemide 40 mg daily and K-Dur 10 milliequivalents daily with a metabolic panel and BNP in 2 weeks. He understands to seek immediate attention if his symptoms worsen. He uses [facility].

## 2016-12-01 NOTE — Telephone Encounter (Signed)
Prescriptions sent to [facility] and pt aware of lab appointment 12/15/16.

## 2016-12-15 ENCOUNTER — Other Ambulatory Visit: Payer: BLUE CROSS/BLUE SHIELD | Admitting: *Deleted

## 2016-12-15 DIAGNOSIS — R0609 Other forms of dyspnea: Secondary | ICD-10-CM

## 2016-12-15 DIAGNOSIS — R0601 Orthopnea: Secondary | ICD-10-CM

## 2016-12-15 LAB — BASIC METABOLIC PANEL
BUN/Creatinine Ratio: 14 (ref 10–24)
BUN: 16 mg/dL (ref 8–27)
CO2: 23 mmol/L (ref 18–29)
Calcium: 9.1 mg/dL (ref 8.6–10.2)
Chloride: 99 mmol/L (ref 96–106)
Creatinine, Ser: 1.15 mg/dL (ref 0.76–1.27)
GFR calc Af Amer: 78 mL/min/{1.73_m2} (ref >59)
GFR calc non Af Amer: 67 mL/min/{1.73_m2} (ref >59)
Glucose: 191 mg/dL — ABNORMAL HIGH (ref 65–99)
Potassium: 4.3 mmol/L (ref 3.5–5.2)
Sodium: 139 mmol/L (ref 134–144)

## 2016-12-15 LAB — PRO B NATRIURETIC PEPTIDE: NT-Pro BNP: 80 pg/mL (ref 0–210)

## 2017-04-16 ENCOUNTER — Other Ambulatory Visit: Payer: Self-pay | Admitting: Cardiovascular Disease

## 2017-04-16 DIAGNOSIS — R0601 Orthopnea: Secondary | ICD-10-CM

## 2017-04-16 DIAGNOSIS — R0609 Other forms of dyspnea: Secondary | ICD-10-CM

## 2017-05-25 ENCOUNTER — Ambulatory Visit: Payer: BLUE CROSS/BLUE SHIELD | Admitting: Family

## 2017-05-25 ENCOUNTER — Encounter (HOSPITAL_COMMUNITY): Payer: BLUE CROSS/BLUE SHIELD

## 2017-06-07 ENCOUNTER — Other Ambulatory Visit: Payer: Self-pay | Admitting: Cardiovascular Disease

## 2017-06-29 ENCOUNTER — Ambulatory Visit: Payer: BLUE CROSS/BLUE SHIELD | Admitting: Family

## 2017-06-29 ENCOUNTER — Encounter (HOSPITAL_COMMUNITY): Payer: BLUE CROSS/BLUE SHIELD

## 2017-07-18 ENCOUNTER — Ambulatory Visit: Payer: BLUE CROSS/BLUE SHIELD | Admitting: Cardiovascular Disease

## 2017-07-18 ENCOUNTER — Encounter (INDEPENDENT_AMBULATORY_CARE_PROVIDER_SITE_OTHER): Payer: Self-pay

## 2017-07-18 ENCOUNTER — Encounter: Payer: Self-pay | Admitting: Cardiovascular Disease

## 2017-07-18 VITALS — BP 112/66 | HR 52 | Ht 72.0 in | Wt 227.0 lb

## 2017-07-18 DIAGNOSIS — R0609 Other forms of dyspnea: Secondary | ICD-10-CM

## 2017-07-18 DIAGNOSIS — I359 Nonrheumatic aortic valve disorder, unspecified: Secondary | ICD-10-CM

## 2017-07-18 DIAGNOSIS — R0601 Orthopnea: Secondary | ICD-10-CM | POA: Diagnosis not present

## 2017-07-18 DIAGNOSIS — I2510 Atherosclerotic heart disease of native coronary artery without angina pectoris: Secondary | ICD-10-CM | POA: Diagnosis not present

## 2017-07-18 DIAGNOSIS — I10 Essential (primary) hypertension: Secondary | ICD-10-CM

## 2017-07-18 DIAGNOSIS — I5032 Chronic diastolic (congestive) heart failure: Secondary | ICD-10-CM | POA: Diagnosis not present

## 2017-07-18 MED ORDER — ASPIRIN 81 MG PO TABS: 81.0000 mg | ORAL_TABLET | Freq: Every day | ORAL | Status: DC

## 2017-07-18 MED ORDER — FUROSEMIDE 20 MG PO TABS: 20.0000 mg | ORAL_TABLET | Freq: Every day | ORAL | 3 refills | Status: DC

## 2017-07-18 NOTE — Progress Notes (Signed)
Cardiology Office Note Date:  07/20/2017   ID:  [name], DOB January 30, 1951, MRN [id number]  PCP:  Vickii Penna, MD  Cardiologist:  Sherren Mocha, MD    Chief Complaint  Patient presents with  . Follow-up    CHF     History of Present Illness: [name] is a 66 y.o. male who presents for The patient has a history of severe bicuspid aortic valve stenosis status post pericardial tissue valve replacement in 2012. At the time he underwent CABG with a LIMA to left circumflex graft. The patient also has peripheral arterial disease with history of femoral-pop bypass and endovascular treatments followed by vascular surgery.   He is here alone today, doing well from a cardiac perspective. 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 Today, he denies symptoms of palpitations, chest pain, shortness of breath, orthopnea, PND, lower extremity edema, dizziness, or syncope.    Past Medical History:  Diagnosis Date  . Aortic stenosis    a. severe bicuspid AV stenosis s/p pericardial tissue valve 2012.  . Arthritis   . Coronary artery disease    a. s/p CABG (LIMA-LCx) at time of AVR 2012, mild nonobstructive LAD and RCA stenoses.  . Depression   . Femoral-popliteal bypass graft occlusion, right [facility]) 1997   Dr. Kellie Simmering  . GERD (gastroesophageal reflux disease)   . Headache(784.0)   . Hyperlipidemia   . Hypertension   . 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  [name] PONV (postoperative nausea and vomiting)   . PVD (peripheral vascular disease) ([place])     Past Surgical History:  Procedure Laterality Date  . AORTIC VALVE REPLACEMENT  07/18/2011   Procedure: AORTIC VALVE REPLACEMENT (AVR);  Surgeon: Grace Isaac, MD;  Location: [place];  Service: Open Heart Surgery;  Laterality: N/A;  . CARDIAC CATHETERIZATION  2009, 07/11/11   Dr. Irish Lack  . CORONARY ARTERY BYPASS GRAFT  07/18/2011   Procedure:  CORONARY ARTERY BYPASS GRAFTING (CABG);  Surgeon: Grace Isaac, MD;  Location: [place];  Service: Open Heart Surgery;  Laterality: N/A;  times one to mammary artery, left  . FEMORAL ARTERY - POPLITEAL ARTERY BYPASS GRAFT    . KNEE ARTHROSCOPY     left  . MRI     to visualize aortic valve  . PERIPHERAL VASCULAR CATHETERIZATION N/A 07/12/2016   Procedure: Abdominal Aortogram w/Lower Extremity;  Surgeon: Waynetta Sandy, MD;  Location: [place] [facility];  Service: Cardiovascular;  Laterality: N/A;  . PERIPHERAL VASCULAR CATHETERIZATION Right 07/12/2016   Procedure: Peripheral Vascular Atherectomy;  Surgeon: Waynetta Sandy, MD;  Location: [place] [facility];  Service: Cardiovascular;  Laterality: Right;  Anterior Tibial and Popliteal  . ROTATOR CUFF REPAIR     Left  . US ECHOCARDIOGRAPHY      Current Outpatient Medications  Medication Sig Dispense Refill  . Adalimumab (HUMIRA PEN) 40 MG/0.8ML PNKT Inject 40 mg into the skin every 14 (fourteen) days.    [name] aspirin 81 MG tablet Take 1 tablet (81 mg total) by mouth daily.    [name] atorvastatin (LIPITOR) 80 MG tablet TAKE ONE TABLET AT BEDTIME. 30 tablet 6  . citalopram (CELEXA) 40 MG tablet Take 40 mg by mouth daily at 12 noon.     . fluticasone (FLONASE) 50 MCG/ACT nasal spray Place 1-2 sprays into both nostrils daily as needed for allergies or rhinitis.    [name]  furosemide (LASIX) 20 MG tablet Take 1 tablet (20 mg total) by mouth daily. 90 tablet 3  . ibuprofen (ADVIL,MOTRIN) 200 MG tablet Take 600 mg by mouth every 6 (six) hours as needed for headache or moderate pain.    [name] lansoprazole (PREVACID) 15 MG capsule Take 15 mg by mouth daily as needed. For acid reflux    . metoprolol (LOPRESSOR) 50 MG tablet Take 0.5 tablets (25 mg total) by mouth 2 (two) times daily. 90 tablet 2  . sodium chloride (OCEAN) 0.65 % SOLN nasal spray Place 1 spray into both nostrils as needed for congestion.    . tamsulosin (FLOMAX) 0.4 MG CAPS capsule  Take 0.4 mg by mouth daily.      No current facility-administered medications for this visit.     Allergies:   Clopidogrel; Codeine; and Morphine and related   Social History:  The patient  reports that  has never smoked. he has never used smokeless tobacco. He reports that he does not drink alcohol or use drugs.   Family History:  The patient's  family history includes Cancer in his father and sister; Healthy in his brother and maternal grandfather; Heart disease in his maternal grandmother.    ROS:  Please see the history of present illness.  Otherwise, review of systems is positive for headache.  All other systems are reviewed and negative.    PHYSICAL EXAM: VS:  BP 112/66   Pulse (!) 52   Ht 6' (1.829 m)   Wt 227 lb (103 kg)   BMI 30.79 kg/m  , BMI Body mass index is 30.79 kg/m. GEN: Well nourished, well developed, in no acute distress  HEENT: normal  Neck: no JVD, no masses. No carotid bruits Cardiac: RRR with 2/6 SEM at the RUSB               Respiratory:  clear to auscultation bilaterally, normal work of breathing GI: soft, nontender, nondistended, + BS MS: no deformity or atrophy  Ext: no pretibial edema, pedal pulses 2+= bilaterally Skin: warm and dry, no rash Neuro:  Strength and sensation are intact Psych: euthymic mood, full affect  EKG:  EKG is ordered today. The ekg ordered today shows sinus brady 52 bpm, otherwise within normal limits  Recent Labs: 12/15/2016: BUN 16; Creatinine, Ser 1.15; NT-Pro BNP 80; Potassium 4.3; Sodium 139   Lipid Panel     Component Value Date/Time   CHOL 141 05/12/2016 0854   TRIG 138 05/12/2016 0854   HDL 31 (L) 05/12/2016 0854   CHOLHDL 4.5 05/12/2016 0854   VLDL 28 05/12/2016 0854   LDLCALC 82 05/12/2016 0854      Wt Readings from Last 3 Encounters:  07/18/17 227 lb (103 kg)  11/17/16 233 lb (105.7 kg)  08/18/16 233 lb 12.8 oz (106.1 kg)     Cardiac Studies Reviewed: Echo 04-28-2016: Study Conclusions  - Left  ventricle: The cavity size was normal. Wall thickness was   increased in a pattern of mild LVH. Systolic function was normal.   The estimated ejection fraction was in the range of 55% to 60%.   Wall motion was normal; there were no regional wall motion   abnormalities. Features are consistent with a pseudonormal left   ventricular filling pattern, with concomitant abnormal relaxation   and increased filling pressure (grade 2 diastolic dysfunction). - Aortic valve: A bioprosthesis was present. There was trivial   regurgitation. - Aortic root: The aortic root was mildly dilated. - Left atrium:  The atrium was mildly dilated.  Impressions:  - Normal LV systolic function; grade 2 diastolic dysfunction; mild   LVH; s/p AVR with normal mean gradient (10 mmHg); trace   paravalvular AI; mild LAE; mild TR.  ASSESSMENT AND PLAN: 1.  Aortic valve disease: doing well, exam stable, last echo reviewed. Follows SBE per guideline recommendations.   2. Chronic diastolic HF, NYHA 1: euvolemic on exam. Reduce lasix to 20 mg daily. Continue clinical FU.  3. HTN: BP controlled on current Rx. Last labs reviewed.   4. PAD with intermittent claudication: has done well since undergoing endovascular Rx by Dr Donzetta Matters.  Followed by [facility].   5. CAD, native vessel, without angina: advised decrease ASA to 81 mg, otherwise stable on current Rx.   Current medicines are reviewed with the patient today.  The patient does not have concerns regarding medicines.  Labs/ tests ordered today include:  Orders Placed This Encounter  Procedures  . EKG 12-Lead    Disposition:   FU 6 months  Signed, Sherren Mocha, MD  07/20/2017 11:15 PM    [facility] [address], [place], [place]  [address] Phone: [phone]; Fax: [phone]

## 2017-07-18 NOTE — Patient Instructions (Addendum)
Medication Instructions:  1) DECREASE LASIX to 20 mg daily 2) STOP POTASSIUM 3) DECREASE ASPIRIN to 81 mg daily  Labwork: None  Testing/Procedures: None  Follow-Up: Your provider wants you to follow-up in: 6 months with Dr. Burt Knack. You will receive a reminder letter in the mail two months in advance. If you don't receive a letter, please call our office to schedule the follow-up appointment.    Any Other Special Instructions Will Be Listed Below (If Applicable).     If you need a refill on your cardiac medications before your next appointment, please call your pharmacy.

## 2017-07-20 ENCOUNTER — Encounter: Payer: Self-pay | Admitting: Cardiovascular Disease

## 2017-08-13 ENCOUNTER — Other Ambulatory Visit: Payer: Self-pay | Admitting: Physician Assistant

## 2017-08-17 ENCOUNTER — Ambulatory Visit (INDEPENDENT_AMBULATORY_CARE_PROVIDER_SITE_OTHER): Payer: BLUE CROSS/BLUE SHIELD | Admitting: Family

## 2017-08-17 ENCOUNTER — Encounter: Payer: Self-pay | Admitting: Family

## 2017-08-17 ENCOUNTER — Ambulatory Visit (HOSPITAL_COMMUNITY)
Admission: RE | Admit: 2017-08-17 | Discharge: 2017-08-17 | Disposition: A | Discharge status: Home / Self Care | Payer: BLUE CROSS/BLUE SHIELD | Source: Ambulatory Visit | Attending: Family | Admitting: Family

## 2017-08-17 ENCOUNTER — Other Ambulatory Visit: Payer: Self-pay

## 2017-08-17 VITALS — BP 111/70 | HR 78 | Temp 98.7°F | Resp 18 | Ht 72.0 in | Wt 228.0 lb

## 2017-08-17 DIAGNOSIS — Z9862 Peripheral vascular angioplasty status: Secondary | ICD-10-CM | POA: Diagnosis not present

## 2017-08-17 DIAGNOSIS — I779 Disorder of arteries and arterioles, unspecified: Secondary | ICD-10-CM | POA: Diagnosis not present

## 2017-08-17 NOTE — Patient Instructions (Signed)

## 2017-08-17 NOTE — Progress Notes (Signed)
[facility] [place]   CC: Follow up peripheral artery occlusive disease  History of Present Illness [name] is a 67 y.o. male who is s/p rotational atherectomy right AT, rotational atherectomy right popliteal artery, and drug coated balloon angioplasty of right popliteal artery with 52mm impact by Dr. Donzetta Matters on 07-12-16 by for right lower extremity life-limiting claudication. Findings: The aortoiliac segments bilaterally are patent and free of disease. He has bilateral above-knee popliteal artery occlusions which reconstitute at the knee. On the right side he has a diseased runoff of the anterior tibial artery with occluded tibial peroneal trunk and runoff via collaterals to the PT and peroneal arteries. On the left side he reconstitutes his below-knee popliteal and has dominant runoff via the posterior tibial artery. Following intervention there is direct in-line flow via the anterior tibial on the right to the level of the foot.  Hepreviously underwent right superficial femoral artery to below-knee popliteal bypass with great saphenous vein by Dr. Kellie Simmering in 1997. His bypass graft occluded in 2008. Previous to this he was last seen in the office in 2009 where he had stable calf claudication. He was encouraged to continue staying active.   He officiatesfootball games part-time. He denies any nonhealing wounds or rest pain. He denies any issues with his left leg.  He has never been a smoker. He is not diabetic. He has had hypertension that is well controlled. He is on a statin for hyperlipidemia. He previously had aortic stenosis status post AVR which is stable by recent echo. He has CAD s/p CABG (LIMA-LCx).He is not on any blood thinners. His mother had peripheral vascular disease.  He had an episode of near syncope while officiating football in 70 heat in September 2017. A head CT scan was negative. It was felt that his symptoms were secondary to dehydration and  heat.   He previously had a carotid duplex in 2014 with less than 40% bilateral internal carotid artery stenosis.  After about walking 1/4 mile his right calf cramps, then resolves with more walking.  He is walking about 30-40 minutes, almost daily; denies any further right foot burning sensation. He is also riding his bicycle.  His left knee pain limits his walking now. He had a meniscus tear repaired in the 1990's, left knee. Cortisone injection in left knee, October 2018, helped a great deal.  He denies dizziness, denies dyspnea, denies feeling any more tired than usual, denies chest pain.   He was evaluated by Dr. Burt Knack in January 2018 for asymptomatic cardiac arrhythmia, sinus arrhythmia was demonstrated on EKG; he apparently has a history of asymptomatic sinus arrhythmia.   He denies any known history of stroke or TIA.   Pt Diabetic: No Pt smoker: stopped smokeless tobacco in his late 20's, never smoked  Pt meds include: Statin :Yes Betablocker: Yes ASA: Yes, 325 mg daily Other anticoagulants/antiplatelets: no, Plavix caused itching   Past Medical History:  Diagnosis Date  . Aortic stenosis    a. severe bicuspid AV stenosis s/p pericardial tissue valve 2012.  . Arthritis   . Coronary artery disease    a. s/p CABG (LIMA-LCx) at time of AVR 2012, mild nonobstructive LAD and RCA stenoses.  . Depression   . Femoral-popliteal bypass graft occlusion, right [facility]) 1997   Dr. Kellie Simmering  . GERD (gastroesophageal reflux disease)   . Headache(784.0)   . Hyperlipidemia   . Hypertension   . Near syncope    a. 03/2016 while officiating football  in 90 degree heat.  [name] PONV (postoperative nausea and vomiting)   . PVD (peripheral vascular disease) ([place])     Social History Social History   Tobacco Use  . Smoking status: Never Smoker  . Smokeless tobacco: Never Used  Substance Use Topics  . Alcohol use: No  . Drug use: No    Family History Family History  Problem  Relation Age of Onset  . Cancer Father   . Cancer Sister   . Healthy Brother   . Heart disease Maternal Grandmother   . Healthy Maternal Grandfather     Past Surgical History:  Procedure Laterality Date  . AORTIC VALVE REPLACEMENT  07/18/2011   Procedure: AORTIC VALVE REPLACEMENT (AVR);  Surgeon: Grace Isaac, MD;  Location: [place];  Service: Open Heart Surgery;  Laterality: N/A;  . CARDIAC CATHETERIZATION  2009, 07/11/11   Dr. Irish Lack  . CORONARY ARTERY BYPASS GRAFT  07/18/2011   Procedure: CORONARY ARTERY BYPASS GRAFTING (CABG);  Surgeon: Grace Isaac, MD;  Location: [place];  Service: Open Heart Surgery;  Laterality: N/A;  times one to mammary artery, left  . FEMORAL ARTERY - POPLITEAL ARTERY BYPASS GRAFT    . KNEE ARTHROSCOPY     left  . MRI     to visualize aortic valve  . PERIPHERAL VASCULAR CATHETERIZATION N/A 07/12/2016   Procedure: Abdominal Aortogram w/Lower Extremity;  Surgeon: Waynetta Sandy, MD;  Location: [place] [facility];  Service: Cardiovascular;  Laterality: N/A;  . PERIPHERAL VASCULAR CATHETERIZATION Right 07/12/2016   Procedure: Peripheral Vascular Atherectomy;  Surgeon: Waynetta Sandy, MD;  Location: [place] [facility];  Service: Cardiovascular;  Laterality: Right;  Anterior Tibial and Popliteal  . ROTATOR CUFF REPAIR     Left  . US ECHOCARDIOGRAPHY      Allergies  Allergen Reactions  . Clopidogrel Itching  . Codeine Other (See Comments)    Blood pressure drops  . Morphine And Related Itching    Current Outpatient Medications  Medication Sig Dispense Refill  . Adalimumab (HUMIRA PEN) 40 MG/0.8ML PNKT Inject 40 mg into the skin every 14 (fourteen) days.    [name] aspirin 81 MG tablet Take 1 tablet (81 mg total) by mouth daily.    [name] atorvastatin (LIPITOR) 80 MG tablet TAKE ONE TABLET AT BEDTIME. 90 tablet 3  . citalopram (CELEXA) 40 MG tablet Take 40 mg by mouth daily at 12 noon.     . fluticasone (FLONASE) 50 MCG/ACT nasal spray  Place 1-2 sprays into both nostrils daily as needed for allergies or rhinitis.    . furosemide (LASIX) 20 MG tablet Take 1 tablet (20 mg total) by mouth daily. 90 tablet 3  . ibuprofen (ADVIL,MOTRIN) 200 MG tablet Take 600 mg by mouth every 6 (six) hours as needed for headache or moderate pain.    [name] lansoprazole (PREVACID) 15 MG capsule Take 15 mg by mouth daily as needed. For acid reflux    . metoprolol (LOPRESSOR) 50 MG tablet Take 0.5 tablets (25 mg total) by mouth 2 (two) times daily. 90 tablet 2  . sodium chloride (OCEAN) 0.65 % SOLN nasal spray Place 1 spray into both nostrils as needed for congestion.    . tamsulosin (FLOMAX) 0.4 MG CAPS capsule Take 0.4 mg by mouth daily.      No current facility-administered medications for this visit.     ROS: See HPI for pertinent positives and negatives.   Physical Examination  Vitals:   08/17/17 1454  BP: 111/70  Pulse: 78  Resp: 18  Temp: 98.7 F (37.1 C)  TempSrc: Oral  SpO2: 95%  Weight: 228 lb (103.4 kg)  Height: 6' (1.829 m)   Body mass index is 30.92 kg/m.  General: The patient appears his stated age, obese male. HEENT: No gross abnormalities Pulmonary: Respirations are non-labored, CTAB, good air movement in all fields. Abdomen: Soft and non-tender with normal pitched bowel sounds.  Musculoskeletal:There are no major deformities.  Neurologic:No focal weakness or paresthesias are detected. CN 2-12 intact.  Skin:There are no ulcer or rashes noted. Psychiatric:The patient has normal affect. Cardiovascular:There is an regular rhythm and rate,without significant murmur appreciated.   Vascular: Vessel Right Left  Radial Palpable Palpable  Carotid Palpable, without bruit Palpable, without bruit  Aorta Not palpable N/A  Femoral 2+Palpable 2+Palpable  Popliteal Not palpable Not palpable  PT notPalpable 1+Palpable  DP notPalpable notPalpable     ASSESSMENT: SHAWNEE HIGHAM is a 67 y.o. male who is s/p  rotational atherectomy right AT, rotational atherectomy right popliteal artery, and drug coated balloon angioplasty of right popliteal artery with 95mm impact by Dr. Donzetta Matters on 07-12-16 by for right lower extremity life-limiting claudication.  His right LE claudication symptoms resolved for a few months since the above procedure, then returned. He has no signs of ischemia in his feet/legs.   Significant decline in right LE ABI, left remains normal. Right calf claudicates after walking 1/4 mile, and improves with more walking; no claudication sx's in left leg. Bilateral femoral pulses are 2+.  Will schedule right LE arterial duplex in 2 weeks, see me afterward.   His atherosclerotic risk factors include CAD, family history of PAD, and obesity. Fortunately he does not have DM and never used tobacco.   DATA  ABI (Date: 08/17/2017):  R:   ABI: 0.73 (was 0.89 on 11-17-16),   PT: mono (was biphasic)  DP: mono (was biphasic)  TBI:  0.50 (was 0.56)  L:   ABI: 1.03 (was 0.97),   PT: bi (was triphasic)  DP: bi (was triphasic)  TBI: 0.48 (was 0.64)   Significant decline in right ABI form mild to moderate disease, all monophasic waveforms, significant decline in right TBI.  Stable left ABI with no disease, biphasic waveforms, stable left TBI.    PLAN:  Graduated walking program discussed and how to achieve, but since his walking is limited by his left knee pain, have instructed in daily seated leg exercises. Senior water aerobics also advised if available to him.   Based on ABI results, HPI, and physical exam, he is advised to return in 2 weeks with right LE arterial duplex, see me afterward.  I discussed in depth with the patient the nature of atherosclerosis, and emphasized the importance of maximal medical management including strict control of blood pressure, blood glucose, and lipid levels, obtaining regular exercise, and continued cessation of smoking.  The patient is aware that  without maximal medical management the underlying atherosclerotic disease process will progress, limiting the benefit of any interventions.  The patient was given information about PAD including signs, symptoms, treatment, what symptoms should prompt the patient to seek immediate medical care, and risk reduction measures to take.  Clemon Chambers, RN, MSN, FNP-C [facility] Phone: [phone]  Clinic MD: Beverly Sessions  08/17/17 3:18 PM

## 2017-08-27 NOTE — Addendum Note (Signed)
Addended by: Lianne Cure A on: 08/27/2017 02:22 PM   Modules accepted: Orders

## 2017-08-30 ENCOUNTER — Ambulatory Visit (INDEPENDENT_AMBULATORY_CARE_PROVIDER_SITE_OTHER): Payer: BLUE CROSS/BLUE SHIELD | Admitting: Family

## 2017-08-30 ENCOUNTER — Other Ambulatory Visit: Payer: Self-pay

## 2017-08-30 ENCOUNTER — Encounter: Payer: Self-pay | Admitting: Family

## 2017-08-30 ENCOUNTER — Ambulatory Visit (HOSPITAL_COMMUNITY)
Admission: RE | Admit: 2017-08-30 | Discharge: 2017-08-30 | Disposition: A | Discharge status: Home / Self Care | Payer: BLUE CROSS/BLUE SHIELD | Source: Ambulatory Visit | Attending: Family | Admitting: Family

## 2017-08-30 VITALS — BP 102/59 | HR 57 | Temp 98.0°F | Resp 16 | Ht 72.0 in | Wt 228.0 lb

## 2017-08-30 DIAGNOSIS — I70411 Atherosclerosis of autologous vein bypass graft(s) of the extremities with intermittent claudication, right leg: Secondary | ICD-10-CM | POA: Diagnosis not present

## 2017-08-30 DIAGNOSIS — I779 Disorder of arteries and arterioles, unspecified: Secondary | ICD-10-CM

## 2017-08-30 DIAGNOSIS — I70201 Unspecified atherosclerosis of native arteries of extremities, right leg: Secondary | ICD-10-CM | POA: Insufficient documentation

## 2017-08-30 DIAGNOSIS — Z9862 Peripheral vascular angioplasty status: Secondary | ICD-10-CM | POA: Diagnosis not present

## 2017-08-30 LAB — VAS US LOWER EXTREMITY BYPASS GRAFT DUPLEX: Right super femoral prox sys PSV: 63 cm/s

## 2017-08-30 NOTE — Progress Notes (Signed)
[facility] [place]   CC: Follow up peripheral artery occlusive disease  History of Present Illness [name] is a 67 y.o. male who is s/protational atherectomy right AT,rotational atherectomy right popliteal artery, anddrug coated balloon angioplasty of right popliteal artery with 39mm impact by Dr. Donzetta Matters on 07-12-16 by for right lower extremity life-limiting claudication. Findings: The aortoiliac segments bilaterally are patent and free of disease. He has bilateral above-knee popliteal artery occlusions which reconstitute at the knee. On the right side he has a diseased runoff of the anterior tibial artery with occluded tibial peroneal trunk and runoff via collaterals to the PT and peroneal arteries. On the left side he reconstitutes his below-knee popliteal and has dominant runoff via the posterior tibial artery. Following intervention there is direct in-line flow via the anterior tibial on the right to the level of the foot.  Hepreviously underwent right superficial femoral artery to below-knee popliteal bypass with great saphenous vein by Dr. Kellie Simmering in 1997. His bypass graft occluded in 2008. Previous to this he was last seen in the office in 2009 where he had stable calf claudication. He was encouraged to continue staying active.   He officiatesfootball games part-time. He denies any nonhealing wounds or rest pain. He denies any issues with his left leg.  He has never been a smoker. He is not diabetic. He has had hypertension that is well controlled. He is on a statin for hyperlipidemia. He previously had aortic stenosis status post AVR which is stable by recent echo. He has CAD s/p CABG (LIMA-LCx).He is not on any blood thinners. His mother had peripheral vascular disease.  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 A head CT scan was negative. It was felt that his symptoms were secondary to dehydration and  heat.   He previously had a carotid duplex in 2014 with less than 40% bilateral internal carotid artery stenosis.  After about walking 1/4 mile his right calf cramps, then resolves with more walking. He is walking about 30-83minutes, almost daily; denies any further right foot burning sensation.He is also riding his bicycle. His left knee pain limits his walking. He had a meniscus tear repaired in the 1990's, left knee. Cortisone injection in left knee, October 2018, helped a great deal.  He denies dizziness, denies dyspnea, denies feeling any more tired than usual, denies chest pain.  He was evaluated by Dr. Burt Knack in January 2018 for asymptomatic cardiac arrhythmia, sinus arrhythmiawas demonstrated on EKG; he apparently has a history of asymptomatic sinus arrhythmia.  He denies any known history of stroke or TIA.  Pt Diabetic:No Pt smoker:stopped smokeless tobacco in his late 20's, never smoked  Pt meds include: Statin :Yes Betablocker:Yes ASA:Yes, 325 mg daily Other anticoagulants/antiplatelets:no, Plavix caused itching     Past Medical History:  Diagnosis Date  . Aortic stenosis    a. severe bicuspid AV stenosis s/p pericardial tissue valve 2012.  . Arthritis   . Coronary artery disease    a. s/p CABG (LIMA-LCx) at time of AVR 2012, mild nonobstructive LAD and RCA stenoses.  . Depression   . Femoral-popliteal bypass graft occlusion, right [facility]) 1997   Dr. Kellie Simmering  . GERD (gastroesophageal reflux disease)   . Headache(784.0)   . Hyperlipidemia   . Hypertension   . 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  [name] PONV (postoperative nausea and vomiting)   . PVD (  peripheral vascular disease) ([place])     Social History Social History   Tobacco Use  . Smoking status: Never Smoker  . Smokeless tobacco: Never Used  Substance Use Topics  . Alcohol use: No  . Drug use: No    Family History Family History  Problem Relation  Age of Onset  . Cancer Father   . Cancer Sister   . Healthy Brother   . Heart disease Maternal Grandmother   . Healthy Maternal Grandfather     Past Surgical History:  Procedure Laterality Date  . AORTIC VALVE REPLACEMENT  07/18/2011   Procedure: AORTIC VALVE REPLACEMENT (AVR);  Surgeon: Grace Isaac, MD;  Location: [facility];  Service: Open Heart Surgery;  Laterality: N/A;  . CARDIAC CATHETERIZATION  2009, 07/11/11   Dr. Irish Lack  . CORONARY ARTERY BYPASS GRAFT  07/18/2011   Procedure: CORONARY ARTERY BYPASS GRAFTING (CABG);  Surgeon: Grace Isaac, MD;  Location: [place];  Service: Open Heart Surgery;  Laterality: N/A;  times one to mammary artery, left  . FEMORAL ARTERY - POPLITEAL ARTERY BYPASS GRAFT    . KNEE ARTHROSCOPY     left  . MRI     to visualize aortic valve  . PERIPHERAL VASCULAR CATHETERIZATION N/A 07/12/2016   Procedure: Abdominal Aortogram w/Lower Extremity;  Surgeon: Waynetta Sandy, MD;  Location: [place];  Service: Cardiovascular;  Laterality: N/A;  . PERIPHERAL VASCULAR CATHETERIZATION Right 07/12/2016   Procedure: Peripheral Vascular Atherectomy;  Surgeon: Waynetta Sandy, MD;  Location: [place] [facility];  Service: Cardiovascular;  Laterality: Right;  Anterior Tibial and Popliteal  . ROTATOR CUFF REPAIR     Left  . US ECHOCARDIOGRAPHY      Allergies  Allergen Reactions  . Clopidogrel Itching  . Codeine Other (See Comments)    Blood pressure drops  . Morphine And Related Itching    Current Outpatient Medications  Medication Sig Dispense Refill  . Adalimumab (HUMIRA PEN) 40 MG/0.8ML PNKT Inject 40 mg into the skin every 14 (fourteen) days.    [name] aspirin 81 MG tablet Take 1 tablet (81 mg total) by mouth daily.    [name] atorvastatin (LIPITOR) 80 MG tablet TAKE ONE TABLET AT BEDTIME. 90 tablet 3  . citalopram (CELEXA) 40 MG tablet Take 40 mg by mouth daily at 12 noon.     . fluticasone (FLONASE) 50 MCG/ACT nasal spray Place 1-2  sprays into both nostrils daily as needed for allergies or rhinitis.    . furosemide (LASIX) 20 MG tablet Take 1 tablet (20 mg total) by mouth daily. 90 tablet 3  . ibuprofen (ADVIL,MOTRIN) 200 MG tablet Take 600 mg by mouth every 6 (six) hours as needed for headache or moderate pain.    [name] lansoprazole (PREVACID) 15 MG capsule Take 15 mg by mouth daily as needed. For acid reflux    . metoprolol (LOPRESSOR) 50 MG tablet Take 0.5 tablets (25 mg total) by mouth 2 (two) times daily. 90 tablet 2  . pantoprazole (PROTONIX) 40 MG tablet     . sodium chloride (OCEAN) 0.65 % SOLN nasal spray Place 1 spray into both nostrils as needed for congestion.    . tamsulosin (FLOMAX) 0.4 MG CAPS capsule Take 0.4 mg by mouth daily.      No current facility-administered medications for this visit.     ROS: See HPI for pertinent positives and negatives.   Physical Examination  Vitals:   08/30/17 1226 08/30/17 1232  BP: (!) 93/55 [name])  102/59  Pulse: (!) 55 (!) 57  Resp: 16   Temp: 98 F (36.7 C)   TempSrc: Oral   SpO2: 98%   Weight: 228 lb (103.4 kg)   Height: 6' (1.829 m)    Body mass index is 30.92 kg/m.  General: The patient appears his stated age, obese male. HEENT: No gross abnormalities Pulmonary: Respirations are non-labored, CTAB, good air movement in all fields. Abdomen: Soft and non-tender with normal pitched bowel sounds.  Musculoskeletal:There are no major deformities.  Neurologic:No focal weakness or paresthesias are detected. CN 2-12 intact.  Skin:There are no ulcer or rashes noted. Psychiatric: Thought content is normal, mood appropriate for clinical situation.   Cardiovascular:There is a regular rhythm and rate,without significant murmur appreciated.   Vascular: Vessel Right Left  Radial Palpable Palpable  Carotid Palpable, without bruit Palpable, without bruit  Aorta Not palpable N/A  Femoral 2+Palpable 2+Palpable  Popliteal Not palpable Not palpable  PT  notPalpable 1+Palpable  DP notPalpable notPalpable     ASSESSMENT: [name] is a 67 y.o. male who is s/protational atherectomy right AT,rotational atherectomy right popliteal artery, anddrug coated balloon angioplasty of right popliteal artery with 59mm impact by Dr. Donzetta Matters on 07-12-16 by for right lower extremity life-limiting claudication.  There was a significant decline in right LE ABI at his visit on 08-17-17 compared to the previous ABI on 11-17-16 , left remained normal.    His right LE claudication symptoms resolved for a few months since the above procedure, then returned about 6 months after the surgery, to his recollection.  Right calf starts to claudicate (mild) after walking about 1/4 mile. He states that this does not slow him down, "I forget about it", and continues walking.  He has no signs of ischemia in his feet/legs.  No claudication sx's in left leg. Bilateral femoral pulses are 2+ palpable.  I discussed with Dr. Oneida Alar pt HPI, physical exam results, and result of right LE arterial duplex.  Offered pt conservative approach to continue graduated walking program and return in 6 months with ABI's if his claudication sx's are not life limiting for him, or schedule an arteriogram to address the right SFA occlusion and right popliteal artery stenosis of 75-99%.  He chose the conservative approach, and will notify us if his right calf symptoms become life limiting.   His atherosclerotic risk factors include CAD, family history of PAD, and obesity. Fortunately he does not have DM and never used tobacco.   DATA  Right LE Arterial Duplex (08/30/17): Stenosis in the right SFA and and popliteal artery.  No blood flow noted in the right  distal SFA and proximal to mid right popliteal artery. Stenotic distal right popliteal artery.   ABI (Date: 08/17/2017):  R:  ? ABI: 0.73 (was 0.89 on 11-17-16),  ? PT: mono (was biphasic) ? DP: mono (was biphasic) ? TBI:  0.50  (was 0.56)  L:  ? ABI: 1.03 (was 0.97),  ? PT: bi (was triphasic) ? DP: bi (was triphasic) ? TBI: 0.48 (was 0.64)  ? Significant decline in right ABI form mild to moderate disease, all monophasic waveforms, significant decline in right TBI. ? Stable left ABI with no disease, biphasic waveforms, stable left TBI.    PLAN:  Based on the patient's vascular studies and examination, pt will return to clinic in 6 months with ABI's.   Graduated walking program discussed and how to achieve, but since his walking is limited by his left knee pain, have  instructed in daily seated leg exercises. Senior water aerobics also advised if available to him.   I discussed in depth with the patient the nature of atherosclerosis, and emphasized the importance of maximal medical management including strict control of blood pressure, blood glucose, and lipid levels, obtaining regular exercise, and continued cessation of smoking.  The patient is aware that without maximal medical management the underlying atherosclerotic disease process will progress, limiting the benefit of any interventions.  The patient was given information about PAD including signs, symptoms, treatment, what symptoms should prompt the patient to seek immediate medical care, and risk reduction measures to take.  Clemon Chambers, RN, MSN, FNP-C [facility] Phone: [phone]  Clinic MD: [facility]  08/30/17 12:57 PM

## 2017-08-30 NOTE — Patient Instructions (Signed)

## 2017-08-30 NOTE — Progress Notes (Signed)
Vitals:   08/30/17 1226  BP: (!) 93/55  Pulse: (!) 55  Resp: 16  Temp: 98 F (36.7 C)  TempSrc: Oral  SpO2: 98%  Weight: 228 lb (103.4 kg)  Height: 6' (1.829 m)

## 2017-09-03 ENCOUNTER — Other Ambulatory Visit: Payer: Self-pay

## 2017-09-03 DIAGNOSIS — I739 Peripheral vascular disease, unspecified: Secondary | ICD-10-CM

## 2017-09-24 DIAGNOSIS — L821 Other seborrheic keratosis: Secondary | ICD-10-CM | POA: Diagnosis not present

## 2017-09-24 DIAGNOSIS — L814 Other melanin hyperpigmentation: Secondary | ICD-10-CM | POA: Diagnosis not present

## 2017-09-24 DIAGNOSIS — D225 Melanocytic nevi of trunk: Secondary | ICD-10-CM | POA: Diagnosis not present

## 2017-09-24 DIAGNOSIS — Z85828 Personal history of other malignant neoplasm of skin: Secondary | ICD-10-CM | POA: Diagnosis not present

## 2017-09-24 DIAGNOSIS — L918 Other hypertrophic disorders of the skin: Secondary | ICD-10-CM | POA: Diagnosis not present

## 2017-09-27 DIAGNOSIS — H6121 Impacted cerumen, right ear: Secondary | ICD-10-CM | POA: Diagnosis not present

## 2017-10-06 ENCOUNTER — Other Ambulatory Visit: Payer: Self-pay | Admitting: Cardiovascular Disease

## 2017-10-08 NOTE — Telephone Encounter (Signed)
Pt's pharmacy is requesting a refill on amoxicillin. Please address

## 2017-10-12 DIAGNOSIS — H6983 Other specified disorders of Eustachian tube, bilateral: Secondary | ICD-10-CM | POA: Diagnosis not present

## 2017-10-12 DIAGNOSIS — R110 Nausea: Secondary | ICD-10-CM | POA: Diagnosis not present

## 2017-10-12 DIAGNOSIS — H6122 Impacted cerumen, left ear: Secondary | ICD-10-CM | POA: Diagnosis not present

## 2017-10-16 DIAGNOSIS — R635 Abnormal weight gain: Secondary | ICD-10-CM | POA: Diagnosis not present

## 2017-10-16 DIAGNOSIS — E291 Testicular hypofunction: Secondary | ICD-10-CM | POA: Diagnosis not present

## 2017-10-18 DIAGNOSIS — Z1339 Encounter for screening examination for other mental health and behavioral disorders: Secondary | ICD-10-CM | POA: Diagnosis not present

## 2017-10-18 DIAGNOSIS — Z72820 Sleep deprivation: Secondary | ICD-10-CM | POA: Diagnosis not present

## 2017-10-18 DIAGNOSIS — E785 Hyperlipidemia, unspecified: Secondary | ICD-10-CM | POA: Diagnosis not present

## 2017-10-18 DIAGNOSIS — R6882 Decreased libido: Secondary | ICD-10-CM | POA: Diagnosis not present

## 2017-10-18 DIAGNOSIS — Z1331 Encounter for screening for depression: Secondary | ICD-10-CM | POA: Diagnosis not present

## 2017-10-18 DIAGNOSIS — R7303 Prediabetes: Secondary | ICD-10-CM | POA: Diagnosis not present

## 2017-10-26 DIAGNOSIS — E785 Hyperlipidemia, unspecified: Secondary | ICD-10-CM | POA: Diagnosis not present

## 2017-10-29 DIAGNOSIS — M25562 Pain in left knee: Secondary | ICD-10-CM | POA: Diagnosis not present

## 2017-10-29 DIAGNOSIS — G8929 Other chronic pain: Secondary | ICD-10-CM | POA: Diagnosis not present

## 2017-10-29 DIAGNOSIS — M1712 Unilateral primary osteoarthritis, left knee: Secondary | ICD-10-CM | POA: Diagnosis not present

## 2017-11-02 DIAGNOSIS — I10 Essential (primary) hypertension: Secondary | ICD-10-CM | POA: Diagnosis not present

## 2017-11-02 DIAGNOSIS — E785 Hyperlipidemia, unspecified: Secondary | ICD-10-CM | POA: Diagnosis not present

## 2017-11-09 DIAGNOSIS — I10 Essential (primary) hypertension: Secondary | ICD-10-CM | POA: Diagnosis not present

## 2017-11-09 DIAGNOSIS — E785 Hyperlipidemia, unspecified: Secondary | ICD-10-CM | POA: Diagnosis not present

## 2017-11-16 DIAGNOSIS — E785 Hyperlipidemia, unspecified: Secondary | ICD-10-CM | POA: Diagnosis not present

## 2017-11-16 DIAGNOSIS — R7303 Prediabetes: Secondary | ICD-10-CM | POA: Diagnosis not present

## 2017-11-16 DIAGNOSIS — I10 Essential (primary) hypertension: Secondary | ICD-10-CM | POA: Diagnosis not present

## 2017-11-21 DIAGNOSIS — M1712 Unilateral primary osteoarthritis, left knee: Secondary | ICD-10-CM | POA: Diagnosis not present

## 2017-11-21 DIAGNOSIS — Z79899 Other long term (current) drug therapy: Secondary | ICD-10-CM | POA: Diagnosis not present

## 2017-11-21 DIAGNOSIS — L4050 Arthropathic psoriasis, unspecified: Secondary | ICD-10-CM | POA: Diagnosis not present

## 2017-11-21 DIAGNOSIS — L409 Psoriasis, unspecified: Secondary | ICD-10-CM | POA: Diagnosis not present

## 2017-11-30 DIAGNOSIS — E785 Hyperlipidemia, unspecified: Secondary | ICD-10-CM | POA: Diagnosis not present

## 2017-12-03 DIAGNOSIS — K115 Sialolithiasis: Secondary | ICD-10-CM | POA: Diagnosis not present

## 2017-12-27 DIAGNOSIS — N401 Enlarged prostate with lower urinary tract symptoms: Secondary | ICD-10-CM | POA: Diagnosis not present

## 2018-01-02 DIAGNOSIS — N401 Enlarged prostate with lower urinary tract symptoms: Secondary | ICD-10-CM | POA: Diagnosis not present

## 2018-01-02 DIAGNOSIS — N5201 Erectile dysfunction due to arterial insufficiency: Secondary | ICD-10-CM | POA: Diagnosis not present

## 2018-01-02 DIAGNOSIS — R3912 Poor urinary stream: Secondary | ICD-10-CM | POA: Diagnosis not present

## 2018-01-03 ENCOUNTER — Other Ambulatory Visit: Payer: Self-pay | Admitting: Cardiovascular Disease

## 2018-02-13 DIAGNOSIS — M1A00X0 Idiopathic chronic gout, unspecified site, without tophus (tophi): Secondary | ICD-10-CM | POA: Insufficient documentation

## 2018-02-20 DIAGNOSIS — L821 Other seborrheic keratosis: Secondary | ICD-10-CM | POA: Diagnosis not present

## 2018-02-20 DIAGNOSIS — Z85828 Personal history of other malignant neoplasm of skin: Secondary | ICD-10-CM | POA: Diagnosis not present

## 2018-02-20 DIAGNOSIS — L570 Actinic keratosis: Secondary | ICD-10-CM | POA: Diagnosis not present

## 2018-02-20 DIAGNOSIS — C44619 Basal cell carcinoma of skin of left upper limb, including shoulder: Secondary | ICD-10-CM | POA: Diagnosis not present

## 2018-02-20 DIAGNOSIS — L814 Other melanin hyperpigmentation: Secondary | ICD-10-CM | POA: Diagnosis not present

## 2018-02-20 DIAGNOSIS — C44612 Basal cell carcinoma of skin of right upper limb, including shoulder: Secondary | ICD-10-CM | POA: Diagnosis not present

## 2018-02-26 DIAGNOSIS — G8929 Other chronic pain: Secondary | ICD-10-CM | POA: Diagnosis not present

## 2018-02-26 DIAGNOSIS — M25562 Pain in left knee: Secondary | ICD-10-CM | POA: Diagnosis not present

## 2018-02-26 DIAGNOSIS — M1712 Unilateral primary osteoarthritis, left knee: Secondary | ICD-10-CM | POA: Diagnosis not present

## 2018-02-26 DIAGNOSIS — M65842 Other synovitis and tenosynovitis, left hand: Secondary | ICD-10-CM | POA: Diagnosis not present

## 2018-03-01 ENCOUNTER — Other Ambulatory Visit: Payer: Self-pay

## 2018-03-01 ENCOUNTER — Ambulatory Visit (HOSPITAL_COMMUNITY)
Admission: RE | Admit: 2018-03-01 | Discharge: 2018-03-01 | Disposition: A | Discharge status: Home / Self Care | Payer: BLUE CROSS/BLUE SHIELD | Source: Ambulatory Visit | Attending: Family | Admitting: Family

## 2018-03-01 ENCOUNTER — Telehealth: Payer: Self-pay | Admitting: Cardiovascular Disease

## 2018-03-01 ENCOUNTER — Encounter: Payer: Self-pay | Admitting: Family

## 2018-03-01 ENCOUNTER — Ambulatory Visit: Payer: BLUE CROSS/BLUE SHIELD | Admitting: Family

## 2018-03-01 VITALS — BP 123/81 | HR 78 | Resp 20 | Ht 72.0 in | Wt 224.0 lb

## 2018-03-01 DIAGNOSIS — Z9862 Peripheral vascular angioplasty status: Secondary | ICD-10-CM

## 2018-03-01 DIAGNOSIS — E785 Hyperlipidemia, unspecified: Secondary | ICD-10-CM | POA: Diagnosis not present

## 2018-03-01 DIAGNOSIS — I779 Disorder of arteries and arterioles, unspecified: Secondary | ICD-10-CM

## 2018-03-01 DIAGNOSIS — I10 Essential (primary) hypertension: Secondary | ICD-10-CM | POA: Diagnosis not present

## 2018-03-01 DIAGNOSIS — I2510 Atherosclerotic heart disease of native coronary artery without angina pectoris: Secondary | ICD-10-CM | POA: Insufficient documentation

## 2018-03-01 DIAGNOSIS — I739 Peripheral vascular disease, unspecified: Secondary | ICD-10-CM | POA: Diagnosis not present

## 2018-03-01 MED ORDER — CILOSTAZOL 100 MG PO TABS: 100.0000 mg | ORAL_TABLET | Freq: Two times a day (BID) | ORAL | 11 refills | Status: DC

## 2018-03-01 NOTE — Patient Instructions (Signed)

## 2018-03-01 NOTE — Telephone Encounter (Signed)
New Message   Pt calling, states that the vein and vascilar gave him a medication that dilated the veins in his legs. He does not know the name of it but says its in his chart. Pt wants to check with Dr. Burt Knack to make sure its ok. Please call

## 2018-03-01 NOTE — Telephone Encounter (Signed)
This is the medication patient was started on. Will send to Dr. Burt Knack for advisement.  Cilostazol 100 mg Oral 2 times daily before meals

## 2018-03-01 NOTE — Telephone Encounter (Signed)
Patient aware of Dr. Antionette Char recommendations.

## 2018-03-01 NOTE — Progress Notes (Signed)
[facility] [place]   CC: Follow up peripheral artery occlusive disease  History of Present Illness [name] is a 67 y.o. male who is s/protational atherectomy right AT,rotational atherectomy right popliteal artery, anddrug coated balloon angioplasty of right popliteal artery with 71mm impact by Dr. Donzetta Matters on 07-12-16 by for right lower extremity life-limiting claudication.  Hepreviously underwent right superficial femoral artery to below-knee popliteal bypass with great saphenous vein by Dr. Kellie Simmering in 1997. His bypass graft occluded in 2008. Previous to this he was last seen in the office in 2009 where he had stable calf claudication. He was encouraged to continue staying active.   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 He denies any nonhealing wounds or rest pain. He denies any issues with his left leg.  He has never been a smoker. He is not diabetic. He has had hypertension that is well controlled. He is on a statin for hyperlipidemia. He previously had aortic stenosis status post AVR which is stable by recent echo. He has CAD s/p CABG (LIMA-LCx).He is not on any blood thinners. His mother had peripheral vascular disease.  He had an episode of near syncope while officiating football in 63 heat in September 2017. A head CT scan was negative. It was felt that his symptoms were secondary to dehydration and heat.   He previously had a carotid duplex in 2014 with less than 40% bilateral internal carotid artery stenosis.  After about walking 1/4 mile his right calf cramps, thenresolves with more walking. He is walking about 30-35minutes,almost daily; denies any further right foot burning sensation.He is also riding his bicycle. His left knee pain is not limiting his walking since he receives cortisone injections. He had a meniscus tear repaired in the 1990's, left knee.  He denies dizziness, denies dyspnea, denies feeling any more tired  than usual, denies chest pain.  He was evaluated by Dr. Burt Knack in January 2018 for asymptomatic cardiac arrhythmia, sinus arrhythmiawas demonstrated on EKG; he apparently has a history of asymptomatic sinus arrhythmia. He denies ever having an MI, denies any CHF.   He denies any known history of stroke or TIA.  Pt Diabetic:No Pt smoker:stopped smokeless tobacco in his late 20's, never smoked  Pt meds include: Statin :Yes Betablocker:Yes ASA:Yes, 325 mg daily Other anticoagulants/antiplatelets:no, Plavix caused itching     Past Medical History:  Diagnosis Date  . Aortic stenosis    a. severe bicuspid AV stenosis s/p pericardial tissue valve 2012.  . Arthritis   . Coronary artery disease    a. s/p CABG (LIMA-LCx) at time of AVR 2012, mild nonobstructive LAD and RCA stenoses.  . Depression   . Femoral-popliteal bypass graft occlusion, right [facility]) 1997   Dr. Kellie Simmering  . GERD (gastroesophageal reflux disease)   . Headache(784.0)   . Hyperlipidemia   . Hypertension   . 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  [name] PONV (postoperative nausea and vomiting)   . PVD (peripheral vascular disease) ([place])     Social History Social History   Tobacco Use  . Smoking status: Never Smoker  . Smokeless tobacco: Never Used  Substance Use Topics  . Alcohol use: No  . Drug use: No    Family History Family History  Problem Relation Age of Onset  . Cancer Father   . Cancer Sister   . Healthy Brother   . Heart disease Maternal Grandmother   . Healthy Maternal Grandfather  Past Surgical History:  Procedure Laterality Date  . AORTIC VALVE REPLACEMENT  07/18/2011   Procedure: AORTIC VALVE REPLACEMENT (AVR);  Surgeon: Grace Isaac, MD;  Location: [place];  Service: Open Heart Surgery;  Laterality: N/A;  . CARDIAC CATHETERIZATION  2009, 07/11/11   Dr. Irish Lack  . CORONARY ARTERY BYPASS GRAFT  07/18/2011   Procedure: CORONARY ARTERY  BYPASS GRAFTING (CABG);  Surgeon: Grace Isaac, MD;  Location: [place];  Service: Open Heart Surgery;  Laterality: N/A;  times one to mammary artery, left  . FEMORAL ARTERY - POPLITEAL ARTERY BYPASS GRAFT    . KNEE ARTHROSCOPY     left  . MRI     to visualize aortic valve  . PERIPHERAL VASCULAR CATHETERIZATION N/A 07/12/2016   Procedure: Abdominal Aortogram w/Lower Extremity;  Surgeon: Waynetta Sandy, MD;  Location: [place] [facility];  Service: Cardiovascular;  Laterality: N/A;  . PERIPHERAL VASCULAR CATHETERIZATION Right 07/12/2016   Procedure: Peripheral Vascular Atherectomy;  Surgeon: Waynetta Sandy, MD;  Location: [place] [facility];  Service: Cardiovascular;  Laterality: Right;  Anterior Tibial and Popliteal  . ROTATOR CUFF REPAIR     Left  . US ECHOCARDIOGRAPHY      Allergies  Allergen Reactions  . Clopidogrel Itching  . Codeine Other (See Comments)    Blood pressure drops  . Morphine And Related Itching    Current Outpatient Medications  Medication Sig Dispense Refill  . Adalimumab (HUMIRA PEN) 40 MG/0.8ML PNKT Inject 40 mg into the skin every 14 (fourteen) days.    [name] aspirin 81 MG tablet Take 1 tablet (81 mg total) by mouth daily.    [name] atorvastatin (LIPITOR) 80 MG tablet TAKE ONE TABLET AT BEDTIME. 90 tablet 3  . citalopram (CELEXA) 40 MG tablet Take 40 mg by mouth daily at 12 noon.     . fluticasone (FLONASE) 50 MCG/ACT nasal spray Place 1-2 sprays into both nostrils daily as needed for allergies or rhinitis.    . furosemide (LASIX) 20 MG tablet Take 1 tablet (20 mg total) by mouth daily. (Patient taking differently: Take 10 mg by mouth daily. ) 90 tablet 3  . ibuprofen (ADVIL,MOTRIN) 200 MG tablet Take 600 mg by mouth every 6 (six) hours as needed for headache or moderate pain.    [name] lansoprazole (PREVACID) 15 MG capsule Take 15 mg by mouth daily as needed. For acid reflux    . metoprolol tartrate (LOPRESSOR) 50 MG tablet TAKE (1/2) TABLET TWICE  DAILY. 45 tablet 2  . pantoprazole (PROTONIX) 40 MG tablet     . sodium chloride (OCEAN) 0.65 % SOLN nasal spray Place 1 spray into both nostrils as needed for congestion.    . tamsulosin (FLOMAX) 0.4 MG CAPS capsule Take 0.4 mg by mouth daily.     [name] amoxicillin (AMOXIL) 500 MG capsule TAKE 4 CAPSULES ONE HOUR BEFORE DENTAL APPOINTMENT. (Patient not taking: Reported on 03/01/2018) 8 capsule 0   No current facility-administered medications for this visit.     ROS: See HPI for pertinent positives and negatives.   Physical Examination  Vitals:   03/01/18 1503  BP: 123/81  Pulse: 78  Resp: 20  SpO2: 97%  Weight: 224 lb (101.6 kg)  Height: 6' (1.829 m)   Body mass index is 30.38 kg/m.  General: A&O x 3, WDWN, obese male. Gait: normal HENT: No gross abnormalities.  Eyes: PERRLA. Pulmonary: Respirations are non labored, CTAB, good air movement in all fields Cardiac: Irregular rhythm, no  detected murmur.         Carotid Bruits Right Left   Negative Negative   Radial pulses are 2+ palpable bilaterally   Adominal aortic pulse is not palpable                         VASCULAR EXAM: Extremities without ischemic changes, without Gangrene; without open wounds.                                                                                                          LE Pulses Right Left       FEMORAL  1+ palpable  2+ palpable        POPLITEAL  not palpable   not palpable       POSTERIOR TIBIAL  not palpable   not palpable        DORSALIS PEDIS      ANTERIOR TIBIAL not palpable  not palpable    Abdomen: soft, NT, no palpable masses. Skin: no rashes, no cellulitis, no ulcers noted. Musculoskeletal: no muscle wasting or atrophy.  Neurologic: A&O X 3; appropriate affect, Sensation is normal; MOTOR FUNCTION:  moving all extremities equally, motor strength 5/5 throughout. Speech is fluent/normal. CN 2-12 intact. Psychiatric: Thought content is normal, mood appropriate for clinical  situation.     ASSESSMENT: [name] is a 67 y.o. male who is s/protational atherectomy right AT,rotational atherectomy right popliteal artery, anddrug coated balloon angioplasty of right popliteal artery with 50mm impact by Dr. Donzetta Matters on 07-12-16 by for right lower extremity life-limiting claudication.  There was a significant decline in right LE ABI at his visit on 08-17-17 compared to the previous ABI on 11-17-16 , left remained normal.    His right LE claudication symptoms resolvedfor a few months since the above procedure, then returned about 6 months after the surgery, to his recollection.  Right calf starts to claudicate (mild) after walking about 1/4 mile. He states that this does not slow him down, "I forget about it", and continues walking.  He has no signs of ischemia in his feet/legs. No claudication sx's in left leg. Bilateral femoral pulses are palpable.   At his visit on 08-30-17, I discussed with Dr. Oneida Alar pt HPI, physical exam results, and result of right LE arterial duplex.  Offered pt conservative approach to continue graduated walking program and return in 6 months with ABI's if his claudication sx's are not life limiting for him, or schedule an arteriogram to address the right SFA occlusion and right popliteal artery stenosis of 75-99%.  He chose the conservative approach, and will notify us if his right calf symptoms become life limiting.   His atherosclerotic risk factors include CAD, family history of PAD, and obesity. Fortunately he does not have DM and never used tobacco.   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 Decline in bilateral ABI, but slight improvement in bilateral TBI. Will add Pletal to decrease his  mild right calf claudication, and facilitate more walking.  He continues to want to take a conservative approach.    DATA  ABI (Date: 03/01/2018):  R:   ABI: 0.66  (was 0.73 on 08-17-17),   PT: mono  DP: mono  TBI:  0.53, toe pressure 69 (was 0.50)  L:   ABI: 0.89 (was 1.03),   PT: tri  DP: mono  TBI: 0.67, toe pressure 88 (was 0.48)  Slight decline in bilateral ABI.  Moderate disease in the right with monophasic waveforms, mild disease in the left with tri and monophasic waveforms.  Slight improvement in bilateral TBI.    Right LE Arterial Duplex (08/30/17): Stenosis in the right SFA and and popliteal artery.  No blood flow noted in the right  distal SFA and proximal to mid right popliteal artery. Stenotic distal right popliteal artery.   Aortogram (07-12-16): Findings: The aortoiliac segments bilaterally are patent and free of disease. He has bilateral above-knee popliteal artery occlusions which reconstitute at the knee. On the right side he has a diseased runoff of the anterior tibial artery with occluded tibial peroneal trunk and runoff via collaterals to the PT and peroneal arteries. On the left side he reconstitutes his below-knee popliteal and has dominant runoff via the posterior tibial artery. Following intervention there is direct in-line flow via the anterior tibial on the right to the level of the foot.    PLAN:  To facilitate increasing his walking distance: Start Pletal 100 mg po once every morning x 1-2 weeks; if no or minimal side effects, and some benefit, increase to one tab po bid.  Based on the patient's vascular studies and examination, pt will return to clinic in 6 months with ABI's.  I advised him to notify us if he develops concerns re worsening circulation in his feet or legs.   Graduated walking program discussed and how to achieve Senior water aerobics also advised if available to him.     I discussed in depth with the patient the nature of atherosclerosis, and emphasized the importance of maximal medical management including strict control of blood pressure, blood glucose, and lipid levels, obtaining  regular exercise, and continued cessation of smoking.  The patient is aware that without maximal medical management the underlying atherosclerotic disease process will progress, limiting the benefit of any interventions.  The patient was given information about PAD including signs, symptoms, treatment, what symptoms should prompt the patient to seek immediate medical care, and risk reduction measures to take.  Clemon Chambers, RN, MSN, FNP-C [facility] Phone: [phone]  Clinic MD: Donzetta Matters  03/01/18 3:14 PM

## 2018-03-01 NOTE — Telephone Encounter (Signed)
This is ok! thanks

## 2018-03-05 DIAGNOSIS — C44612 Basal cell carcinoma of skin of right upper limb, including shoulder: Secondary | ICD-10-CM | POA: Diagnosis not present

## 2018-03-18 ENCOUNTER — Ambulatory Visit: Payer: BLUE CROSS/BLUE SHIELD | Admitting: Cardiovascular Disease

## 2018-03-18 ENCOUNTER — Encounter

## 2018-03-18 ENCOUNTER — Encounter: Payer: Self-pay | Admitting: Cardiovascular Disease

## 2018-03-18 ENCOUNTER — Other Ambulatory Visit: Payer: Self-pay

## 2018-03-18 VITALS — BP 126/86 | HR 77 | Ht 72.0 in | Wt 228.6 lb

## 2018-03-18 DIAGNOSIS — E785 Hyperlipidemia, unspecified: Secondary | ICD-10-CM

## 2018-03-18 DIAGNOSIS — I2510 Atherosclerotic heart disease of native coronary artery without angina pectoris: Secondary | ICD-10-CM | POA: Diagnosis not present

## 2018-03-18 DIAGNOSIS — I359 Nonrheumatic aortic valve disorder, unspecified: Secondary | ICD-10-CM

## 2018-03-18 DIAGNOSIS — I779 Disorder of arteries and arterioles, unspecified: Secondary | ICD-10-CM

## 2018-03-18 NOTE — Patient Instructions (Addendum)
Medication Instructions:  Your provider recommends that you continue on your current medications as directed. Please refer to the Current Medication list given to you today.    Labwork: You will have fasting labs drawn when you come for your echocardiogram.   Testing/Procedures: Your provider has requested that you have an echocardiogram. Echocardiography is a painless test that uses sound waves to create images of your heart. It provides your doctor with information about the size and shape of your heart and how well your heart's chambers and valves are working. This procedure takes approximately one hour. There are no restrictions for this procedure. You are scheduled for your echo this Thursday, 8/22. Please arrive to our office at 8:00AM.      Follow-Up: Your provider wants you to follow-up in: 1 year with Dr. Burt Knack. You will receive a reminder letter in the mail two months in advance. If you don't receive a letter, please call our office to schedule the follow-up appointment.    Any Other Special Instructions Will Be Listed Below (If Applicable).     If you need a refill on your cardiac medications before your next appointment, please call your pharmacy.

## 2018-03-18 NOTE — Progress Notes (Signed)
Cardiology Office Note Date:  03/18/2018   ID:  [name], DOB [birth year], MRN [id number]  PCP:  Vickii Penna, MD  Cardiologist:  Sherren Mocha, MD    Chief Complaint  Patient presents with  . Shortness of Breath     History of Present Illness: [name] is a 67 y.o. male who presents for follow-up evaluation.   The patient has a history of severe bicuspid aortic valve stenosis status post pericardial tissue valve replacement in 2012. At the time he underwent CABG with a LIMA to left circumflex graft. The patient also has peripheral arterial disease with history of femoral-pop bypass and endovascular treatments followed by vascular surgery.   He is here alone today. Overall doing well. He has mild DOE which is unchanged over the past few years. No CP, orthopnea, PND, or dizziness. 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   Past Medical History:  Diagnosis Date  . Aortic stenosis    a. severe bicuspid AV stenosis s/p pericardial tissue valve 2012.  . Arthritis   . Coronary artery disease    a. s/p CABG (LIMA-LCx) at time of AVR 2012, mild nonobstructive LAD and RCA stenoses.  . Depression   . Femoral-popliteal bypass graft occlusion, right [facility]) 1997   Dr. Kellie Simmering  . GERD (gastroesophageal reflux disease)   . Headache(784.0)   . Hyperlipidemia   . Hypertension   . 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  [name] PONV (postoperative nausea and vomiting)   . PVD (peripheral vascular disease) ([place])     Past Surgical History:  Procedure Laterality Date  . AORTIC VALVE REPLACEMENT  07/18/2011   Procedure: AORTIC VALVE REPLACEMENT (AVR);  Surgeon: Grace Isaac, MD;  Location: [place];  Service: Open Heart Surgery;  Laterality: N/A;  . CARDIAC CATHETERIZATION  2009, 07/11/11   Dr. Irish Lack  . CORONARY ARTERY BYPASS GRAFT  07/18/2011   Procedure: CORONARY ARTERY BYPASS GRAFTING (CABG);  Surgeon:  Grace Isaac, MD;  Location: [place];  Service: Open Heart Surgery;  Laterality: N/A;  times one to mammary artery, left  . FEMORAL ARTERY - POPLITEAL ARTERY BYPASS GRAFT    . KNEE ARTHROSCOPY     left  . MRI     to visualize aortic valve  . PERIPHERAL VASCULAR CATHETERIZATION N/A 07/12/2016   Procedure: Abdominal Aortogram w/Lower Extremity;  Surgeon: Waynetta Sandy, MD;  Location: [place] [facility];  Service: Cardiovascular;  Laterality: N/A;  . PERIPHERAL VASCULAR CATHETERIZATION Right 07/12/2016   Procedure: Peripheral Vascular Atherectomy;  Surgeon: Waynetta Sandy, MD;  Location: [place] [facility];  Service: Cardiovascular;  Laterality: Right;  Anterior Tibial and Popliteal  . ROTATOR CUFF REPAIR     Left  . US ECHOCARDIOGRAPHY      Current Outpatient Medications  Medication Sig Dispense Refill  . Adalimumab (HUMIRA PEN) 40 MG/0.8ML PNKT Inject 40 mg into the skin every 14 (fourteen) days.    [name] amoxicillin (AMOXIL) 500 MG capsule TAKE 4 CAPSULES ONE HOUR BEFORE DENTAL APPOINTMENT. 8 capsule 0  . aspirin 81 MG tablet Take 1 tablet (81 mg total) by mouth daily.    [name] atorvastatin (LIPITOR) 80 MG tablet TAKE ONE TABLET AT BEDTIME. 90 tablet 3  . cilostazol (PLETAL) 100 MG tablet Take 1 tablet (100 mg total) by mouth 2 (two) times daily before a meal. 60 tablet 11  . citalopram (  CELEXA) 40 MG tablet Take 40 mg by mouth daily at 12 noon.     . fluticasone (FLONASE) 50 MCG/ACT nasal spray Place 1-2 sprays into both nostrils daily as needed for allergies or rhinitis.    . furosemide (LASIX) 20 MG tablet Take 20 mg by mouth daily. Take 10 mg    . ibuprofen (ADVIL,MOTRIN) 200 MG tablet Take 600 mg by mouth every 6 (six) hours as needed for headache or moderate pain.    [name] lansoprazole (PREVACID) 15 MG capsule Take 15 mg by mouth daily as needed. For acid reflux    . metoprolol tartrate (LOPRESSOR) 50 MG tablet TAKE (1/2) TABLET TWICE DAILY. 45 tablet 2  . pantoprazole  (PROTONIX) 40 MG tablet     . sodium chloride (OCEAN) 0.65 % SOLN nasal spray Place 1 spray into both nostrils as needed for congestion.    . tamsulosin (FLOMAX) 0.4 MG CAPS capsule Take 0.4 mg by mouth daily.      No current facility-administered medications for this visit.     Allergies:   Clopidogrel; Codeine; and Morphine and related   Social History:  The patient  reports that he has never smoked. He has never used smokeless tobacco. He reports that he does not drink alcohol or use drugs.   Family History:  The patient's family history includes Cancer in his father and sister; Healthy in his brother and maternal grandfather; Heart disease in his maternal grandmother.    ROS:  Please see the history of present illness.  Otherwise, review of systems is positive for leg pain.  All other systems are reviewed and negative.    PHYSICAL EXAM: VS:  BP 126/86   Pulse 77   Ht 6' (1.829 m)   Wt 228 lb 9.6 oz (103.7 kg)   SpO2 93%   BMI 31.00 kg/m  , BMI Body mass index is 31 kg/m. GEN: Well nourished, well developed, in no acute distress  HEENT: normal  Neck: no JVD, no masses. No carotid bruits Cardiac: RRR with 2/6 SEM at the RUSB            Respiratory:  clear to auscultation bilaterally, normal work of breathing GI: soft, nontender, nondistended, + BS MS: no deformity or atrophy  Ext: no pretibial edema Skin: warm and dry, no rash Neuro:  Strength and sensation are intact Psych: euthymic mood, full affect  EKG:  EKG is not ordered today.  Recent Labs: No results found for requested labs within last 8760 hours.   Lipid Panel     Component Value Date/Time   CHOL 141 05/12/2016 0854   TRIG 138 05/12/2016 0854   HDL 31 (L) 05/12/2016 0854   CHOLHDL 4.5 05/12/2016 0854   VLDL 28 05/12/2016 0854   LDLCALC 82 05/12/2016 0854      Wt Readings from Last 3 Encounters:  03/18/18 228 lb 9.6 oz (103.7 kg)  03/01/18 224 lb (101.6 kg)  08/30/17 228 lb (103.4 kg)      Cardiac Studies Reviewed: 2D Echo 04-28-2016: Study Conclusions  - Left ventricle: The cavity size was normal. Wall thickness was   increased in a pattern of mild LVH. Systolic function was normal.   The estimated ejection fraction was in the range of 55% to 60%.   Wall motion was normal; there were no regional wall motion   abnormalities. Features are consistent with a pseudonormal left   ventricular filling pattern, with concomitant abnormal relaxation   and increased filling pressure (grade  2 diastolic dysfunction). - Aortic valve: A bioprosthesis was present. There was trivial   regurgitation. - Aortic root: The aortic root was mildly dilated. - Left atrium: The atrium was mildly dilated.  Impressions:  - Normal LV systolic function; grade 2 diastolic dysfunction; mild   LVH; s/p AVR with normal mean gradient (10 mmHg); trace   paravalvular AI; mild LAE; mild TR.  ASSESSMENT AND PLAN: 1.  Aortic vlave disease s/p bioprosthetic AVR: pt stable with NYHA functional class II sx's. Will repeat echo next year prior to his office visit. SBE prophylaxis reviewed. Continue ASA daily.  2. CAD, native vessel, without angina: s/p CABG. No symptoms, tolerating ASA and a statin drug.  3. HTN: BP controlled on current Rx.   4. Hyperlipidemia, mixed: continue statin drug. Most recent labs reviewed.   5. PAD with intermittent claudication. Followed by vascular surgery, stable symptoms noted.   Current medicines are reviewed with the patient today.  The patient does not have concerns regarding medicines.  Labs/ tests ordered today include:  No orders of the defined types were placed in this encounter.   Disposition:   FU one year  Signed, Sherren Mocha, MD  03/18/2018 9:41 AM    [facility] [address], [place], [place]  [address] Phone: [phone]; Fax: [phone]

## 2018-03-21 ENCOUNTER — Other Ambulatory Visit: Payer: BLUE CROSS/BLUE SHIELD | Admitting: *Deleted

## 2018-03-21 ENCOUNTER — Ambulatory Visit (HOSPITAL_COMMUNITY): Payer: BLUE CROSS/BLUE SHIELD | Attending: Cardiovascular Disease

## 2018-03-21 ENCOUNTER — Other Ambulatory Visit: Payer: Self-pay

## 2018-03-21 DIAGNOSIS — I083 Combined rheumatic disorders of mitral, aortic and tricuspid valves: Secondary | ICD-10-CM | POA: Insufficient documentation

## 2018-03-21 DIAGNOSIS — Z952 Presence of prosthetic heart valve: Secondary | ICD-10-CM | POA: Diagnosis not present

## 2018-03-21 DIAGNOSIS — R55 Syncope and collapse: Secondary | ICD-10-CM | POA: Insufficient documentation

## 2018-03-21 DIAGNOSIS — I739 Peripheral vascular disease, unspecified: Secondary | ICD-10-CM | POA: Insufficient documentation

## 2018-03-21 DIAGNOSIS — E669 Obesity, unspecified: Secondary | ICD-10-CM | POA: Diagnosis not present

## 2018-03-21 DIAGNOSIS — I110 Hypertensive heart disease with heart failure: Secondary | ICD-10-CM | POA: Diagnosis not present

## 2018-03-21 DIAGNOSIS — E785 Hyperlipidemia, unspecified: Secondary | ICD-10-CM | POA: Diagnosis not present

## 2018-03-21 DIAGNOSIS — I359 Nonrheumatic aortic valve disorder, unspecified: Secondary | ICD-10-CM

## 2018-03-21 DIAGNOSIS — I5030 Unspecified diastolic (congestive) heart failure: Secondary | ICD-10-CM | POA: Diagnosis not present

## 2018-03-21 DIAGNOSIS — I2510 Atherosclerotic heart disease of native coronary artery without angina pectoris: Secondary | ICD-10-CM | POA: Diagnosis not present

## 2018-03-21 DIAGNOSIS — Z951 Presence of aortocoronary bypass graft: Secondary | ICD-10-CM | POA: Diagnosis not present

## 2018-03-21 LAB — LIPID PANEL
Chol/HDL Ratio: 2.9 ratio (ref 0.0–5.0)
Cholesterol, Total: 122 mg/dL (ref 100–199)
HDL: 42 mg/dL (ref >39)
LDL Calculated: 52 mg/dL (ref 0–99)
Triglycerides: 138 mg/dL (ref 0–149)
VLDL Cholesterol Cal: 28 mg/dL (ref 5–40)

## 2018-03-22 ENCOUNTER — Telehealth: Payer: Self-pay

## 2018-03-22 DIAGNOSIS — I359 Nonrheumatic aortic valve disorder, unspecified: Secondary | ICD-10-CM

## 2018-03-22 NOTE — Telephone Encounter (Signed)
-----   Message from Sherren Mocha, MD sent at 03/22/2018  6:22 AM EDT ----- Echo reviewed. Mild-moderate bioprosthetic AI noted. Otherwise findings unchanged with normal LVEF and grade II diastolic dysfunction. Continue same Rx and repeat echo in one year prior to FU. thanks

## 2018-03-22 NOTE — Telephone Encounter (Signed)
Informed patient of results and verbal understanding expressed.   Echo ordered to be scheduled in 1 year. Patient agrees with treatment plan.

## 2018-05-08 ENCOUNTER — Other Ambulatory Visit: Payer: Self-pay | Admitting: Cardiovascular Disease

## 2018-06-05 DIAGNOSIS — R197 Diarrhea, unspecified: Secondary | ICD-10-CM | POA: Diagnosis not present

## 2018-06-11 DIAGNOSIS — R1011 Right upper quadrant pain: Secondary | ICD-10-CM | POA: Diagnosis not present

## 2018-06-11 DIAGNOSIS — R1084 Generalized abdominal pain: Secondary | ICD-10-CM | POA: Diagnosis not present

## 2018-06-14 DIAGNOSIS — H6590 Unspecified nonsuppurative otitis media, unspecified ear: Secondary | ICD-10-CM | POA: Diagnosis not present

## 2018-08-01 DIAGNOSIS — I10 Essential (primary) hypertension: Secondary | ICD-10-CM | POA: Diagnosis not present

## 2018-08-01 DIAGNOSIS — E782 Mixed hyperlipidemia: Secondary | ICD-10-CM | POA: Diagnosis not present

## 2018-08-01 DIAGNOSIS — Z0000 Encounter for general adult medical examination without abnormal findings: Secondary | ICD-10-CM | POA: Diagnosis not present

## 2018-08-01 DIAGNOSIS — Z125 Encounter for screening for malignant neoplasm of prostate: Secondary | ICD-10-CM | POA: Diagnosis not present

## 2018-08-01 DIAGNOSIS — K625 Hemorrhage of anus and rectum: Secondary | ICD-10-CM | POA: Diagnosis not present

## 2018-08-01 DIAGNOSIS — Z23 Encounter for immunization: Secondary | ICD-10-CM | POA: Diagnosis not present

## 2018-08-01 DIAGNOSIS — I739 Peripheral vascular disease, unspecified: Secondary | ICD-10-CM | POA: Diagnosis not present

## 2018-08-01 DIAGNOSIS — R51 Headache: Secondary | ICD-10-CM | POA: Diagnosis not present

## 2018-08-02 ENCOUNTER — Other Ambulatory Visit: Payer: Self-pay | Admitting: Family Medicine

## 2018-08-02 DIAGNOSIS — G8929 Other chronic pain: Secondary | ICD-10-CM

## 2018-08-02 DIAGNOSIS — R51 Headache: Principal | ICD-10-CM

## 2018-08-09 ENCOUNTER — Other Ambulatory Visit: Payer: Self-pay | Admitting: Cardiovascular Disease

## 2018-08-09 DIAGNOSIS — K219 Gastro-esophageal reflux disease without esophagitis: Secondary | ICD-10-CM | POA: Diagnosis not present

## 2018-08-09 NOTE — Telephone Encounter (Signed)
This is fine.-thx 

## 2018-08-09 NOTE — Telephone Encounter (Signed)
Pt is requesting a refill on furosemide 20mg  once daily, in the chart we have take 20mg  daily and take 10mg . Please address. Thank you.

## 2018-08-12 MED ORDER — FUROSEMIDE 20 MG PO TABS: 20.0000 mg | ORAL_TABLET | Freq: Every day | ORAL | 11 refills | Status: DC

## 2018-08-12 NOTE — Addendum Note (Signed)
Addended by: Harland German A on: 08/12/2018 08:26 AM   Modules accepted: Orders

## 2018-08-14 ENCOUNTER — Ambulatory Visit
Admission: RE | Admit: 2018-08-14 | Discharge: 2018-08-14 | Disposition: A | Discharge status: Home / Self Care | Payer: BLUE CROSS/BLUE SHIELD | Source: Ambulatory Visit | Attending: Family Medicine | Admitting: Family Medicine

## 2018-08-14 DIAGNOSIS — G8929 Other chronic pain: Secondary | ICD-10-CM

## 2018-08-14 DIAGNOSIS — R51 Headache: Principal | ICD-10-CM

## 2018-08-14 DIAGNOSIS — J0110 Acute frontal sinusitis, unspecified: Secondary | ICD-10-CM | POA: Diagnosis not present

## 2018-08-26 DIAGNOSIS — R51 Headache: Secondary | ICD-10-CM | POA: Diagnosis not present

## 2018-08-26 DIAGNOSIS — Z1159 Encounter for screening for other viral diseases: Secondary | ICD-10-CM | POA: Diagnosis not present

## 2018-08-26 DIAGNOSIS — Z0184 Encounter for antibody response examination: Secondary | ICD-10-CM | POA: Diagnosis not present

## 2018-08-27 NOTE — Progress Notes (Deleted)
HISTORY AND PHYSICAL     CC:  follow up. Requesting Provider:  Vickii Penna, MD  HPI: This is a 68 y.o. male who is here today for follow up.  He is s/p rotational atherectomy right AT,rotational atherectomy right popliteal artery, anddrug coated balloon angioplasty of right popliteal artery with 4mm impact by Dr. Donzetta Matters on 07-12-16 by for right lower extremity life-limiting claudication.  He has hx of SFA to BK popliteal bypass with GSV by Dr. Kellie Simmering in 1997, which occluded in 2008.  He does have CAD and is s/p CABG.  He was evaluated by Dr. Burt Knack in January 2018 for asymptomatic cardiac arrhythmia, sinus arrhythmiawas demonstrated on EKG; he apparently has a history of asymptomatic sinus arrhythmia.  The pt returns today for ***  The pt is on a statin for cholesterol management.    The pt does not have diabetes. The pt *** on *** for hypertension.  The pt *** on an aspirin.  Tobacco hx:  Never smoked  Past Medical History:  Diagnosis Date  . Aortic stenosis    a. severe bicuspid AV stenosis s/p pericardial tissue valve 2012.  . Arthritis   . Coronary artery disease    a. s/p CABG (LIMA-LCx) at time of AVR 2012, mild nonobstructive LAD and RCA stenoses.  . Depression   . Femoral-popliteal bypass graft occlusion, right [facility]) 1997   Dr. Kellie Simmering  . GERD (gastroesophageal reflux disease)   . Headache(784.0)   . Hyperlipidemia   . Hypertension   . 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  [name] PONV (postoperative nausea and vomiting)   . PVD (peripheral vascular disease) ([place])     Past Surgical History:  Procedure Laterality Date  . AORTIC VALVE REPLACEMENT  07/18/2011   Procedure: AORTIC VALVE REPLACEMENT (AVR);  Surgeon: Grace Isaac, MD;  Location: [place];  Service: Open Heart Surgery;  Laterality: N/A;  . CARDIAC CATHETERIZATION  2009, 07/11/11   Dr. Irish Lack  . CORONARY ARTERY BYPASS GRAFT  07/18/2011   Procedure: CORONARY ARTERY  BYPASS GRAFTING (CABG);  Surgeon: Grace Isaac, MD;  Location: [place];  Service: Open Heart Surgery;  Laterality: N/A;  times one to mammary artery, left  . FEMORAL ARTERY - POPLITEAL ARTERY BYPASS GRAFT    . KNEE ARTHROSCOPY     left  . MRI     to visualize aortic valve  . PERIPHERAL VASCULAR CATHETERIZATION N/A 07/12/2016   Procedure: Abdominal Aortogram w/Lower Extremity;  Surgeon: Waynetta Sandy, MD;  Location: [place] [facility];  Service: Cardiovascular;  Laterality: N/A;  . PERIPHERAL VASCULAR CATHETERIZATION Right 07/12/2016   Procedure: Peripheral Vascular Atherectomy;  Surgeon: Waynetta Sandy, MD;  Location: [place] [facility];  Service: Cardiovascular;  Laterality: Right;  Anterior Tibial and Popliteal  . ROTATOR CUFF REPAIR     Left  . US ECHOCARDIOGRAPHY      Allergies  Allergen Reactions  . Clopidogrel Itching  . Codeine Other (See Comments)    Blood pressure drops  . Morphine And Related Itching    Current Outpatient Medications  Medication Sig Dispense Refill  . Adalimumab (HUMIRA PEN) 40 MG/0.8ML PNKT Inject 40 mg into the skin every 14 (fourteen) days.    [name] amoxicillin (AMOXIL) 500 MG capsule TAKE 4 CAPSULES ONE HOUR BEFORE DENTAL APPOINTMENT. 8 capsule 0  . aspirin 81 MG tablet Take 1 tablet (81 mg total) by mouth daily.    [name] atorvastatin (LIPITOR)  80 MG tablet TAKE ONE TABLET AT BEDTIME. 90 tablet 3  . cilostazol (PLETAL) 100 MG tablet Take 1 tablet (100 mg total) by mouth 2 (two) times daily before a meal. 60 tablet 11  . citalopram (CELEXA) 40 MG tablet Take 40 mg by mouth daily at 12 noon.     . fluticasone (FLONASE) 50 MCG/ACT nasal spray Place 1-2 sprays into both nostrils daily as needed for allergies or rhinitis.    . furosemide (LASIX) 20 MG tablet Take 1 tablet (20 mg total) by mouth daily. 30 tablet 11  . ibuprofen (ADVIL,MOTRIN) 200 MG tablet Take 600 mg by mouth every 6 (six) hours as needed for headache or moderate pain.    [name]  lansoprazole (PREVACID) 15 MG capsule Take 15 mg by mouth daily as needed. For acid reflux    . metoprolol tartrate (LOPRESSOR) 50 MG tablet TAKE (1/2) TABLET TWICE DAILY. 30 tablet 11  . pantoprazole (PROTONIX) 40 MG tablet     . sodium chloride (OCEAN) 0.65 % SOLN nasal spray Place 1 spray into both nostrils as needed for congestion.    . tamsulosin (FLOMAX) 0.4 MG CAPS capsule Take 0.4 mg by mouth daily.      No current facility-administered medications for this visit.     Family History  Problem Relation Age of Onset  . Cancer Father   . Cancer Sister   . Healthy Brother   . Heart disease Maternal Grandmother   . Healthy Maternal Grandfather     Social History   Socioeconomic History  . Marital status: Married    Spouse name: Not on file  . Number of children: Not on file  . Years of education: Not on file  . Highest education level: Not on file  Occupational History  . Occupation: Horticulturist, commercial: [employer]  . Financial resource strain: Not on file  . Food insecurity:    Worry: Not on file    Inability: Not on file  . Transportation needs:    Medical: Not on file    Non-medical: Not on file  Tobacco Use  . Smoking status: Never Smoker  . Smokeless tobacco: Never Used  Substance and Sexual Activity  . Alcohol use: No  . Drug use: No  . Sexual activity: Not on file  Lifestyle  . Physical activity:    Days per week: Not on file    Minutes per session: Not on file  . Stress: Not on file  Relationships  . Social connections:    Talks on phone: Not on file    Gets together: Not on file    Attends religious service: Not on file    Active member of club or organization: Not on file    Attends meetings of clubs or organizations: Not on file    Relationship status: Not on file  . Intimate partner violence:    Fear of current or ex partner: Not on file    Emotionally abused: Not on file    Physically abused: Not on file    Forced sexual  activity: Not on file  Other Topics Concern  . Not on file  Social History Narrative  . Not on file     REVIEW OF SYSTEMS:  *** [X]  denotes positive finding, [ ]  denotes negative finding Cardiac  Comments:  Chest pain or chest pressure:    Shortness of breath upon exertion:    Short of breath when lying  flat:    Irregular heart rhythm:        Vascular    Pain in calf, thigh, or hip brought on by ambulation:    Pain in feet at night that wakes you up from your sleep:     Blood clot in your veins:    Leg swelling:         Pulmonary    Oxygen at home:    Productive cough:     Wheezing:         Neurologic    Sudden weakness in arms or legs:     Sudden numbness in arms or legs:     Sudden onset of difficulty speaking or slurred speech:    Temporary loss of vision in one eye:     Problems with dizziness:         Gastrointestinal    Blood in stool:     Vomited blood:         Genitourinary    Burning when urinating:     Blood in urine:        Psychiatric    Major depression:         Hematologic    Bleeding problems:    Problems with blood clotting too easily:        Skin    Rashes or ulcers:        Constitutional    Fever or chills:      PHYSICAL EXAMINATION:  ***  General:  WDWN in NAD; vital signs documented above Gait: Not observed HENT: WNL, normocephalic Pulmonary: normal non-labored breathing , without Rales, rhonchi,  wheezing Cardiac: {Desc; regular/irreg:14544} HR, without  Murmurs; {With/Without:20273} carotid bruit*** Abdomen: soft, NT, no masses Skin: {With/Without:20273} rashes Vascular Exam/Pulses:  Right Left  Radial {Exam; arterial pulse strength 0-4:30167} {Exam; arterial pulse strength 0-4:30167}  Ulnar {Exam; arterial pulse strength 0-4:30167} {Exam; arterial pulse strength 0-4:30167}  Femoral {Exam; arterial pulse strength 0-4:30167} {Exam; arterial pulse strength 0-4:30167}  Popliteal {Exam; arterial pulse strength 0-4:30167}  {Exam; arterial pulse strength 0-4:30167}  DP {Exam; arterial pulse strength 0-4:30167} {Exam; arterial pulse strength 0-4:30167}  PT {Exam; arterial pulse strength 0-4:30167} {Exam; arterial pulse strength 0-4:30167}   Extremities: {With/Without:20273} ischemic changes, {With/Without:20273} Gangrene , {With/Without:20273} cellulitis; {With/Without:20273} open wounds;  Musculoskeletal: no muscle wasting or atrophy  Neurologic: A&O X 3;  No focal weakness or paresthesias are detected Psychiatric:  The pt has {Desc; normal/abnormal:11317::"Normal"} affect.   Non-Invasive Vascular Imaging:   ABI's/TBI's on ***: Right:  *** Left:  ***  Previous ABI's/TBI's on 03/01/18: Right:  0.66/0.53 Left:  0.89/0.67  Pt meds includes: Statin:  {yes no:314532} Beta Blocker:  {yes no:314532} Aspirin:  {yes no:314532} ACEI:  {yes no:314532} ARB:  {yes no:314532} CCB use:  {yes/no:20286} Other Antiplatelet/Anticoagulant:  {yes/no:20286}***   ASSESSMENT/PLAN:: 68 y.o. male here for follow up for hx of rotational atherectomy right AT,rotational atherectomy right popliteal artery, anddrug coated balloon angioplasty of right popliteal artery with 88mm impact by Dr. Donzetta Matters on 07-12-16 by for right lower extremity life-limiting claudication.  He also has hx of SFA to BK popliteal bypass with GSV by Dr. Kellie Simmering in 1997, which occluded in 2008.   -***   Leontine Locket, PA-C [facility] [phone]  Clinic MD:   Trula Slade

## 2018-09-02 ENCOUNTER — Encounter (HOSPITAL_COMMUNITY): Payer: BLUE CROSS/BLUE SHIELD

## 2018-09-02 ENCOUNTER — Encounter: Payer: Self-pay | Admitting: Family

## 2018-09-02 ENCOUNTER — Other Ambulatory Visit: Payer: Self-pay | Admitting: Physician Assistant

## 2018-09-02 ENCOUNTER — Ambulatory Visit: Payer: BLUE CROSS/BLUE SHIELD | Admitting: Family

## 2018-09-25 DIAGNOSIS — D2261 Melanocytic nevi of right upper limb, including shoulder: Secondary | ICD-10-CM | POA: Diagnosis not present

## 2018-09-25 DIAGNOSIS — D2262 Melanocytic nevi of left upper limb, including shoulder: Secondary | ICD-10-CM | POA: Diagnosis not present

## 2018-09-25 DIAGNOSIS — Z85828 Personal history of other malignant neoplasm of skin: Secondary | ICD-10-CM | POA: Diagnosis not present

## 2018-09-25 DIAGNOSIS — D0471 Carcinoma in situ of skin of right lower limb, including hip: Secondary | ICD-10-CM | POA: Diagnosis not present

## 2018-09-25 DIAGNOSIS — L821 Other seborrheic keratosis: Secondary | ICD-10-CM | POA: Diagnosis not present

## 2018-09-26 ENCOUNTER — Ambulatory Visit (INDEPENDENT_AMBULATORY_CARE_PROVIDER_SITE_OTHER): Payer: BLUE CROSS/BLUE SHIELD | Admitting: Neurology

## 2018-09-26 ENCOUNTER — Encounter: Payer: Self-pay | Admitting: Neurology

## 2018-09-26 VITALS — BP 132/81 | HR 76 | Ht 72.0 in | Wt 223.0 lb

## 2018-09-26 DIAGNOSIS — R51 Headache: Secondary | ICD-10-CM | POA: Diagnosis not present

## 2018-09-26 DIAGNOSIS — I679 Cerebrovascular disease, unspecified: Secondary | ICD-10-CM

## 2018-09-26 DIAGNOSIS — R519 Headache, unspecified: Secondary | ICD-10-CM

## 2018-09-26 DIAGNOSIS — G8929 Other chronic pain: Secondary | ICD-10-CM

## 2018-09-26 DIAGNOSIS — G4733 Obstructive sleep apnea (adult) (pediatric): Secondary | ICD-10-CM

## 2018-09-26 DIAGNOSIS — G441 Vascular headache, not elsewhere classified: Secondary | ICD-10-CM | POA: Diagnosis not present

## 2018-09-26 NOTE — Progress Notes (Signed)
[facility]    Provider:  Dr Jaynee Eagles Referring Provider: Caren Macadam, MD Primary Care Provider:  Caren Macadam, MD  CC:  Chronic headaches  HPI:  [name] is a 68 y.o. male here as requested by provider Vickii Penna, MD for chronic headaches. PMHx PVD, HTN, HLD, headache, migraine, depression, CAD, obesity.  Surgery started 8 years ago after his AVR. Felt like a sinus headache at first, could have been in the morning. He can wake up with them during the night and in the morning (doesn't wake him up). In the frontal areas, pressure, no light or sensitivity, no nausea or vomiting. They can last all day off and on. Taking advil 4x a week. He wakes up often to urinate. His sinuses are dry at night and he has to use saline solution to moisten. He feels pretty good. He nods off and he feels like having a nap for no apparent reason. No other focal neurologic deficits, associated symptoms, inciting events or modifiable factors.  Reviewed notes, labs and imaging from outside physicians, which showed:  68 year old male who presented to Dr. Melina Copa for follow-up on his chronic headaches.  He is followed by vascular surgery at [facility] health vein and vascular and Dr. Sherren Mocha in cardiology.  He is also followed at [facility] rheumatology.  He is had chronic headaches since 2012.  Reports that his headaches are located across the front of the head on the left side behind the eyeballs.  They bother him continually.  Years ago he did have migraines.  He does not get aura, vision changes, eye watering, numbness tingling, nausea or vomiting.  He started back on Advil because of the headaches.  Tylenol does not help.  He takes Advil about 4 times a week 3 pills each time 200 mg.  They tend to get worse if he sneezes.  Wakes up with headaches and wakes up in the night with headaches.  Usually start in the night afternoon but also in the mornings.  Nothing makes headaches worse.  No  alcohol.  Not sure if he snores.  Does not have daytime fatigue.  He did have a CT scan of the sinuses.   08/01/2018 labs BUN 13 Creatinine 1.05.   CT Head 04/17/2016: No acute intracranial abnormalities including mass lesion or mass effect, hydrocephalus, extra-axial fluid collection, midline shift, hemorrhage, or acute infarction, large ischemic events (personally reviewed images)   Review of Systems: Patient complains of symptoms per HPI as well as the following symptoms: headache, sleepiness, snoring, change in appetite, flushing, snoring. Pertinent negatives and positives per HPI. All others negative.   Social History   Socioeconomic History  . Marital status: Divorced    Spouse name: Not on file  . Number of children: 4  . Years of education: Not on file  . Highest education level: Bachelor's degree (e.g., BA, AB, BS)  Occupational History  . Occupation: Horticulturist, commercial: [employer]  . Financial resource strain: Not on file  . Food insecurity:    Worry: Not on file    Inability: Not on file  . Transportation needs:    Medical: Not on file    Non-medical: Not on file  Tobacco Use  . Smoking status: Never Smoker  . Smokeless tobacco: Never Used  Substance and Sexual Activity  . Alcohol use: No  . Drug use: No  . Sexual activity: Not on file  Lifestyle  . Physical  activity:    Days per week: Not on file    Minutes per session: Not on file  . Stress: Not on file  Relationships  . Social connections:    Talks on phone: Not on file    Gets together: Not on file    Attends religious service: Not on file    Active member of club or organization: Not on file    Attends meetings of clubs or organizations: Not on file    Relationship status: Not on file  . Intimate partner violence:    Fear of current or ex partner: Not on file    Emotionally abused: Not on file    Physically abused: Not on file    Forced sexual activity: Not on file  Other  Topics Concern  . Not on file  Social History Narrative   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   Left handed   Caffeine: about 2 cups daily    Family History  Problem Relation Age of Onset  . Cancer Father   . Cancer Sister   . Healthy Brother   . Heart disease Maternal Grandmother   . Healthy Maternal Grandfather   . Migraines Neg Hx   . Headache Neg Hx     Past Medical History:  Diagnosis Date  . Aortic stenosis    a. severe bicuspid AV stenosis s/p pericardial tissue valve 2012.  . Arthritis   . Coronary artery disease    a. s/p CABG (LIMA-LCx) at time of AVR 2012, mild nonobstructive LAD and RCA stenoses.  . Depression   . Femoral-popliteal bypass graft occlusion, right [facility]) 1997   Dr. Kellie Simmering  . GERD (gastroesophageal reflux disease)   . H/O migraine    up to his 34s  . Headache(784.0)   . Hematuria    negative workup  . Hyperlipidemia   . Hypertension   . 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  [name] PONV (postoperative nausea and vomiting)   . PVD (peripheral vascular disease) [facility])     Patient Active Problem List   Diagnosis Date Noted  . Chronic intractable headache 09/26/2018  . Hypertension   . Chest pain, somewhat atypical 12/28/2013  . Dyspnea on exertion 12/27/2011  . Dizziness 08/15/2011  . Heart valve replaced by other means 07/28/2011  . Aortic stenosis, severe 07/18/2011  . Coronary artery disease 07/06/2011  . Peripheral vascular disease ([place]) 07/06/2011  . Hyperlipidemia 07/06/2011    Past Surgical History:  Procedure Laterality Date  . AORTIC VALVE REPLACEMENT  07/18/2011   Procedure: AORTIC VALVE REPLACEMENT (AVR);  Surgeon: Grace Isaac, MD;  Location: [place];  Service: Open Heart Surgery;  Laterality: N/A;  . CARDIAC CATHETERIZATION  2009, 07/11/11   Dr. Irish Lack  . CORONARY ARTERY BYPASS GRAFT  07/18/2011   Procedure: CORONARY ARTERY BYPASS GRAFTING (CABG);  Surgeon: Grace Isaac, MD;   Location: [place];  Service: Open Heart Surgery;  Laterality: N/A;  times one to mammary artery, left  . fem stent Right 2017  . FEMORAL ARTERY - POPLITEAL ARTERY BYPASS GRAFT    . KNEE ARTHROSCOPY     left  . MRI     to visualize aortic valve  . PERIPHERAL VASCULAR CATHETERIZATION N/A 07/12/2016   Procedure: Abdominal Aortogram w/Lower Extremity;  Surgeon: Waynetta Sandy, MD;  Location: [place] [facility];  Service: Cardiovascular;  Laterality: N/A;  . PERIPHERAL VASCULAR CATHETERIZATION Right 07/12/2016  Procedure: Peripheral Vascular Atherectomy;  Surgeon: Waynetta Sandy, MD;  Location: [place] [facility];  Service: Cardiovascular;  Laterality: Right;  Anterior Tibial and Popliteal  . ROTATOR CUFF REPAIR     Left  . US ECHOCARDIOGRAPHY      Current Outpatient Medications  Medication Sig Dispense Refill  . Adalimumab (HUMIRA PEN) 40 MG/0.8ML PNKT Inject 40 mg into the skin every 14 (fourteen) days.    [name] amoxicillin (AMOXIL) 500 MG capsule TAKE 4 CAPSULES ONE HOUR BEFORE DENTAL APPOINTMENT. 8 capsule 0  . aspirin 81 MG tablet Take 1 tablet (81 mg total) by mouth daily.    [name] atorvastatin (LIPITOR) 80 MG tablet TAKE ONE TABLET AT BEDTIME. 30 tablet 6  . cilostazol (PLETAL) 100 MG tablet Take 1 tablet (100 mg total) by mouth 2 (two) times daily before a meal. 60 tablet 11  . citalopram (CELEXA) 40 MG tablet Take 40 mg by mouth daily at 12 noon.     . fluticasone (FLONASE) 50 MCG/ACT nasal spray Place 1-2 sprays into both nostrils daily as needed for allergies or rhinitis.    . furosemide (LASIX) 20 MG tablet Take 1 tablet (20 mg total) by mouth daily. 30 tablet 11  . ibuprofen (ADVIL,MOTRIN) 200 MG tablet Take 600 mg by mouth every 6 (six) hours as needed for headache or moderate pain.    [name] lansoprazole (PREVACID) 15 MG capsule Take 15 mg by mouth daily as needed. For acid reflux    . metoprolol tartrate (LOPRESSOR) 50 MG tablet TAKE (1/2) TABLET TWICE DAILY. 30 tablet 11   . pantoprazole (PROTONIX) 40 MG tablet as needed.     . Riboflavin (VITAMIN B-2 PO) Take 400 mg by mouth daily.    . sodium chloride (OCEAN) 0.65 % SOLN nasal spray Place 1 spray into both nostrils as needed for congestion.    . tamsulosin (FLOMAX) 0.4 MG CAPS capsule Take 0.4 mg by mouth daily.      No current facility-administered medications for this visit.     Allergies as of 09/26/2018 - Review Complete 09/26/2018  Allergen Reaction Noted  . Clopidogrel Itching 08/18/2016  . Codeine Other (See Comments) 07/06/2011  . Flexeril [cyclobenzaprine]  09/26/2018  . Morphine and related Itching 07/14/2011  . Septra [sulfamethoxazole-trimethoprim]  09/26/2018    Vitals: BP 132/81 (BP Location: Right Arm, Patient Position: Sitting)   Pulse 76   Ht 6' (1.829 m)   Wt 223 lb (101.2 kg)   BMI 30.24 kg/m  Last Weight:  Wt Readings from Last 1 Encounters:  09/26/18 223 lb (101.2 kg)   Last Height:   Ht Readings from Last 1 Encounters:  09/26/18 6' (1.829 m)     Physical exam: Exam: Gen: NAD, conversant, well nourised, obese, well groomed                     CV: RRR, no MRG. No Carotid Bruits. No peripheral edema, warm, nontender Eyes: Conjunctivae clear without exudates or hemorrhage  Neuro: Detailed Neurologic Exam  Speech:    Speech is normal; fluent and spontaneous with normal comprehension.  Cognition:    The patient is oriented to person, place, and time;     recent and remote memory intact;     language fluent;     normal attention, concentration,     fund of knowledge Cranial Nerves:    The pupils are equal, round, and reactive to light. The fundi areflat.  Visual fields are full to  finger confrontation. Extraocular movements are intact. Trigeminal sensation is intact and the muscles of mastication are normal. The face is symmetric. The palate elevates in the midline. Hearing intact. Voice is normal. Shoulder shrug is normal. The tongue has normal motion without  fasciculations.   Coordination:    Normal finger to nose and heel to shin. Normal rapid alternating movements.   Gait:    Heel-toe and tandem gait are normal.   Motor Observation:    No asymmetry, no atrophy, and no involuntary movements noted. Tone:    Normal muscle tone.    Posture:    Posture is normal. normal erect    Strength:    Strength is V/V in the upper and lower limbs.      Sensation: intact to LT     Reflex Exam:  DTR's:    Deep tendon reflexes in the upper and lower extremities are normal bilaterally.   Toes:    The toes are downgoing bilaterally.   Clonus:    Clonus is absent.    Assessment/Plan: This is a really lovely patient here for chronic headaches.  Patient reports chronic intractable headaches, he can wake up with them during the night and in the morning, they can occur later in the day as well, no migrainous features, he wakes often to urinate, his sinuses are dry at night and he has used saline solution to moisten.  He does not off during the day and feels like he has to nap. Mallampati 4 (cannot see uvula when he opens mouth)  May very well be obstructive sleep apnea however given his concerning symptoms he should also have an MRI of the brain in addition to a sleep evaluation.  MRI of the brain w/wo contrast:MRI brain due to concerning symptoms of morning headaches, nocturnal headaches,intractable headaches after the age of 34  to look for space occupying mass, intracranial lesion, cerebrovascular disease or other etiology.    Orders Placed This Encounter  Procedures  . MR BRAIN W WO CONTRAST  . Ambulatory referral to Sleep Studies    Cc:  Caren Macadam, MD  Sarina Ill, MD  [facility] [address] [place], [place] [address]  Phone [phone] Fax [phone]

## 2018-09-26 NOTE — Patient Instructions (Signed)
MRI of the brain w/wo contrast Sleep referral  Sleep Apnea Sleep apnea is a condition in which breathing pauses or becomes shallow during sleep. Episodes of sleep apnea usually last 10 seconds or longer, and they may occur as many as 20 times an hour. Sleep apnea disrupts your sleep and keeps your body from getting the rest that it needs. This condition can increase your risk of certain health problems, including:  Heart attack.  Stroke.  Obesity.  Diabetes.  Heart failure.  Irregular heartbeat. There are three kinds of sleep apnea:  Obstructive sleep apnea. This kind is caused by a blocked or collapsed airway.  Central sleep apnea. This kind happens when the part of the brain that controls breathing does not send the correct signals to the muscles that control breathing.  Mixed sleep apnea. This is a combination of obstructive and central sleep apnea. What are the causes? The most common cause of this condition is a collapsed or blocked airway. An airway can collapse or become blocked if:  Your throat muscles are abnormally relaxed.  Your tongue and tonsils are larger than normal.  You are overweight.  Your airway is smaller than normal. What increases the risk? This condition is more likely to develop in people who:  Are overweight.  Smoke.  Have a smaller than normal airway.  Are elderly.  Are male.  Drink alcohol.  Take sedatives or tranquilizers.  Have a family history of sleep apnea. What are the signs or symptoms? Symptoms of this condition include:  Trouble staying asleep.  Daytime sleepiness and tiredness.  Irritability.  Loud snoring.  Morning headaches.  Trouble concentrating.  Forgetfulness.  Decreased interest in sex.  Unexplained sleepiness.  Mood swings.  Personality changes.  Feelings of depression.  Waking up often during the night to urinate.  Dry mouth.  Sore throat. How is this diagnosed? This condition may be  diagnosed with:  A medical history.  A physical exam.  A series of tests that are done while you are sleeping (sleep study). These tests are usually done in a sleep lab, but they may also be done at home. How is this treated? Treatment for this condition aims to restore normal breathing and to ease symptoms during sleep. It may involve managing health issues that can affect breathing, such as high blood pressure or obesity. Treatment may include:  Sleeping on your side.  Using a decongestant if you have nasal congestion.  Avoiding the use of depressants, including alcohol, sedatives, and narcotics.  Losing weight if you are overweight.  Making changes to your diet.  Quitting smoking.  Using a device to open your airway while you sleep, such as: ? An oral appliance. This is a custom-made mouthpiece that shifts your lower jaw forward. ? A continuous positive airway pressure (CPAP) device. This device delivers oxygen to your airway through a mask. ? A nasal expiratory positive airway pressure (EPAP) device. This device has valves that you put into each nostril. ? A bi-level positive airway pressure (BPAP) device. This device delivers oxygen to your airway through a mask.  Surgery if other treatments do not work. During surgery, excess tissue is removed to create a wider airway. It is important to get treatment for sleep apnea. Without treatment, this condition can lead to:  High blood pressure.  Coronary artery disease.  (Men) An inability to achieve or maintain an erection (impotence).  Reduced thinking abilities. Follow these instructions at home:  Make any lifestyle changes that your  health care provider recommends.  Eat a healthy, well-balanced diet.  Take over-the-counter and prescription medicines only as told by your health care provider.  Avoid using depressants, including alcohol, sedatives, and narcotics.  Take steps to lose weight if you are overweight.  If  you were given a device to open your airway while you sleep, use it only as told by your health care provider.  Do not use any tobacco products, such as cigarettes, chewing tobacco, and e-cigarettes. If you need help quitting, ask your health care provider.  Keep all follow-up visits as told by your health care provider. This is important. Contact a health care provider if:  The device that you received to open your airway during sleep is uncomfortable or does not seem to be working.  Your symptoms do not improve.  Your symptoms get worse. Get help right away if:  You develop chest pain.  You develop shortness of breath.  You develop discomfort in your back, arms, or stomach.  You have trouble speaking.  You have weakness on one side of your body.  You have drooping in your face. These symptoms may represent a serious problem that is an emergency. Do not wait to see if the symptoms will go away. Get medical help right away. Call your local emergency services (911 in the [place] Do not drive yourself to the hospital. This information is not intended to replace advice given to you by your health care provider. Make sure you discuss any questions you have with your health care provider. Document Released: 07/07/2002 Document Revised: 02/12/2017 Document Reviewed: 04/26/2015 Elsevier Interactive Patient Education  2019 [employer].

## 2018-09-30 ENCOUNTER — Telehealth: Payer: Self-pay | Admitting: Neurology

## 2018-09-30 NOTE — Telephone Encounter (Signed)
Medicare/BCBS Auth: [id number] (exp. 09/30/18 to 10/30/18) order sent to [facility]. They will reach out to the pt to schedule.

## 2018-10-01 DIAGNOSIS — D123 Benign neoplasm of transverse colon: Secondary | ICD-10-CM | POA: Diagnosis not present

## 2018-10-01 DIAGNOSIS — K449 Diaphragmatic hernia without obstruction or gangrene: Secondary | ICD-10-CM | POA: Diagnosis not present

## 2018-10-01 DIAGNOSIS — K2930 Chronic superficial gastritis without bleeding: Secondary | ICD-10-CM | POA: Diagnosis not present

## 2018-10-01 DIAGNOSIS — D122 Benign neoplasm of ascending colon: Secondary | ICD-10-CM | POA: Diagnosis not present

## 2018-10-01 DIAGNOSIS — K219 Gastro-esophageal reflux disease without esophagitis: Secondary | ICD-10-CM | POA: Diagnosis not present

## 2018-10-01 DIAGNOSIS — D125 Benign neoplasm of sigmoid colon: Secondary | ICD-10-CM | POA: Diagnosis not present

## 2018-10-01 DIAGNOSIS — K644 Residual hemorrhoidal skin tags: Secondary | ICD-10-CM | POA: Diagnosis not present

## 2018-10-01 DIAGNOSIS — K648 Other hemorrhoids: Secondary | ICD-10-CM | POA: Diagnosis not present

## 2018-10-01 DIAGNOSIS — K625 Hemorrhage of anus and rectum: Secondary | ICD-10-CM | POA: Diagnosis not present

## 2018-10-07 DIAGNOSIS — D123 Benign neoplasm of transverse colon: Secondary | ICD-10-CM | POA: Diagnosis not present

## 2018-10-07 DIAGNOSIS — K219 Gastro-esophageal reflux disease without esophagitis: Secondary | ICD-10-CM | POA: Diagnosis not present

## 2018-10-07 DIAGNOSIS — D122 Benign neoplasm of ascending colon: Secondary | ICD-10-CM | POA: Diagnosis not present

## 2018-10-07 DIAGNOSIS — D125 Benign neoplasm of sigmoid colon: Secondary | ICD-10-CM | POA: Diagnosis not present

## 2018-10-09 DIAGNOSIS — H6123 Impacted cerumen, bilateral: Secondary | ICD-10-CM | POA: Diagnosis not present

## 2018-10-09 DIAGNOSIS — E669 Obesity, unspecified: Secondary | ICD-10-CM | POA: Diagnosis not present

## 2018-10-09 DIAGNOSIS — H938X3 Other specified disorders of ear, bilateral: Secondary | ICD-10-CM | POA: Diagnosis not present

## 2018-10-11 ENCOUNTER — Other Ambulatory Visit: Payer: Self-pay

## 2018-10-11 ENCOUNTER — Ambulatory Visit
Admission: RE | Admit: 2018-10-11 | Discharge: 2018-10-11 | Disposition: A | Discharge status: Home / Self Care | Payer: BLUE CROSS/BLUE SHIELD | Source: Ambulatory Visit | Attending: Neurology | Admitting: Neurology

## 2018-10-11 DIAGNOSIS — R51 Headache: Secondary | ICD-10-CM

## 2018-10-11 DIAGNOSIS — I679 Cerebrovascular disease, unspecified: Secondary | ICD-10-CM

## 2018-10-11 DIAGNOSIS — G8929 Other chronic pain: Secondary | ICD-10-CM

## 2018-10-11 DIAGNOSIS — G441 Vascular headache, not elsewhere classified: Secondary | ICD-10-CM

## 2018-10-11 DIAGNOSIS — R519 Headache, unspecified: Secondary | ICD-10-CM

## 2018-10-11 MED ORDER — GADOBENATE DIMEGLUMINE 529 MG/ML IV SOLN
20.0000 mL | Freq: Once | INTRAVENOUS | Status: AC | PRN
  Administered 2018-10-11: 20 mL via INTRAVENOUS

## 2018-10-16 ENCOUNTER — Telehealth: Payer: Self-pay | Admitting: *Deleted

## 2018-10-16 NOTE — Telephone Encounter (Signed)
Called pt & LVM (ok per DPR) advising pt that his MRI brain was unremarkable, no lesions or tumors for example that could be causing his headaches. Reminded patient of his sleep consult with Dr. Brett Fairy on 11/25/2018 and asked for a call back or mychart message if he has any questions. Left office number in message.

## 2018-10-16 NOTE — Telephone Encounter (Signed)
-----   Message from Larey Seat, MD sent at 10/15/2018 10:05 AM EDT ----- DR. Sethi -IMPRESSION:  Unremarkable MRI scan of the brain with and without contrast.

## 2018-10-30 DIAGNOSIS — M25561 Pain in right knee: Secondary | ICD-10-CM | POA: Diagnosis not present

## 2018-10-30 DIAGNOSIS — M25512 Pain in left shoulder: Secondary | ICD-10-CM | POA: Diagnosis not present

## 2018-11-06 DIAGNOSIS — M25512 Pain in left shoulder: Secondary | ICD-10-CM | POA: Diagnosis not present

## 2018-11-07 DIAGNOSIS — M25561 Pain in right knee: Secondary | ICD-10-CM | POA: Diagnosis not present

## 2018-11-11 ENCOUNTER — Ambulatory Visit: Payer: BLUE CROSS/BLUE SHIELD | Admitting: Family

## 2018-11-11 ENCOUNTER — Encounter (HOSPITAL_COMMUNITY): Payer: BLUE CROSS/BLUE SHIELD

## 2018-11-11 DIAGNOSIS — S46012A Strain of muscle(s) and tendon(s) of the rotator cuff of left shoulder, initial encounter: Secondary | ICD-10-CM | POA: Diagnosis not present

## 2018-11-11 DIAGNOSIS — S83241A Other tear of medial meniscus, current injury, right knee, initial encounter: Secondary | ICD-10-CM | POA: Diagnosis not present

## 2018-11-18 ENCOUNTER — Encounter: Payer: Self-pay | Admitting: Neurology

## 2018-11-18 ENCOUNTER — Telehealth: Payer: Self-pay | Admitting: Neurology

## 2018-11-18 NOTE — Telephone Encounter (Signed)
Called the patient to inform them that our office has placed new protocols in place for our office visits. Due to Covid 19 our office is reducing our number of office visits in order to minimize the risk to our patients and healthcare providers.Our office is now providing the capability to offer the patients virtual visits at this time. Informed of what that process looks like and informed that the Virtual visit will still be billed through insurance as such. Due to [place],informed the patient since the appointment is taking place over the phone/internet app, we can't guarantee the security of the phone line. With that said if we do move forward I would have to get verbal consent to completed the call over the phone. Patient gave verbal consent to move forward with the video visit. I have reviewed the patient's chart and made sure that everything is up to date. Patient is also made aware that since this is a video visit we are able to complete the visit but a physical exam is not able to be done since the patient is not present in person. Pt's email is [email or web address]. Pt understands that the [employer] webex software must be downloaded and operational on the device pt plans to use for the visit. Pt verbalized understanding of this information and will states to be ready for the visit at least 15 min prior to the visit.

## 2018-11-25 ENCOUNTER — Other Ambulatory Visit: Payer: Self-pay

## 2018-11-25 ENCOUNTER — Ambulatory Visit (INDEPENDENT_AMBULATORY_CARE_PROVIDER_SITE_OTHER): Payer: BLUE CROSS/BLUE SHIELD | Admitting: Neurology

## 2018-11-25 ENCOUNTER — Encounter: Payer: Self-pay | Admitting: Neurology

## 2018-11-25 DIAGNOSIS — R519 Headache, unspecified: Secondary | ICD-10-CM

## 2018-11-25 DIAGNOSIS — F458 Other somatoform disorders: Secondary | ICD-10-CM

## 2018-11-25 DIAGNOSIS — R51 Headache: Secondary | ICD-10-CM | POA: Diagnosis not present

## 2018-11-25 DIAGNOSIS — G4733 Obstructive sleep apnea (adult) (pediatric): Secondary | ICD-10-CM

## 2018-11-25 DIAGNOSIS — G441 Vascular headache, not elsewhere classified: Secondary | ICD-10-CM

## 2018-11-25 DIAGNOSIS — I739 Peripheral vascular disease, unspecified: Secondary | ICD-10-CM

## 2018-11-25 DIAGNOSIS — I679 Cerebrovascular disease, unspecified: Secondary | ICD-10-CM

## 2018-11-25 DIAGNOSIS — I5032 Chronic diastolic (congestive) heart failure: Secondary | ICD-10-CM

## 2018-11-25 NOTE — Patient Instructions (Signed)
Screening for Sleep Apnea  Sleep apnea is a condition in which breathing pauses or becomes shallow during sleep. Sleep apnea screening is a test to determine if you are at risk for sleep apnea. The test is easy and only takes a few minutes. Your health care provider may ask you to have this test in preparation for surgery or as part of a physical exam. What are the symptoms of sleep apnea? Common symptoms of sleep apnea include:  Snoring.  Restless sleep.  Daytime sleepiness.  Pauses in breathing.  Choking during sleep.  Irritability.  Forgetfulness.  Trouble thinking clearly.  Depression.  Personality changes. Most people with sleep apnea are not aware that they have it. Why should I get screened? Getting screened for sleep apnea can help:  Ensure your safety. It is important for your health care providers to know whether or not you have sleep apnea, especially if you are having surgery or have other long-term (chronic) health conditions.  Improve your health and allow you to get a better night's rest. Restful sleep can help you: ? Have more energy. ? Lose weight. ? Improve high blood pressure. ? Improve diabetes management. ? Prevent stroke. ? Prevent car accidents. How is screening done? Screening usually includes being asked a list of questions about your sleep quality. Some questions you may be asked include:  Do you snore?  Is your sleep restless?  Do you have daytime sleepiness?  Has a partner or spouse told you that you stop breathing during sleep?  Have you had trouble concentrating or memory loss? If your screening test is positive, you are at risk for the condition. Further testing may be needed to confirm a diagnosis of sleep apnea. Where to find more information You can find screening tools online or at your health care clinic. For more information about sleep apnea screening and healthy sleep, visit these websites:  Centers for Disease Control and  Prevention: [employer]pl  American Sleep Apnea Association: www.sleepapnea.org Contact a health care provider if:  You think that you may have sleep apnea. Summary  Sleep apnea screening can help determine if you are at risk for sleep apnea.  It is important for your health care providers to know whether or not you have sleep apnea, especially if you are having surgery or have other chronic health conditions.  You may be asked to take a screening test for sleep apnea in preparation for surgery or as part of a physical exam. This information is not intended to replace advice given to you by your health care provider. Make sure you discuss any questions you have with your health care provider. Document Released: 10/27/2016 Document Revised: 10/27/2016 Document Reviewed: 10/27/2016 Elsevier Interactive Patient Education  2019 [place]. Hypoxia Hypoxia is a condition that happens when there is a lack of oxygen in the body's tissues and organs. When there is not enough oxygen, organs cannot work as they should. This causes serious problems throughout the body and in the brain. What are the causes? This condition may be caused by:  Exposure to high altitude.  A collapsed lung (pneumothorax).  Lung infection (pneumonia). Lung injury.  Long-term (chronic) lung disease, such as COPD (chronic obstructive pulmonary disease).  Food, saliva, or vomit getting into the airway (aspiration).  Reduced blood flow (ischemia).  Slow or shallow breathing (hypoventilation) or OSA ( Sleep Apnea) .  Blood disorders, such as anemia. What are the signs or symptoms? Symptoms of this condition include:  Headache.  Fatigue.  Drowsiness.  Forgetfulness.  Nausea.  Confusion.  Shortness of breath.  Dizziness.  Change in consciousness or awareness. If hypoxia is not treated, it can lead to convulsions, loss of consciousness (coma), or brain damage. How is this diagnosed?  This condition may be diagnosed based on:  OSA - By Home sleep test with pulse-oximetry  You may have other tests to determine the cause of your hypoxia. How is this treated?  If apnea and/ or hypoxemia are present a CPAP device is usually used to improve breathing during sleep /

## 2018-11-25 NOTE — Progress Notes (Signed)
Virtual Visit via Video Note  Referral by Dr Jaynee Eagles, MD   I connected with [name] on 11/25/18 at 11:00 AM EDT by a video enabled telemedicine application and verified that I am speaking with the correct person using two identifiers.   I discussed the limitations of evaluation and management by telemedicine and the availability of in person appointments. The patient expressed understanding and agreed to proceed.   SLEEP MEDICINE CLINIC   Provider:  Larey Seat, M D  Primary Care Physician:  Caren Macadam, MD   Referring Provider: Sarina Ill, MD    HPI:  [name] is a 68 y.o. male , seen here as in a referral  from Dr. Jaynee Eagles o 11-25-2018.  Chief complaint according to patient : long standing history of migraine and sleep HA in AM.  He has not being told he snores or has apnea, but suspeced there could be low oxygen at night due to other symptoms.   Sleep and medical history; Active list positive for aortic stenosis occasional dizziness, artificial heart valve, peripheral vascular disease, intractable headaches with some migrainous character.  The diagnostic code was chosen as vascular migraines or vascular headache.  History of severe bicuspid aortic valve stenosis and pericardial tissue valve was placed in 2012.  The patient has a history of rheumatic arthritis, coronary artery disease mild and nonobstructive.  History of depression, femoral femoral popliteal bypass graft with occlusion in 1997, history of acid reflux disease history of right-sided retro-orbital migraines associated with visual aura, blurring and bright light flickering lights, which changed in his 40s from a maximum occurrence over the last 20 years or less and less frequency.  Hyperlipidemia, hematuria, hypertension, and near syncope.  He also has a history of peripheral vascular disease.      In January 2020 and his creatinine level rose to 1.05, he had a CT of the head in 2017 without  intracranial abnormalities, he is followed by a [facility]ology and by Dr. Sherren Mocha in cardiology.  His current chronic headaches have been present since 2012.  He reports that the headaches are causing him to have right retro-orbital pain a pressure sensation they have not bothered him continually but sometimes he wakes up in the morning with an additional level of pain.  He denies being woken up by the headache.he reports waking up sleep talking infrequently, but experiences vivid dreams. Not sure he snores, but frequent nocturia.    Family sleep history: no OSA known. Healthy children, parents were never tested, he does not believe there was OSA.    Social history: adult children, 82 and 33 year old twins, and youngest is 32.  4 grandchildren.    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  No ETOh, no tobacco use. Caffeine ; 2 coffees in AM,  frequent sodas in daytime - 3-4 glasses of 16 ounces.  Eating take out a lot, not a cook.    Sleep habits are as follows: [name] responded that he does not have a regular dinner time he often picks up food or brings take out home.  His bedtime can be as early as 930 and as late as 11 and averages probably 10:30 PM.  His bedroom is described as cool, quiet and dark and he sleeps alone.  He uses a fan which keeps him cooler but also provides background white noise.  He prefers to sleep on his side but sometimes  ends up sleeping supine, using 3 pillows to prop up his head and often a body pillow to help.  He has nocturia frequently 3-5 times at night and is often awake after that for a long time before he is able to return to sleep.  So while he does not have trouble initiating sleep he cannot sleep through the night.  He sleeps only about 2 hours on block and then experiences fragmented sleep by nocturia.  He is on diuretics.  He rises in the morning at about 645.  But he does not use a TV in his bedroom  he does look at his smart phone when he comes back from the bathroom and this may influence the length of his wakefulness at night.  Review of Systems: Out of a complete 14 system review, the patient complains of only the following symptoms, and all other reviewed systems are negative. How likely are you to doze in the following situations: 0 = not likely, 1 = slight chance, 2 = moderate chance, 3 = high chance  Sitting and Reading? 1 Watching Television? 1 Sitting inactive in a public place (theater or meeting)?1 Lying down in the afternoon when circumstances permit?3 Sitting and talking to someone?0 Sitting quietly after lunch without alcohol?0 In a car, while stopped for a few minutes in traffic?0 As a passenger in a car for an hour without a break? 1  Total =  8  Epworth score endorsed at 8 /24 points., nocturia, 4-5 times, headaches with retro-orbital pressure on the right eye.    Social History   Socioeconomic History   Marital status: Divorced    Spouse name: Not on file   Number of children: 4   Years of education: Not on file   Highest education level: Bachelor's degree (e.g., BA, AB, BS)  Occupational History   Occupation: Horticulturist, commercial: Paramedic strain: Not on file   Food insecurity:    Worry: Not on file    Inability: Not on file   Transportation needs:    Medical: Not on file    Non-medical: Not on file  Tobacco Use   Smoking status: Never Smoker   Smokeless tobacco: Never Used  Substance and Sexual Activity   Alcohol use: No   Drug use: No   Sexual activity: Not on file  Lifestyle   Physical activity:    Days per week: Not on file    Minutes per session: Not on file   Stress: Not on file  Relationships   Social connections:    Talks on phone: Not on file    Gets together: Not on file    Attends religious service: Not on file    Active member of club or organization: Not on file     Attends meetings of clubs or organizations: Not on file    Relationship status: Not on file   Intimate partner violence:    Fear of current or ex partner: Not on file    Emotionally abused: Not on file    Physically abused: Not on file    Forced sexual activity: Not on file  Other Topics Concern   Not on file  Social History Narrative   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   Left handed   Caffeine: about 2 cups daily    Family History  Problem Relation Age of Onset   Cancer Father    Cancer  Sister    Healthy Brother    Heart disease Maternal Grandmother    Healthy Maternal Grandfather    Migraines Neg Hx    Headache Neg Hx     Past Medical History:  Diagnosis Date   Aortic stenosis    a. severe bicuspid AV stenosis s/p pericardial tissue valve 2012.   Arthritis    Coronary artery disease    a. s/p CABG (LIMA-LCx) at time of AVR 2012, mild nonobstructive LAD and RCA stenoses.   Depression    Femoral-popliteal bypass graft occlusion, right [facility]) 1997   Dr. 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   PONV (postoperative nausea and vomiting)    PVD (peripheral vascular disease) [facility])     Past Surgical History:  Procedure Laterality Date   AORTIC VALVE REPLACEMENT  07/18/2011   Procedure: AORTIC VALVE REPLACEMENT (AVR);  Surgeon: Grace Isaac, MD;  Location: [place];  Service: Open Heart Surgery;  Laterality: N/A;   CARDIAC CATHETERIZATION  2009, 07/11/11   Dr. Irish Lack   CORONARY ARTERY BYPASS GRAFT  07/18/2011   Procedure: CORONARY ARTERY BYPASS GRAFTING (CABG);  Surgeon: Grace Isaac, MD;  Location: [place];  Service: Open Heart Surgery;  Laterality: N/A;  times one to mammary artery, left   fem stent Right 2017   FEMORAL ARTERY -  POPLITEAL ARTERY BYPASS GRAFT     KNEE ARTHROSCOPY     left   MRI     to visualize aortic valve   PERIPHERAL VASCULAR CATHETERIZATION N/A 07/12/2016   Procedure: Abdominal Aortogram w/Lower Extremity;  Surgeon: Waynetta Sandy, MD;  Location: [place] CV LAB;  Service: Cardiovascular;  Laterality: N/A;   PERIPHERAL VASCULAR CATHETERIZATION Right 07/12/2016   Procedure: Peripheral Vascular Atherectomy;  Surgeon: Waynetta Sandy, MD;  Location: [place] [facility];  Service: Cardiovascular;  Laterality: Right;  Anterior Tibial and Popliteal   ROTATOR CUFF REPAIR     Left   US ECHOCARDIOGRAPHY      Current Outpatient Medications  Medication Sig Dispense Refill   Adalimumab (HUMIRA PEN) 40 MG/0.8ML PNKT Inject 40 mg into the skin every 14 (fourteen) days.     amoxicillin (AMOXIL) 500 MG capsule TAKE 4 CAPSULES ONE HOUR BEFORE DENTAL APPOINTMENT. 8 capsule 0   aspirin 81 MG tablet Take 1 tablet (81 mg total) by mouth daily.     atorvastatin (LIPITOR) 80 MG tablet TAKE ONE TABLET AT BEDTIME. 30 tablet 6   cilostazol (PLETAL) 100 MG tablet Take 1 tablet (100 mg total) by mouth 2 (two) times daily before a meal. 60 tablet 11   citalopram (CELEXA) 40 MG tablet Take 40 mg by mouth daily at 12 noon.      fluticasone (FLONASE) 50 MCG/ACT nasal spray Place 1-2 sprays into both nostrils daily as needed for allergies or rhinitis.     furosemide (LASIX) 20 MG tablet Take 1 tablet (20 mg total) by mouth daily. 30 tablet 11   ibuprofen (ADVIL,MOTRIN) 200 MG tablet Take 600 mg by mouth every 6 (six) hours as needed for headache or moderate pain.     lansoprazole (PREVACID) 15 MG capsule Take 15 mg by mouth daily as needed. For acid reflux  metoprolol tartrate (LOPRESSOR) 50 MG tablet TAKE (1/2) TABLET TWICE DAILY. 30 tablet 11   pantoprazole (PROTONIX) 40 MG tablet as needed.      Riboflavin (VITAMIN B-2 PO) Take 400 mg by mouth daily.     sodium chloride (OCEAN)  0.65 % SOLN nasal spray Place 1 spray into both nostrils as needed for congestion.     tamsulosin (FLOMAX) 0.4 MG CAPS capsule Take 0.4 mg by mouth daily.      No current facility-administered medications for this visit.     Allergies as of 11/25/2018 - Review Complete 11/18/2018  Allergen Reaction Noted   Clopidogrel Itching 08/18/2016   Codeine Other (See Comments) 07/06/2011   Flexeril [cyclobenzaprine]  09/26/2018   Morphine and related Itching 07/14/2011   Septra [sulfamethoxazole-trimethoprim]  09/26/2018    Vitals: There were no vitals taken for this visit. Last Weight:  Wt Readings from Last 1 Encounters:  09/26/18 223 lb (101.2 kg)     Last Height:   Ht Readings from Last 1 Encounters:  09/26/18 6' (1.829 m)    Last BMI 30 kg/m2.   Observations/Objective:  General: The patient is awake, alert and appears not in acute distress. The patient is well groomed. Head: Normocephalic, atraumatic. Neck is supple. Mallampati 4- high Grade.  neck circumference:17"  Nasal airflow patent - can hold his breath. , Retrognathia is seen.  Bruxism marks- has a dental guard.   Respiratory: no SOB- Skin:  Without evidence of edema, or rash Trunk: BMI is stable at 30. The patient's posture is erect   Neurologic exam : The patient is awake and alert, oriented to place and time.   Memory subjective  described as intact.   Attention span & concentration ability appears normal.  Speech is fluent,  without aphasia.   Mood and affect are appropriate.  Cranial nerves: intact smell and taste.  Pupils are equal - he wears a .contact in one eye. Extraocular movements  in vertical and horizontal planes intact No Hearing aids -  Facial motor strength is symmetric and tongue and uvula move midline. Shoulder shrug was symmetrical.   Motor exam:  Normal tone, muscle bulk and symmetric strength in all extremities.  Alternating movements in the fingers/hands -Finger-to-nose maneuver  normal without evidence of ataxia, dysmetria or tremor.  Gait and station: Patient walks without assistive device.   Assessment and Plan:  No hypersomnia reported, but anatomical risk factors for OSA are present, and headaches may be related.  His history of PVD, CAD and aortic stenosis is also concerning, causing possible perfusion deficits, and hypoxemia or syncope.    Plan to evaluate for apnea by HST. Device can be picked-up by patient .  Hypoxemia evaluation needed.  Patient has high grade Mallampati and Bruxism- may benefit from a dental device if OSA is mild and not associated with hypoxemia.    Follow Up Instructions: Rv with me or Dr. Jaynee Eagles after  HST-sleep study.   CC: Dr. Burt Knack, Legrand Como, PCP, A.Jaynee Eagles ,MD     I discussed the assessment and treatment plan with the patient. The patient was provided an opportunity to ask questions and all were answered. The patient agreed with the plan and demonstrated an understanding of the instructions.   The patient was advised to call back or seek an in-person evaluation if the symptoms worsen or if the condition fails to improve as anticipated.  I provided 25 minutes of non-face-to-face time during this encounter.   Larey Seat, MD 11/25/2018, 11:29 AM  Certified in Neurology by ABPN Certified in [place] by [facility] [address] [place], [place] [address]            Larey Seat, MD

## 2018-11-26 DIAGNOSIS — N529 Male erectile dysfunction, unspecified: Secondary | ICD-10-CM | POA: Diagnosis not present

## 2018-12-10 DIAGNOSIS — M1A00X0 Idiopathic chronic gout, unspecified site, without tophus (tophi): Secondary | ICD-10-CM | POA: Diagnosis not present

## 2018-12-10 DIAGNOSIS — D649 Anemia, unspecified: Secondary | ICD-10-CM | POA: Diagnosis not present

## 2018-12-10 DIAGNOSIS — L4050 Arthropathic psoriasis, unspecified: Secondary | ICD-10-CM | POA: Diagnosis not present

## 2018-12-10 DIAGNOSIS — M1712 Unilateral primary osteoarthritis, left knee: Secondary | ICD-10-CM | POA: Diagnosis not present

## 2018-12-10 DIAGNOSIS — Z79899 Other long term (current) drug therapy: Secondary | ICD-10-CM | POA: Diagnosis not present

## 2018-12-30 ENCOUNTER — Other Ambulatory Visit: Payer: Self-pay

## 2018-12-30 ENCOUNTER — Ambulatory Visit (INDEPENDENT_AMBULATORY_CARE_PROVIDER_SITE_OTHER): Payer: BLUE CROSS/BLUE SHIELD | Admitting: Neurology

## 2018-12-30 DIAGNOSIS — F458 Other somatoform disorders: Secondary | ICD-10-CM

## 2018-12-30 DIAGNOSIS — R519 Headache, unspecified: Secondary | ICD-10-CM

## 2018-12-30 DIAGNOSIS — G4733 Obstructive sleep apnea (adult) (pediatric): Secondary | ICD-10-CM | POA: Diagnosis not present

## 2018-12-30 DIAGNOSIS — G441 Vascular headache, not elsewhere classified: Secondary | ICD-10-CM

## 2018-12-30 DIAGNOSIS — G8929 Other chronic pain: Secondary | ICD-10-CM

## 2018-12-30 DIAGNOSIS — I679 Cerebrovascular disease, unspecified: Secondary | ICD-10-CM

## 2019-01-03 DIAGNOSIS — R3912 Poor urinary stream: Secondary | ICD-10-CM | POA: Diagnosis not present

## 2019-01-03 DIAGNOSIS — N401 Enlarged prostate with lower urinary tract symptoms: Secondary | ICD-10-CM | POA: Diagnosis not present

## 2019-01-08 DIAGNOSIS — D649 Anemia, unspecified: Secondary | ICD-10-CM | POA: Diagnosis not present

## 2019-01-10 DIAGNOSIS — R3912 Poor urinary stream: Secondary | ICD-10-CM | POA: Diagnosis not present

## 2019-01-10 DIAGNOSIS — N401 Enlarged prostate with lower urinary tract symptoms: Secondary | ICD-10-CM | POA: Diagnosis not present

## 2019-01-13 NOTE — Procedures (Signed)
Patient Information     First Name: Seth Last Name: [name] ID: [id number]  Birth Date: 16-Dec-1950 Age: 68 Gender: Male  Referring Provider: Sarina Ill, MD BMI: 30.2 (W=222 lb, H=6' 0'')  Neck Circ.:  17 '' Epworth:  8/24   Sleep Study Information    Study Date: Dec 30, 2018 S/H/A Version: [id number] / 4.1.1528 / 38  History:  [name] is a 68 year old patient of Dr Cathren Laine, seen on 11-25-2018 Patient reported migraine and sleep headaches, nocturia. History of severe bicuspid aortic valve stenosis and pericardial tissue valve was placed in 2012.  The patient has a history of rheumatic arthritis, coronary artery disease mild and non-obstructive.  History of femoral-popliteal bypass graft with occlusion in 1997, history of acid reflux disease history of right-sided retro-orbital migraines associated with visual aura, blurring and bright light flickering lights, which changed in his 40s from a maximum occurrence over the last 20 years or less and less frequency. Hyperlipidemia, hematuria, hypertension, and near syncope.        Summary & Diagnosis:    This HST worked only for the detection of apneas and hypopneas /h ( AHI)  but was not able to detect REM versus NREM sleep.  The AHI at 52.7/h indicates a severe form of Obstructive Sleep Apnea with tachy- bradycardia. There is evidence of snoring by RDI.( Respiratory Disturbance Index).    Recommendations:      This degree of apnea and the associated comorbidities make treatment by CPAP necessary. The patient did not experience prolonged hypoxemia but slow and fast heart rates indicating some physiological stress to be present.  I will order auto CPAP, heated humidity and interface of patient choice. The settings will be 5-18 with 3 cm EPR.  Electronically Signed: Larey Seat, MD   01-13-2019                  Start Study Time: End Study Time: Total Recording Time:   9:35:31 PM   7:14:20 AM 9 h, 38 min   Total Sleep Time Inconclusive REM Detection 8 h, 22 min    Mean: 93 Minimum: 88 Maximum: 99  Mean of Desaturations Nadirs (%):   91  Oxygen Desaturation. %:   4-9 10-20 >20 Total  Events Number Total   246  3 98.8 1.2  0 0.0  249 100.0  Oxygen Saturation: <90 <=88 <85 <80 <70  Duration (minutes): Sleep % 2.3 0.5  0.4 0.0  0.1 0.0 0.0 0.0 0.0 0.0     Respiratory Indices       Total Events REM NREM All Night  pRDI: pAHI: ODI:  428  414  249 N/A N/A N/A N/A N/A N/A 54.5 52.7 31.7       Pulse Rate Statistics during Sleep (BPM)      Mean:  79 Minimum: 36  Maximum: 117    Indices are calculated using technically valid sleep time of  7 hrs, 51 min. Sleep Summary  Oxygen Saturation Statistics   pRDI/pAHI are calculated using oxi desaturations ? 3%            Wake  REM Sleep Stages Chart  pAHI=52.7                                                                                            Mild              Moderate                    Severe                                                 5              15                    30

## 2019-01-13 NOTE — Addendum Note (Signed)
Addended by: Larey Seat on: 01/13/2019 03:50 PM   Modules accepted: Orders

## 2019-01-14 ENCOUNTER — Encounter: Payer: Self-pay | Admitting: Neurology

## 2019-01-14 ENCOUNTER — Telehealth: Payer: Self-pay | Admitting: Neurology

## 2019-01-14 NOTE — Telephone Encounter (Signed)
-----   Message from Larey Seat, MD sent at 01/13/2019  3:50 PM EDT -----  Summary & Diagnosis:   This HST worked only for the detection of apneas and hypopneas /h ( AHI)  but was not able to detect REM versus NREM sleep.  The AHI at 52.7/h indicates a severe form of Obstructive Sleep Apnea with tachy- bradycardia. There is evidence of snoring by RDI.( Respiratory Disturbance Index).    Recommendations:     This degree of apnea and the associated comorbidities make treatment by CPAP necessary. The patient did not experience prolonged hypoxemia but slow and fast heart rates indicating some physiological stress to be present.  I will order auto CPAP, heated humidity and interface of patient choice. The settings will be 5-18 with 3 cm EPR.  Electronically Signed: Larey Seat, MD   01-13-2019

## 2019-01-14 NOTE — Telephone Encounter (Signed)
Called patient to discuss sleep study results. No answer at this time. LVM for the patient to call back.   

## 2019-01-15 ENCOUNTER — Telehealth: Payer: Self-pay | Admitting: Neurology

## 2019-01-15 NOTE — Telephone Encounter (Signed)
I called pt. I advised pt that Dr. Brett Fairy reviewed their sleep study results and found that pt severe sleep apnea. Dr. Brett Fairy recommends that pt should start auto. I reviewed PAP compliance expectations with the pt. Pt is agreeable to starting a CPAP. I advised pt that an order will be sent to a DME, [facility], and [facility] will call the pt within about one week after they file with the pt's insurance. [facility] will show the pt how to use the machine, fit for masks, and troubleshoot the CPAP if needed. A follow up appt was made for insurance purposes with Ward Givens on Aug 27,2020 at 7:30 am. Pt verbalized understanding to arrive 15 minutes early and bring their CPAP. A letter with all of this information in it will be mailed to the pt as a reminder. I verified with the pt that the address we have on file is correct. Pt verbalized understanding of results. Pt had no questions at this time but was encouraged to call back if questions arise. I have sent the order to [facility] and have received confirmation that they have received the order.

## 2019-01-15 NOTE — Telephone Encounter (Signed)
-----   Message from Larey Seat, MD sent at 01/13/2019  3:50 PM EDT -----  Summary & Diagnosis:   This HST worked only for the detection of apneas and hypopneas /h ( AHI)  but was not able to detect REM versus NREM sleep.  The AHI at 52.7/h indicates a severe form of Obstructive Sleep Apnea with tachy- bradycardia. There is evidence of snoring by RDI.( Respiratory Disturbance Index).    Recommendations:     This degree of apnea and the associated comorbidities make treatment by CPAP necessary. The patient did not experience prolonged hypoxemia but slow and fast heart rates indicating some physiological stress to be present.  I will order auto CPAP, heated humidity and interface of patient choice. The settings will be 5-18 with 3 cm EPR.  Electronically Signed: Larey Seat, MD   01-13-2019

## 2019-01-15 NOTE — Telephone Encounter (Signed)
Patient had called back as I was rooming a pt and I told him I would call back. I have attempted 3 times to call and it continues to go straight to VM. I LVM informing patient of this

## 2019-01-21 DIAGNOSIS — G4733 Obstructive sleep apnea (adult) (pediatric): Secondary | ICD-10-CM | POA: Diagnosis not present

## 2019-02-03 ENCOUNTER — Telehealth: Payer: Self-pay

## 2019-02-03 NOTE — Telephone Encounter (Signed)
Pt called to report over the last 2 to 3 nights he wakes up gasping for breath while using his CPAP.  Pt states this happens 2 -4 times per night right now. Pt states he has been using his CPAP for the last 30 days and thinks this may be related to his mask. Pt states his mask feels ok but it is not comfortable to him. Pt was advised I would fwd message to nurse. Best call back # is [phone].

## 2019-02-03 NOTE — Telephone Encounter (Signed)
Patient called back and I advised I did get the download right after I left a message. I reviewed this and Dr Brett Fairy reviewed as well. There are apneas still present and it could be related to the mask because it is showing that he is having leaks present. Dr Dohmeier recommends the patient call [facility] and see about a different mask for him. Pt verbalized understanding and will reach out to [facility] tomorrow.

## 2019-02-03 NOTE — Telephone Encounter (Signed)
I have attempted to review the patient's download and do not see it in airview or care orchestrator. I reached out to [facility] to tag our office. Once I hear from them I will pull a download and have Dr Dohmeier review and call the patient back with what she recommends.

## 2019-02-03 NOTE — Telephone Encounter (Signed)
[facility] responded and stated "We are working on getting [employer] to release this pt's machine to [place]. Once we have him in [employer], we will let you know! "  I am waiting for them to tag him to the machine and then we can pull his download to see if we can figure our what is causing him to have these episodes.

## 2019-02-13 ENCOUNTER — Other Ambulatory Visit: Payer: Self-pay

## 2019-02-13 ENCOUNTER — Ambulatory Visit: Payer: BLUE CROSS/BLUE SHIELD | Admitting: Family

## 2019-02-13 ENCOUNTER — Ambulatory Visit (HOSPITAL_COMMUNITY)
Admission: RE | Admit: 2019-02-13 | Discharge: 2019-02-13 | Disposition: A | Discharge status: Home / Self Care | Payer: BLUE CROSS/BLUE SHIELD | Source: Ambulatory Visit | Attending: Family | Admitting: Family

## 2019-02-13 ENCOUNTER — Encounter: Payer: Self-pay | Admitting: Family

## 2019-02-13 VITALS — BP 110/64 | HR 68 | Temp 97.9°F | Resp 12 | Ht 72.0 in | Wt 224.0 lb

## 2019-02-13 DIAGNOSIS — I779 Disorder of arteries and arterioles, unspecified: Secondary | ICD-10-CM | POA: Diagnosis not present

## 2019-02-13 DIAGNOSIS — Z9862 Peripheral vascular angioplasty status: Secondary | ICD-10-CM

## 2019-02-13 NOTE — Progress Notes (Signed)
Virtual Visit via Telephone Note   I connected with [name] on 02/13/2019 using the [employer] by telephone. Pt did not answer. His voicemail message states his name. I am located at the [facility] office.  PCP: Caren Macadam, MD  Chief Complaint: follow up PAD  History of Present Illness: [name] is a 68 y.o. male who is s/protational atherectomy right AT,rotational atherectomy right popliteal artery, anddrug coated balloon angioplasty of right popliteal artery with 19mm impact by Dr. Donzetta Matters on 07-12-16 by for right lower extremity life-limiting claudication.  Hepreviously underwent right superficial femoral artery to below-knee popliteal bypass with great saphenous vein by Dr. Kellie Simmering in 1997. His bypass graft occluded in 2008. Previous to this he was last seen in the office in 2009 where he had stable calf claudication. He was encouraged to continue staying active.   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 He denies any nonhealing wounds or rest pain. He denies any issues with his left leg.  He has never been a smoker. He is not diabetic. He has had hypertension that is well controlled. He is on a statin for hyperlipidemia. He previously had aortic stenosis status post AVR which is stable by recent echo. He has CAD s/p CABG (LIMA-LCx).He is not on any blood thinners. His mother had peripheral vascular disease.  He had an episode of near syncope while officiating football in 65 heat in September 2017. A head CT scan was negative. It was felt that his symptoms were secondary to dehydration and heat.   He previously had a carotid duplex in 2014 with less than 40% bilateral internal carotid artery stenosis.  At his August 2019 visit, fter about walking 1/4 mile his right calf cramps, thenresolves with more walking. He was walking about 30-24minutes,almost daily; denied any further right foot burning sensation at that time.He was also riding his bicycle.  His left knee pain was not limiting his walking since he receives cortisone injections. He had a meniscus tear repaired in the 1990's, left knee.  At his August 2019 visit he denied dizziness, denies dyspnea, denies feeling any more tired than usual, denies chest pain.  He was evaluated by Dr. Burt Knack in January 2018 for asymptomatic cardiac arrhythmia, sinus arrhythmiawas demonstrated on EKG; he apparently has a history of asymptomatic sinus arrhythmia. He denied ever having an MI, denied any CHF.   At his August 2019 visit he denied any known history of stroke or TIA.  Diabetic:No Tobacco IRJ:JOACZYS smokeless tobacco in his late 20's, never smoked  Pt meds include: Statin :Yes Betablocker:Yes ASA:Yes, 325 mg daily Other anticoagulants/antiplatelets:no, Plavix caused itching    Past Medical History:  Diagnosis Date  . Aortic stenosis    a. severe bicuspid AV stenosis s/p pericardial tissue valve 2012.  . Arthritis   . Coronary artery disease    a. s/p CABG (LIMA-LCx) at time of AVR 2012, mild nonobstructive LAD and RCA stenoses.  . Depression   . Femoral-popliteal bypass graft occlusion, right [facility]) 1997   Dr. Kellie Simmering  . GERD (gastroesophageal reflux disease)   . H/O migraine    up to his 23s  . Headache(784.0)   . Hematuria    negative workup  . Hyperlipidemia   . Hypertension   . 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  [name] PONV (postoperative nausea and vomiting)   . PVD (peripheral vascular disease) [facility])     Past Surgical History:  Procedure Laterality  Date  . AORTIC VALVE REPLACEMENT  07/18/2011   Procedure: AORTIC VALVE REPLACEMENT (AVR);  Surgeon: Grace Isaac, MD;  Location: [place];  Service: Open Heart Surgery;  Laterality: N/A;  . CARDIAC CATHETERIZATION  2009, 07/11/11   Dr. Irish Lack  . CORONARY ARTERY BYPASS GRAFT  07/18/2011   Procedure: CORONARY ARTERY BYPASS GRAFTING (CABG);  Surgeon: Grace Isaac, MD;  Location: [place];  Service: Open Heart Surgery;  Laterality: N/A;  times one to mammary artery, left  . fem stent Right 2017  . FEMORAL ARTERY - POPLITEAL ARTERY BYPASS GRAFT    . KNEE ARTHROSCOPY     left  . MRI     to visualize aortic valve  . PERIPHERAL VASCULAR CATHETERIZATION N/A 07/12/2016   Procedure: Abdominal Aortogram w/Lower Extremity;  Surgeon: Waynetta Sandy, MD;  Location: [place] [facility];  Service: Cardiovascular;  Laterality: N/A;  . PERIPHERAL VASCULAR CATHETERIZATION Right 07/12/2016   Procedure: Peripheral Vascular Atherectomy;  Surgeon: Waynetta Sandy, MD;  Location: [place] [facility];  Service: Cardiovascular;  Laterality: Right;  Anterior Tibial and Popliteal  . ROTATOR CUFF REPAIR     Left  . US ECHOCARDIOGRAPHY      Current Meds  Medication Sig  . Adalimumab (HUMIRA PEN) 40 MG/0.8ML PNKT Inject 40 mg into the skin every 14 (fourteen) days.  [name] amoxicillin (AMOXIL) 500 MG capsule TAKE 4 CAPSULES ONE HOUR BEFORE DENTAL APPOINTMENT.  [name] aspirin 81 MG tablet Take 1 tablet (81 mg total) by mouth daily.  [name] atorvastatin (LIPITOR) 80 MG tablet TAKE ONE TABLET AT BEDTIME.  . cilostazol (PLETAL) 100 MG tablet Take 1 tablet (100 mg total) by mouth 2 (two) times daily before a meal.  . citalopram (CELEXA) 40 MG tablet Take 40 mg by mouth daily at 12 noon.   . fluticasone (FLONASE) 50 MCG/ACT nasal spray Place 1-2 sprays into both nostrils daily as needed for allergies or rhinitis.  . furosemide (LASIX) 20 MG tablet Take 1 tablet (20 mg total) by mouth daily.  [name] ibuprofen (ADVIL,MOTRIN) 200 MG tablet Take 600 mg by mouth every 6 (six) hours as needed for headache or moderate pain.  [name] lansoprazole (PREVACID) 15 MG capsule Take 15 mg by mouth daily as needed. For acid reflux  . metoprolol tartrate (LOPRESSOR) 50 MG tablet TAKE (1/2) TABLET TWICE DAILY.  . pantoprazole (PROTONIX) 40 MG tablet as needed.   . Riboflavin (VITAMIN B-2 PO) Take 400  mg by mouth daily.  . sodium chloride (OCEAN) 0.65 % SOLN nasal spray Place 1 spray into both nostrils as needed for congestion.  . tamsulosin (FLOMAX) 0.4 MG CAPS capsule Take 0.4 mg by mouth daily.      Observations/Objective:  DATA  ABI (Date: 02/13/2019): ABI Findings: +---------+------------------+-----+--------+--------+ Right    Rt Pressure (mmHg)IndexWaveformComment  +---------+------------------+-----+--------+--------+ Brachial 114                                     +---------+------------------+-----+--------+--------+ PTA      84                0.74 biphasic         +---------+------------------+-----+--------+--------+ DP       71                0.62 biphasic         +---------+------------------+-----+--------+--------+ Asencion Partridge  0.49 Abnormal         +---------+------------------+-----+--------+--------+  +---------+------------------+-----+--------+-------+ Left     Lt Pressure (mmHg)IndexWaveformComment +---------+------------------+-----+--------+-------+ Brachial 108                                    +---------+------------------+-----+--------+-------+ PTA      117               1.03 biphasic        +---------+------------------+-----+--------+-------+ DP       108               0.95 biphasic        +---------+------------------+-----+--------+-------+ Great Toe70                0.61 Abnormal        +---------+------------------+-----+--------+-------+  +-------+-----------+-----------+------------+------------+ ABI/TBIToday's ABIToday's TBIPrevious ABIPrevious TBI +-------+-----------+-----------+------------+------------+ Right  0.74       0.49       0.66        0.53         +-------+-----------+-----------+------------+------------+ Left   1.03       0.61       0.89        0.67         +-------+-----------+-----------+------------+------------+   Summary: Right: Resting right ankle-brachial index indicates moderate right lower extremity arterial disease. The right toe-brachial index is abnormal.  Left: Resting left ankle-brachial index is within normal range. No evidence of significant left lower extremity arterial disease. The left toe-brachial index is abnormal.   Right LE Arterial Duplex (08/30/17): Stenosis in the right SFA and and popliteal artery.  No blood flow noted in the right distal SFA and proximal to mid right popliteal artery. Stenotic distal right popliteal artery.   Aortogram (07-12-16): Findings: The aortoiliac segments bilaterally are patent and free of disease. He has bilateral above-knee popliteal artery occlusions which reconstitute at the knee. On the right side he has a diseased runoff of the anterior tibial artery with occluded tibial peroneal trunk and runoff via collaterals to the PT and peroneal arteries. On the left side he reconstitutes his below-knee popliteal and has dominant runoff via the posterior tibial artery. Following intervention there is direct in-line flow via the anterior tibial on the right to the level of the foot.     Assessment and Plan: [name] is a 68 y.o. male who is s/protational atherectomy right AT,rotational atherectomy right popliteal artery, anddrug coated balloon angioplasty of right popliteal artery with 31mm impact by Dr. Donzetta Matters on 07-12-16 by for right lower extremity life-limiting claudication.  There was a significant decline in right LE ABIat his visit on 08-17-17 compared to the previous ABI on 11-17-16, left remainednormal.   ABI's on 02-13-19: significant improvement in bilateral LE arterial perfusion; moderate disease on the right, normal in the left. All biphasic waveforms.   His right LE claudication symptoms resolvedfor a few months since the above procedure, then returnedabout 6 months after the surgery, to his recollection. At his August 2019  visit, right calf started to claudicate (mild) after walking about 1/4 mile. He stated that this did not slow him down, "I forget about it",and continuedwalking.  At that visit he had no signs of ischemia in his feet/legs. At his August 2019 visit, no claudication sx's in left leg. Bilateral femoral pulses werepalpable.  At his visit on 08-30-17, I discussed with Dr. Oneida Alar pt HPI, physical exam results,  and result of right LE arterial duplex.  At his 03-01-18 visit I offered pt conservative approach to continue graduated walking program and return in 6 months with ABI's if his claudication sx's are not life limiting for him, or schedule an arteriogram to address the right SFA occlusion and right popliteal artery stenosis of 75-99%.  He chose the conservative approach, and was to notify us if his right calf symptoms become life limiting.Pletal added at that visit.   His atherosclerotic risk factors include CAD, family history of PAD, and obesity. Fortunately he does not have DM and never used tobacco.   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   PLAN:  Continue Pletal 100 mg po, one tab po bid.  Continue statin and daily 325 mg ASA.  Based on the patient's vascular studies and examination, pt will return to clinic in29monthswith ABI's. I advised him to notify us if he develops concerns re worsening circulation in his feet or legs.   Walk at least 30 minutes daily in a safe environment.  Senior water aerobics also advised if available to him.  I left a message on his cell phone voicemail the assessment and treatment plan with the patient. The patient was advised to call me if he had questions.    The patient was advised to call back or seek an in-person evaluation if the symptoms worsen or if the condition fails to improve as anticipated.  I spent 5 minutes leaving a message on his  voicemail.    Gabrielle Dare Thecla For[facility] Office: [phone]  02/13/2019, 1:08 PM

## 2019-02-24 DIAGNOSIS — M25561 Pain in right knee: Secondary | ICD-10-CM | POA: Diagnosis not present

## 2019-02-24 DIAGNOSIS — M545 Low back pain: Secondary | ICD-10-CM | POA: Diagnosis not present

## 2019-02-24 DIAGNOSIS — M25562 Pain in left knee: Secondary | ICD-10-CM | POA: Diagnosis not present

## 2019-02-24 DIAGNOSIS — M25511 Pain in right shoulder: Secondary | ICD-10-CM | POA: Diagnosis not present

## 2019-02-25 ENCOUNTER — Telehealth: Payer: Self-pay

## 2019-02-25 NOTE — Telephone Encounter (Signed)
   [facility] Pre-operative Risk Assessment    Request for surgical clearance:  1. What type of surgery is being performed? Right Knee Scope   2. When is this surgery scheduled? TBD   3. What type of clearance is required (medical clearance vs. Pharmacy clearance to hold med vs. Both)? Medical  4. Are there any medications that need to be held prior to surgery and how long? No   5. Practice name and name of physician performing surgery?  [facility] Dr. Edmonia Lynch   6. What is your office phone number [phone]    7.   What is your office fax number [phone]  8.   Anesthesia type (None, local, MAC, general) ? None listed   [name] 02/25/2019, 3:38 PM  _________________________________________________________________   (provider comments below)

## 2019-02-26 NOTE — Telephone Encounter (Signed)
   Primary Conway, MD  Chart reviewed as part of pre-operative protocol coverage. Because of Lofton Leon Salas's past medical history and time since last visit, he will require a follow-up visit in order to better assess preoperative cardiovascular risk. He was last seen 02/2018 so it is coming up on nearly a year - the plan was to f/u echo in 1 year (03/2019) and appointment. Do not see echo yet scheduled, and appt with Dr. Burt Knack appears pushed out to October.  Pre-op covering staff: - Please schedule appointment and call patient to inform them. - Please contact requesting surgeon's office via preferred method (i.e, phone, fax) to inform them of need for appointment prior to surgery.  Charlie Pitter, PA-C  02/26/2019, 8:22 AM

## 2019-02-26 NOTE — Telephone Encounter (Signed)
Scheduled the patient for echo 03/26/2019. Schedule the patient for 1 year visit with Dr. Burt Knack 8/31. He was grateful for assistance.

## 2019-02-26 NOTE — Telephone Encounter (Signed)
Call placed to pt re: Surgical Clearance and the need for an appt before clearance can be determined. Pt advised that he has decided to go for a 2nd opinion, and once he does that, he will call and get an appt.  I will go ahead and route to Lenice Llamas, Dr. Antionette Char RN, to arrange for pt's Echocardiogram that is coming due so that can be completed by his appointment.  Will route to requesting surgeon's office to make them aware as well.

## 2019-03-04 ENCOUNTER — Encounter: Payer: Self-pay | Admitting: Orthopaedic Surgery

## 2019-03-04 ENCOUNTER — Ambulatory Visit (INDEPENDENT_AMBULATORY_CARE_PROVIDER_SITE_OTHER): Payer: BLUE CROSS/BLUE SHIELD | Admitting: Orthopaedic Surgery

## 2019-03-04 ENCOUNTER — Telehealth: Payer: Self-pay

## 2019-03-04 VITALS — Ht 72.0 in | Wt 216.0 lb

## 2019-03-04 DIAGNOSIS — S83231A Complex tear of medial meniscus, current injury, right knee, initial encounter: Secondary | ICD-10-CM

## 2019-03-04 MED ORDER — METHYLPREDNISOLONE ACETATE 40 MG/ML IJ SUSP
40.0000 mg | INTRAMUSCULAR | Status: AC | PRN
  Administered 2019-03-04: 20:00:00 40 mg via INTRA_ARTICULAR

## 2019-03-04 MED ORDER — BUPIVACAINE HCL 0.5 % IJ SOLN
2.0000 mL | INTRAMUSCULAR | Status: AC | PRN
  Administered 2019-03-04: 20:00:00 2 mL via INTRA_ARTICULAR

## 2019-03-04 MED ORDER — LIDOCAINE HCL 1 % IJ SOLN
2.0000 mL | INTRAMUSCULAR | Status: AC | PRN
  Administered 2019-03-04: 2 mL

## 2019-03-04 NOTE — Telephone Encounter (Signed)
   Primary Spicer, MD  Chart reviewed as part of pre-operative protocol coverage. Because of [name]'s past medical history and time since last visit, he/she will require a follow-up visit in order to better assess preoperative cardiovascular risk.  Pt has appt 03/31/19 with Dr. Burt Knack, please note on his schedule it is also pre-op clearance.    Pre-op covering staff: -  - Please contact requesting surgeon's office via preferred method (i.e, phone, fax) to inform them of need for appointment prior to surgery.  If applicable, this message will also be routed to pharmacy pool and/or primary cardiologist for input on holding anticoagulant/antiplatelet agent as requested below so that this information is available at time of patient's appointment.   Cecilie Kicks, NP  03/04/2019, 10:05 AM

## 2019-03-04 NOTE — Telephone Encounter (Signed)
   [facility] Pre-operative Risk Assessment    Request for surgical clearance:  1. What type of surgery is being performed? Right Knee Scope   2. When is this surgery scheduled? TBD   3. What type of clearance is required (medical clearance vs. Pharmacy clearance to hold med vs. Both)? MEDICAL  4. Are there any medications that need to be held prior to surgery and how long?   5. Practice name and name of physician performing surgery? [facility] Dr Percell Miller  6. What is your office phone number [phone]    7.   What is your office fax number  [phone]  8.   Anesthesia type (None, local, MAC, general) ? choice   Frederik Schmidt 03/04/2019, 9:47 AM  _________________________________________________________________   (provider comments below)

## 2019-03-04 NOTE — Telephone Encounter (Signed)
Pt has an appt to see Dr. Burt Knack 03/31/2019.  Clearance will be addressed at that time. Pt has been made aware. Will fax the requesting surgeon's office to make them aware.

## 2019-03-04 NOTE — Progress Notes (Signed)
Office Visit Note   Patient: [name]           Date of Birth: 12-05-1950           MRN: [id number] Visit Date: 03/04/2019              Requested by: Caren Macadam, MD [address],  [place] [id number] PCP: Caren Macadam, MD   Assessment & Plan: Visit Diagnoses:  1. Complex tear of medial meniscus of right knee as current injury, initial encounter     Plan: I reviewed the MRI of his left knee which does show a tear of the medial meniscus.  There is slight extrusion of the medial meniscus as well as some focal areas of full-thickness chondromalacia of the medial compartment.  There is a small focal area of edema of the medial tibial plateau likely reactive to the meniscus tear.  I think it is worth trying to treat this conservatively with cortisone shot and rest for the next month and then easing back into normal activities.  Patient is agreeable to the plan.  He will get in touch with [place] if his pain returns.    Follow-Up Instructions: Return if symptoms worsen or fail to improve.   Orders:  No orders of the defined types were placed in this encounter.  No orders of the defined types were placed in this encounter.     Procedures: Large Joint Inj: R knee on 03/04/2019 8:17 PM Indications: pain Details: 22 G needle  Arthrogram: No  Medications: 40 mg methylPREDNISolone acetate 40 MG/ML; 2 mL lidocaine 1 %; 2 mL bupivacaine 0.5 % Consent was given by the patient. Patient was prepped and draped in the usual sterile fashion.       Clinical Data: No additional findings.   Subjective: Chief Complaint  Patient presents with  . Right Knee - Pain    [name] is a very pleasant 68 year old gentleman who comes in for second opinion for right knee pain.  He is already had an MRI which confirmed this.  He saw another orthopedic surgeon in town who advised arthroscopic partial medial meniscectomy.  He states that he does have weakness and giving way and he does  have significant pain with ambulation weightbearing.  He did previously try a prednisone Dosepak and it did give him really good relief.  The pain starts on the medial side of the knee and can run up the thigh.  He has normal range of motion currently.   Review of Systems  Constitutional: Negative.   All other systems reviewed and are negative.    Objective: Vital Signs: Ht 6' (1.829 m)   Wt 216 lb (98 kg)   BMI 29.29 kg/m   Physical Exam Vitals signs and nursing note reviewed.  Constitutional:      Appearance: He is well-developed.  HENT:     Head: Normocephalic and atraumatic.  Eyes:     Pupils: Pupils are equal, round, and reactive to light.  Neck:     Musculoskeletal: Neck supple.  Pulmonary:     Effort: Pulmonary effort is normal.  Abdominal:     Palpations: Abdomen is soft.  Musculoskeletal: Normal range of motion.  Skin:    General: Skin is warm.  Neurological:     Mental Status: He is alert and oriented to person, place, and time.  Psychiatric:        Behavior: Behavior normal.        Thought Content: Thought  content normal.        Judgment: Judgment normal.     Ortho Exam Left knee exam shows trace joint effusion with medial joint line tenderness.  Collaterals and cruciates are stable.  Normal range of motion. Specialty Comments:  No specialty comments available.  Imaging: No results found.   PMFS History: Patient Active Problem List   Diagnosis Date Noted  . Complex tear of medial meniscus of right knee as current injury 03/04/2019  . Chronic diastolic heart failure ([place]) 11/25/2018  . Bruxism (teeth grinding) 11/25/2018  . Cephalalgia 11/25/2018  . Cerebrovascular disease 11/25/2018  . Morning headache 11/25/2018  . Sleep related headaches 09/26/2018  . Hypertension   . Chest pain, somewhat atypical 12/28/2013  . Dyspnea on exertion 12/27/2011  . Dizziness 08/15/2011  . Heart valve replaced by other means 07/28/2011  . Aortic stenosis,  severe 07/18/2011  . Coronary artery disease 07/06/2011  . Peripheral vascular disease ([place]) 07/06/2011  . Hyperlipidemia 07/06/2011   Past Medical History:  Diagnosis Date  . Aortic stenosis    a. severe bicuspid AV stenosis s/p pericardial tissue valve 2012.  . Arthritis   . Coronary artery disease    a. s/p CABG (LIMA-LCx) at time of AVR 2012, mild nonobstructive LAD and RCA stenoses.  . Depression   . Femoral-popliteal bypass graft occlusion, right [facility]) 1997   Dr. Kellie Simmering  . GERD (gastroesophageal reflux disease)   . H/O migraine    up to his 43s  . Headache(784.0)   . Hematuria    negative workup  . Hyperlipidemia   . Hypertension   . 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  [name] PONV (postoperative nausea and vomiting)   . PVD (peripheral vascular disease) ([facility])     Family History  Problem Relation Age of Onset  . Cancer Father   . Cancer Sister   . Healthy Brother   . Heart disease Maternal Grandmother   . Healthy Maternal Grandfather   . Migraines Neg Hx   . Headache Neg Hx     Past Surgical History:  Procedure Laterality Date  . AORTIC VALVE REPLACEMENT  07/18/2011   Procedure: AORTIC VALVE REPLACEMENT (AVR);  Surgeon: Grace Isaac, MD;  Location: [place];  Service: Open Heart Surgery;  Laterality: N/A;  . CARDIAC CATHETERIZATION  2009, 07/11/11   Dr. Irish Lack  . CORONARY ARTERY BYPASS GRAFT  07/18/2011   Procedure: CORONARY ARTERY BYPASS GRAFTING (CABG);  Surgeon: Grace Isaac, MD;  Location: [place];  Service: Open Heart Surgery;  Laterality: N/A;  times one to mammary artery, left  . fem stent Right 2017  . FEMORAL ARTERY - POPLITEAL ARTERY BYPASS GRAFT    . KNEE ARTHROSCOPY     left  . MRI     to visualize aortic valve  . PERIPHERAL VASCULAR CATHETERIZATION N/A 07/12/2016   Procedure: Abdominal Aortogram w/Lower Extremity;  Surgeon: Waynetta Sandy, MD;  Location: [place] [facility];  Service:  Cardiovascular;  Laterality: N/A;  . PERIPHERAL VASCULAR CATHETERIZATION Right 07/12/2016   Procedure: Peripheral Vascular Atherectomy;  Surgeon: Waynetta Sandy, MD;  Location: [place] [facility];  Service: Cardiovascular;  Laterality: Right;  Anterior Tibial and Popliteal  . ROTATOR CUFF REPAIR     Left  . US ECHOCARDIOGRAPHY     Social History   Occupational History  . Occupation: Horticulturist, commercial: Research scientist (life sciences)   Tobacco Use  . Smoking  status: Never Smoker  . Smokeless tobacco: Never Used  Substance and Sexual Activity  . Alcohol use: No  . Drug use: No  . Sexual activity: Not on file

## 2019-03-26 ENCOUNTER — Telehealth: Payer: Self-pay

## 2019-03-26 ENCOUNTER — Other Ambulatory Visit: Payer: Self-pay

## 2019-03-26 ENCOUNTER — Ambulatory Visit (HOSPITAL_COMMUNITY): Payer: BLUE CROSS/BLUE SHIELD | Attending: Cardiovascular Disease

## 2019-03-26 DIAGNOSIS — I359 Nonrheumatic aortic valve disorder, unspecified: Secondary | ICD-10-CM | POA: Insufficient documentation

## 2019-03-26 DIAGNOSIS — L814 Other melanin hyperpigmentation: Secondary | ICD-10-CM | POA: Diagnosis not present

## 2019-03-26 DIAGNOSIS — L821 Other seborrheic keratosis: Secondary | ICD-10-CM | POA: Diagnosis not present

## 2019-03-26 DIAGNOSIS — Z85828 Personal history of other malignant neoplasm of skin: Secondary | ICD-10-CM | POA: Diagnosis not present

## 2019-03-26 DIAGNOSIS — L738 Other specified follicular disorders: Secondary | ICD-10-CM | POA: Diagnosis not present

## 2019-03-26 DIAGNOSIS — L570 Actinic keratosis: Secondary | ICD-10-CM | POA: Diagnosis not present

## 2019-03-26 DIAGNOSIS — L820 Inflamed seborrheic keratosis: Secondary | ICD-10-CM | POA: Diagnosis not present

## 2019-03-26 DIAGNOSIS — D485 Neoplasm of uncertain behavior of skin: Secondary | ICD-10-CM | POA: Diagnosis not present

## 2019-03-26 NOTE — Telephone Encounter (Signed)
NP Ward Givens isn't Currently seeing pts at 7:30 am. Attempted to reachout to r/s pt. No answer. Left a voicemail.

## 2019-03-27 ENCOUNTER — Encounter: Payer: Self-pay | Admitting: Adult Health

## 2019-03-27 ENCOUNTER — Ambulatory Visit (INDEPENDENT_AMBULATORY_CARE_PROVIDER_SITE_OTHER): Payer: BLUE CROSS/BLUE SHIELD | Admitting: Adult Health

## 2019-03-27 VITALS — BP 123/77 | HR 77 | Temp 96.2°F | Ht 72.0 in | Wt 222.4 lb

## 2019-03-27 DIAGNOSIS — I779 Disorder of arteries and arterioles, unspecified: Secondary | ICD-10-CM

## 2019-03-27 DIAGNOSIS — G4733 Obstructive sleep apnea (adult) (pediatric): Secondary | ICD-10-CM

## 2019-03-27 DIAGNOSIS — G4489 Other headache syndrome: Secondary | ICD-10-CM | POA: Diagnosis not present

## 2019-03-27 DIAGNOSIS — Z9989 Dependence on other enabling machines and devices: Secondary | ICD-10-CM | POA: Diagnosis not present

## 2019-03-27 NOTE — Progress Notes (Signed)
PATIENT: [name] DOB: 1951/05/01  REASON FOR VISIT: follow up HISTORY FROM: patient  HISTORY OF PRESENT ILLNESS: Today 03/27/19:  Mr. Bozzi is a 68 year old male with a history of migraine headaches.  He saw Dr. Brett Fairy for a sleep evaluation.  He had a sleep study that revealed severe sleep apnea with an AHI of 52.7 an hour.  He was started on CPAP therapy.  His download indicates that he used his machine for the last 30 days for compliance of 100%.  He uses machine greater than 4 hours 29 out of 30 days for compliance of 97%.  On average he uses his machine 7 hours and 40 minutes.  His residual AHI is 4.8 on 5 to 18 cm of water with EPR 3.  His leak in the 95th percentile is 24 L/min.  Patient reports that CPAP is working well for him.  He states that he has noticed a change in his daytime sleepiness and tiredness.  He reports that his headaches have improved slightly.  He still has approximately a headache every 3 days.  He describes it as a sinus headache right in the center of his forehead.  He returns today for evaluation.  HISTORY FINNIAN ALBANEZ is a 68 y.o. male , seen here as in a referral  from Dr. Jaynee Eagles o 11-25-2018.  Chief complaint according to patient : long standing history of migraine and sleep HA in AM.  He has not being told he snores or has apnea, but suspeced there could be low oxygen at night due to other symptoms.   Sleep and medical history; Active list positive for aortic stenosis occasional dizziness, artificial heart valve, peripheral vascular disease, intractable headaches with some migrainous character.  The diagnostic code was chosen as vascular migraines or vascular headache.  History of severe bicuspid aortic valve stenosis and pericardial tissue valve was placed in 2012.  The patient has a history of rheumatic arthritis, coronary artery disease mild and nonobstructive.  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  Hyperlipidemia, hematuria, hypertension, and near syncope.  He also has a history of peripheral vascular disease.      In January 2020 and his creatinine level rose to 1.05, he had a CT of the head in 2017 without intracranial abnormalities, he is followed by a [facility] and by Dr. Sherren Mocha in cardiology.  His current chronic headaches have been present since 2012.  He reports that the headaches are causing him to have right retro-orbital pain a pressure sensation they have not bothered him continually but sometimes he wakes up in the morning with an additional level of pain.  He denies being woken up by the headache.he reports waking up sleep talking infrequently, but experiences vivid dreams. Not sure he snores, but frequent nocturia.    Family sleep history: no OSA known. Healthy children, parents were never tested, he does not believe there was OSA.    Social history: adult children, 86 and 59 year old twins, and youngest is 55.  4 grandchildren.    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  No ETOh, no tobacco use. Caffeine ; 2 coffees in AM,  frequent sodas  in daytime - 3-4 glasses of 16 ounces.  Eating take out a lot, not a cook.    Sleep habits are as follows: Mr. Aadit Hornsby responded that he does not have a regular dinner time he often picks up food or brings take out home.  His bedtime can be as early as 930 and as late as 11 and averages probably 10:30 PM.  His bedroom is described as cool, quiet and dark and he sleeps alone.  He uses a fan which keeps him cooler but also provides background white noise.  He prefers to sleep on his side but sometimes ends up  sleeping supine, using 3 pillows to prop up his head and often a body pillow to help.  He has nocturia frequently 3-5 times at night and is often awake after that for a long time before he is able to return to sleep.  So while he does not have trouble initiating sleep he cannot sleep through the night.  He sleeps only about 2 hours on block and then experiences fragmented sleep by nocturia.  He is on diuretics.  He rises in the morning at about 645.  But he does not use a TV in his bedroom he does look at his smart phone when he comes back from the bathroom and this may influence the length of his wakefulness at night.  REVIEW OF SYSTEMS: Out of a complete 14 system review of symptoms, the patient complains only of the following symptoms, and all other reviewed systems are negative.  See HPI  ALLERGIES: Allergies  Allergen Reactions   Clopidogrel Itching   Codeine Other (See Comments)    Blood pressure drops   Flexeril [Cyclobenzaprine]     sedation   Morphine And Related Itching   Septra [Sulfamethoxazole-Trimethoprim]     Dizziness, flushed    HOME MEDICATIONS: Outpatient Medications Prior to Visit  Medication Sig Dispense Refill   Adalimumab (HUMIRA PEN) 40 MG/0.8ML PNKT Inject 40 mg into the skin every 14 (fourteen) days.     amoxicillin (AMOXIL) 500 MG capsule TAKE 4 CAPSULES ONE HOUR BEFORE DENTAL APPOINTMENT. 8 capsule 0   aspirin 81 MG tablet Take 1 tablet (81 mg total) by mouth daily.     atorvastatin (LIPITOR) 80 MG tablet TAKE ONE TABLET AT BEDTIME. 30 tablet 6   cilostazol (PLETAL) 100 MG tablet Take 1 tablet (100 mg total) by mouth 2 (two) times daily before a meal. 60 tablet 11   citalopram (CELEXA) 40 MG tablet Take 40 mg by mouth daily at 12 noon.      fluticasone (FLONASE) 50 MCG/ACT nasal spray Place 1-2 sprays into both nostrils daily as needed for allergies or rhinitis.     furosemide (LASIX) 20 MG tablet Take 1 tablet (20 mg total) by mouth daily. 30  tablet 11   ibuprofen (ADVIL,MOTRIN) 200 MG tablet Take 600 mg by mouth every 6 (six) hours as needed for headache or moderate pain.     lansoprazole (PREVACID) 15 MG capsule Take 15 mg by mouth daily as needed. For acid reflux     metoprolol tartrate (LOPRESSOR) 50 MG tablet TAKE (1/2) TABLET TWICE DAILY. 30 tablet 11   pantoprazole (PROTONIX) 40 MG tablet as needed.      Riboflavin (VITAMIN B-2 PO) Take 400 mg by mouth daily.     sodium chloride (OCEAN) 0.65 % SOLN nasal spray Place 1 spray into both nostrils as needed for congestion.     tamsulosin (FLOMAX) 0.4 MG  CAPS capsule Take 0.4 mg by mouth daily.      No facility-administered medications prior to visit.     PAST MEDICAL HISTORY: Past Medical History:  Diagnosis Date   Aortic stenosis    a. severe bicuspid AV stenosis s/p pericardial tissue valve 2012.   Arthritis    Coronary artery disease    a. s/p CABG (LIMA-LCx) at time of AVR 2012, mild nonobstructive LAD and RCA stenoses.   Depression    Femoral-popliteal bypass graft occlusion, right [facility]) 1997   Dr. 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   PONV (postoperative nausea and vomiting)    PVD (peripheral vascular disease) ([place])     PAST SURGICAL HISTORY: Past Surgical History:  Procedure Laterality Date   AORTIC VALVE REPLACEMENT  07/18/2011   Procedure: AORTIC VALVE REPLACEMENT (AVR);  Surgeon: Grace Isaac, MD;  Location: [place];  Service: Open Heart Surgery;  Laterality: N/A;   CARDIAC CATHETERIZATION  2009, 07/11/11   Dr. Irish Lack   CORONARY ARTERY BYPASS GRAFT  07/18/2011   Procedure: CORONARY ARTERY BYPASS GRAFTING (CABG);  Surgeon: Grace Isaac, MD;  Location: [place];  Service: Open Heart Surgery;  Laterality: N/A;  times one  to mammary artery, left   fem stent Right 2017   FEMORAL ARTERY - POPLITEAL ARTERY BYPASS GRAFT     KNEE ARTHROSCOPY     left   MRI     to visualize aortic valve   PERIPHERAL VASCULAR CATHETERIZATION N/A 07/12/2016   Procedure: Abdominal Aortogram w/Lower Extremity;  Surgeon: Waynetta Sandy, MD;  Location: [place] [facility];  Service: Cardiovascular;  Laterality: N/A;   PERIPHERAL VASCULAR CATHETERIZATION Right 07/12/2016   Procedure: Peripheral Vascular Atherectomy;  Surgeon: Waynetta Sandy, MD;  Location: [place] [facility];  Service: Cardiovascular;  Laterality: Right;  Anterior Tibial and Popliteal   ROTATOR CUFF REPAIR     Left   US ECHOCARDIOGRAPHY      FAMILY HISTORY: Family History  Problem Relation Age of Onset   Cancer Father    Cancer Sister    Healthy Brother    Heart disease Maternal Grandmother    Healthy Maternal Grandfather    Migraines Neg Hx    Headache Neg Hx     SOCIAL HISTORY: Social History   Socioeconomic History   Marital status: Divorced    Spouse name: Not on file   Number of children: 4   Years of education: Not on file   Highest education level: Bachelor's degree (e.g., BA, AB, BS)  Occupational History   Occupation: Horticulturist, commercial: Paramedic strain: Not on file   Food insecurity    Worry: Not on file    Inability: Not on file   Transportation needs    Medical: Not on file    Non-medical: Not on file  Tobacco Use   Smoking status: Never Smoker   Smokeless tobacco: Never Used  Substance and Sexual Activity   Alcohol use: No   Drug use: No   Sexual activity: Not on file  Lifestyle   Physical activity    Days per week: Not on file  Minutes per session: Not on file   Stress: Not on file  Relationships   Social connections    Talks on phone: Not on file    Gets together: Not on file    Attends religious service: Not on file     Active member of club or organization: Not on file    Attends meetings of clubs or organizations: Not on file    Relationship status: Not on file   Intimate partner violence    Fear of current or ex partner: Not on file    Emotionally abused: Not on file    Physically abused: Not on file    Forced sexual activity: Not on file  Other Topics Concern   Not on file  Social History Narrative   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   Left handed   Caffeine: about 2 cups daily      PHYSICAL EXAM  Vitals:   03/27/19 0809  BP: 123/77  Pulse: 77  Temp: (!) 96.2 F (35.7 C)  TempSrc: Oral  Weight: 222 lb 6.4 oz (100.9 kg)  Height: 6' (1.829 m)   Body mass index is 30.16 kg/m.  Generalized: Well developed, in no acute distress  Chest: Lungs clear to auscultation bilaterally  Neurological examination  Mentation: Alert oriented to time, place, history taking. Follows all commands speech and language fluent Cranial nerve II-XII: Pupils were equal round reactive to light. Extraocular movements were full, visual field were full on confrontational test.  Head turning and shoulder shrug  were normal and symmetric. Motor: The motor testing reveals 5 over 5 strength of all 4 extremities. Good symmetric motor tone is noted throughout.  Sensory: Sensory testing is intact to soft touch on all 4 extremities. No evidence of extinction is noted.  Coordination: Cerebellar testing reveals good finger-nose-finger and heel-to-shin bilaterally.  Gait and station: Gait is normal.  DIAGNOSTIC DATA (LABS, IMAGING, TESTING) - I reviewed patient records, labs, notes, testing and imaging myself where available.  Lab Results  Component Value Date   WBC 7.3 04/14/2016   HGB 12.6 (L) 07/12/2016   HCT 37.0 (L) 07/12/2016   MCV 83.1 04/14/2016   PLT 290 04/14/2016      Component Value Date/Time   NA 139 12/15/2016 0857   K 4.3 12/15/2016 0857   CL 99 12/15/2016 0857   CO2 23 12/15/2016 0857    GLUCOSE 191 (H) 12/15/2016 0857   GLUCOSE 112 (H) 07/12/2016 0717   BUN 16 12/15/2016 0857   CREATININE 1.15 12/15/2016 0857   CREATININE 1.20 04/14/2016 1029   CALCIUM 9.1 12/15/2016 0857   PROT 6.6 05/12/2016 0854   ALBUMIN 3.9 05/12/2016 0854   AST 40 (H) 05/12/2016 0854   ALT 38 05/12/2016 0854   ALKPHOS 55 05/12/2016 0854   BILITOT 0.5 05/12/2016 0854   GFRNONAA 67 12/15/2016 0857   GFRAA 78 12/15/2016 0857   Lab Results  Component Value Date   CHOL 122 03/21/2018   HDL 42 03/21/2018   LDLCALC 52 03/21/2018   TRIG 138 03/21/2018   CHOLHDL 2.9 03/21/2018   Lab Results  Component Value Date   HGBA1C 5.8 (H) 07/14/2011   No results found for: VITAMINB12     ASSESSMENT AND PLAN 68 y.o. year old male  has a past medical history of Aortic stenosis, Arthritis, Coronary artery disease, Depression, Femoral-popliteal bypass graft occlusion, right ([place]) (1997), GERD (gastroesophageal reflux disease), H/O migraine, Headache(784.0), Hematuria, Hyperlipidemia, Hypertension, Near syncope, PONV (postoperative nausea and  vomiting), and PVD (peripheral vascular disease) ([place]). here with:  1.  Obstructive sleep apnea on CPAP 2. headaches  The patient's CPAP download shows excellent compliance and good treatment of his apnea.  He is encouraged to continue using CPAP nightly and greater than 4 hours each night.  He is advised that if his symptoms worsen or he develops new symptoms he should let us know.  He will follow-up in 6 months or sooner if needed.     Ward Givens, MSN, NP-C 03/27/2019, 7:59 AM [facility] [address] [place], [place] [address] [phone]

## 2019-03-27 NOTE — Patient Instructions (Addendum)
Continue using CPAP nightly and greater than 4 hours each night Monitor headaches If your symptoms worsen or you develop new symptoms please let us know.

## 2019-03-28 ENCOUNTER — Other Ambulatory Visit (HOSPITAL_COMMUNITY): Payer: BLUE CROSS/BLUE SHIELD

## 2019-03-31 ENCOUNTER — Other Ambulatory Visit: Payer: Self-pay

## 2019-03-31 ENCOUNTER — Ambulatory Visit (INDEPENDENT_AMBULATORY_CARE_PROVIDER_SITE_OTHER): Payer: BLUE CROSS/BLUE SHIELD | Admitting: Cardiovascular Disease

## 2019-03-31 ENCOUNTER — Encounter: Payer: Self-pay | Admitting: Cardiovascular Disease

## 2019-03-31 VITALS — BP 122/84 | HR 81 | Ht 72.0 in | Wt 225.6 lb

## 2019-03-31 DIAGNOSIS — I10 Essential (primary) hypertension: Secondary | ICD-10-CM

## 2019-03-31 DIAGNOSIS — E782 Mixed hyperlipidemia: Secondary | ICD-10-CM | POA: Diagnosis not present

## 2019-03-31 DIAGNOSIS — I2510 Atherosclerotic heart disease of native coronary artery without angina pectoris: Secondary | ICD-10-CM | POA: Diagnosis not present

## 2019-03-31 DIAGNOSIS — I739 Peripheral vascular disease, unspecified: Secondary | ICD-10-CM

## 2019-03-31 DIAGNOSIS — I359 Nonrheumatic aortic valve disorder, unspecified: Secondary | ICD-10-CM

## 2019-03-31 NOTE — Progress Notes (Signed)
Cardiology Office Note:    Date:  03/31/2019   ID:  [name], DOB [birth year], MRN ID:[id number]  PCP:  Caren Macadam, MD  Cardiologist:  Sherren Mocha, MD  Electrophysiologist:  None   Referring MD: Caren Macadam, MD   Chief Complaint  Patient presents with  . Shortness of Breath   History of Present Illness:    [name] is a 68 y.o. male with a hx of severe bicuspid aortic valve stenosis status post pericardial tissue valve replacement in 2012.  The patient was also of coronary artery disease and he underwent single-vessel CABG with a LIMA to left complex graft.  The patient has a history of intermittent claudication undergone femoropopliteal bypass and endovascular treatments.  He is followed by vascular surgery.  The patient returns for follow-up evaluation today he is doing well with no significant change in his cardiopulmonary symptoms.  He has mild exertional dyspnea, but no recent episodes of resting shortness of breath, orthopnea, or PND.  He has had no chest pain or pressure.  He denies leg swelling.  Past Medical History:  Diagnosis Date  . Aortic stenosis    a. severe bicuspid AV stenosis s/p pericardial tissue valve 2012.  . Arthritis   . Coronary artery disease    a. s/p CABG (LIMA-LCx) at time of AVR 2012, mild nonobstructive LAD and RCA stenoses.  . Depression   . Depression   . ED (erectile dysfunction)   . Fatty liver   . Femoral-popliteal bypass graft occlusion, right [facility]) 1997   Dr. Kellie Simmering  . GERD (gastroesophageal reflux disease)   . Gout   . H/O migraine    up to his 52s  . Headache(784.0)   . Hematuria    negative workup  . History of colonic polyps   . Hyperlipemia, mixed   . Hyperlipidemia   . Hypertension   . Migraines   . 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  . Obesity   . PAD (peripheral artery disease) ([place])   . PONV (postoperative nausea and vomiting)   . PVD (peripheral vascular  disease) ([place])     Past Surgical History:  Procedure Laterality Date  . AORTIC VALVE REPLACEMENT  07/18/2011   Procedure: AORTIC VALVE REPLACEMENT (AVR);  Surgeon: Grace Isaac, MD;  Location: [place];  Service: Open Heart Surgery;  Laterality: N/A;  . CARDIAC CATHETERIZATION  2009, 07/11/11   Dr. Irish Lack  . CORONARY ARTERY BYPASS GRAFT  07/18/2011   Procedure: CORONARY ARTERY BYPASS GRAFTING (CABG);  Surgeon: Grace Isaac, MD;  Location: [place];  Service: Open Heart Surgery;  Laterality: N/A;  times one to mammary artery, left  . fem stent Right 2017  . FEMORAL ARTERY - POPLITEAL ARTERY BYPASS GRAFT    . KNEE ARTHROSCOPY     left  . MRI     to visualize aortic valve  . PERIPHERAL VASCULAR CATHETERIZATION N/A 07/12/2016   Procedure: Abdominal Aortogram w/Lower Extremity;  Surgeon: Waynetta Sandy, MD;  Location: [place] CV LAB;  Service: Cardiovascular;  Laterality: N/A;  . PERIPHERAL VASCULAR CATHETERIZATION Right 07/12/2016   Procedure: Peripheral Vascular Atherectomy;  Surgeon: Waynetta Sandy, MD;  Location: [place] CV LAB;  Service: Cardiovascular;  Laterality: Right;  Anterior Tibial and Popliteal  . ROTATOR CUFF REPAIR     Left  . US ECHOCARDIOGRAPHY      Current Medications: Current Meds  Medication Sig  . Adalimumab (HUMIRA  PEN) 40 MG/0.8ML PNKT Inject 40 mg into the skin every 14 (fourteen) days.  [name] amoxicillin (AMOXIL) 500 MG capsule TAKE 4 CAPSULES ONE HOUR BEFORE DENTAL APPOINTMENT.  [name] aspirin 81 MG tablet Take 1 tablet (81 mg total) by mouth daily.  [name] atorvastatin (LIPITOR) 80 MG tablet TAKE ONE TABLET AT BEDTIME.  . cilostazol (PLETAL) 100 MG tablet Take 1 tablet (100 mg total) by mouth 2 (two) times daily before a meal.  . citalopram (CELEXA) 40 MG tablet Take 40 mg by mouth daily at 12 noon.   . fluticasone (FLONASE) 50 MCG/ACT nasal spray Place 1-2 sprays into both nostrils daily as needed for allergies or rhinitis.  . furosemide  (LASIX) 20 MG tablet Take 1 tablet (20 mg total) by mouth daily.  [name] ibuprofen (ADVIL,MOTRIN) 200 MG tablet Take 600 mg by mouth every 6 (six) hours as needed for headache or moderate pain.  [name] lansoprazole (PREVACID) 15 MG capsule Take 15 mg by mouth daily as needed. For acid reflux  . metoprolol tartrate (LOPRESSOR) 50 MG tablet TAKE (1/2) TABLET TWICE DAILY.  . pantoprazole (PROTONIX) 40 MG tablet as needed.   . Riboflavin (VITAMIN B-2 PO) Take 400 mg by mouth daily.  . sodium chloride (OCEAN) 0.65 % SOLN nasal spray Place 1 spray into both nostrils as needed for congestion.  . tadalafil (CIALIS) 20 MG tablet Take 20 mg by mouth daily as needed for erectile dysfunction.  . tamsulosin (FLOMAX) 0.4 MG CAPS capsule Take 0.4 mg by mouth daily.      Allergies:   Clopidogrel, Codeine, Flexeril [cyclobenzaprine], Morphine and related, and Septra [sulfamethoxazole-trimethoprim]   Social History   Socioeconomic History  . Marital status: Divorced    Spouse name: Not on file  . Number of children: 4  . Years of education: Not on file  . Highest education level: Bachelor's degree (e.g., BA, AB, BS)  Occupational History  . Occupation: Horticulturist, commercial: [employer]  . Financial resource strain: Not on file  . Food insecurity    Worry: Not on file    Inability: Not on file  . Transportation needs    Medical: Not on file    Non-medical: Not on file  Tobacco Use  . Smoking status: Never Smoker  . Smokeless tobacco: Never Used  Substance and Sexual Activity  . Alcohol use: No  . Drug use: No  . Sexual activity: Not on file  Lifestyle  . Physical activity    Days per week: Not on file    Minutes per session: Not on file  . Stress: Not on file  Relationships  . Social Herbalist on phone: Not on file    Gets together: Not on file    Attends religious service: Not on file    Active member of club or organization: Not on file    Attends meetings of  clubs or organizations: Not on file    Relationship status: Not on file  Other Topics Concern  . Not on file  Social History Narrative   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   Left handed   Caffeine: about 2 cups daily     Family History: The patient's family history includes Cancer in his father, maternal grandfather, and sister; Diabetes in his father; Healthy in his brother and maternal grandfather; Heart disease in his maternal grandmother; Hypertension in his father. There is no history of Migraines or Headache.  ROS:   Please see the history of present illness.    All other systems reviewed and are negative.  EKGs/Labs/Other Studies Reviewed:    The following studies were reviewed today: Echo 03-26-2019: IMPRESSIONS    1. The left ventricle has low normal systolic function, with an ejection fraction of 50-55%. The cavity size was normal. Left ventricular diastolic Doppler parameters are consistent with pseudonormalization.  2. The right ventricle has normal systolic function. The cavity was normal. There is no increase in right ventricular wall thickness.  3. Left atrial size was mildly dilated.  4. Right atrial size was mildly dilated.  5. No evidence of mitral valve stenosis.  6. There is mild AI that appears to be from a perivalvular leak.  7. The aorta is abnormal unless otherwise noted.  8. There is mild dilatation of the ascending aorta and of the aortic root.  9. The atrial septum is grossly normal.  FINDINGS  Left Ventricle: The left ventricle has low normal systolic function, with an ejection fraction of 50-55%. The cavity size was normal. There is no increase in left ventricular wall thickness. Left ventricular diastolic Doppler parameters are consistent  with pseudonormalization.  Right Ventricle: The right ventricle has normal systolic function. The cavity was normal. There is no increase in right ventricular wall thickness.  Left Atrium: Left atrial size  was mildly dilated.  Right Atrium: Right atrial size was mildly dilated. Right atrial pressure is estimated at 10 mmHg.  Interatrial Septum: The atrial septum is grossly normal.  Pericardium: There is no evidence of pericardial effusion.  Mitral Valve: The mitral valve is normal in structure. Mitral valve regurgitation is not visualized by color flow Doppler. No evidence of mitral valve stenosis.  Tricuspid Valve: The tricuspid valve is normal in structure. Tricuspid valve regurgitation was not visualized by color flow Doppler.  Aortic Valve: The aortic valve has been repaired/replaced Aortic valve regurgitation is mild by color flow Doppler. There is mild AI that appears to be from a perivalvular leak.  Pulmonic Valve: The pulmonic valve was grossly normal. Pulmonic valve regurgitation is not visualized by color flow Doppler.  Aorta: The aorta is abnormal unless otherwise noted. There is mild dilatation of the ascending aorta and of the aortic root.    +--------------+-------++ LEFT VENTRICLE        +----------------+---------++ +--------------+-------++ Diastology                PLAX 2D               +----------------+---------++ +--------------+-------++ LV e' lateral:  9.14 cm/s LVIDd:        3.70 cm +----------------+---------++ +--------------+-------++ LV E/e' lateral:8.1       LVIDs:        3.00 cm +----------------+---------++ +--------------+-------++ LV e' medial:   5.44 cm/s LV PW:        1.10 cm +----------------+---------++ +--------------+-------++ LV E/e' medial: 13.6      LV IVS:       1.60 cm +----------------+---------++ +--------------+-------++ LV SV:        23 ml   +--------------+-------++ LV SV Index:  10.27   +--------------+-------++                       +--------------+-------++  +---------------+---------++ RIGHT VENTRICLE          +---------------+---------++ RV Basal diam:  2.57 cm   +---------------+---------++ RV S prime:    9.57 cm/s +---------------+---------++ TAPSE (M-mode):1.9 cm    +---------------+---------++  RVSP:          20.0 mmHg +---------------+---------++  +---------------+-------++-----------++ LEFT ATRIUM           Index       +---------------+-------++-----------++ LA diam:       4.20 cm1.91 cm/m  +---------------+-------++-----------++ LA Vol (A2C):  66.5 ml30.21 ml/m +---------------+-------++-----------++ LA Vol (A4C):  60.7 ml27.57 ml/m +---------------+-------++-----------++ LA Biplane Vol:64.7 ml29.39 ml/m +---------------+-------++-----------++ +------------+---------++-----------++ RIGHT ATRIUM         Index       +------------+---------++-----------++ RA Pressure:3.00 mmHg            +------------+---------++-----------++ RA Area:    18.90 cm            +------------+---------++-----------++ RA Volume:  45.80 ml 20.80 ml/m +------------+---------++-----------++  +------------------+------------++ AORTIC VALVE                   +------------------+------------++ AV Vmax:          196.00 cm/s  +------------------+------------++ AV Vmean:         132.500 cm/s +------------------+------------++ AV VTI:           0.424 m      +------------------+------------++ AV Peak Grad:     15.4 mmHg    +------------------+------------++ AV Mean Grad:     8.0 mmHg     +------------------+------------++ LVOT Vmax:        86.80 cm/s   +------------------+------------++ LVOT Vmean:       57.700 cm/s  +------------------+------------++ LVOT VTI:         0.203 m      +------------------+------------++ LVOT/AV VTI ratio:0.48         +------------------+------------++ AR PHT:           708 msec     +------------------+------------++  +--------------+--------++   +---------------+-----------++ MITRAL VALVE              TRICUSPID VALVE            +--------------+--------++   +---------------+-----------++ MV Area (PHT):           TR Peak grad:  17.0 mmHg   +--------------+--------++   +---------------+-----------++ MV PHT:                  TR Vmax:       206.00 cm/s +--------------+--------++   +---------------+-----------++ MV Decel Time:236 msec   Estimated RAP: 3.00 mmHg   +--------------+--------++   +---------------+-----------++ +--------------+----------++ RVSP:          20.0 mmHg   MV E velocity:74.10 cm/s +---------------+-----------++ +--------------+----------++ MV A velocity:63.80 cm/s +-------------+------+ +--------------+----------++ SHUNTS              MV E/A ratio: 1.16       +-------------+------+ +--------------+----------++ Systemic VTI:0.20 m                              +-------------+------+   EKG:  EKG is ordered today.  The ekg ordered today demonstrates normal sinus rhythm 81 bpm, possible intermittent anterior infarct, nonspecific ST and T wave abnormality.  Recent Labs: No results found for requested labs within last 8760 hours.  Recent Lipid Panel    Component Value Date/Time   CHOL 122 03/21/2018 0755   TRIG 138 03/21/2018 0755   HDL 42 03/21/2018 0755   CHOLHDL 2.9 03/21/2018 0755   CHOLHDL 4.5 05/12/2016 0854   VLDL 28 05/12/2016 0854   LDLCALC 52 03/21/2018 0755    Physical Exam:  VS:  BP 122/84   Pulse 81   Ht 6' (1.829 m)   Wt 225 lb 9.6 oz (102.3 kg)   SpO2 95%   BMI 30.60 kg/m     Wt Readings from Last 3 Encounters:  03/31/19 225 lb 9.6 oz (102.3 kg)  03/27/19 222 lb 6.4 oz (100.9 kg)  03/04/19 216 lb (98 kg)     GEN:  Well nourished, well developed in no acute distress HEENT: Normal NECK: No JVD; No carotid bruits LYMPHATICS: No lymphadenopathy CARDIAC: RRR, 2/6 systolic murmur sternal border RESPIRATORY:  Clear to auscultation without rales, wheezing or rhonchi  ABDOMEN: Soft,  non-tender, non-distended MUSCULOSKELETAL: Trace bilateral ankle edema; No deformity  SKIN: Warm and dry NEUROLOGIC:  Alert and oriented x 3 PSYCHIATRIC:  Normal affect   ASSESSMENT:    1. Mixed hyperlipidemia   2. Aortic valve disorder   3. Coronary artery disease involving native coronary artery of native heart without angina pectoris   4. Peripheral vascular disease ([place])   5. Essential hypertension    PLAN:    In order of problems listed above:  1. I personally reviewed the patient's echo images which demonstrate normal function of his bioprosthetic aortic valve with the exception of mild aortic insufficiency.  The aortic insufficiency appears to be paravalvular.  The patient's symptoms of New York Heart Association functional class II breath are unchanged.  His LV is nondilated and LVEF in the low normal range.  Continued observation is indicated with a repeat echocardiogram in 1 year.  He is counseled about progressive symptoms and he especially will watch out for any worsening shortness of breath in the interim.  He follows SBE prophylaxis per guidelines. 2. The patient is having no chest pain.  He is tolerating antiplatelet therapy with aspirin. 3. Treated with atorvastatin 80 mg daily.  LDL cholesterol goal is less than 70/dL with known vascular disease and cardiac disease last lipids show a cholesterol 114, HDL 38, LDL 50 4. No significant limitation at present.  He is treated with Pletal and secondary risk reduction measures with aspirin and high intensity statin drug.  Followed by vascular surgery. 5. Blood pressure is controlled on current therapy   Medication Adjustments/Labs and Tests Ordered: Current medicines are reviewed at length with the patient today.  Concerns regarding medicines are outlined above.  Orders Placed This Encounter  Procedures  . EKG 12-Lead  . ECHOCARDIOGRAM COMPLETE   No orders of the defined types were placed in this encounter.   Patient  Instructions  Medication Instructions:  Your provider recommends that you continue on your current medications as directed. Please refer to the Current Medication list given to you today.    Labwork: None  Testing/Procedures: Your provider has requested that you have an echocardiogram in 1 year. Echocardiography is a painless test that uses sound waves to create images of your heart. It provides your doctor with information about the size and shape of your heart and how well your heart's chambers and valves are working. This procedure takes approximately one hour. There are no restrictions for this procedure.  Follow-Up: You will be called to arrange your 1 year echo and office visit with Dr. Burt Knack when the schedule is available.    Signed, Sherren Mocha, MD  03/31/2019 3:58 PM    [place]

## 2019-03-31 NOTE — Patient Instructions (Signed)
Medication Instructions:  Your provider recommends that you continue on your current medications as directed. Please refer to the Current Medication list given to you today.    Labwork: None  Testing/Procedures: Your provider has requested that you have an echocardiogram in 1 year. Echocardiography is a painless test that uses sound waves to create images of your heart. It provides your doctor with information about the size and shape of your heart and how well your heart's chambers and valves are working. This procedure takes approximately one hour. There are no restrictions for this procedure.  Follow-Up: You will be called to arrange your 1 year echo and office visit with Dr. Burt Knack when the schedule is available.

## 2019-04-01 ENCOUNTER — Telehealth: Payer: Self-pay | Admitting: Adult Health

## 2019-04-01 NOTE — Telephone Encounter (Signed)
Pt has called back for RN Lovey Newcomer, please call

## 2019-04-01 NOTE — Telephone Encounter (Signed)
Pt called stating that he was suggested something for the headaches he's been having and he would like something called in for him to the [facility]. Please advise.

## 2019-04-01 NOTE — Telephone Encounter (Signed)
Ask if he has tried OTC sinus medicine for his headaches. If so but no benefit from headaches we could try low dose topamax.

## 2019-04-01 NOTE — Telephone Encounter (Signed)
LMVM for pt that returning call concerning headaches.

## 2019-04-01 NOTE — Telephone Encounter (Signed)
LMVM for pt to return call.   

## 2019-04-01 NOTE — Telephone Encounter (Signed)
I called and VM picked up, did not LM this time.

## 2019-04-01 NOTE — Telephone Encounter (Signed)
I called pt and he is still c/o of sinus like headaches (on awakening) today L eye level 4-5, when sneezes or coughs forehead hurts.  Takes motrin, tylenol helps very slight but they come back.  Did discuss when last seen.  What med did you have in mind?

## 2019-04-01 NOTE — Telephone Encounter (Signed)
Pt returned call, please call back when available.

## 2019-04-03 ENCOUNTER — Other Ambulatory Visit: Payer: Self-pay | Admitting: *Deleted

## 2019-04-03 ENCOUNTER — Other Ambulatory Visit: Payer: Self-pay | Admitting: Cardiovascular Disease

## 2019-04-03 NOTE — Telephone Encounter (Signed)
Schedule him for a follow up with myself or Amy and we will discuss treating the headache. I'm glad he is on cpap, he had severe OSA and glad we caught it as this could have caused a stroke or heart attack . But if he is still having headaches we can now address those as possibly a separate issue that was exacerbated by the sleep apnea but stillneeds to be treated. thanks

## 2019-04-03 NOTE — Telephone Encounter (Signed)
Pt has called back asking that RN Lovey Newcomer f/u's with NP Jinny Blossom because he is still walking around with the headache.  Pt asked that his regular PCP is not in favor of him using OTC medications.  Please call

## 2019-04-03 NOTE — Telephone Encounter (Signed)
Pt returned call. Please call back when available. 

## 2019-04-03 NOTE — Telephone Encounter (Signed)
Pt called back.  He is a little frustrated.  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  We have played a lot of phone tag.  He has been using his cpap, and has tried sinus otc meds and still having headaches.  Would like to get some relief over the weekend.  He played phone tag with MM/NP and she had left for the day. I relayed that can send to her she may check her messages, but also to Dr. Jaynee Eagles as he first saw her initially for headaches.

## 2019-04-03 NOTE — Telephone Encounter (Signed)
I called patient LVM

## 2019-04-03 NOTE — Telephone Encounter (Signed)
I called patient and LVM.

## 2019-04-08 MED ORDER — TOPIRAMATE 25 MG PO TABS: ORAL_TABLET | ORAL | 5 refills | Status: DC

## 2019-04-08 NOTE — Addendum Note (Signed)
Addended by: Trudie Buckler on: 04/08/2019 11:18 AM   Modules accepted: Orders

## 2019-04-08 NOTE — Telephone Encounter (Signed)
I called and LMVM for pt relating to Time of speaking with MM/NP also  topamax with him. we can start on low dose 25 mg at bedtime for 2 weeks then increase to 50 mg at bedtime. make sure he has no history of kidney stones. side effects can include numbness/tingling in fingertips, toes and around mouth. usually will resolve. higher doses can affect cognition usually alertness. most people tolerate it just fine.  I did LV this long message.  Hopefully when calls back MM/NP will be able to speak with him .

## 2019-04-08 NOTE — Telephone Encounter (Signed)
I spoke to the patient this morning.  He is amenable to starting Topamax.  We will begin with 25 mg at bedtime for 2 weeks and then increase to 50 mg thereafter.  I have reviewed side effects with the patient.  He voiced understanding.  He is advised that if he has any trouble with Topamax or finds that is not beneficial for his headaches he should let us know.

## 2019-04-11 DIAGNOSIS — L821 Other seborrheic keratosis: Secondary | ICD-10-CM | POA: Diagnosis not present

## 2019-04-11 DIAGNOSIS — L570 Actinic keratosis: Secondary | ICD-10-CM | POA: Diagnosis not present

## 2019-04-18 ENCOUNTER — Ambulatory Visit: Payer: BLUE CROSS/BLUE SHIELD | Admitting: Cardiovascular Disease

## 2019-05-01 ENCOUNTER — Other Ambulatory Visit: Payer: Self-pay | Admitting: Cardiovascular Disease

## 2019-05-01 ENCOUNTER — Ambulatory Visit: Payer: Self-pay

## 2019-05-01 ENCOUNTER — Telehealth: Payer: Self-pay

## 2019-05-01 ENCOUNTER — Encounter: Payer: Self-pay | Admitting: Orthopaedic Surgery

## 2019-05-01 ENCOUNTER — Ambulatory Visit (INDEPENDENT_AMBULATORY_CARE_PROVIDER_SITE_OTHER): Payer: BC Managed Care – PPO | Admitting: Orthopaedic Surgery

## 2019-05-01 DIAGNOSIS — G8929 Other chronic pain: Secondary | ICD-10-CM

## 2019-05-01 DIAGNOSIS — M25512 Pain in left shoulder: Secondary | ICD-10-CM

## 2019-05-01 NOTE — Telephone Encounter (Signed)
   Primary Cardiologist: Sherren Mocha, MD  Chart reviewed as part of pre-operative protocol coverage. Patient was contacted 05/01/2019 in reference to pre-operative risk assessment for pending surgery as outlined below.   Pt states he is not having knee surgery at this time.   I will route this recommendation to the requesting party via Epic fax function and remove from pre-op pool.  Please call with questions.  [place], PA 05/01/2019, 11:41 AM

## 2019-05-01 NOTE — Telephone Encounter (Signed)
   [facility] Pre-operative Risk Assessment    Request for surgical clearance:  1. What type of surgery is being performed? Right Knee Scope   2. When is this surgery scheduled? TBD   3. What type of clearance is required (medical clearance vs. Pharmacy clearance to hold med vs. Both)? Medical  4. Are there any medications that need to be held prior to surgery and how long? No   5. Practice name and name of physician performing surgery? [facility], Dr. Edmonia Lynch   6. What is your office phone number [phone] ext [phone]([place])    7.   What is your office fax number [phone])  8.   Anesthesia type (None, local, MAC, general) ? None listed   [name] 05/01/2019, 10:52 AM  _________________________________________________________________   (provider comments below)

## 2019-05-01 NOTE — Progress Notes (Signed)
Subjective: Patient is here for ultrasound-guided intra-articular left glenohumeral injection.  Partial RCT.  Objective:  Good ROM but pain at extremes.  Procedure: Ultrasound-guided left glenohumeral injection: After sterile prep with Betadine, injected 8 cc 1% lidocaine without epinephrine and 40 mg methylprednisolone using a 22-gauge spinal needle, passing the needle through approach into the glenohumeral joint.  Injectate seen filling joint capsule.  Good immediate relief.

## 2019-05-01 NOTE — Progress Notes (Signed)
Office Visit Note   Patient: [name]           Date of Birth: 14-Jul-1951           MRN: [id number] Visit Date: 05/01/2019              Requested by: Caren Macadam, MD [place],  [place] [address] PCP: Caren Macadam, MD   Assessment & Plan: Visit Diagnoses:  1. Chronic left shoulder pain     Plan: Impression is left shoulder rotator cuff arthropathy.  We will refer the patient to Dr. Junius Roads for an ultrasound-guided cortisone injection of left shoulder.  We will also start him in physical therapy to work on strength.  If he fails to get relief with conservative treatment we may entertain referring him to Dr. Marlou Sa for discussion of a reverse total shoulder replacement but he is in agreement with [place] that this would be a last resort.[name]  He will otherwise follow-up with [place] as needed.  Follow-Up Instructions: Return if symptoms worsen or fail to improve.   Orders:  Orders Placed This Encounter  Procedures  . XR Shoulder Left   No orders of the defined types were placed in this encounter.     Procedures: No procedures performed   Clinical Data: No additional findings.   Subjective: Chief Complaint  Patient presents with  . 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  This has been ongoing for the past year after colliding with a football player.  The pain he has is to the entire shoulder itself and radiates into the deltoid.  He describes this as a constant ache worse with forward flexion, sleeping on his left side or holding anything substantial at the level of the shoulder as this causes significant weakness.  He has been taking Tylenol with mild relief of symptoms.  He denies any radicular symptoms.  He does note a history of a left shoulder rotator cuff repair 5 to 10 years ago.  Doing well until this new event.  He was seen at the beginning of the year at  [facility] where he says he underwent cortisone injection.  This did seem to help until recently.  He also notes that at some point earlier in the year he had an MRI which he thinks showed a re-tear of the rotator cuff.  Review of Systems as detailed in HPI.  All others reviewed and are negative.   Objective: Vital Signs: There were no vitals taken for this visit.  Physical Exam well-developed and well-nourished gentleman in no acute distress.  Alert and oriented x3.  Ortho Exam examination of his left shoulder reveals full active range of motion.  No tenderness over the [facility] joint.  Positive empty can.  Negative loss body adduction.  He does have 3 out of 5 strength with resisted external rotation.  Negative drop arm.  He is neurovascularly intact distally.  Specialty Comments:  No specialty comments available.  Imaging: Xr Shoulder Left  Result Date: 05/01/2019 X-rays of the left shoulder reveal decreased joint space to the [facility] and glenohumeral joints.  He does have slight superior migration of the humeral head.    PMFS History: Patient Active Problem List   Diagnosis Date Noted  . Complex tear of medial meniscus of right knee as current injury 03/04/2019  . Chronic diastolic heart failure ([place]) 11/25/2018  .  Bruxism (teeth grinding) 11/25/2018  . Cephalalgia 11/25/2018  . Cerebrovascular disease 11/25/2018  . Morning headache 11/25/2018  . Sleep related headaches 09/26/2018  . Hypertension   . Chest pain, somewhat atypical 12/28/2013  . Dyspnea on exertion 12/27/2011  . Dizziness 08/15/2011  . Heart valve replaced by other means 07/28/2011  . Aortic stenosis, severe 07/18/2011  . Coronary artery disease 07/06/2011  . Peripheral vascular disease ([place]) 07/06/2011  . Hyperlipidemia 07/06/2011   Past Medical History:  Diagnosis Date  . Aortic stenosis    a. severe bicuspid AV stenosis s/p pericardial tissue valve 2012.  . Arthritis   . Coronary artery disease    a. s/p  CABG (LIMA-LCx) at time of AVR 2012, mild nonobstructive LAD and RCA stenoses.  . Depression   . Depression   . ED (erectile dysfunction)   . Fatty liver   . Femoral-popliteal bypass graft occlusion, right [facility]) 1997   Dr. Kellie Simmering  . GERD (gastroesophageal reflux disease)   . Gout   . H/O migraine    up to his 64s  . Headache(784.0)   . Hematuria    negative workup  . History of colonic polyps   . Hyperlipemia, mixed   . Hyperlipidemia   . Hypertension   . Migraines   . 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  . Obesity   . PAD (peripheral artery disease) ([place])   . PONV (postoperative nausea and vomiting)   . PVD (peripheral vascular disease) ([facility])     Family History  Problem Relation Age of Onset  . Cancer Father   . Hypertension Father   . Diabetes Father   . Cancer Sister   . Healthy Brother   . Heart disease Maternal Grandmother   . Healthy Maternal Grandfather   . Cancer Maternal Grandfather   . Migraines Neg Hx   . Headache Neg Hx     Past Surgical History:  Procedure Laterality Date  . AORTIC VALVE REPLACEMENT  07/18/2011   Procedure: AORTIC VALVE REPLACEMENT (AVR);  Surgeon: Grace Isaac, MD;  Location: [place];  Service: Open Heart Surgery;  Laterality: N/A;  . CARDIAC CATHETERIZATION  2009, 07/11/11   Dr. Irish Lack  . CORONARY ARTERY BYPASS GRAFT  07/18/2011   Procedure: CORONARY ARTERY BYPASS GRAFTING (CABG);  Surgeon: Grace Isaac, MD;  Location: [place];  Service: Open Heart Surgery;  Laterality: N/A;  times one to mammary artery, left  . fem stent Right 2017  . FEMORAL ARTERY - POPLITEAL ARTERY BYPASS GRAFT    . KNEE ARTHROSCOPY     left  . MRI     to visualize aortic valve  . PERIPHERAL VASCULAR CATHETERIZATION N/A 07/12/2016   Procedure: Abdominal Aortogram w/Lower Extremity;  Surgeon: Waynetta Sandy, MD;  Location: [place] [facility];  Service: Cardiovascular;  Laterality: N/A;  . PERIPHERAL VASCULAR  CATHETERIZATION Right 07/12/2016   Procedure: Peripheral Vascular Atherectomy;  Surgeon: Waynetta Sandy, MD;  Location: [place] [facility];  Service: Cardiovascular;  Laterality: Right;  Anterior Tibial and Popliteal  . ROTATOR CUFF REPAIR     Left  . US ECHOCARDIOGRAPHY     Social History   Occupational History  . Occupation: Horticulturist, commercial: Research scientist (life sciences)   Tobacco Use  . Smoking status: Never Smoker  . Smokeless tobacco: Never Used  Substance and Sexual Activity  . Alcohol use: No  . Drug use: No  . Sexual  activity: Not on file

## 2019-05-13 DIAGNOSIS — L4050 Arthropathic psoriasis, unspecified: Secondary | ICD-10-CM | POA: Diagnosis not present

## 2019-05-13 DIAGNOSIS — M1712 Unilateral primary osteoarthritis, left knee: Secondary | ICD-10-CM | POA: Diagnosis not present

## 2019-05-13 DIAGNOSIS — M1A00X0 Idiopathic chronic gout, unspecified site, without tophus (tophi): Secondary | ICD-10-CM | POA: Diagnosis not present

## 2019-05-13 DIAGNOSIS — Z5181 Encounter for therapeutic drug level monitoring: Secondary | ICD-10-CM | POA: Diagnosis not present

## 2019-05-22 DIAGNOSIS — M25512 Pain in left shoulder: Secondary | ICD-10-CM | POA: Diagnosis not present

## 2019-05-27 DIAGNOSIS — M25512 Pain in left shoulder: Secondary | ICD-10-CM | POA: Diagnosis not present

## 2019-05-29 DIAGNOSIS — M25512 Pain in left shoulder: Secondary | ICD-10-CM | POA: Diagnosis not present

## 2019-06-01 ENCOUNTER — Other Ambulatory Visit: Payer: Self-pay | Admitting: Cardiovascular Disease

## 2019-06-05 DIAGNOSIS — M25512 Pain in left shoulder: Secondary | ICD-10-CM | POA: Diagnosis not present

## 2019-06-12 ENCOUNTER — Ambulatory Visit: Payer: BC Managed Care – PPO | Admitting: Orthopaedic Surgery

## 2019-06-12 ENCOUNTER — Ambulatory Visit (INDEPENDENT_AMBULATORY_CARE_PROVIDER_SITE_OTHER): Payer: BC Managed Care – PPO

## 2019-06-12 ENCOUNTER — Other Ambulatory Visit: Payer: Self-pay

## 2019-06-12 ENCOUNTER — Encounter: Payer: Self-pay | Admitting: Orthopaedic Surgery

## 2019-06-12 VITALS — Ht 72.0 in | Wt 220.0 lb

## 2019-06-12 DIAGNOSIS — M545 Low back pain: Secondary | ICD-10-CM

## 2019-06-12 DIAGNOSIS — G8929 Other chronic pain: Secondary | ICD-10-CM

## 2019-06-12 MED ORDER — PREDNISONE 10 MG (21) PO TBPK: ORAL_TABLET | ORAL | 0 refills | Status: DC

## 2019-06-12 MED ORDER — TRAMADOL HCL 50 MG PO TABS: 50.0000 mg | ORAL_TABLET | Freq: Every day | ORAL | 2 refills | Status: DC | PRN

## 2019-06-12 NOTE — Progress Notes (Signed)
Office Visit Note   Patient: [name]           Date of Birth: [birth year]           MRN: [id number] Visit Date: 06/12/2019              Requested by: Seth Macadam, MD [place],  [place] [address] PCP: Seth Macadam, MD   Assessment & Plan: Visit Diagnoses:  1. Chronic right-sided low back pain, unspecified whether sciatica present     Plan: Impression is facet disease at L5-S1 on the right side and degenerative disc disease.  Prescription for prednisone Dosepak and tramadol as needed for the breakthrough pain.  Patient cannot take NSAIDs due to significant cardiac history.  I have also given him a prescription for outpatient PT.  If he does not notice any improvement over the next month he should call that we will order an MRI of his lumbar spine with the intent of ESI.  Follow-Up Instructions: Return if symptoms worsen or fail to improve.   Orders:  Orders Placed This Encounter  Procedures  . XR Lumbar Spine 2-3 Views   Meds ordered this encounter  Medications  . predniSONE (STERAPRED UNI-PAK 21 TAB) 10 MG (21) TBPK tablet    Sig: Take as directed    Dispense:  21 tablet    Refill:  0  . traMADol (ULTRAM) 50 MG tablet    Sig: Take 1-2 tablets (50-100 mg total) by mouth daily as needed.    Dispense:  30 tablet    Refill:  2      Procedures: No procedures performed   Clinical Data: No additional findings.   Subjective: Chief Complaint  Patient presents with  . Lower Back - Pain    [name] is a 68 year old gentleman seen for couple of other issues who comes in for evaluation of 3 months of low back pain on the right side with radiation to the buttock and some into the thigh.  Denies any constitutional symptoms.  Denies any bowel or bladder incontinence.  Denies any injuries.  He has some stiffness and increased pain when he transitions from sit to stand.   Review of Systems  Constitutional: Negative.   All other systems reviewed  and are negative.    Objective: Vital Signs: Ht 6' (1.829 m)   Wt 220 lb (99.8 kg)   BMI 29.84 kg/m   Physical Exam Vitals signs and nursing note reviewed.  Constitutional:      Appearance: He is well-developed.  Pulmonary:     Effort: Pulmonary effort is normal.  Abdominal:     Palpations: Abdomen is soft.  Skin:    General: Skin is warm.  Neurological:     Mental Status: He is alert and oriented to person, place, and time.  Psychiatric:        Behavior: Behavior normal.        Thought Content: Thought content normal.        Judgment: Judgment normal.     Ortho Exam Bilateral lower extremity exam unremarkable.  No pathologic reflexes. He has tenderness on the right side of the lumbar spine.  Negative sciatic tension signs. Specialty Comments:  No specialty comments available.  Imaging: Xr Lumbar Spine 2-3 Views  Result Date: 06/12/2019 Mild lumbar spondylosis and degenerative disc disease of L5-S1    PMFS History: Patient Active Problem List   Diagnosis Date Noted  . Complex tear of medial meniscus of right  knee as current injury 03/04/2019  . Chronic diastolic heart failure ([place]) 11/25/2018  . Bruxism (teeth grinding) 11/25/2018  . Cephalalgia 11/25/2018  . Cerebrovascular disease 11/25/2018  . Morning headache 11/25/2018  . Sleep related headaches 09/26/2018  . Hypertension   . Chest pain, somewhat atypical 12/28/2013  . Dyspnea on exertion 12/27/2011  . Dizziness 08/15/2011  . Heart valve replaced by other means 07/28/2011  . Aortic stenosis, severe 07/18/2011  . Coronary artery disease 07/06/2011  . Peripheral vascular disease ([place]) 07/06/2011  . Hyperlipidemia 07/06/2011   Past Medical History:  Diagnosis Date  . Aortic stenosis    a. severe bicuspid AV stenosis s/p pericardial tissue valve 2012.  . Arthritis   . Coronary artery disease    a. s/p CABG (LIMA-LCx) at time of AVR 2012, mild nonobstructive LAD and RCA stenoses.  . Depression    . Depression   . ED (erectile dysfunction)   . Fatty liver   . Femoral-popliteal bypass graft occlusion, right [facility]) 1997   Dr. Kellie Simmering  . GERD (gastroesophageal reflux disease)   . Gout   . H/O migraine    up to his 35s  . Headache(784.0)   . Hematuria    negative workup  . History of colonic polyps   . Hyperlipemia, mixed   . Hyperlipidemia   . Hypertension   . Migraines   . 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  . Obesity   . PAD (peripheral artery disease) ([place])   . PONV (postoperative nausea and vomiting)   . PVD (peripheral vascular disease) ([facility])     Family History  Problem Relation Age of Onset  . Cancer Father   . Hypertension Father   . Diabetes Father   . Cancer Sister   . Healthy Brother   . Heart disease Maternal Grandmother   . Healthy Maternal Grandfather   . Cancer Maternal Grandfather   . Migraines Neg Hx   . Headache Neg Hx     Past Surgical History:  Procedure Laterality Date  . AORTIC VALVE REPLACEMENT  07/18/2011   Procedure: AORTIC VALVE REPLACEMENT (AVR);  Surgeon: Grace Isaac, MD;  Location: [place];  Service: Open Heart Surgery;  Laterality: N/A;  . CARDIAC CATHETERIZATION  2009, 07/11/11   Dr. Irish Lack  . CORONARY ARTERY BYPASS GRAFT  07/18/2011   Procedure: CORONARY ARTERY BYPASS GRAFTING (CABG);  Surgeon: Grace Isaac, MD;  Location: [place];  Service: Open Heart Surgery;  Laterality: N/A;  times one to mammary artery, left  . fem stent Right 2017  . FEMORAL ARTERY - POPLITEAL ARTERY BYPASS GRAFT    . KNEE ARTHROSCOPY     left  . MRI     to visualize aortic valve  . PERIPHERAL VASCULAR CATHETERIZATION N/A 07/12/2016   Procedure: Abdominal Aortogram w/Lower Extremity;  Surgeon: Waynetta Sandy, MD;  Location: [place] [facility];  Service: Cardiovascular;  Laterality: N/A;  . PERIPHERAL VASCULAR CATHETERIZATION Right 07/12/2016   Procedure: Peripheral Vascular Atherectomy;  Surgeon:  Waynetta Sandy, MD;  Location: [place] [facility];  Service: Cardiovascular;  Laterality: Right;  Anterior Tibial and Popliteal  . ROTATOR CUFF REPAIR     Left  . US ECHOCARDIOGRAPHY     Social History   Occupational History  . Occupation: Horticulturist, commercial: Research scientist (life sciences)   Tobacco Use  . Smoking status: Never Smoker  . Smokeless tobacco: Never Used  Substance and  Sexual Activity  . Alcohol use: No  . Drug use: No  . Sexual activity: Not on file

## 2019-06-30 ENCOUNTER — Ambulatory Visit: Payer: BLUE CROSS/BLUE SHIELD | Admitting: Cardiovascular Disease

## 2019-06-30 ENCOUNTER — Telehealth: Payer: Self-pay | Admitting: Radiology

## 2019-06-30 ENCOUNTER — Encounter

## 2019-06-30 NOTE — Telephone Encounter (Signed)
Long story short- please call patient to discuss scheduling PT somewhere other that [facility].  Please see below before you call patient, thanks.  Hinton Dyer from [facility] called.  Patient stopped by there to make his appointment for PT.  He knocked on the door, and Hinton Dyer advised him that she could not let him in to schedule the appt, that they would need to do this by phone.  She explained to him that they are limiting who comes in to the clinic due to [place], and only patients with appts can come in.  He told her that [place] is her problem, not his. He got angry at her, so to avoid further confrontation, she told him ok, just come in.  She turned to go to her desk, and he did not follow.  She went back to the door, asked him if he was coming in or not, and he became angrier, stating she turned her back on him.  She tried to explain that she had asked him to come in.  He said he can go to many other PT facilities, and that he would not be scheduling there at [facility].  Hinton Dyer asked that I relay this to you all.  Thanks.

## 2019-06-30 NOTE — Telephone Encounter (Signed)
See message below. Do you want me to send him to [facility] ?

## 2019-06-30 NOTE — Telephone Encounter (Signed)
Yes [facility]

## 2019-07-02 ENCOUNTER — Other Ambulatory Visit: Payer: Self-pay

## 2019-07-02 DIAGNOSIS — M545 Low back pain: Secondary | ICD-10-CM

## 2019-07-02 DIAGNOSIS — G8929 Other chronic pain: Secondary | ICD-10-CM

## 2019-07-02 NOTE — Telephone Encounter (Signed)
Order made. They will contact him to schedule PT. Called to let him know no answer. LMOM to return our call.

## 2019-07-04 ENCOUNTER — Telehealth: Payer: Self-pay | Admitting: Orthopaedic Surgery

## 2019-07-04 NOTE — Telephone Encounter (Signed)
Returned call to patient.  Left message to call back. 

## 2019-07-10 DIAGNOSIS — M5416 Radiculopathy, lumbar region: Secondary | ICD-10-CM | POA: Diagnosis not present

## 2019-07-10 DIAGNOSIS — M6281 Muscle weakness (generalized): Secondary | ICD-10-CM | POA: Diagnosis not present

## 2019-07-10 DIAGNOSIS — M25651 Stiffness of right hip, not elsewhere classified: Secondary | ICD-10-CM | POA: Diagnosis not present

## 2019-07-10 DIAGNOSIS — M545 Low back pain: Secondary | ICD-10-CM | POA: Diagnosis not present

## 2019-07-11 DIAGNOSIS — R197 Diarrhea, unspecified: Secondary | ICD-10-CM | POA: Diagnosis not present

## 2019-08-07 DIAGNOSIS — Z0000 Encounter for general adult medical examination without abnormal findings: Secondary | ICD-10-CM | POA: Diagnosis not present

## 2019-08-07 DIAGNOSIS — R197 Diarrhea, unspecified: Secondary | ICD-10-CM | POA: Diagnosis not present

## 2019-08-12 DIAGNOSIS — Z125 Encounter for screening for malignant neoplasm of prostate: Secondary | ICD-10-CM | POA: Diagnosis not present

## 2019-08-12 DIAGNOSIS — Z23 Encounter for immunization: Secondary | ICD-10-CM | POA: Diagnosis not present

## 2019-08-12 DIAGNOSIS — R197 Diarrhea, unspecified: Secondary | ICD-10-CM | POA: Diagnosis not present

## 2019-08-12 DIAGNOSIS — E782 Mixed hyperlipidemia: Secondary | ICD-10-CM | POA: Diagnosis not present

## 2019-08-12 DIAGNOSIS — Z0000 Encounter for general adult medical examination without abnormal findings: Secondary | ICD-10-CM | POA: Diagnosis not present

## 2019-08-20 DIAGNOSIS — R197 Diarrhea, unspecified: Secondary | ICD-10-CM | POA: Diagnosis not present

## 2019-08-21 DIAGNOSIS — R197 Diarrhea, unspecified: Secondary | ICD-10-CM | POA: Diagnosis not present

## 2019-09-10 ENCOUNTER — Other Ambulatory Visit: Payer: Self-pay | Admitting: Cardiovascular Disease

## 2019-09-10 ENCOUNTER — Telehealth: Payer: Self-pay | Admitting: Orthopaedic Surgery

## 2019-09-10 NOTE — Telephone Encounter (Signed)
Called patient left voicemail message to return call to schedule an appointment per his request  On mychart for Shoulder and knee pain with Dr. Erlinda Hong

## 2019-09-16 ENCOUNTER — Ambulatory Visit: Payer: BC Managed Care – PPO | Admitting: Orthopaedic Surgery

## 2019-09-16 ENCOUNTER — Encounter: Payer: Self-pay | Admitting: Orthopaedic Surgery

## 2019-09-16 ENCOUNTER — Ambulatory Visit: Payer: Self-pay

## 2019-09-16 ENCOUNTER — Other Ambulatory Visit: Payer: Self-pay

## 2019-09-16 VITALS — Ht 72.0 in | Wt 213.0 lb

## 2019-09-16 DIAGNOSIS — M1711 Unilateral primary osteoarthritis, right knee: Secondary | ICD-10-CM

## 2019-09-16 DIAGNOSIS — M75102 Unspecified rotator cuff tear or rupture of left shoulder, not specified as traumatic: Secondary | ICD-10-CM | POA: Diagnosis not present

## 2019-09-16 DIAGNOSIS — M1712 Unilateral primary osteoarthritis, left knee: Secondary | ICD-10-CM | POA: Diagnosis not present

## 2019-09-16 DIAGNOSIS — M12812 Other specific arthropathies, not elsewhere classified, left shoulder: Secondary | ICD-10-CM | POA: Insufficient documentation

## 2019-09-16 MED ORDER — METHYLPREDNISOLONE ACETATE 40 MG/ML IJ SUSP
40.0000 mg | INTRAMUSCULAR | Status: AC | PRN
  Administered 2019-09-16: 40 mg via INTRA_ARTICULAR

## 2019-09-16 MED ORDER — BUPIVACAINE HCL 0.5 % IJ SOLN
2.0000 mL | INTRAMUSCULAR | Status: AC | PRN
  Administered 2019-09-16: 2 mL via INTRA_ARTICULAR

## 2019-09-16 MED ORDER — LIDOCAINE HCL 1 % IJ SOLN
2.0000 mL | INTRAMUSCULAR | Status: AC | PRN
  Administered 2019-09-16: 2 mL

## 2019-09-16 NOTE — Progress Notes (Signed)
Subjective: Patient is here for ultrasound-guided intra-articular left glenohumeral injection.    Objective:  Pain at extremes of ROM.  Procedure: Ultrasound-guided left glenohumeral injection: After sterile prep with Betadine, injected 8 cc 1% lidocaine without epinephrine and 40 mg methylprednisolone using a 22-gauge spinal needle, passing the needle through approach into the glenohumeral joint.  Injectate seen filling joint capsule.

## 2019-09-16 NOTE — Progress Notes (Signed)
Office Visit Note   Patient: [name]           Date of Birth: 1951-03-02           MRN: [id number] Visit Date: 09/16/2019              Requested by: Caren Macadam, MD [place],  [place] [address] PCP: Caren Macadam, MD   Assessment & Plan: Visit Diagnoses:  1. Primary osteoarthritis of left knee   2. Rotator cuff tear arthropathy of left shoulder   3. Primary osteoarthritis of right knee     Plan: Bilateral knee injections repeated today.  Patient tolerated this well.  We sent [name] to Dr. Junius Roads for an injection of the left shoulder today.  Follow-up as needed.  Follow-Up Instructions: Return if symptoms worsen or fail to improve.   Orders:  Orders Placed This Encounter  Procedures  . XR KNEE 3 VIEW LEFT  . US Guided Needle Placement - No Linked Charges   No orders of the defined types were placed in this encounter.     Procedures: Large Joint Inj: bilateral knee on 09/16/2019 9:44 AM Indications: pain Details: 22 G needle  Arthrogram: No  Medications (Right): 2 mL lidocaine 1 %; 2 mL bupivacaine 0.5 %; 40 mg methylPREDNISolone acetate 40 MG/ML Medications (Left): 2 mL lidocaine 1 %; 2 mL bupivacaine 0.5 %; 40 mg methylPREDNISolone acetate 40 MG/ML Outcome: tolerated well, no immediate complications Patient was prepped and draped in the usual sterile fashion.       Clinical Data: No additional findings.   Subjective: Chief Complaint  Patient presents with  . Left Knee - Pain  . Right Knee - Pain  . Left Shoulder - Pain    [name] returns today for follow-up of bilateral knee pain due to osteoarthritis and left shoulder Rotator cuff arthropathy.  He is requesting repeat injections.  No changes in medical history.   Review of Systems   Objective: Vital Signs: Ht 6' (1.829 m)   Wt 213 lb (96.6 kg)   BMI 28.89 kg/m   Physical Exam  Ortho Exam Bilateral knee and left shoulder exams are unchanged. Specialty  Comments:  No specialty comments available.  Imaging: US Guided Needle Placement - No Linked Charges  Result Date: 09/16/2019 Please see Notes tab for imaging impression.  XR KNEE 3 VIEW LEFT  Result Date: 09/16/2019 Moderately severe tricompartmental degenerative joint disease with significant medial compartment joint space narrowing    PMFS History: Patient Active Problem List   Diagnosis Date Noted  . Primary osteoarthritis of left knee 09/16/2019  . Rotator cuff tear arthropathy of left shoulder 09/16/2019  . Primary osteoarthritis of right knee 09/16/2019  . Complex tear of medial meniscus of right knee as current injury 03/04/2019  . Chronic diastolic heart failure ([place]) 11/25/2018  . Bruxism (teeth grinding) 11/25/2018  . Cephalalgia 11/25/2018  . Cerebrovascular disease 11/25/2018  . Morning headache 11/25/2018  . Sleep related headaches 09/26/2018  . Hypertension   . Chest pain, somewhat atypical 12/28/2013  . Dyspnea on exertion 12/27/2011  . Dizziness 08/15/2011  . Heart valve replaced by other means 07/28/2011  . Aortic stenosis, severe 07/18/2011  . Coronary artery disease 07/06/2011  . Peripheral vascular disease ([place]) 07/06/2011  . Hyperlipidemia 07/06/2011   Past Medical History:  Diagnosis Date  . Aortic stenosis    a. severe bicuspid AV stenosis s/p pericardial tissue valve 2012.  . Arthritis   . Coronary  artery disease    a. s/p CABG (LIMA-LCx) at time of AVR 2012, mild nonobstructive LAD and RCA stenoses.  . Depression   . Depression   . ED (erectile dysfunction)   . Fatty liver   . Femoral-popliteal bypass graft occlusion, right [facility]) 1997   Dr. Kellie Simmering  . GERD (gastroesophageal reflux disease)   . Gout   . H/O migraine    up to his 76s  . Headache(784.0)   . Hematuria    negative workup  . History of colonic polyps   . Hyperlipemia, mixed   . Hyperlipidemia   . Hypertension   . Migraines   . 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  . Obesity   . PAD (peripheral artery disease) ([place])   . PONV (postoperative nausea and vomiting)   . PVD (peripheral vascular disease) ([facility])     Family History  Problem Relation Age of Onset  . Cancer Father   . Hypertension Father   . Diabetes Father   . Cancer Sister   . Healthy Brother   . Heart disease Maternal Grandmother   . Healthy Maternal Grandfather   . Cancer Maternal Grandfather   . Migraines Neg Hx   . Headache Neg Hx     Past Surgical History:  Procedure Laterality Date  . AORTIC VALVE REPLACEMENT  07/18/2011   Procedure: AORTIC VALVE REPLACEMENT (AVR);  Surgeon: Grace Isaac, MD;  Location: [place];  Service: Open Heart Surgery;  Laterality: N/A;  . CARDIAC CATHETERIZATION  2009, 07/11/11   Dr. Irish Lack  . CORONARY ARTERY BYPASS GRAFT  07/18/2011   Procedure: CORONARY ARTERY BYPASS GRAFTING (CABG);  Surgeon: Grace Isaac, MD;  Location: [place];  Service: Open Heart Surgery;  Laterality: N/A;  times one to mammary artery, left  . fem stent Right 2017  . FEMORAL ARTERY - POPLITEAL ARTERY BYPASS GRAFT    . KNEE ARTHROSCOPY     left  . MRI     to visualize aortic valve  . PERIPHERAL VASCULAR CATHETERIZATION N/A 07/12/2016   Procedure: Abdominal Aortogram w/Lower Extremity;  Surgeon: Waynetta Sandy, MD;  Location: [place] [facility];  Service: Cardiovascular;  Laterality: N/A;  . PERIPHERAL VASCULAR CATHETERIZATION Right 07/12/2016   Procedure: Peripheral Vascular Atherectomy;  Surgeon: Waynetta Sandy, MD;  Location: [place] [facility];  Service: Cardiovascular;  Laterality: Right;  Anterior Tibial and Popliteal  . ROTATOR CUFF REPAIR     Left  . US ECHOCARDIOGRAPHY     Social History   Occupational History  . Occupation: Horticulturist, commercial: Research scientist (life sciences)   Tobacco Use  . Smoking status: Never Smoker  . Smokeless tobacco: Never Used  Substance and Sexual Activity  . Alcohol use: No  .  Drug use: No  . Sexual activity: Not on file

## 2019-09-25 DIAGNOSIS — L4050 Arthropathic psoriasis, unspecified: Secondary | ICD-10-CM | POA: Diagnosis not present

## 2019-09-25 DIAGNOSIS — L409 Psoriasis, unspecified: Secondary | ICD-10-CM | POA: Diagnosis not present

## 2019-09-25 DIAGNOSIS — Z79899 Other long term (current) drug therapy: Secondary | ICD-10-CM | POA: Diagnosis not present

## 2019-09-28 ENCOUNTER — Ambulatory Visit: Payer: BC Managed Care – PPO | Attending: Internal Medicine

## 2019-09-28 DIAGNOSIS — Z23 Encounter for immunization: Secondary | ICD-10-CM

## 2019-09-28 NOTE — Progress Notes (Signed)
   Covid-19 Vaccination Clinic  Name:  [name]    MRN: [id number] DOB: [birth year]  09/28/2019  [name] was observed post Covid-19 immunization for 15 minutes without incidence. He was provided with Vaccine Information Sheet and instruction to access the V-Safe system.   [name] was instructed to call 911 with any severe reactions post vaccine: [name] Difficulty breathing  . Swelling of your face and throat  . A fast heartbeat  . A bad rash all over your body  . Dizziness and weakness    Immunizations Administered    Name Date Dose VIS Date Route   Pfizer COVID-19 Vaccine 09/28/2019 11:31 AM 0.3 mL 07/11/2019 Intramuscular   Manufacturer: [place]   Lot: [id number]   [place]: [id number]

## 2019-09-29 ENCOUNTER — Other Ambulatory Visit: Payer: Self-pay

## 2019-09-29 ENCOUNTER — Encounter: Payer: Self-pay | Admitting: Adult Health

## 2019-09-29 ENCOUNTER — Ambulatory Visit (INDEPENDENT_AMBULATORY_CARE_PROVIDER_SITE_OTHER): Payer: BC Managed Care – PPO | Admitting: Adult Health

## 2019-09-29 VITALS — BP 122/84 | HR 84 | Temp 96.8°F | Ht 72.0 in | Wt 217.0 lb

## 2019-09-29 DIAGNOSIS — G4733 Obstructive sleep apnea (adult) (pediatric): Secondary | ICD-10-CM

## 2019-09-29 DIAGNOSIS — Z9989 Dependence on other enabling machines and devices: Secondary | ICD-10-CM | POA: Diagnosis not present

## 2019-09-29 DIAGNOSIS — G4489 Other headache syndrome: Secondary | ICD-10-CM | POA: Diagnosis not present

## 2019-09-29 MED ORDER — TOPIRAMATE 25 MG PO TABS: 25.0000 mg | ORAL_TABLET | Freq: Every day | ORAL | 5 refills | Status: DC

## 2019-09-29 NOTE — Progress Notes (Signed)
PATIENT: [name] DOB: 07/18/1951  REASON FOR VISIT: follow up HISTORY FROM: patient  HISTORY OF PRESENT ILLNESS: Today 09/29/19:  [name] is a 69 year old male with a history of obstructive sleep apnea on CPAP.  His download indicates that he uses machine 24 out of 30 days for compliance of 80% he uses machine greater than 4 hours 8 days for compliance of 27%.  On average he uses his machine 3 hours and 27 minutes.  His residual AHI is 7.8 on 5 to 18 cm of water with EPR 3.  Leak in the 95th percentile is 9.5 L/min.  He reports that he continues to have daily headaches.  They typically occur above the left eye.  He states it also feels like he has pressure behind the eye.  He is unsure if this is sinus related.  He denies photophobia and phonophobia.  Denies nausea or vomiting.  States that he either takes tramadol or Tylenol for his headache and sometimes he takes nothing.  He has never seen ENT.  He did try Topamax but only for 2 or 3 days.  States that it made him feel weird.  Returns today for an evaluation.  HISTORY  03/27/19:  [name] is a 69 year old male with a history of migraine headaches.  He saw Dr. Brett Fairy for a sleep evaluation.  He had a sleep study that revealed severe sleep apnea with an AHI of 52.7 an hour.  He was started on CPAP therapy.  His download indicates that he used his machine for the last 30 days for compliance of 100%.  He uses machine greater than 4 hours 29 out of 30 days for compliance of 97%.  On average he uses his machine 7 hours and 40 minutes.  His residual AHI is 4.8 on 5 to 18 cm of water with EPR 3.  His leak in the 95th percentile is 24 L/min.  Patient reports that CPAP is working well for him.  He states that he has noticed a change in his daytime sleepiness and tiredness.  He reports that his headaches have improved slightly.  He still has approximately a headache every 3 days.  He describes it as a sinus headache right in the center of  his forehead.  He returns today for evaluation.  REVIEW OF SYSTEMS: Out of a complete 14 system review of symptoms, the patient complains only of the following symptoms, and all other reviewed systems are negative.  Epworth sleepiness score 2   ALLERGIES: Allergies  Allergen Reactions  . Clopidogrel Itching  . Codeine Other (See Comments)    Blood pressure drops  . Flexeril [Cyclobenzaprine]     sedation  . Morphine And Related Itching  . Septra [Sulfamethoxazole-Trimethoprim]     Dizziness, flushed    HOME MEDICATIONS: Outpatient Medications Prior to Visit  Medication Sig Dispense Refill  . Adalimumab (HUMIRA PEN) 40 MG/0.8ML PNKT Inject 40 mg into the skin every 14 (fourteen) days.    [name] amoxicillin (AMOXIL) 500 MG capsule TAKE 4 CAPSULES ONE HOUR BEFORE DENTAL APPOINTMENT. 8 capsule 0  . aspirin 81 MG tablet Take 1 tablet (81 mg total) by mouth daily.    [name] atorvastatin (LIPITOR) 80 MG tablet Take 1 tablet (80 mg total) by mouth at bedtime. 90 tablet 3  . cilostazol (PLETAL) 100 MG tablet TAKE (1) TABLET TWICE A DAY BEFORE MEALS. 60 tablet 11  . citalopram (CELEXA) 40 MG tablet Take 40 mg by mouth daily at  12 noon.     . fluticasone (FLONASE) 50 MCG/ACT nasal spray Place 1-2 sprays into both nostrils daily as needed for allergies or rhinitis.    . furosemide (LASIX) 20 MG tablet TAKE 1 TABLET ONCE DAILY. 30 tablet 9  . lansoprazole (PREVACID) 15 MG capsule Take 15 mg by mouth daily as needed. For acid reflux    . metoprolol tartrate (LOPRESSOR) 50 MG tablet TAKE (1/2) TABLET TWICE DAILY. 90 tablet 2  . pantoprazole (PROTONIX) 40 MG tablet as needed.     . Riboflavin (VITAMIN B-2 PO) Take 400 mg by mouth daily.    . sodium chloride (OCEAN) 0.65 % SOLN nasal spray Place 1 spray into both nostrils as needed for congestion.    . tadalafil (CIALIS) 20 MG tablet Take 20 mg by mouth daily as needed for erectile dysfunction.    . tamsulosin (FLOMAX) 0.4 MG CAPS capsule Take 0.4 mg by  mouth daily.     [name] topiramate (TOPAMAX) 25 MG tablet Take 1 tablet PO at bedtime for 2 weeks then increase to 3 tabs PO at bedtime thereafter 60 tablet 5  . traMADol (ULTRAM) 50 MG tablet Take 1-2 tablets (50-100 mg total) by mouth daily as needed. 30 tablet 2  . ibuprofen (ADVIL,MOTRIN) 200 MG tablet Take 600 mg by mouth every 6 (six) hours as needed for headache or moderate pain.    . predniSONE (STERAPRED UNI-PAK 21 TAB) 10 MG (21) TBPK tablet Take as directed 21 tablet 0   No facility-administered medications prior to visit.    PAST MEDICAL HISTORY: Past Medical History:  Diagnosis Date  . Aortic stenosis    a. severe bicuspid AV stenosis s/p pericardial tissue valve 2012.  . Arthritis   . Coronary artery disease    a. s/p CABG (LIMA-LCx) at time of AVR 2012, mild nonobstructive LAD and RCA stenoses.  . Depression   . Depression   . ED (erectile dysfunction)   . Fatty liver   . Femoral-popliteal bypass graft occlusion, right [facility]) 1997   Dr. Kellie Simmering  . GERD (gastroesophageal reflux disease)   . Gout   . H/O migraine    up to his 13s  . Headache(784.0)   . Hematuria    negative workup  . History of colonic polyps   . Hyperlipemia, mixed   . Hyperlipidemia   . Hypertension   . Migraines   . 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  . Obesity   . PAD (peripheral artery disease) ([place])   . PONV (postoperative nausea and vomiting)   . PVD (peripheral vascular disease) ([place])     PAST SURGICAL HISTORY: Past Surgical History:  Procedure Laterality Date  . AORTIC VALVE REPLACEMENT  07/18/2011   Procedure: AORTIC VALVE REPLACEMENT (AVR);  Surgeon: Grace Isaac, MD;  Location: [facility];  Service: Open Heart Surgery;  Laterality: N/A;  . CARDIAC CATHETERIZATION  2009, 07/11/11   Dr. Irish Lack  . CORONARY ARTERY BYPASS GRAFT  07/18/2011   Procedure: CORONARY ARTERY BYPASS GRAFTING (CABG);  Surgeon: Grace Isaac, MD;  Location: [facility];   Service: Open Heart Surgery;  Laterality: N/A;  times one to mammary artery, left  . fem stent Right 2017  . FEMORAL ARTERY - POPLITEAL ARTERY BYPASS GRAFT    . KNEE ARTHROSCOPY     left  . MRI     to visualize aortic valve  . PERIPHERAL VASCULAR CATHETERIZATION N/A 07/12/2016   Procedure:  Abdominal Aortogram w/Lower Extremity;  Surgeon: Waynetta Sandy, MD;  Location: [facility];  Service: Cardiovascular;  Laterality: N/A;  . PERIPHERAL VASCULAR CATHETERIZATION Right 07/12/2016   Procedure: Peripheral Vascular Atherectomy;  Surgeon: Waynetta Sandy, MD;  Location: [facility];  Service: Cardiovascular;  Laterality: Right;  Anterior Tibial and Popliteal  . ROTATOR CUFF REPAIR     Left  . US ECHOCARDIOGRAPHY      FAMILY HISTORY: Family History  Problem Relation Age of Onset  . Cancer Father   . Hypertension Father   . Diabetes Father   . Cancer Sister   . Healthy Brother   . Heart disease Maternal Grandmother   . Healthy Maternal Grandfather   . Cancer Maternal Grandfather   . Migraines Neg Hx   . Headache Neg Hx     SOCIAL HISTORY: Social History   Socioeconomic History  . Marital status: Divorced    Spouse name: Not on file  . Number of children: 4  . Years of education: Not on file  . Highest education level: Bachelor's degree (e.g., BA, AB, BS)  Occupational History  . Occupation: Horticulturist, commercial: Research scientist (life sciences)   Tobacco Use  . Smoking status: Never Smoker  . Smokeless tobacco: Never Used  Substance and Sexual Activity  . Alcohol use: No  . Drug use: No  . Sexual activity: Not on file  Other Topics Concern  . Not on file  Social History Narrative   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   Left handed   Caffeine: about 2 cups daily   Social Determinants of Health   Financial Resource Strain:   . Difficulty of Paying Living Expenses: Not on file  Food Insecurity:   . Worried About Charity fundraiser in the Last  Year: Not on file  . Ran Out of Food in the Last Year: Not on file  Transportation Needs:   . Lack of Transportation (Medical): Not on file  . Lack of Transportation (Non-Medical): Not on file  Physical Activity:   . Days of Exercise per Week: Not on file  . Minutes of Exercise per Session: Not on file  Stress:   . Feeling of Stress : Not on file  Social Connections:   . Frequency of Communication with Friends and Family: Not on file  . Frequency of Social Gatherings with Friends and Family: Not on file  . Attends Religious Services: Not on file  . Active Member of Clubs or Organizations: Not on file  . Attends Archivist Meetings: Not on file  . Marital Status: Not on file  Intimate Partner Violence:   . Fear of Current or Ex-Partner: Not on file  . Emotionally Abused: Not on file  . Physically Abused: Not on file  . Sexually Abused: Not on file      PHYSICAL EXAM  Vitals:   09/29/19 0822  BP: 122/84  Pulse: 84  Temp: (!) 96.8 F (36 C)  SpO2: 98%  Weight: 217 lb (98.4 kg)  Height: 6' (1.829 m)   Body mass index is 29.43 kg/m.  Generalized: Well developed, in no acute distress   Neurological examination  Mentation: Alert oriented to time, place, history taking. Follows all commands speech and language fluent Cranial nerve II-XII: Pupils were equal round reactive to light. Extraocular movements were full, visual field were full on confrontational test. Head turning and shoulder shrug  were normal and symmetric. Motor: The  motor testing reveals 5 over 5 strength of all 4 extremities. Good symmetric motor tone is noted throughout.  Sensory: Sensory testing is intact to soft touch on all 4 extremities. No evidence of extinction is noted.  Coordination: Cerebellar testing reveals good finger-nose-finger and heel-to-shin bilaterally.  Gait and station: Gait is normal.    DIAGNOSTIC DATA (LABS, IMAGING, TESTING) - I reviewed patient records, labs, notes,  testing and imaging myself where available.  Lab Results  Component Value Date   WBC 7.3 04/14/2016   HGB 12.6 (L) 07/12/2016   HCT 37.0 (L) 07/12/2016   MCV 83.1 04/14/2016   PLT 290 04/14/2016      Component Value Date/Time   NA 139 12/15/2016 0857   K 4.3 12/15/2016 0857   CL 99 12/15/2016 0857   CO2 23 12/15/2016 0857   GLUCOSE 191 (H) 12/15/2016 0857   GLUCOSE 112 (H) 07/12/2016 0717   BUN 16 12/15/2016 0857   CREATININE 1.15 12/15/2016 0857   CREATININE 1.20 04/14/2016 1029   CALCIUM 9.1 12/15/2016 0857   PROT 6.6 05/12/2016 0854   ALBUMIN 3.9 05/12/2016 0854   AST 40 (H) 05/12/2016 0854   ALT 38 05/12/2016 0854   ALKPHOS 55 05/12/2016 0854   BILITOT 0.5 05/12/2016 0854   GFRNONAA 67 12/15/2016 0857   GFRAA 78 12/15/2016 0857   Lab Results  Component Value Date   CHOL 122 03/21/2018   HDL 42 03/21/2018   LDLCALC 52 03/21/2018   TRIG 138 03/21/2018   CHOLHDL 2.9 03/21/2018   Lab Results  Component Value Date   HGBA1C 5.8 (H) 07/14/2011        ASSESSMENT AND PLAN 69 y.o. year old male  has a past medical history of Aortic stenosis, Arthritis, Coronary artery disease, Depression, Depression, ED (erectile dysfunction), Fatty liver, Femoral-popliteal bypass graft occlusion, right ([place]) (1997), GERD (gastroesophageal reflux disease), Gout, H/O migraine, Headache(784.0), Hematuria, History of colonic polyps, Hyperlipemia, mixed, Hyperlipidemia, Hypertension, Migraines, Near syncope, Obesity, PAD (peripheral artery disease) ([place]), PONV (postoperative nausea and vomiting), and PVD (peripheral vascular disease) ([place]). here with:  1.  Obstructive sleep apnea on CPAP 2.  Headaches  The patient is encouraged to continue using CPAP nightly and greater than 4 hours each night.  Advised that being consistent with his CPAP could improve his headache frequency.  He will retry Topamax 25 mg at bedtime.  If he continues to have significant side effects and is unable to  tolerate the medication he should let us know.  We will follow-up in 3 months or sooner if needed  I spent 25 minutes with the patient.  This time was spent discussing CPAP download and treatment for headaches  Ward Givens, MSN, NP-C 09/29/2019, 8:41 AM [facility] [address], [place], [place] [address] [phone]

## 2019-09-29 NOTE — Patient Instructions (Signed)
Your Plan:  Continue using CPAP nightly and greater than 4 hours each night If your symptoms worsen or you develop new symptoms please let us know.   Start Topamax 25mg  at bedtime  Thank you for coming to see [place] at [facility]. I hope we have been able to provide you high quality care today.  You may receive a patient satisfaction survey over the next few weeks. We would appreciate your feedback and comments so that we may continue to improve ourselves and the health of our patients.  Topiramate tablets What is this medicine? TOPIRAMATE (toe PYRE a mate) is used to treat seizures in adults or children with epilepsy. It is also used for the prevention of migraine headaches. This medicine may be used for other purposes; ask your health care provider or pharmacist if you have questions. COMMON BRAND NAME(S): Topamax, Topiragen What should I tell my health care provider before I take this medicine? They need to know if you have any of these conditions:  bleeding disorders  kidney disease  lung or breathing disease, like asthma  suicidal thoughts, plans, or attempt; a previous suicide attempt by you or a family member  an unusual or allergic reaction to topiramate, other medicines, foods, dyes, or preservatives  pregnant or trying to get pregnant  breast-feeding How should I use this medicine? Take this medicine by mouth with a glass of water. Follow the directions on the prescription label. Do not cut, crush or chew this medicine. Swallow the tablets whole. You can take it with or without food. If it upsets your stomach, take it with food. Take your medicine at regular intervals. Do not take it more often than directed. Do not stop taking except on your doctor's advice. A special MedGuide will be given to you by the pharmacist with each prescription and refill. Be sure to read this information carefully each time. Talk to your pediatrician regarding the use of this  medicine in children. While this drug may be prescribed for children as young as 81 years of age for selected conditions, precautions do apply. Overdosage: If you think you have taken too much of this medicine contact a poison control center or emergency room at once. NOTE: This medicine is only for you. Do not share this medicine with others. What if I miss a dose? If you miss a dose, take it as soon as you can. If your next dose is to be taken in less than 6 hours, then do not take the missed dose. Take the next dose at your regular time. Do not take double or extra doses. What may interact with this medicine? This medicine may interact with the following medications:  acetazolamide  alcohol  antihistamines for allergy, cough, and cold  aspirin and aspirin-like medicines  atropine  birth control pills  certain medicines for anxiety or sleep  certain medicines for bladder problems like oxybutynin, tolterodine  certain medicines for depression like amitriptyline, fluoxetine, sertraline  certain medicines for seizures like carbamazepine, phenobarbital, phenytoin, primidone, valproic acid, zonisamide  certain medicines for stomach problems like dicyclomine, hyoscyamine  certain medicines for travel sickness like scopolamine  certain medicines for Parkinson's disease like benztropine, trihexyphenidyl  certain medicines that treat or prevent blood clots like warfarin, enoxaparin, dalteparin, apixaban, dabigatran, and rivaroxaban  digoxin  general anesthetics like halothane, isoflurane, methoxyflurane, propofol  hydrochlorothiazide  ipratropium  lithium  medicines that relax muscles for surgery  metformin  narcotic medicines for pain  NSAIDs, medicines for  pain and inflammation, like ibuprofen or naproxen  phenothiazines like chlorpromazine, mesoridazine, prochlorperazine, thioridazine  pioglitazone This list may not describe all possible interactions. Give your  health care provider a list of all the medicines, herbs, non-prescription drugs, or dietary supplements you use. Also tell them if you smoke, drink alcohol, or use illegal drugs. Some items may interact with your medicine. What should I watch for while using this medicine? Visit your doctor or health care professional for regular checks on your progress. Tell your health care professional if your symptoms do not start to get better or if they get worse. Do not stop taking except on your health care professional's advice. You may develop a severe reaction. Your health care professional will tell you how much medicine to take. Wear a medical ID bracelet or chain. Carry a card that describes your disease and details of your medicine and dosage times. This medicine can reduce the response of your body to heat or cold. Dress warm in cold weather and stay hydrated in hot weather. If possible, avoid extreme temperatures like saunas, hot tubs, very hot or cold showers, or activities that can cause dehydration such as vigorous exercise. Check with your health care professional if you have severe diarrhea, nausea, and vomiting, or if you sweat a lot. The loss of too much body fluid may make it dangerous for you to take this medicine. You may get drowsy or dizzy. Do not drive, use machinery, or do anything that needs mental alertness until you know how this medicine affects you. Do not stand up or sit up quickly, especially if you are an older patient. This reduces the risk of dizzy or fainting spells. Alcohol may interfere with the effect of this medicine. Avoid alcoholic drinks. Tell your health care professional right away if you have any change in your eyesight. Patients and their families should watch out for new or worsening depression or thoughts of suicide. Also watch out for sudden changes in feelings such as feeling anxious, agitated, panicky, irritable, hostile, aggressive, impulsive, severely restless,  overly excited and hyperactive, or not being able to sleep. If this happens, especially at the beginning of treatment or after a change in dose, call your healthcare professional. This medicine may cause serious skin reactions. They can happen weeks to months after starting the medicine. Contact your health care provider right away if you notice fevers or flu-like symptoms with a rash. The rash may be red or purple and then turn into blisters or peeling of the skin. Or, you might notice a red rash with swelling of the face, lips or lymph nodes in your neck or under your arms. Birth control may not work properly while you are taking this medicine. Talk to your health care professional about using an extra method of birth control. Women should inform their health care professional if they wish to become pregnant or think they might be pregnant. There is a potential for serious side effects and harm to an unborn child. Talk to your health care professional for more information. What side effects may I notice from receiving this medicine? Side effects that you should report to your doctor or health care professional as soon as possible:  allergic reactions like skin rash, itching or hives, swelling of the face, lips, or tongue  blood in the urine  changes in vision  confusion  loss of memory  pain in lower back or side  pain when urinating  redness, blistering, peeling or loosening  of the skin, including inside the mouth  signs and symptoms of bleeding such as bloody or black, tarry stools; red or dark brown urine; spitting up blood or brown material that looks like coffee grounds; red spots on the skin; unusual bruising or bleeding from the eyes, gums, or nose  signs and symptoms of increased acid in the body like breathing fast; fast heartbeat; headache; confusion; unusually weak or tired; nausea, vomiting  suicidal thoughts, mood changes  trouble speaking or understanding  unusual  sweating  unusually weak or tired Side effects that usually do not require medical attention (report to your doctor or health care professional if they continue or are bothersome):  dizziness  drowsiness  fever  loss of appetite  nausea, vomiting  pain, tingling, numbness in the hands or feet  stomach pain  tiredness  upset stomach This list may not describe all possible side effects. Call your doctor for medical advice about side effects. You may report side effects to FDA at 1-800-FDA-1088. Where should I keep my medicine? Keep out of the reach of children. Store at room temperature between 15 and 30 degrees C (59 and 86 degrees F). Throw away any unused medicine after the expiration date. NOTE: This sheet is a summary. It may not cover all possible information. If you have questions about this medicine, talk to your doctor, pharmacist, or health care provider.  2020 Elsevier/Gold Standard (2019-02-13 15:07:20)

## 2019-09-30 ENCOUNTER — Ambulatory Visit: Payer: BC Managed Care – PPO | Admitting: Physician Assistant

## 2019-09-30 ENCOUNTER — Other Ambulatory Visit: Payer: Self-pay

## 2019-09-30 ENCOUNTER — Ambulatory Visit (HOSPITAL_COMMUNITY)
Admission: RE | Admit: 2019-09-30 | Discharge: 2019-09-30 | Disposition: A | Discharge status: Home / Self Care | Payer: BC Managed Care – PPO | Source: Ambulatory Visit | Attending: Surgery | Admitting: Surgery

## 2019-09-30 VITALS — BP 130/81 | HR 87 | Temp 97.7°F | Resp 18 | Ht 70.5 in | Wt 217.0 lb

## 2019-09-30 DIAGNOSIS — I779 Disorder of arteries and arterioles, unspecified: Secondary | ICD-10-CM

## 2019-09-30 NOTE — Progress Notes (Signed)
Office Note     CC:  follow up Requesting Provider:  Caren Macadam, MD  HPI: [name] is a 69 y.o. (1950-09-26) male who presents rotational atherectomy right AT,rotational atherectomy right popliteal artery, anddrug coated balloon angioplasty of right popliteal artery with 62mm impact by Dr. Donzetta Matters on 07-12-16 by for right lower extremity life-limiting claudication. Hepreviously underwent right superficial femoral artery to below-knee popliteal bypass with great saphenous vein by Dr. Kellie Simmering in 1997. His bypass graft occluded in 2008.  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  He is being evaluated by gastroenterology.  He denies post prandial pain, bloody stools or food fear.  He denies lower extremity pain with exertion or rest pain  The pt is on a statin for cholesterol management.  The pt is on a daily aspirin.   Other AC:  Pletal The pt is on beta blocker for hypertension.   The pt is not diabetic.  Tobacco hx:  Never  S/p aortic valve repair, CABG.  He is followed regularly by [facility].  Past Medical History:  Diagnosis Date  . Aortic stenosis    a. severe bicuspid AV stenosis s/p pericardial tissue valve 2012.  . Arthritis   . Coronary artery disease    a. s/p CABG (LIMA-LCx) at time of AVR 2012, mild nonobstructive LAD and RCA stenoses.  . Depression   . Depression   . ED (erectile dysfunction)   . Fatty liver   . Femoral-popliteal bypass graft occlusion, right [facility]) 1997   Dr. Kellie Simmering  . GERD (gastroesophageal reflux disease)   . Gout   . H/O migraine    up to his 20s  . Headache(784.0)   . Hematuria    negative workup  . History of colonic polyps   . Hyperlipemia, mixed   . Hyperlipidemia   . Hypertension   . Migraines   . 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  . Obesity   . PAD (peripheral artery disease) ([place])   . PONV (postoperative  nausea and vomiting)   . PVD (peripheral vascular disease) ([place])     Past Surgical History:  Procedure Laterality Date  . AORTIC VALVE REPLACEMENT  07/18/2011   Procedure: AORTIC VALVE REPLACEMENT (AVR);  Surgeon: Grace Isaac, MD;  Location: [place];  Service: Open Heart Surgery;  Laterality: N/A;  . CARDIAC CATHETERIZATION  2009, 07/11/11   Dr. Irish Lack  . CORONARY ARTERY BYPASS GRAFT  07/18/2011   Procedure: CORONARY ARTERY BYPASS GRAFTING (CABG);  Surgeon: Grace Isaac, MD;  Location: [place];  Service: Open Heart Surgery;  Laterality: N/A;  times one to mammary artery, left  . fem stent Right 2017  . FEMORAL ARTERY - POPLITEAL ARTERY BYPASS GRAFT    . KNEE ARTHROSCOPY     left  . MRI     to visualize aortic valve  . PERIPHERAL VASCULAR CATHETERIZATION N/A 07/12/2016   Procedure: Abdominal Aortogram w/Lower Extremity;  Surgeon: Waynetta Sandy, MD;  Location: [place] [facility];  Service: Cardiovascular;  Laterality: N/A;  . PERIPHERAL VASCULAR CATHETERIZATION Right 07/12/2016   Procedure: Peripheral Vascular Atherectomy;  Surgeon: Waynetta Sandy, MD;  Location: [place] [facility];  Service: Cardiovascular;  Laterality: Right;  Anterior Tibial and Popliteal  . ROTATOR CUFF REPAIR     Left  . US ECHOCARDIOGRAPHY  Social History   Socioeconomic History  . Marital status: Divorced    Spouse name: Not on file  . Number of children: 4  . Years of education: Not on file  . Highest education level: Bachelor's degree (e.g., BA, AB, BS)  Occupational History  . Occupation: Horticulturist, commercial: Research scientist (life sciences)   Tobacco Use  . Smoking status: Never Smoker  . Smokeless tobacco: Never Used  Substance and Sexual Activity  . Alcohol use: No  . Drug use: No  . Sexual activity: Not on file  Other Topics Concern  . Not on file  Social History Narrative   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   Left handed   Caffeine: about 2 cups daily   Social  Determinants of Health   Financial Resource Strain:   . Difficulty of Paying Living Expenses: Not on file  Food Insecurity:   . Worried About Charity fundraiser in the Last Year: Not on file  . Ran Out of Food in the Last Year: Not on file  Transportation Needs:   . Lack of Transportation (Medical): Not on file  . Lack of Transportation (Non-Medical): Not on file  Physical Activity:   . Days of Exercise per Week: Not on file  . Minutes of Exercise per Session: Not on file  Stress:   . Feeling of Stress : Not on file  Social Connections:   . Frequency of Communication with Friends and Family: Not on file  . Frequency of Social Gatherings with Friends and Family: Not on file  . Attends Religious Services: Not on file  . Active Member of Clubs or Organizations: Not on file  . Attends Archivist Meetings: Not on file  . Marital Status: Not on file  Intimate Partner Violence:   . Fear of Current or Ex-Partner: Not on file  . Emotionally Abused: Not on file  . Physically Abused: Not on file  . Sexually Abused: Not on file    Family History  Problem Relation Age of Onset  . Cancer Father   . Hypertension Father   . Diabetes Father   . Cancer Sister   . Healthy Brother   . Heart disease Maternal Grandmother   . Healthy Maternal Grandfather   . Cancer Maternal Grandfather   . Migraines Neg Hx   . Headache Neg Hx     Current Outpatient Medications  Medication Sig Dispense Refill  . Adalimumab (HUMIRA PEN) 40 MG/0.8ML PNKT Inject 40 mg into the skin every 14 (fourteen) days.    [name] amoxicillin (AMOXIL) 500 MG capsule TAKE 4 CAPSULES ONE HOUR BEFORE DENTAL APPOINTMENT. 8 capsule 0  . aspirin 81 MG tablet Take 1 tablet (81 mg total) by mouth daily.    [name] atorvastatin (LIPITOR) 80 MG tablet Take 1 tablet (80 mg total) by mouth at bedtime. 90 tablet 3  . cilostazol (PLETAL) 100 MG tablet TAKE (1) TABLET TWICE A DAY BEFORE MEALS. 60 tablet 11  . citalopram (CELEXA) 40 MG  tablet Take 40 mg by mouth daily at 12 noon.     . fluticasone (FLONASE) 50 MCG/ACT nasal spray Place 1-2 sprays into both nostrils daily as needed for allergies or rhinitis.    . furosemide (LASIX) 20 MG tablet TAKE 1 TABLET ONCE DAILY. 30 tablet 9  . lansoprazole (PREVACID) 15 MG capsule Take 15 mg by mouth daily as needed. For acid reflux    . metoprolol tartrate (LOPRESSOR) 50 MG tablet  TAKE (1/2) TABLET TWICE DAILY. 90 tablet 2  . pantoprazole (PROTONIX) 40 MG tablet as needed.     . Riboflavin (VITAMIN B-2 PO) Take 400 mg by mouth daily.    . sodium chloride (OCEAN) 0.65 % SOLN nasal spray Place 1 spray into both nostrils as needed for congestion.    . tadalafil (CIALIS) 20 MG tablet Take 20 mg by mouth daily as needed for erectile dysfunction.    . tamsulosin (FLOMAX) 0.4 MG CAPS capsule Take 0.4 mg by mouth daily.     [name] topiramate (TOPAMAX) 25 MG tablet Take 1 tablet (25 mg total) by mouth at bedtime. 30 tablet 5  . traMADol (ULTRAM) 50 MG tablet Take 1-2 tablets (50-100 mg total) by mouth daily as needed. 30 tablet 2   No current facility-administered medications for this visit.    Allergies  Allergen Reactions  . Clopidogrel Itching  . Codeine Other (See Comments)    Blood pressure drops  . Flexeril [Cyclobenzaprine]     sedation  . Morphine And Related Itching  . Septra [Sulfamethoxazole-Trimethoprim]     Dizziness, flushed     REVIEW OF SYSTEMS:   [X]  denotes positive finding, [ ]  denotes negative finding Cardiac  Comments:  Chest pain or chest pressure:    Shortness of breath upon exertion:    Short of breath when lying flat:    Irregular heart rhythm:        Vascular    Pain in calf, thigh, or hip brought on by ambulation:    Pain in feet at night that wakes you up from your sleep:     Blood clot in your veins:    Leg swelling:         Pulmonary    Oxygen at home:    Productive cough:     Wheezing:         Neurologic    Sudden weakness in arms or legs:      Sudden numbness in arms or legs:     Sudden onset of difficulty speaking or slurred speech:    Temporary loss of vision in one eye:     Problems with dizziness:         Gastrointestinal    Blood in stool:     Vomited blood:         Genitourinary    Burning when urinating:     Blood in urine:        Psychiatric    Major depression:         Hematologic    Bleeding problems:    Problems with blood clotting too easily:        Skin    Rashes or ulcers:        Constitutional    Fever or chills:      PHYSICAL EXAMINATION: Vitals:   09/30/19 1544  Weight: 217 lb (98.4 kg)  Height: 5' 10.5" (1.791 m)   General:  WDWN in NAD; vital signs documented above Gait: Normal HENT: WNL, normocephalic Pulmonary: normal non-labored breathing , without Rales, rhonchi,  wheezing Cardiac: regular HR, without  Murmurs without carotid bruits Abdomen: soft, NT, no masses Skin: without rashes Vascular Exam/Pulses: Extremities: without ischemic changes, without Gangrene , without cellulitis; without open wounds;  Musculoskeletal: no muscle wasting or atrophy  Neurologic: A&O X 3;  No focal weakness or paresthesias are detected Psychiatric:  The pt has Normal affect.  Pulse exam: The patient has 2+ palpable bilateral brachial, ulnar,  femoral pulses.  Popliteal pulses and pedal pulses are nonpalpable.  He has brisk monophasic dorsalis pedis and posterior tibial pulse on the right and dorsalis pedis, posterior tibial and peroneal signals on the left  Non-Invasive Vascular Imaging:   July, 2020 ABI/TBI: right: 0.74/0.49  biphasic                 Left: 1.03/.61  biphasic   February, 2021 ABI/TBI: right: 0.92/0.42 biphasic                 Left: .095/1.03  biphasic    ASSESSMENT/PLAN:: 69 y.o. male here for follow up for right lower extremity peripheral vascular disease status post atherectomy.  We discussed doing as much walking as he can considering his GI issues.  Continue medications  as prescribed.  Follow-up in 1 year with ABIs   Barbie Banner, PA-C [facility] [phone]  Clinic MD: Carlis Abbott

## 2019-10-01 ENCOUNTER — Other Ambulatory Visit: Payer: Self-pay | Admitting: *Deleted

## 2019-10-01 DIAGNOSIS — I739 Peripheral vascular disease, unspecified: Secondary | ICD-10-CM

## 2019-10-02 DIAGNOSIS — R109 Unspecified abdominal pain: Secondary | ICD-10-CM | POA: Diagnosis not present

## 2019-10-02 DIAGNOSIS — K589 Irritable bowel syndrome without diarrhea: Secondary | ICD-10-CM | POA: Diagnosis not present

## 2019-10-28 ENCOUNTER — Ambulatory Visit: Payer: BC Managed Care – PPO | Attending: Internal Medicine

## 2019-10-28 DIAGNOSIS — Z23 Encounter for immunization: Secondary | ICD-10-CM

## 2019-10-28 NOTE — Progress Notes (Signed)
   Covid-19 Vaccination Clinic  Name:  [name]    MRN: [id number] DOB: July 01, 1951  10/28/2019  [name] was observed post Covid-19 immunization for 15 minutes without incident. He was provided with Vaccine Information Sheet and instruction to access the V-Safe system.   [name] was instructed to call 911 with any severe reactions post vaccine: [name] Difficulty breathing  . Swelling of face and throat  . A fast heartbeat  . A bad rash all over body  . Dizziness and weakness   Immunizations Administered    Name Date Dose VIS Date Route   Pfizer COVID-19 Vaccine 10/28/2019  8:15 AM 0.3 mL 07/11/2019 Intramuscular   Manufacturer: [place]   Lot: [id number]   [place]: [id number]

## 2019-11-10 ENCOUNTER — Other Ambulatory Visit: Payer: Self-pay | Admitting: Gastroenterology

## 2019-11-10 DIAGNOSIS — K589 Irritable bowel syndrome without diarrhea: Secondary | ICD-10-CM | POA: Diagnosis not present

## 2019-11-10 DIAGNOSIS — R5383 Other fatigue: Secondary | ICD-10-CM | POA: Diagnosis not present

## 2019-11-13 ENCOUNTER — Other Ambulatory Visit: Payer: Self-pay | Admitting: Gastroenterology

## 2019-11-13 DIAGNOSIS — K589 Irritable bowel syndrome without diarrhea: Secondary | ICD-10-CM

## 2019-11-18 ENCOUNTER — Ambulatory Visit
Admission: RE | Admit: 2019-11-18 | Discharge: 2019-11-18 | Disposition: A | Discharge status: Home / Self Care | Payer: BC Managed Care – PPO | Source: Ambulatory Visit | Attending: Gastroenterology | Admitting: Gastroenterology

## 2019-11-18 DIAGNOSIS — R109 Unspecified abdominal pain: Secondary | ICD-10-CM | POA: Diagnosis not present

## 2019-11-18 DIAGNOSIS — K589 Irritable bowel syndrome without diarrhea: Secondary | ICD-10-CM

## 2019-11-18 DIAGNOSIS — R197 Diarrhea, unspecified: Secondary | ICD-10-CM | POA: Diagnosis not present

## 2019-11-18 MED ORDER — IOPAMIDOL (ISOVUE-300) INJECTION 61%
100.0000 mL | Freq: Once | INTRAVENOUS | Status: AC | PRN
  Administered 2019-11-18: 16:00:00 100 mL via INTRAVENOUS

## 2019-11-20 ENCOUNTER — Other Ambulatory Visit: Payer: Self-pay | Admitting: Gastroenterology

## 2019-11-20 DIAGNOSIS — N281 Cyst of kidney, acquired: Secondary | ICD-10-CM

## 2019-11-20 DIAGNOSIS — K862 Cyst of pancreas: Secondary | ICD-10-CM

## 2019-11-25 ENCOUNTER — Other Ambulatory Visit: Payer: BC Managed Care – PPO

## 2019-12-01 DIAGNOSIS — K8689 Other specified diseases of pancreas: Secondary | ICD-10-CM | POA: Diagnosis not present

## 2019-12-01 DIAGNOSIS — N281 Cyst of kidney, acquired: Secondary | ICD-10-CM | POA: Diagnosis not present

## 2019-12-01 DIAGNOSIS — K449 Diaphragmatic hernia without obstruction or gangrene: Secondary | ICD-10-CM | POA: Diagnosis not present

## 2019-12-01 DIAGNOSIS — K869 Disease of pancreas, unspecified: Secondary | ICD-10-CM | POA: Diagnosis not present

## 2019-12-10 ENCOUNTER — Other Ambulatory Visit: Payer: Self-pay | Admitting: Orthopaedic Surgery

## 2019-12-16 ENCOUNTER — Ambulatory Visit: Payer: Self-pay

## 2019-12-16 ENCOUNTER — Ambulatory Visit: Payer: BC Managed Care – PPO | Admitting: Orthopaedic Surgery

## 2019-12-16 ENCOUNTER — Other Ambulatory Visit: Payer: Self-pay

## 2019-12-16 ENCOUNTER — Encounter: Payer: Self-pay | Admitting: Orthopaedic Surgery

## 2019-12-16 DIAGNOSIS — M12812 Other specific arthropathies, not elsewhere classified, left shoulder: Secondary | ICD-10-CM | POA: Diagnosis not present

## 2019-12-16 DIAGNOSIS — G8929 Other chronic pain: Secondary | ICD-10-CM | POA: Diagnosis not present

## 2019-12-16 DIAGNOSIS — M545 Low back pain, unspecified: Secondary | ICD-10-CM

## 2019-12-16 DIAGNOSIS — M75102 Unspecified rotator cuff tear or rupture of left shoulder, not specified as traumatic: Secondary | ICD-10-CM

## 2019-12-16 NOTE — Progress Notes (Signed)
Office Visit Note   Patient: [name]           Date of Birth: 1951/06/18           MRN: [id number] Visit Date: 12/16/2019              Requested by: Caren Macadam, MD [place],  [place] [address] PCP: Caren Macadam, MD   Assessment & Plan: Visit Diagnoses:  1. Rotator cuff tear arthropathy of left shoulder   2. Chronic right-sided low back pain without sciatica     Plan: For the left shoulder we sent him to Dr. Junius Roads for another glenohumeral injection.  For the low back pain and radiculopathy he has failed conservative treatment therefore we will need to obtain MRI to rule out structural abnormalities.  Follow-up after the MRI.  Follow-Up Instructions: Return if symptoms worsen or fail to improve.   Orders:  Orders Placed This Encounter  Procedures  . MR Lumbar Spine w/o contrast  . US Guided Needle Placement - No Linked Charges   No orders of the defined types were placed in this encounter.     Procedures: No procedures performed   Clinical Data: No additional findings.   Subjective: Chief Complaint  Patient presents with  . Left Shoulder - Pain    [name] comes back today for follow-up of his left shoulder pain.  He has recently been doing a lot of yard work which is aggravated the shoulder.  Previous cortisone injection in February did help significantly.  He is looking for another injection today.  In terms of his back he continues to have radiating pain in his low back and posterior buttock into the right leg.   Review of Systems  Constitutional: Negative.   All other systems reviewed and are negative.    Objective: Vital Signs: There were no vitals taken for this visit.  Physical Exam Vitals and nursing note reviewed.  Constitutional:      Appearance: He is well-developed.  Pulmonary:     Effort: Pulmonary effort is normal.  Abdominal:     Palpations: Abdomen is soft.  Skin:    General: Skin is warm.  Neurological:   Mental Status: He is alert and oriented to person, place, and time.  Psychiatric:        Behavior: Behavior normal.        Thought Content: Thought content normal.        Judgment: Judgment normal.     Ortho Exam Exams are unchanged. Specialty Comments:  No specialty comments available.  Imaging: US Guided Needle Placement - No Linked Charges  Result Date: 12/16/2019 Ultrasound-guided left glenohumeral injection: After sterile prep with Betadine, injected 8 cc 1% lidocaine without epinephrine and 40 mg methylprednisolone using a 22-gauge spinal needle, passing the needle from posterior approach into the glenohumeral joint.  Injectate seen filling joint capsule.      PMFS History: Patient Active Problem List   Diagnosis Date Noted  . Chronic right-sided low back pain without sciatica 12/16/2019  . Primary osteoarthritis of left knee 09/16/2019  . Rotator cuff tear arthropathy of left shoulder 09/16/2019  . Primary osteoarthritis of right knee 09/16/2019  . Complex tear of medial meniscus of right knee as current injury 03/04/2019  . Chronic diastolic heart failure ([place]) 11/25/2018  . Bruxism (teeth grinding) 11/25/2018  . Cephalalgia 11/25/2018  . Cerebrovascular disease 11/25/2018  . Morning headache 11/25/2018  . Sleep related headaches 09/26/2018  . Hypertension   .  Chest pain, somewhat atypical 12/28/2013  . Dyspnea on exertion 12/27/2011  . Dizziness 08/15/2011  . Heart valve replaced by other means 07/28/2011  . Aortic stenosis, severe 07/18/2011  . Coronary artery disease 07/06/2011  . Peripheral vascular disease ([place]) 07/06/2011  . Hyperlipidemia 07/06/2011   Past Medical History:  Diagnosis Date  . Aortic stenosis    a. severe bicuspid AV stenosis s/p pericardial tissue valve 2012.  . Arthritis   . Coronary artery disease    a. s/p CABG (LIMA-LCx) at time of AVR 2012, mild nonobstructive LAD and RCA stenoses.  . Depression   . Depression   . ED (erectile  dysfunction)   . Fatty liver   . Femoral-popliteal bypass graft occlusion, right [facility]) 1997   Dr. Kellie Simmering  . GERD (gastroesophageal reflux disease)   . Gout   . H/O migraine    up to his 62s  . Headache(784.0)   . Hematuria    negative workup  . History of colonic polyps   . Hyperlipemia, mixed   . Hyperlipidemia   . Hypertension   . Migraines   . 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  . Obesity   . PAD (peripheral artery disease) ([place])   . PONV (postoperative nausea and vomiting)   . PVD (peripheral vascular disease) ([facility])     Family History  Problem Relation Age of Onset  . Cancer Father   . Hypertension Father   . Diabetes Father   . Cancer Sister   . Healthy Brother   . Heart disease Maternal Grandmother   . Healthy Maternal Grandfather   . Cancer Maternal Grandfather   . Migraines Neg Hx   . Headache Neg Hx     Past Surgical History:  Procedure Laterality Date  . AORTIC VALVE REPLACEMENT  07/18/2011   Procedure: AORTIC VALVE REPLACEMENT (AVR);  Surgeon: Grace Isaac, MD;  Location: [place];  Service: Open Heart Surgery;  Laterality: N/A;  . CARDIAC CATHETERIZATION  2009, 07/11/11   Dr. Irish Lack  . CORONARY ARTERY BYPASS GRAFT  07/18/2011   Procedure: CORONARY ARTERY BYPASS GRAFTING (CABG);  Surgeon: Grace Isaac, MD;  Location: [place];  Service: Open Heart Surgery;  Laterality: N/A;  times one to mammary artery, left  . fem stent Right 2017  . FEMORAL ARTERY - POPLITEAL ARTERY BYPASS GRAFT    . KNEE ARTHROSCOPY     left  . MRI     to visualize aortic valve  . PERIPHERAL VASCULAR CATHETERIZATION N/A 07/12/2016   Procedure: Abdominal Aortogram w/Lower Extremity;  Surgeon: Waynetta Sandy, MD;  Location: [place] [facility];  Service: Cardiovascular;  Laterality: N/A;  . PERIPHERAL VASCULAR CATHETERIZATION Right 07/12/2016   Procedure: Peripheral Vascular Atherectomy;  Surgeon: Waynetta Sandy, MD;   Location: [place] [facility];  Service: Cardiovascular;  Laterality: Right;  Anterior Tibial and Popliteal  . ROTATOR CUFF REPAIR     Left  . US ECHOCARDIOGRAPHY     Social History   Occupational History  . Occupation: Horticulturist, commercial: Research scientist (life sciences)   Tobacco Use  . Smoking status: Never Smoker  . Smokeless tobacco: Never Used  Substance and Sexual Activity  . Alcohol use: No  . Drug use: No  . Sexual activity: Not on file

## 2019-12-16 NOTE — Progress Notes (Signed)
Subjective: Patient is here for ultrasound-guided intra-articular lung glenohumeral injection.  Previous injection helped until a few weeks ago.  Objective: Pain at the extremes of overhead reach and behind the back reach.  Procedure: Ultrasound-guided left glenohumeral injection: After sterile prep with Betadine, injected 8 cc 1% lidocaine without epinephrine and 40 mg methylprednisolone using a 22-gauge spinal needle, passing the needle from posterior approach into the glenohumeral joint.  Injectate seen filling joint capsule.

## 2019-12-18 ENCOUNTER — Other Ambulatory Visit: Payer: BC Managed Care – PPO

## 2019-12-25 ENCOUNTER — Encounter: Payer: Self-pay | Admitting: Adult Health

## 2020-01-01 ENCOUNTER — Other Ambulatory Visit: Payer: Self-pay

## 2020-01-01 ENCOUNTER — Ambulatory Visit: Payer: BC Managed Care – PPO | Admitting: Adult Health

## 2020-01-01 VITALS — BP 122/77 | HR 85 | Wt 217.0 lb

## 2020-01-01 DIAGNOSIS — G4733 Obstructive sleep apnea (adult) (pediatric): Secondary | ICD-10-CM | POA: Diagnosis not present

## 2020-01-01 DIAGNOSIS — Z9989 Dependence on other enabling machines and devices: Secondary | ICD-10-CM | POA: Diagnosis not present

## 2020-01-01 NOTE — Progress Notes (Signed)
PATIENT: [name] DOB: 09-Nov-1950  REASON FOR VISIT: follow up HISTORY FROM: patient  HISTORY OF PRESENT ILLNESS: Today 01/01/20:  [name] is a 69 year old male with a history of obstructive sleep apnea on CPAP.  He reports that he has not been using his CPAP recently because it stopped blowing air.  He states prior to he was not using it very consistently.  He states that his headaches may be slightly better.  He was unable to tolerate Topamax.  States that his headaches usually occur when he wakes up or first thing in the morning.  Reports that his headache is usually over the left eye.  He returns today for an evaluation.  HISTORY [name] is a 69 year old male with a history of obstructive sleep apnea on CPAP.  His download indicates that he uses machine 24 out of 30 days for compliance of 80% he uses machine greater than 4 hours 8 days for compliance of 27%.  On average he uses his machine 3 hours and 27 minutes.  His residual AHI is 7.8 on 5 to 18 cm of water with EPR 3.  Leak in the 95th percentile is 9.5 L/min.  He reports that he continues to have daily headaches.  They typically occur above the left eye.  He states it also feels like he has pressure behind the eye.  He is unsure if this is sinus related.  He denies photophobia and phonophobia.  Denies nausea or vomiting.  States that he either takes tramadol or Tylenol for his headache and sometimes he takes nothing.  He has never seen ENT.  He did try Topamax but only for 2 or 3 days.  States that it made him feel weird.  Returns today for an evaluation. REVIEW OF SYSTEMS: Out of a complete 14 system review of symptoms, the patient complains only of the following symptoms, and all other reviewed systems are negative.  FSS 17 ESS5  ALLERGIES: Allergies  Allergen Reactions  . Clopidogrel Itching  . Codeine Other (See Comments)    Blood pressure drops  . Flexeril [Cyclobenzaprine]     sedation  . Morphine And  Related Itching  . Septra [Sulfamethoxazole-Trimethoprim]     Dizziness, flushed    HOME MEDICATIONS: Outpatient Medications Prior to Visit  Medication Sig Dispense Refill  . Adalimumab (HUMIRA PEN) 40 MG/0.8ML PNKT Inject 40 mg into the skin every 14 (fourteen) days.    [name] amoxicillin (AMOXIL) 500 MG capsule TAKE 4 CAPSULES ONE HOUR BEFORE DENTAL APPOINTMENT. 8 capsule 0  . aspirin 81 MG tablet Take 1 tablet (81 mg total) by mouth daily.    [name] atorvastatin (LIPITOR) 80 MG tablet Take 1 tablet (80 mg total) by mouth at bedtime. 90 tablet 3  . cilostazol (PLETAL) 100 MG tablet TAKE (1) TABLET TWICE A DAY BEFORE MEALS. 60 tablet 11  . citalopram (CELEXA) 40 MG tablet Take 40 mg by mouth daily at 12 noon.     . fluticasone (FLONASE) 50 MCG/ACT nasal spray Place 1-2 sprays into both nostrils daily as needed for allergies or rhinitis.    . furosemide (LASIX) 20 MG tablet TAKE 1 TABLET ONCE DAILY. 30 tablet 9  . lansoprazole (PREVACID) 15 MG capsule Take 15 mg by mouth daily as needed. For acid reflux    . metoprolol tartrate (LOPRESSOR) 50 MG tablet TAKE (1/2) TABLET TWICE DAILY. 90 tablet 2  . pantoprazole (PROTONIX) 40 MG tablet as needed.     . Riboflavin (  VITAMIN B-2 PO) Take 400 mg by mouth daily.    . sodium chloride (OCEAN) 0.65 % SOLN nasal spray Place 1 spray into both nostrils as needed for congestion.    . tadalafil (CIALIS) 20 MG tablet Take 20 mg by mouth daily as needed for erectile dysfunction.    . tamsulosin (FLOMAX) 0.4 MG CAPS capsule Take 0.4 mg by mouth daily.     [name] topiramate (TOPAMAX) 25 MG tablet Take 1 tablet (25 mg total) by mouth at bedtime. 30 tablet 5  . traMADol (ULTRAM) 50 MG tablet TAKE 1 OR 2 TABLETS DAILY AS NEEDED. 14 tablet 0   No facility-administered medications prior to visit.    PAST MEDICAL HISTORY: Past Medical History:  Diagnosis Date  . Aortic stenosis    a. severe bicuspid AV stenosis s/p pericardial tissue valve 2012.  . Arthritis   . Coronary  artery disease    a. s/p CABG (LIMA-LCx) at time of AVR 2012, mild nonobstructive LAD and RCA stenoses.  . Depression   . Depression   . ED (erectile dysfunction)   . Fatty liver   . Femoral-popliteal bypass graft occlusion, right [facility]) 1997   Dr. Kellie Simmering  . GERD (gastroesophageal reflux disease)   . Gout   . H/O migraine    up to his 61s  . Headache(784.0)   . Hematuria    negative workup  . History of colonic polyps   . Hyperlipemia, mixed   . Hyperlipidemia   . Hypertension   . Migraines   . 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  . Obesity   . PAD (peripheral artery disease) ([place])   . PONV (postoperative nausea and vomiting)   . PVD (peripheral vascular disease) ([place])     PAST SURGICAL HISTORY: Past Surgical History:  Procedure Laterality Date  . AORTIC VALVE REPLACEMENT  07/18/2011   Procedure: AORTIC VALVE REPLACEMENT (AVR);  Surgeon: Grace Isaac, MD;  Location: [place];  Service: Open Heart Surgery;  Laterality: N/A;  . CARDIAC CATHETERIZATION  2009, 07/11/11   Dr. Irish Lack  . CORONARY ARTERY BYPASS GRAFT  07/18/2011   Procedure: CORONARY ARTERY BYPASS GRAFTING (CABG);  Surgeon: Grace Isaac, MD;  Location: [place];  Service: Open Heart Surgery;  Laterality: N/A;  times one to mammary artery, left  . fem stent Right 2017  . FEMORAL ARTERY - POPLITEAL ARTERY BYPASS GRAFT    . KNEE ARTHROSCOPY     left  . MRI     to visualize aortic valve  . PERIPHERAL VASCULAR CATHETERIZATION N/A 07/12/2016   Procedure: Abdominal Aortogram w/Lower Extremity;  Surgeon: Waynetta Sandy, MD;  Location: [place] [facility];  Service: Cardiovascular;  Laterality: N/A;  . PERIPHERAL VASCULAR CATHETERIZATION Right 07/12/2016   Procedure: Peripheral Vascular Atherectomy;  Surgeon: Waynetta Sandy, MD;  Location: [place] [facility];  Service: Cardiovascular;  Laterality: Right;  Anterior Tibial and Popliteal  . ROTATOR CUFF REPAIR       Left  . US ECHOCARDIOGRAPHY      FAMILY HISTORY: Family History  Problem Relation Age of Onset  . Cancer Father   . Hypertension Father   . Diabetes Father   . Cancer Sister   . Healthy Brother   . Heart disease Maternal Grandmother   . Healthy Maternal Grandfather   . Cancer Maternal Grandfather   . Migraines Neg Hx   . Headache Neg Hx     SOCIAL  HISTORY: Social History   Socioeconomic History  . Marital status: Divorced    Spouse name: Not on file  . Number of children: 4  . Years of education: Not on file  . Highest education level: Bachelor's degree (e.g., BA, AB, BS)  Occupational History  . Occupation: Horticulturist, commercial: Research scientist (life sciences)   Tobacco Use  . Smoking status: Never Smoker  . Smokeless tobacco: Never Used  Substance and Sexual Activity  . Alcohol use: No  . Drug use: No  . Sexual activity: Not on file  Other Topics Concern  . Not on file  Social History Narrative   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   Left handed   Caffeine: about 2 cups daily   Social Determinants of Health   Financial Resource Strain:   . Difficulty of Paying Living Expenses:   Food Insecurity:   . Worried About Charity fundraiser in the Last Year:   . Arboriculturist in the Last Year:   Transportation Needs:   . Film/video editor (Medical):   [name] Lack of Transportation (Non-Medical):   Physical Activity:   . Days of Exercise per Week:   . Minutes of Exercise per Session:   Stress:   . Feeling of Stress :   Social Connections:   . Frequency of Communication with Friends and Family:   . Frequency of Social Gatherings with Friends and Family:   . Attends Religious Services:   . Active Member of Clubs or Organizations:   . Attends Archivist Meetings:   [name] Marital Status:   Intimate Partner Violence:   . Fear of Current or Ex-Partner:   . Emotionally Abused:   [name] Physically Abused:   . Sexually Abused:       PHYSICAL EXAM  Vitals:    01/01/20 0837  BP: 122/77  Pulse: 85  Weight: 217 lb (98.4 kg)   Body mass index is 30.7 kg/m.  Generalized: Well developed, in no acute distress  Chest: Lungs clear to auscultation bilaterally  Neurological examination  Mentation: Alert oriented to time, place, history taking. Follows all commands speech and language fluent Cranial nerve II-XII: Extraocular movements were full, visual field were full on confrontational test Head turning and shoulder shrug  were normal and symmetric. Motor: The motor testing reveals 5 over 5 strength of all 4 extremities. Good symmetric motor tone is noted throughout.  Sensory: Sensory testing is intact to soft touch on all 4 extremities. No evidence of extinction is noted.  Gait and station: Gait is normal.    DIAGNOSTIC DATA (LABS, IMAGING, TESTING) - I reviewed patient records, labs, notes, testing and imaging myself where available.  Lab Results  Component Value Date   WBC 7.3 04/14/2016   HGB 12.6 (L) 07/12/2016   HCT 37.0 (L) 07/12/2016   MCV 83.1 04/14/2016   PLT 290 04/14/2016      Component Value Date/Time   NA 139 12/15/2016 0857   K 4.3 12/15/2016 0857   CL 99 12/15/2016 0857   CO2 23 12/15/2016 0857   GLUCOSE 191 (H) 12/15/2016 0857   GLUCOSE 112 (H) 07/12/2016 0717   BUN 16 12/15/2016 0857   CREATININE 1.15 12/15/2016 0857   CREATININE 1.20 04/14/2016 1029   CALCIUM 9.1 12/15/2016 0857   PROT 6.6 05/12/2016 0854   ALBUMIN 3.9 05/12/2016 0854   AST 40 (H) 05/12/2016 0854   ALT 38 05/12/2016 0854   ALKPHOS 55 05/12/2016  0854   BILITOT 0.5 05/12/2016 0854   GFRNONAA 67 12/15/2016 0857   GFRAA 78 12/15/2016 0857   Lab Results  Component Value Date   CHOL 122 03/21/2018   HDL 42 03/21/2018   LDLCALC 52 03/21/2018   TRIG 138 03/21/2018   CHOLHDL 2.9 03/21/2018   Lab Results  Component Value Date   HGBA1C 5.8 (H) 07/14/2011       ASSESSMENT AND PLAN 69 y.o. year old male  has a past medical history of Aortic  stenosis, Arthritis, Coronary artery disease, Depression, Depression, ED (erectile dysfunction), Fatty liver, Femoral-popliteal bypass graft occlusion, right ([place]) (1997), GERD (gastroesophageal reflux disease), Gout, H/O migraine, Headache(784.0), Hematuria, History of colonic polyps, Hyperlipemia, mixed, Hyperlipidemia, Hypertension, Migraines, Near syncope, Obesity, PAD (peripheral artery disease) ([place]), PONV (postoperative nausea and vomiting), and PVD (peripheral vascular disease) ([place]). here with:  OSA on CPAP Daily Headache   Advised patient to restart CPAP  If machine is not working appropriately he should take it to his DME company  Encourage patient to use CPAP nightly and > 4 hours each night  Daily headaches could be due to untreated sleep apnea   F/U in 6 months or sooner if needed   I spent 20 minutes of face-to-face and non-face-to-face time with patient.  This included previsit chart review, lab review, study review, order entry, electronic health record documentation, patient education.  Ward Givens, MSN, NP-C 01/01/2020, 8:43 AM [facility] [address], [place], [place] [address] [phone]

## 2020-01-01 NOTE — Patient Instructions (Addendum)
Restart using CPAP nightly and greater than 4 hours each night. If your symptoms worsen or you develop new symptoms please let us know.   Please call [employer] at [phone], and press option 1 when prompted. Their customer service representatives will be glad to assist you. If they are unable to answer, please leave a message and they will call you back. Make sure to leave your name and return phone number.

## 2020-01-07 ENCOUNTER — Other Ambulatory Visit: Payer: Self-pay | Admitting: Orthopaedic Surgery

## 2020-01-17 ENCOUNTER — Other Ambulatory Visit: Payer: BC Managed Care – PPO

## 2020-01-28 ENCOUNTER — Other Ambulatory Visit: Payer: Self-pay | Admitting: Orthopaedic Surgery

## 2020-02-10 DIAGNOSIS — R5383 Other fatigue: Secondary | ICD-10-CM | POA: Diagnosis not present

## 2020-02-16 ENCOUNTER — Other Ambulatory Visit: Payer: Self-pay | Admitting: Orthopaedic Surgery

## 2020-02-16 ENCOUNTER — Other Ambulatory Visit: Payer: Self-pay | Admitting: Cardiovascular Disease

## 2020-02-19 DIAGNOSIS — Z9989 Dependence on other enabling machines and devices: Secondary | ICD-10-CM | POA: Insufficient documentation

## 2020-02-19 DIAGNOSIS — K589 Irritable bowel syndrome without diarrhea: Secondary | ICD-10-CM

## 2020-02-19 DIAGNOSIS — F324 Major depressive disorder, single episode, in partial remission: Secondary | ICD-10-CM | POA: Insufficient documentation

## 2020-02-19 DIAGNOSIS — E669 Obesity, unspecified: Secondary | ICD-10-CM | POA: Insufficient documentation

## 2020-02-19 DIAGNOSIS — N529 Male erectile dysfunction, unspecified: Secondary | ICD-10-CM | POA: Insufficient documentation

## 2020-02-19 HISTORY — DX: Irritable bowel syndrome, unspecified: K58.9

## 2020-02-25 ENCOUNTER — Telehealth: Payer: Self-pay | Admitting: Gastroenterology

## 2020-02-25 NOTE — Telephone Encounter (Signed)
Hey Dr Ardis Hughs, this pt is being referred for chronic diarrhea, he was last seen by [facility] in 11/10/2019, will send records to you for review. Please advise on scheduling.

## 2020-03-26 ENCOUNTER — Ambulatory Visit (HOSPITAL_COMMUNITY): Payer: 59 | Attending: Cardiovascular Disease

## 2020-03-26 ENCOUNTER — Other Ambulatory Visit: Payer: Self-pay

## 2020-03-26 DIAGNOSIS — I359 Nonrheumatic aortic valve disorder, unspecified: Secondary | ICD-10-CM | POA: Insufficient documentation

## 2020-03-26 LAB — ECHOCARDIOGRAM COMPLETE
AR max vel: 1.16 cm2
AV Area VTI: 1.15 cm2
AV Area mean vel: 1.16 cm2
AV Mean grad: 7.6 mmHg
AV Peak grad: 14.4 mmHg
Ao pk vel: 1.9 m/s
Area-P 1/2: 2.31 cm2
S' Lateral: 3 cm

## 2020-03-27 ENCOUNTER — Other Ambulatory Visit: Payer: Self-pay | Admitting: Cardiovascular Disease

## 2020-03-29 ENCOUNTER — Encounter: Payer: Self-pay | Admitting: Cardiovascular Disease

## 2020-03-29 ENCOUNTER — Other Ambulatory Visit: Payer: Self-pay

## 2020-03-29 ENCOUNTER — Ambulatory Visit: Payer: 59 | Admitting: Cardiovascular Disease

## 2020-03-29 VITALS — BP 126/80 | HR 57 | Ht 72.0 in | Wt 216.8 lb

## 2020-03-29 DIAGNOSIS — E782 Mixed hyperlipidemia: Secondary | ICD-10-CM

## 2020-03-29 DIAGNOSIS — I2510 Atherosclerotic heart disease of native coronary artery without angina pectoris: Secondary | ICD-10-CM

## 2020-03-29 DIAGNOSIS — I739 Peripheral vascular disease, unspecified: Secondary | ICD-10-CM

## 2020-03-29 DIAGNOSIS — I359 Nonrheumatic aortic valve disorder, unspecified: Secondary | ICD-10-CM | POA: Diagnosis not present

## 2020-03-29 DIAGNOSIS — I10 Essential (primary) hypertension: Secondary | ICD-10-CM | POA: Diagnosis not present

## 2020-03-29 NOTE — Patient Instructions (Signed)
Medication Instructions:  Your provider recommends that you continue on your current medications as directed. Please refer to the Current Medication list given to you today.   *If you need a refill on your cardiac medications before your next appointment, please call your pharmacy*  Follow-Up: You will be called to schedule your 1 year echo and office visit with Dr. Burt Knack.

## 2020-03-29 NOTE — Progress Notes (Signed)
Cardiology Office Note:    Date:  03/29/2020   ID:  [name], DOB 04-09-1951, MRN [id number]  PCP:  Caren Macadam, MD  [facility] Cardiologist:  Sherren Mocha, MD  [place] Electrophysiologist:  None   Referring MD: Caren Macadam, MD   Chief Complaint  Patient presents with  . Coronary Artery Disease    History of Present Illness:    [name] is a 69 y.o. male with a hx of severe bicuspid aortic valve stenosis status post pericardial tissue valve replacement in 2012.  The patient was also of coronary artery disease and he underwent single-vessel CABG with a LIMA to left complex graft.  The patient has a history of intermittent claudication undergone femoropopliteal bypass and endovascular treatments.  He is followed by vascular surgery.  The patient is doing well at present.  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  He still does yard work without any physical limitation.  He is not as active with walking as he has been in the past.  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  He denies exertional chest pain or pressure, edema, heart palpitations, lightheadedness, or syncope.  He has mild exertional dyspnea which is unchanged over many years.  Past Medical History:  Diagnosis Date  . Aortic stenosis    a. severe bicuspid AV stenosis s/p pericardial tissue valve 2012.  . Arthritis   . Coronary artery disease    a. s/p CABG (LIMA-LCx) at time of AVR 2012, mild nonobstructive LAD and RCA stenoses.  . Depression   . Depression   . ED (erectile dysfunction)   . Fatty liver   . Femoral-popliteal bypass graft occlusion, right [facility]) 1997   Dr. Kellie Simmering  . GERD (gastroesophageal reflux disease)   . Gout   . H/O migraine    up to his 25s  . Headache(784.0)   . Hematuria    negative workup  . History of colonic polyps   . Hyperlipemia, mixed   . Hyperlipidemia   . Hypertension   . Migraines   . 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  . Obesity   . PAD (peripheral artery disease) ([place])   . PONV (postoperative nausea and vomiting)   . PVD (peripheral vascular disease) ([place])     Past Surgical History:  Procedure Laterality Date  . AORTIC VALVE REPLACEMENT  07/18/2011   Procedure: AORTIC VALVE REPLACEMENT (AVR);  Surgeon: Grace Isaac, MD;  Location: [place];  Service: Open Heart Surgery;  Laterality: N/A;  . CARDIAC CATHETERIZATION  2009, 07/11/11   Dr. Irish Lack  . CORONARY ARTERY BYPASS GRAFT  07/18/2011   Procedure: CORONARY ARTERY BYPASS GRAFTING (CABG);  Surgeon: Grace Isaac, MD;  Location: [place];  Service: Open Heart Surgery;  Laterality: N/A;  times one to mammary artery, left  . fem stent Right 2017  . FEMORAL ARTERY - POPLITEAL ARTERY BYPASS GRAFT    . KNEE ARTHROSCOPY     left  . MRI     to visualize aortic valve  . PERIPHERAL VASCULAR CATHETERIZATION N/A 07/12/2016   Procedure: Abdominal Aortogram w/Lower Extremity;  Surgeon: Waynetta Sandy, MD;  Location: [place] [facility];  Service: Cardiovascular;  Laterality: N/A;  . PERIPHERAL VASCULAR CATHETERIZATION Right 07/12/2016   Procedure: Peripheral Vascular Atherectomy;  Surgeon: Waynetta Sandy, MD;  Location: [place] [facility];  Service: Cardiovascular;  Laterality: Right;  Anterior Tibial and  Popliteal  . ROTATOR CUFF REPAIR     Left  . US ECHOCARDIOGRAPHY      Current Medications: Current Meds  Medication Sig  . Adalimumab (HUMIRA PEN) 40 MG/0.8ML PNKT Inject 40 mg into the skin every 14 (fourteen) days.  [name] amoxicillin (AMOXIL) 500 MG capsule TAKE 4 CAPSULES ONE HOUR BEFORE DENTAL APPOINTMENT.  [name] aspirin 81 MG tablet Take 1 tablet (81 mg total) by mouth daily.  [name] atorvastatin (LIPITOR) 80 MG tablet Take 1 tablet (80 mg total) by mouth at bedtime.  . cilostazol (PLETAL) 100 MG tablet TAKE (1) TABLET TWICE A DAY BEFORE MEALS.  . citalopram (CELEXA) 40 MG tablet Take 40 mg by mouth  daily at 12 noon.   . colestipol (COLESTID) 1 g tablet Take 1 g by mouth 2 (two) times daily.  . Cyanocobalamin (VITAMIN B 12) 100 MCG LOZG Take 1 tablet by mouth daily.  . fluticasone (FLONASE) 50 MCG/ACT nasal spray Place 1-2 sprays into both nostrils daily as needed for allergies or rhinitis.  . furosemide (LASIX) 20 MG tablet TAKE 1 TABLET ONCE DAILY.  [name] lansoprazole (PREVACID) 15 MG capsule Take 15 mg by mouth daily as needed. For acid reflux  . metoprolol tartrate (LOPRESSOR) 50 MG tablet TAKE (1/2) TABLET TWICE DAILY.  . pantoprazole (PROTONIX) 40 MG tablet as needed.   . Riboflavin (VITAMIN B-2 PO) Take 400 mg by mouth daily.  . sodium chloride (OCEAN) 0.65 % SOLN nasal spray Place 1 spray into both nostrils as needed for congestion.  . tadalafil (CIALIS) 20 MG tablet Take 20 mg by mouth daily as needed for erectile dysfunction.  . tamsulosin (FLOMAX) 0.4 MG CAPS capsule Take 0.4 mg by mouth daily.   . traMADol (ULTRAM) 50 MG tablet Take 1 tablet (50 mg total) by mouth 2 (two) times daily as needed.     Allergies:   Clopidogrel, Codeine, Flexeril [cyclobenzaprine], Morphine and related, and Septra [sulfamethoxazole-trimethoprim]   Social History   Socioeconomic History  . Marital status: Divorced    Spouse name: Not on file  . Number of children: 4  . Years of education: Not on file  . Highest education level: Bachelor's degree (e.g., BA, AB, BS)  Occupational History  . Occupation: Horticulturist, commercial: Research scientist (life sciences)   Tobacco Use  . Smoking status: Never Smoker  . Smokeless tobacco: Never Used  Vaping Use  . Vaping Use: Never used  Substance and Sexual Activity  . Alcohol use: No  . Drug use: No  . Sexual activity: Not on file  Other Topics Concern  . Not on file  Social History Narrative   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   Left handed   Caffeine: about 2 cups daily   Social Determinants of Health   Financial Resource Strain:   . Difficulty of Paying  Living Expenses: Not on file  Food Insecurity:   . Worried About Charity fundraiser in the Last Year: Not on file  . Ran Out of Food in the Last Year: Not on file  Transportation Needs:   . Lack of Transportation (Medical): Not on file  . Lack of Transportation (Non-Medical): Not on file  Physical Activity:   . Days of Exercise per Week: Not on file  . Minutes of Exercise per Session: Not on file  Stress:   . Feeling of Stress : Not on file  Social Connections:   . Frequency of Communication with Friends and Family: Not  on file  . Frequency of Social Gatherings with Friends and Family: Not on file  . Attends Religious Services: Not on file  . Active Member of Clubs or Organizations: Not on file  . Attends Archivist Meetings: Not on file  . Marital Status: Not on file     Family History: The patient's family history includes Cancer in his father, maternal grandfather, and sister; Diabetes in his father; Healthy in his brother and maternal grandfather; Heart disease in his maternal grandmother; Hypertension in his father. There is no history of Migraines or Headache.  ROS:   Please see the history of present illness.    All other systems reviewed and are negative.  EKGs/Labs/Other Studies Reviewed:    The following studies were reviewed today: Echo 03-26-2020: IMPRESSIONS    1. There is Doppler flow in the perimembranous region both on short axis  and five chamber views. Prior echos have been reviewed and this can be  challenging to distinguish between perimembranous VSD and perivalvular  aortic regurgitation. Given that there  is no regurgitation in short axis of the aortic valve, suspect this is a  VSD and not aortic regurgitation. Consider TEE or CT to better assess if  clinically indicated.  2. Small perimembranous VSD . Left ventricular ejection fraction, by  estimation, is 60 to 65%. The left ventricle has normal function. The left  ventricle has no  regional wall motion abnormalities. There is moderate  concentric left ventricular  hypertrophy. Left ventricular diastolic parameters are consistent with  Grade I diastolic dysfunction (impaired relaxation).  3. Right ventricular systolic function is normal. The right ventricular  size is normal. There is normal pulmonary artery systolic pressure.  4. The mitral valve is normal in structure. No evidence of mitral valve  regurgitation. No evidence of mitral stenosis.  5. The aortic valve has been repaired/replaced. Aortic valve  regurgitation is trivial. No aortic stenosis is present. There is a  pericardial valve present in the aortic position.  6. Aortic dilatation noted. There is mild dilatation of the ascending  aorta measuring 42 mm.  7. The inferior vena cava is normal in size with greater than 50%  respiratory variability, suggesting right atrial pressure of 3 mmHg.   EKG:  EKG is ordered today.  The ekg ordered today demonstrates NSR, LVH with repolarization abnormality, HR 73 bpm.   Recent Labs: No results found for requested labs within last 8760 hours.  Recent Lipid Panel    Component Value Date/Time   CHOL 122 03/21/2018 0755   TRIG 138 03/21/2018 0755   HDL 42 03/21/2018 0755   CHOLHDL 2.9 03/21/2018 0755   CHOLHDL 4.5 05/12/2016 0854   VLDL 28 05/12/2016 0854   LDLCALC 52 03/21/2018 0755    Physical Exam:    VS:  BP 126/80   Pulse (!) 57   Ht 6' (1.829 m)   Wt 216 lb 12.8 oz (98.3 kg)   SpO2 97%   BMI 29.40 kg/m     Wt Readings from Last 3 Encounters:  03/29/20 216 lb 12.8 oz (98.3 kg)  01/01/20 217 lb (98.4 kg)  09/30/19 217 lb (98.4 kg)     GEN:  Well nourished, well developed in no acute distress HEENT: Normal NECK: No JVD; No carotid bruits LYMPHATICS: No lymphadenopathy CARDIAC: RRR, soft SEM at the RUSB (1/6), no diastolic murmur RESPIRATORY:  Clear to auscultation without rales, wheezing or rhonchi  ABDOMEN: Soft, non-tender,  non-distended MUSCULOSKELETAL:  No edema; No  deformity  SKIN: Warm and dry NEUROLOGIC:  Alert and oriented x 3 PSYCHIATRIC:  Normal affect   ASSESSMENT:    1. Aortic valve disorder   2. Coronary artery disease involving native coronary artery of native heart without angina pectoris   3. Essential hypertension   4. Mixed hyperlipidemia   5. Peripheral vascular disease ([place])    PLAN:    In order of problems listed above:  1. The patient's echo study is reviewed.  There is either a small paravalvular leak or a small perimembranous VSD.  Patient does not have a murmur of AI, nor does he have a murmur of a high velocity jet across the interventricular septum.  I suspect he has a small paravalvular leak that is not audible on exam.  We will continue annual follow-up.  His transvalvular gradients remained stable and within expected limits.  He is doing very well overall with no significant limitation related to his aortic valve disease. 2. No symptoms of angina noted.  Continues on aspirin, high intensity statin drug, and a beta-blocker. 3. Blood pressure is well controlled on metoprolol.  Moderate LVH noted on echo. 4. Lipids at goal on atorvastatin 80 mg daily.  LDL cholesterol is 56 mg/dL. 5. He denies claudication symptoms.  He is followed by vascular surgery.  Past history of revascularization surgery and endovascular treatment reviewed.  He is treated with cilostazol.   Medication Adjustments/Labs and Tests Ordered: Current medicines are reviewed at length with the patient today.  Concerns regarding medicines are outlined above.  No orders of the defined types were placed in this encounter.  No orders of the defined types were placed in this encounter.   There are no Patient Instructions on file for this visit.   Signed, Sherren Mocha, MD  03/29/2020 8:22 AM    [facility]

## 2020-03-31 ENCOUNTER — Telehealth: Payer: Self-pay | Admitting: Cardiovascular Disease

## 2020-03-31 NOTE — Telephone Encounter (Signed)
New message:     Patient calling concerning taking medications for sinus. Please call patient back.

## 2020-03-31 NOTE — Telephone Encounter (Signed)
Mr. Siverson states he has started getting headaches similar to the migraines he used to have. He would prefer not to restart prescription meds (he took several, including Imitrex) and his headaches used to respond to Aleve, but he was told not to take much Aleve anymore.  Informed him he may take Aleve on an as needed basis as long as he takes sparingly and watches for signs/symptoms of bleeding. He was grateful for assistance.

## 2020-05-03 ENCOUNTER — Other Ambulatory Visit (INDEPENDENT_AMBULATORY_CARE_PROVIDER_SITE_OTHER): Payer: Self-pay | Admitting: Physician Assistant

## 2020-05-05 DIAGNOSIS — K219 Gastro-esophageal reflux disease without esophagitis: Secondary | ICD-10-CM | POA: Insufficient documentation

## 2020-05-07 ENCOUNTER — Other Ambulatory Visit (INDEPENDENT_AMBULATORY_CARE_PROVIDER_SITE_OTHER): Payer: 59

## 2020-05-07 ENCOUNTER — Ambulatory Visit: Payer: 59 | Admitting: Gastroenterology

## 2020-05-07 ENCOUNTER — Encounter: Payer: Self-pay | Admitting: Gastroenterology

## 2020-05-07 VITALS — BP 110/80 | HR 75 | Ht 72.0 in | Wt 218.0 lb

## 2020-05-07 DIAGNOSIS — K862 Cyst of pancreas: Secondary | ICD-10-CM

## 2020-05-07 LAB — BUN: BUN: 15 mg/dL (ref 6–23)

## 2020-05-07 LAB — CREATININE, SERUM: Creatinine, Ser: 1.05 mg/dL (ref 0.40–1.50)

## 2020-05-07 NOTE — Patient Instructions (Signed)
If you are age 67 or older, your body mass index should be between 23-30. Your Body mass index is 29.57 kg/m. If this is out of the aforementioned range listed, please consider follow up with your Primary Care Provider.  If you are age 59 or younger, your body mass index should be between 19-25. Your Body mass index is 29.57 kg/m. If this is out of the aformentioned range listed, please consider follow up with your Primary Care Provider.   Your provider has requested that you go to the basement level for lab work before leaving today. Press "B" on the elevator. The lab is located at the first door on the left as you exit the elevator.  You have been scheduled for an MRI attn: pancreas at [facility] ([place]) on 05-17-20. Your appointment time is 8:00am. Please arrive 30 minutes prior to your appointment time for registration purposes. Please make certain not to have anything to eat or drink 6 hours prior to your test. In addition, if you have any metal in your body, have a pacemaker or defibrillator, please be sure to let your ordering physician know. This test typically takes 45 minutes to 1 hour to complete. Should you need to reschedule, please call [phone] to do so.  Due to recent changes in healthcare laws, you may see the results of your imaging and laboratory studies on MyChart before your provider has had a chance to review them.  We understand that in some cases there may be results that are confusing or concerning to you. Not all laboratory results come back in the same time frame and the provider may be waiting for multiple results in order to interpret others.  Please give [place] 48 hours in order for your provider to thoroughly review all the results before contacting the office for clarification of your results.   Please start taking citrucel (orange flavored) powder fiber supplement.  This may cause some bloating at first but that usually goes away. Begin with a  small spoonful and work your way up to a large, heaping spoonful daily over a week.  Thank you for entrusting me with your care and choosing [facility].  Dr Ardis Hughs

## 2020-05-07 NOTE — Progress Notes (Signed)
HPI: This is a very pleasant 69 year old man who was referred to me by Caren Macadam, MD  to evaluate change in bowel habits.    About a year ago he suffered a diarrheal illness that lasted for a week or 2.  Ever since then his bowels have not gone back to normal.  He will have hard stools often in the morning with a lot of pushing and straining and then later in the day he will have loose stools.  He sometimes has flushing followed by urge to move his bowels.  He is gaining weight  He has never seen blood in his stool  Colon cancer and pancreatic diseases do not run in his family.  He does not drink much alcohol  He believes he was started on colestipol by his previous gastroenterologist taking it twice daily and this has not helped his BMs.   Old Data Reviewed:  He has been a patient of [facility] for at least the past year or 2.  Dr. Alessandra Bevels.  I was able to review colonoscopy from March 2020 indication rectal bleeding.  Findings normal terminal ileum was found to hemorrhoids.  There were 4-5 subcentimeter polyps that were removed and found to be mixed adenomatous and sessile serrated.  He was found to have hemorrhoids.  Random biopsies of the colon were not performed.  He was recommended to have a repeat colonoscopy at 3-year interval.  I believe he also had an upper endoscopy at the same time however I do not have that report.  I have some biopsies from the upper endoscopy that showed "chronic inactive gastritis that was negative for H. pylori" also reflux type changes in the esophagus at an irregular Z-line it was negative for Barrett's type changes or even a self esophagitis  I see an office note from Dr. Alessandra Bevels at [facility] from April 2021.  This was for diarrhea and nausea.  I believe this was a telemedicine visit.  He explained that patient had lab testing January 2021 including a CBC, complete metabolic profile, TSH, GI pathogen panel and C. difficile and this was  all negative.  Patient had not tried Imodium at that point.  Dr. Alessandra Bevels described a CT scan abdomen pelvis that was done April 2021 per his recommendation.  This showed a 13 mm cyst in the pancreas.  He recommended an MRI as follow-up.  CT scan abdomen pelvis April 2021 impression: 1. No findings to explain the patient's history of diarrhea and abdominal pain. 2. 13 mm low-density lesion in the pancreatic tail, likely cystic with some mild distention of the adjacent main pancreatic duct. MRI of the abdomen without and with contrast recommended to further Evaluate.     Review of systems: Pertinent positive and negative review of systems were noted in the above HPI section. All other review negative.   Past Medical History:  Diagnosis Date  . Aortic stenosis    a. severe bicuspid AV stenosis s/p pericardial tissue valve 2012.  . Arthritis   . Coronary artery disease    a. s/p CABG (LIMA-LCx) at time of AVR 2012, mild nonobstructive LAD and RCA stenoses.  . Depression   . Depression   . ED (erectile dysfunction)   . Fatty liver   . Femoral-popliteal bypass graft occlusion, right [facility]) 1997   Dr. Kellie Simmering  . GERD (gastroesophageal reflux disease)   . Gout   . H/O migraine    up to his 60s  . Headache(784.0)   .  Hematuria    negative workup  . History of colonic polyps   . Hyperlipemia, mixed   . Hyperlipidemia   . Hypertension   . Irritable bowel syndrome 02/19/2020  . Migraines   . 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  . Obesity   . PAD (peripheral artery disease) ([place])   . PONV (postoperative nausea and vomiting)   . PVD (peripheral vascular disease) ([place])     Past Surgical History:  Procedure Laterality Date  . AORTIC VALVE REPLACEMENT  07/18/2011   Procedure: AORTIC VALVE REPLACEMENT (AVR);  Surgeon: Grace Isaac, MD;  Location: [facility];  Service: Open Heart Surgery;  Laterality: N/A;  . CARDIAC CATHETERIZATION  2009,  07/11/11   Dr. Irish Lack  . CORONARY ARTERY BYPASS GRAFT  07/18/2011   Procedure: CORONARY ARTERY BYPASS GRAFTING (CABG);  Surgeon: Grace Isaac, MD;  Location: [facility];  Service: Open Heart Surgery;  Laterality: N/A;  times one to mammary artery, left  . fem stent Right 2017  . FEMORAL ARTERY - POPLITEAL ARTERY BYPASS GRAFT    . KNEE ARTHROSCOPY     left  . MRI     to visualize aortic valve  . PERIPHERAL VASCULAR CATHETERIZATION N/A 07/12/2016   Procedure: Abdominal Aortogram w/Lower Extremity;  Surgeon: Waynetta Sandy, MD;  Location: [facility];  Service: Cardiovascular;  Laterality: N/A;  . PERIPHERAL VASCULAR CATHETERIZATION Right 07/12/2016   Procedure: Peripheral Vascular Atherectomy;  Surgeon: Waynetta Sandy, MD;  Location: [place] [facility];  Service: Cardiovascular;  Laterality: Right;  Anterior Tibial and Popliteal  . ROTATOR CUFF REPAIR     Left  . US ECHOCARDIOGRAPHY      Current Outpatient Medications  Medication Sig Dispense Refill  . Adalimumab (HUMIRA PEN) 40 MG/0.8ML PNKT Inject 40 mg into the skin every 14 (fourteen) days.    [name] amoxicillin (AMOXIL) 500 MG capsule TAKE 4 CAPSULES ONE HOUR BEFORE DENTAL APPOINTMENT. 8 capsule 0  . aspirin 81 MG tablet Take 1 tablet (81 mg total) by mouth daily.    [name] atorvastatin (LIPITOR) 80 MG tablet Take 1 tablet (80 mg total) by mouth at bedtime. 90 tablet 3  . cilostazol (PLETAL) 100 MG tablet TAKE (1) TABLET TWICE A DAY BEFORE MEALS. 60 tablet 11  . citalopram (CELEXA) 40 MG tablet Take 40 mg by mouth daily at 12 noon.     . colestipol (COLESTID) 1 g tablet Take 1 g by mouth 2 (two) times daily.    . Cyanocobalamin (VITAMIN B 12) 100 MCG LOZG Take 1 tablet by mouth daily.    . fluticasone (FLONASE) 50 MCG/ACT nasal spray Place 1-2 sprays into both nostrils daily as needed for allergies or rhinitis.    . furosemide (LASIX) 20 MG tablet TAKE 1 TABLET ONCE DAILY. 30 tablet 9  . lansoprazole (PREVACID) 15  MG capsule Take 15 mg by mouth daily as needed. For acid reflux    . metoprolol tartrate (LOPRESSOR) 50 MG tablet TAKE (1/2) TABLET TWICE DAILY. 90 tablet 3  . pantoprazole (PROTONIX) 40 MG tablet as needed.     . Riboflavin (VITAMIN B-2 PO) Take 400 mg by mouth daily.    . sodium chloride (OCEAN) 0.65 % SOLN nasal spray Place 1 spray into both nostrils as needed for congestion.    . tadalafil (CIALIS) 20 MG tablet Take 20 mg by mouth daily as needed for erectile dysfunction.    . tamsulosin (  FLOMAX) 0.4 MG CAPS capsule Take 0.4 mg by mouth daily.     . traMADol (ULTRAM) 50 MG tablet Take 1 tablet (50 mg total) by mouth 2 (two) times daily as needed. 30 tablet 0   No current facility-administered medications for this visit.    Allergies as of 05/07/2020 - Review Complete 05/07/2020  Allergen Reaction Noted  . Clopidogrel Itching 08/18/2016  . Sulfamethoxazole-trimethoprim Other (See Comments) 09/26/2018  . Codeine Other (See Comments) 07/06/2011  . Flexeril [cyclobenzaprine]  09/26/2018  . Morphine and related Itching 07/14/2011    Family History  Problem Relation Age of Onset  . Hypertension Father   . Diabetes Father   . Lung cancer Father   . Breast cancer Sister   . Healthy Brother   . Heart disease Maternal Grandmother   . Healthy Maternal Grandfather   . Cancer Maternal Grandfather   . Migraines Neg Hx   . Headache Neg Hx   . Colon cancer Neg Hx   . Pancreatic cancer Neg Hx   . Stomach cancer Neg Hx   . Esophageal cancer Neg Hx     Social History   Socioeconomic History  . Marital status: Divorced    Spouse name: Not on file  . Number of children: 4  . Years of education: Not on file  . Highest education level: Bachelor's degree (e.g., BA, AB, BS)  Occupational History  . Occupation: Horticulturist, commercial: Research scientist (life sciences)   Tobacco Use  . Smoking status: Never Smoker  . Smokeless tobacco: Never Used  Vaping Use  . Vaping Use: Never used  Substance and  Sexual Activity  . Alcohol use: No  . Drug use: No  . Sexual activity: Not on file  Other Topics Concern  . Not on file  Social History Narrative   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   Left handed   Caffeine: about 2 cups daily   Social Determinants of Health   Financial Resource Strain:   . Difficulty of Paying Living Expenses: Not on file  Food Insecurity:   . Worried About Charity fundraiser in the Last Year: Not on file  . Ran Out of Food in the Last Year: Not on file  Transportation Needs:   . Lack of Transportation (Medical): Not on file  . Lack of Transportation (Non-Medical): Not on file  Physical Activity:   . Days of Exercise per Week: Not on file  . Minutes of Exercise per Session: Not on file  Stress:   . Feeling of Stress : Not on file  Social Connections:   . Frequency of Communication with Friends and Family: Not on file  . Frequency of Social Gatherings with Friends and Family: Not on file  . Attends Religious Services: Not on file  . Active Member of Clubs or Organizations: Not on file  . Attends Archivist Meetings: Not on file  . Marital Status: Not on file  Intimate Partner Violence:   . Fear of Current or Ex-Partner: Not on file  . Emotionally Abused: Not on file  . Physically Abused: Not on file  . Sexually Abused: Not on file     Physical Exam: BP 110/80   Pulse 75   Ht 6' (1.829 m)   Wt 218 lb (98.9 kg)   BMI 29.57 kg/m  Constitutional: generally well-appearing Psychiatric: alert and oriented x3 Eyes: extraocular movements intact Mouth: oral pharynx moist, no lesions Neck: supple no lymphadenopathy Cardiovascular:  heart regular rate and rhythm Lungs: clear to auscultation bilaterally Abdomen: soft, nontender, nondistended, no obvious ascites, no peritoneal signs, normal bowel sounds Extremities: no lower extremity edema bilaterally Skin: no lesions on visible extremities   Assessment and plan: 69 y.o. male with altered  bowel habits  He has had altered bowel habits since a diarrheal illness about 1 year ago.  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  He has had a colonoscopy as well as upper endoscopy by [facility] gastroenterology in the past year or so.  Those do not need to be repeated now.  He also had a CT scan which suggested a small pancreatic cyst.  I did recommend that that be best evaluated with MRI of the pancreas and we are going to order that for him.  He is going to start taking fiber supplements Citrucel on a daily basis to see if this helps even out his likely postinfectious IBS.    Please see the "Patient Instructions" section for addition details about the plan.   Owens Loffler, MD [facility] 05/07/2020, 11:23 AM  Cc: Caren Macadam, MD  Total time on date of encounter was 45 minutes (this included time spent preparing to see the patient reviewing records; obtaining and/or reviewing separately obtained history; performing a medically appropriate exam and/or evaluation; counseling and educating the patient and family if present; ordering medications, tests or procedures if applicable; and documenting clinical information in the health record).

## 2020-05-17 ENCOUNTER — Ambulatory Visit (HOSPITAL_COMMUNITY)
Admission: RE | Admit: 2020-05-17 | Discharge: 2020-05-17 | Disposition: A | Discharge status: Home / Self Care | Payer: 59 | Source: Ambulatory Visit | Attending: Gastroenterology | Admitting: Gastroenterology

## 2020-05-17 ENCOUNTER — Other Ambulatory Visit: Payer: Self-pay | Admitting: Gastroenterology

## 2020-05-17 ENCOUNTER — Other Ambulatory Visit: Payer: Self-pay

## 2020-05-17 ENCOUNTER — Telehealth: Payer: Self-pay | Admitting: Orthopaedic Surgery

## 2020-05-17 DIAGNOSIS — K862 Cyst of pancreas: Secondary | ICD-10-CM

## 2020-05-17 MED ORDER — GADOBUTROL 1 MMOL/ML IV SOLN
10.0000 mL | Freq: Once | INTRAVENOUS | Status: AC | PRN
  Administered 2020-05-17: 10 mL via INTRAVENOUS

## 2020-05-17 NOTE — Telephone Encounter (Signed)
Pharmacy called on behalf of the pt asking Dr.Xu send refills for tramadol please  [phone]

## 2020-05-18 ENCOUNTER — Other Ambulatory Visit: Payer: Self-pay | Admitting: Physician Assistant

## 2020-05-18 MED ORDER — TRAMADOL HCL 50 MG PO TABS: 50.0000 mg | ORAL_TABLET | Freq: Two times a day (BID) | ORAL | 0 refills | Status: DC | PRN

## 2020-05-18 NOTE — Telephone Encounter (Signed)
noted 

## 2020-05-18 NOTE — Telephone Encounter (Signed)
Sent in

## 2020-05-19 ENCOUNTER — Encounter: Payer: Self-pay | Admitting: Orthopaedic Surgery

## 2020-05-19 ENCOUNTER — Ambulatory Visit: Payer: Self-pay

## 2020-05-19 ENCOUNTER — Other Ambulatory Visit: Payer: Self-pay

## 2020-05-19 ENCOUNTER — Ambulatory Visit: Payer: 59 | Admitting: Orthopaedic Surgery

## 2020-05-19 DIAGNOSIS — G8929 Other chronic pain: Secondary | ICD-10-CM

## 2020-05-19 DIAGNOSIS — M25512 Pain in left shoulder: Secondary | ICD-10-CM | POA: Diagnosis not present

## 2020-05-19 DIAGNOSIS — M65322 Trigger finger, left index finger: Secondary | ICD-10-CM | POA: Diagnosis not present

## 2020-05-19 DIAGNOSIS — M545 Low back pain, unspecified: Secondary | ICD-10-CM

## 2020-05-19 MED ORDER — BUPIVACAINE HCL 0.5 % IJ SOLN
0.3300 mL | INTRAMUSCULAR | Status: AC | PRN
  Administered 2020-05-19: .33 mL

## 2020-05-19 MED ORDER — METHYLPREDNISOLONE ACETATE 40 MG/ML IJ SUSP
13.3300 mg | INTRAMUSCULAR | Status: AC | PRN
  Administered 2020-05-19: 13.33 mg

## 2020-05-19 MED ORDER — LIDOCAINE HCL 1 % IJ SOLN
0.3000 mL | INTRAMUSCULAR | Status: AC | PRN
  Administered 2020-05-19: .3 mL

## 2020-05-19 NOTE — Progress Notes (Signed)
Office Visit Note   Patient: [name]           Date of Birth: 1950/11/23           MRN: [id number] Visit Date: 05/19/2020              Requested by: Caren Macadam, MD [place],  [place] [address] PCP: Caren Macadam, MD   Assessment & Plan: Visit Diagnoses:  1. Chronic left shoulder pain   2. Low back pain, unspecified back pain laterality, unspecified chronicity, unspecified whether sciatica present   3. Trigger finger, left index finger     Plan: Impression is chronic left shoulder rotator cuff arthropathy, lumbar spondylosis, left index trigger finger. Based on discussion of his options for his shoulder and low back he would like to continue shoulder injections as they have provide him with relief in the past. He is not interested in shoulder replacement. For his low back this is stable and sounds like his radicular symptoms have completely resolved. For his index trigger finger this was injected today. He tolerated this well. Follow-up as needed.  Follow-Up Instructions: Return if symptoms worsen or fail to improve.   Orders:  Orders Placed This Encounter  Procedures  . Hand/UE Inj  . XR Lumbar Spine 2-3 Views  . XR Shoulder Left   No orders of the defined types were placed in this encounter.     Procedures: Hand/UE Inj: L index A1 for trigger finger on 05/19/2020 9:17 AM Indications: pain Details: 25 G needle Medications: 0.3 mL lidocaine 1 %; 0.33 mL bupivacaine 0.5 %; 13.33 mg methylPREDNISolone acetate 40 MG/ML Outcome: tolerated well, no immediate complications Consent was given by the patient. Patient was prepped and draped in the usual sterile fashion.       Clinical Data: No additional findings.   Subjective: Chief Complaint  Patient presents with  . Left Shoulder - Pain  . Lower Back - Pain    [name] is here for evaluation of chronic left shoulder pain, chronic low back pain and new problem of left index trigger finger. He  has had trigger fingers before that did well from cortisone injections. He is open to get another injection today. In terms of his shoulder and low back these have been stable and continue to give him chronic pain.   Review of Systems  Constitutional: Negative.   All other systems reviewed and are negative.    Objective: Vital Signs: There were no vitals taken for this visit.  Physical Exam Vitals and nursing note reviewed.  Constitutional:      Appearance: He is well-developed.  Pulmonary:     Effort: Pulmonary effort is normal.  Abdominal:     Palpations: Abdomen is soft.  Skin:    General: Skin is warm.  Neurological:     Mental Status: He is alert and oriented to person, place, and time.  Psychiatric:        Behavior: Behavior normal.        Thought Content: Thought content normal.        Judgment: Judgment normal.     Ortho Exam Left shoulder and low back exams are unchanged.  Left index finger shows tenderness along the flexor tendon with mild triggering. Specialty Comments:  No specialty comments available.  Imaging: No results found.   PMFS History: Patient Active Problem List   Diagnosis Date Noted  . GERD (gastroesophageal reflux disease) 05/05/2020  . Irritable bowel syndrome 02/19/2020  .  Chronic right-sided low back pain without sciatica 12/16/2019  . Primary osteoarthritis of left knee 09/16/2019  . Rotator cuff tear arthropathy of left shoulder 09/16/2019  . Primary osteoarthritis of right knee 09/16/2019  . Complex tear of medial meniscus of right knee as current injury 03/04/2019  . Chronic diastolic heart failure ([place]) 11/25/2018  . Bruxism (teeth grinding) 11/25/2018  . Cephalalgia 11/25/2018  . Cerebrovascular disease 11/25/2018  . Morning headache 11/25/2018  . Sleep related headaches 09/26/2018  . Hypertension   . History of adenomatous polyp of colon 03/14/2016  . Chest pain, somewhat atypical 12/28/2013  . Dyspnea on exertion  12/27/2011  . Dizziness 08/15/2011  . Heart valve replaced by other means 07/28/2011  . Aortic stenosis, severe 07/18/2011  . Coronary artery disease 07/06/2011  . Peripheral vascular disease ([place]) 07/06/2011  . Hyperlipidemia 07/06/2011  . Bicuspid aortic valve 07/06/2011   Past Medical History:  Diagnosis Date  . Aortic stenosis    a. severe bicuspid AV stenosis s/p pericardial tissue valve 2012.  . Arthritis   . Coronary artery disease    a. s/p CABG (LIMA-LCx) at time of AVR 2012, mild nonobstructive LAD and RCA stenoses.  . Depression   . Depression   . ED (erectile dysfunction)   . Fatty liver   . Femoral-popliteal bypass graft occlusion, right [facility]) 1997   Dr. Kellie Simmering  . GERD (gastroesophageal reflux disease)   . Gout   . H/O migraine    up to his 77s  . Headache(784.0)   . Hematuria    negative workup  . History of colonic polyps   . Hyperlipemia, mixed   . Hyperlipidemia   . Hypertension   . Irritable bowel syndrome 02/19/2020  . Migraines   . 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  . Obesity   . PAD (peripheral artery disease) ([place])   . PONV (postoperative nausea and vomiting)   . PVD (peripheral vascular disease) ([facility])     Family History  Problem Relation Age of Onset  . Hypertension Father   . Diabetes Father   . Lung cancer Father   . Breast cancer Sister   . Healthy Brother   . Heart disease Maternal Grandmother   . Healthy Maternal Grandfather   . Cancer Maternal Grandfather   . Migraines Neg Hx   . Headache Neg Hx   . Colon cancer Neg Hx   . Pancreatic cancer Neg Hx   . Stomach cancer Neg Hx   . Esophageal cancer Neg Hx     Past Surgical History:  Procedure Laterality Date  . AORTIC VALVE REPLACEMENT  07/18/2011   Procedure: AORTIC VALVE REPLACEMENT (AVR);  Surgeon: Grace Isaac, MD;  Location: [facility];  Service: Open Heart Surgery;  Laterality: N/A;  . CARDIAC CATHETERIZATION  2009, 07/11/11   Dr.  Irish Lack  . CORONARY ARTERY BYPASS GRAFT  07/18/2011   Procedure: CORONARY ARTERY BYPASS GRAFTING (CABG);  Surgeon: Grace Isaac, MD;  Location: [facility];  Service: Open Heart Surgery;  Laterality: N/A;  times one to mammary artery, left  . fem stent Right 2017  . FEMORAL ARTERY - POPLITEAL ARTERY BYPASS GRAFT    . KNEE ARTHROSCOPY     left  . MRI     to visualize aortic valve  . PERIPHERAL VASCULAR CATHETERIZATION N/A 07/12/2016   Procedure: Abdominal Aortogram w/Lower Extremity;  Surgeon: Waynetta Sandy, MD;  Location: [facility];  Service:  Cardiovascular;  Laterality: N/A;  . PERIPHERAL VASCULAR CATHETERIZATION Right 07/12/2016   Procedure: Peripheral Vascular Atherectomy;  Surgeon: Waynetta Sandy, MD;  Location: [place] [facility];  Service: Cardiovascular;  Laterality: Right;  Anterior Tibial and Popliteal  . ROTATOR CUFF REPAIR     Left  . US ECHOCARDIOGRAPHY     Social History   Occupational History  . Occupation: Horticulturist, commercial: Research scientist (life sciences)   Tobacco Use  . Smoking status: Never Smoker  . Smokeless tobacco: Never Used  Vaping Use  . Vaping Use: Never used  Substance and Sexual Activity  . Alcohol use: No  . Drug use: No  . Sexual activity: Not on file

## 2020-05-19 NOTE — Progress Notes (Signed)
Subjective: Patient is here for ultrasound-guided intra-articular left glenohumeral injection.  Ongoing pain, avoiding surgery.   Objective:  Limited active ROM.  Procedure: Ultrasound-guided left glenohumeral injection: After sterile prep with Betadine, injected 8 cc 1% lidocaine without epinephrine and 40 mg methylprednisolone using a 22-gauge spinal needle, passing the needle from posterior approach into the glenohumeral joint.  Injectate seen filling joint capsule.  Good immediate relief.

## 2020-06-07 ENCOUNTER — Other Ambulatory Visit: Payer: Self-pay | Admitting: Cardiovascular Disease

## 2020-06-26 ENCOUNTER — Other Ambulatory Visit: Payer: Self-pay | Admitting: Physician Assistant

## 2020-07-07 ENCOUNTER — Ambulatory Visit: Payer: 59 | Admitting: Adult Health

## 2020-07-07 ENCOUNTER — Encounter: Payer: Self-pay | Admitting: Adult Health

## 2020-07-07 VITALS — BP 115/69 | HR 98 | Ht 72.0 in | Wt 216.6 lb

## 2020-07-07 DIAGNOSIS — Z9989 Dependence on other enabling machines and devices: Secondary | ICD-10-CM

## 2020-07-07 DIAGNOSIS — G4733 Obstructive sleep apnea (adult) (pediatric): Secondary | ICD-10-CM | POA: Diagnosis not present

## 2020-07-07 NOTE — Progress Notes (Signed)
PATIENT: [name] DOB: [birth year]  REASON FOR VISIT: follow up HISTORY FROM: patient  HISTORY OF PRESENT ILLNESS: Today 07/07/20:  [name] is a 69 year old male with a history of OSA on CPAP. She returns today for follow-up. His download indicates that he uses machine 28 out of 30 days for compliance of 93%.  He uses machine greater than 4 h 22 days for compliance of 73%.  On average he uses his machine 5 h and 49 min.  His residual AHI is 6.5 on 5 to 18 cm of water with EPR of 3.  Leak in the 95th percentile is 9.4 L/min.  His central sleep apneas is 3.3 and his obstructive is 2.4.   he is a mouth breather and he is currently wearing nasal pillows.  He would like to try a different mask that covers his mouth.  HISTORY 01/01/20:  [name] is a 69 year old male with a history of obstructive sleep apnea on CPAP.  He reports that he has not been using his CPAP recently because it stopped blowing air.  He states prior to he was not using it very consistently.  He states that his headaches may be slightly better.  He was unable to tolerate Topamax.  States that his headaches usually occur when he wakes up or first thing in the morning.  Reports that his headache is usually over the left eye.  He returns today for an evaluation.  REVIEW OF SYSTEMS: Out of a complete 14 system review of symptoms, the patient complains only of the following symptoms, and all other reviewed systems are negative.   ESS 6  ALLERGIES: Allergies  Allergen Reactions  . Clopidogrel Itching  . Sulfamethoxazole-Trimethoprim Other (See Comments)    Dizziness, flushed  . Codeine Other (See Comments)    Blood pressure drops  . Flexeril [Cyclobenzaprine]     sedation  . Morphine And Related Itching    HOME MEDICATIONS: Outpatient Medications Prior to Visit  Medication Sig Dispense Refill  . Adalimumab (HUMIRA PEN) 40 MG/0.8ML PNKT Inject 40 mg into the skin every 14 (fourteen) days.    [name] aspirin  81 MG tablet Take 1 tablet (81 mg total) by mouth daily.    [name] atorvastatin (LIPITOR) 80 MG tablet TAKE ONE TABLET AT BEDTIME. 90 tablet 3  . cilostazol (PLETAL) 100 MG tablet TAKE (1) TABLET TWICE A DAY BEFORE MEALS. 60 tablet 11  . citalopram (CELEXA) 40 MG tablet Take 40 mg by mouth daily at 12 noon.     . colestipol (COLESTID) 1 g tablet Take 1 g by mouth 2 (two) times daily.    . Cyanocobalamin (VITAMIN B 12) 100 MCG LOZG Take 1 tablet by mouth daily.    . fluticasone (FLONASE) 50 MCG/ACT nasal spray Place 1-2 sprays into both nostrils daily as needed for allergies or rhinitis.    . furosemide (LASIX) 20 MG tablet TAKE 1 TABLET ONCE DAILY. 30 tablet 9  . lansoprazole (PREVACID) 15 MG capsule Take 15 mg by mouth daily as needed. For acid reflux    . metoprolol tartrate (LOPRESSOR) 50 MG tablet TAKE (1/2) TABLET TWICE DAILY. 90 tablet 3  . pantoprazole (PROTONIX) 40 MG tablet as needed.     . Riboflavin (VITAMIN B-2 PO) Take 400 mg by mouth daily.    . sodium chloride (OCEAN) 0.65 % SOLN nasal spray Place 1 spray into both nostrils as needed for congestion.    . tadalafil (CIALIS) 20 MG  tablet Take 20 mg by mouth daily as needed for erectile dysfunction.    . tamsulosin (FLOMAX) 0.4 MG CAPS capsule Take 0.4 mg by mouth daily.     . traMADol (ULTRAM) 50 MG tablet Take 1 tablet (50 mg total) by mouth 2 (two) times daily as needed. 30 tablet 0  . traMADol (ULTRAM) 50 MG tablet Take 1 tablet (50 mg total) by mouth 2 (two) times daily as needed. 30 tablet 0  . amoxicillin (AMOXIL) 500 MG capsule TAKE 4 CAPSULES ONE HOUR BEFORE DENTAL APPOINTMENT. (Patient not taking: Reported on 07/07/2020) 8 capsule 0   No facility-administered medications prior to visit.    PAST MEDICAL HISTORY: Past Medical History:  Diagnosis Date  . Aortic stenosis    a. severe bicuspid AV stenosis s/p pericardial tissue valve 2012.  . Arthritis   . Coronary artery disease    a. s/p CABG (LIMA-LCx) at time of AVR 2012,  mild nonobstructive LAD and RCA stenoses.  . Depression   . Depression   . Diabetes mellitus without complication ([place])   . ED (erectile dysfunction)   . Fatty liver   . Femoral-popliteal bypass graft occlusion, right [facility]) 1997   Dr. Kellie Simmering  . GERD (gastroesophageal reflux disease)   . Gout   . H/O migraine    up to his 45s  . Headache(784.0)   . Hematuria    negative workup  . History of colonic polyps   . Hyperlipemia, mixed   . Hyperlipidemia   . Hypertension   . Irritable bowel syndrome 02/19/2020  . Migraines   . 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  . Obesity   . PAD (peripheral artery disease) ([place])   . PONV (postoperative nausea and vomiting)   . PVD (peripheral vascular disease) ([place])     PAST SURGICAL HISTORY: Past Surgical History:  Procedure Laterality Date  . AORTIC VALVE REPLACEMENT  07/18/2011   Procedure: AORTIC VALVE REPLACEMENT (AVR);  Surgeon: Grace Isaac, MD;  Location: [place];  Service: Open Heart Surgery;  Laterality: N/A;  . CARDIAC CATHETERIZATION  2009, 07/11/11   Dr. Irish Lack  . CORONARY ARTERY BYPASS GRAFT  07/18/2011   Procedure: CORONARY ARTERY BYPASS GRAFTING (CABG);  Surgeon: Grace Isaac, MD;  Location: [place];  Service: Open Heart Surgery;  Laterality: N/A;  times one to mammary artery, left  . fem stent Right 2017  . FEMORAL ARTERY - POPLITEAL ARTERY BYPASS GRAFT    . KNEE ARTHROSCOPY     left  . MRI     to visualize aortic valve  . PERIPHERAL VASCULAR CATHETERIZATION N/A 07/12/2016   Procedure: Abdominal Aortogram w/Lower Extremity;  Surgeon: Waynetta Sandy, MD;  Location: [place] CV [facility];  Service: Cardiovascular;  Laterality: N/A;  . PERIPHERAL VASCULAR CATHETERIZATION Right 07/12/2016   Procedure: Peripheral Vascular Atherectomy;  Surgeon: Waynetta Sandy, MD;  Location: [place] CV [facility];  Service: Cardiovascular;  Laterality: Right;  Anterior Tibial and Popliteal   . ROTATOR CUFF REPAIR     Left  . US ECHOCARDIOGRAPHY      FAMILY HISTORY: Family History  Problem Relation Age of Onset  . Hypertension Father   . Diabetes Father   . Lung cancer Father   . Breast cancer Sister   . Healthy Brother   . Heart disease Maternal Grandmother   . Healthy Maternal Grandfather   . Cancer Maternal Grandfather   . Migraines Neg Hx   .  Headache Neg Hx   . Colon cancer Neg Hx   . Pancreatic cancer Neg Hx   . Stomach cancer Neg Hx   . Esophageal cancer Neg Hx     SOCIAL HISTORY: Social History   Socioeconomic History  . Marital status: Divorced    Spouse name: Not on file  . Number of children: 4  . Years of education: Not on file  . Highest education level: Bachelor's degree (e.g., BA, AB, BS)  Occupational History  . Occupation: Horticulturist, commercial: Research scientist (life sciences)   Tobacco Use  . Smoking status: Never Smoker  . Smokeless tobacco: Never Used  Vaping Use  . Vaping Use: Never used  Substance and Sexual Activity  . Alcohol use: No  . Drug use: No  . Sexual activity: Not on file  Other Topics Concern  . Not on file  Social History Narrative   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   Left handed   Caffeine: about 2 cups daily   Social Determinants of Health   Financial Resource Strain:   . Difficulty of Paying Living Expenses: Not on file  Food Insecurity:   . Worried About Charity fundraiser in the Last Year: Not on file  . Ran Out of Food in the Last Year: Not on file  Transportation Needs:   . Lack of Transportation (Medical): Not on file  . Lack of Transportation (Non-Medical): Not on file  Physical Activity:   . Days of Exercise per Week: Not on file  . Minutes of Exercise per Session: Not on file  Stress:   . Feeling of Stress : Not on file  Social Connections:   . Frequency of Communication with Friends and Family: Not on file  . Frequency of Social Gatherings with Friends and Family: Not on file  . Attends Religious  Services: Not on file  . Active Member of Clubs or Organizations: Not on file  . Attends Archivist Meetings: Not on file  . Marital Status: Not on file  Intimate Partner Violence:   . Fear of Current or Ex-Partner: Not on file  . Emotionally Abused: Not on file  . Physically Abused: Not on file  . Sexually Abused: Not on file      PHYSICAL EXAM  Vitals:   07/07/20 0752  BP: 115/69  Pulse: 98  SpO2: 100%  Weight: 216 lb 9.6 oz (98.2 kg)  Height: 6' (1.829 m)   Body mass index is 29.38 kg/m.  Generalized: Well developed, in no acute distress  Chest: Lungs clear to auscultation bilaterally  Neurological examination  Mentation: Alert oriented to time, place, history taking. Follows all commands speech and language fluent Cranial nerve II-XII: Extraocular movements were full, visual field were full on confrontational test Head turning and shoulder shrug  were normal and symmetric. Motor: The motor testing reveals 5 over 5 strength of all 4 extremities. Good symmetric motor tone is noted throughout.  Sensory: Sensory testing is intact to soft touch on all 4 extremities. No evidence of extinction is noted.  Gait and station: Gait is normal.    DIAGNOSTIC DATA (LABS, IMAGING, TESTING) - I reviewed patient records, labs, notes, testing and imaging myself where available.  Lab Results  Component Value Date   WBC 7.3 04/14/2016   HGB 12.6 (L) 07/12/2016   HCT 37.0 (L) 07/12/2016   MCV 83.1 04/14/2016   PLT 290 04/14/2016      Component Value Date/Time  NA 139 12/15/2016 0857   K 4.3 12/15/2016 0857   CL 99 12/15/2016 0857   CO2 23 12/15/2016 0857   GLUCOSE 191 (H) 12/15/2016 0857   GLUCOSE 112 (H) 07/12/2016 0717   BUN 15 05/07/2020 1203   BUN 16 12/15/2016 0857   CREATININE 1.05 05/07/2020 1203   CREATININE 1.20 04/14/2016 1029   CALCIUM 9.1 12/15/2016 0857   PROT 6.6 05/12/2016 0854   ALBUMIN 3.9 05/12/2016 0854   AST 40 (H) 05/12/2016 0854   ALT 38  05/12/2016 0854   ALKPHOS 55 05/12/2016 0854   BILITOT 0.5 05/12/2016 0854   GFRNONAA 67 12/15/2016 0857   GFRAA 78 12/15/2016 0857   Lab Results  Component Value Date   CHOL 122 03/21/2018   HDL 42 03/21/2018   LDLCALC 52 03/21/2018   TRIG 138 03/21/2018   CHOLHDL 2.9 03/21/2018   Lab Results  Component Value Date   HGBA1C 5.8 (H) 07/14/2011        ASSESSMENT AND PLAN 69 y.o. year old male  has a past medical history of Aortic stenosis, Arthritis, Coronary artery disease, Depression, Depression, Diabetes mellitus without complication ([place]), ED (erectile dysfunction), Fatty liver, Femoral-popliteal bypass graft occlusion, right ([place]) (1997), GERD (gastroesophageal reflux disease), Gout, H/O migraine, Headache(784.0), Hematuria, History of colonic polyps, Hyperlipemia, mixed, Hyperlipidemia, Hypertension, Irritable bowel syndrome (02/19/2020), Migraines, Near syncope, Obesity, PAD (peripheral artery disease) ([place]), PONV (postoperative nausea and vomiting), and PVD (peripheral vascular disease) ([place]). here with:  1. OSA on CPAP  - CPAP compliance good -Residual AHI is slightly elevated.  We will change to a different style of mask.  If his AHI remains elevated may need to consider titration study for BiPAP consideration - Encourage patient to use CPAP nightly and > 4 hours each night - F/U in 3 months or sooner if needed   I spent 25 minutes of face-to-face and non-face-to-face time with patient.  This included previsit chart review, lab review, study review, order entry, electronic health record documentation, patient education.  Ward Givens, MSN, NP-C 07/07/2020, 8:30 AM [facility] [address] [place], [place] [address] [phone]

## 2020-07-07 NOTE — Patient Instructions (Addendum)
Continue using CPAP nightly and greater than 4 hours each night.  °Mask refitting °If your symptoms worsen or you develop new symptoms please let us know.  ° °

## 2020-07-07 NOTE — Progress Notes (Signed)
Order for Mask refitting (mouth breather) sent to [facility] via [employer]. Confirmation received that the order transmitted was successful.

## 2020-07-09 ENCOUNTER — Telehealth: Payer: Self-pay | Admitting: Orthopaedic Surgery

## 2020-07-09 MED ORDER — TRAMADOL HCL 50 MG PO TABS: 50.0000 mg | ORAL_TABLET | Freq: Two times a day (BID) | ORAL | 0 refills | Status: DC | PRN

## 2020-07-09 NOTE — Telephone Encounter (Signed)
Pt called asking for a refill of his tramadol and he states the pharmacy has been sending a request for a refill for a little over 3 weeks. Pt would like to have this called in and would like to be notified when done.   [phone]

## 2020-07-09 NOTE — Telephone Encounter (Signed)
done

## 2020-07-12 ENCOUNTER — Encounter: Payer: Self-pay | Admitting: Gastroenterology

## 2020-07-12 ENCOUNTER — Ambulatory Visit (INDEPENDENT_AMBULATORY_CARE_PROVIDER_SITE_OTHER): Payer: 59 | Admitting: Gastroenterology

## 2020-07-12 VITALS — BP 118/70 | HR 76 | Ht 69.25 in | Wt 217.5 lb

## 2020-07-12 DIAGNOSIS — K862 Cyst of pancreas: Secondary | ICD-10-CM

## 2020-07-12 DIAGNOSIS — K589 Irritable bowel syndrome without diarrhea: Secondary | ICD-10-CM

## 2020-07-12 NOTE — Patient Instructions (Signed)
If you are age 67 or older, your body mass index should be between 23-30. Your Body mass index is 31.89 kg/m. If this is out of the aforementioned range listed, please consider follow up with your Primary Care Provider.  You will be due for a MRI pancreas in October 2022.  We will call you to get this scheduled.  Please stay well hydrated during the day by drinking 64 ounces of fluid.  Thank you for entrusting me with your care and choosing [facility].  Dr Ardis Hughs

## 2020-07-12 NOTE — Progress Notes (Signed)
Review of pertinent gastrointestinal problems: 1.  Incidental pancreatic cyst.  CT scan April 2021 "13 mm low-density lesion in the pancreatic tail, likely cystic with some mild distention of the adjacent main pancreatic duct.  MRI recommended."  MRI pancreas October 2021.  Cystic lesion in pancreatic tail measuring 16 x 11 x 10 mm.  "No definite communication with the main pancreatic duct.  No thickened septation, solid components or suspicious enhancement".  I recommended repeat MRI at 1 year interval. 2.  Previous patient Dr. Alessandra Bevels from [facility]: Colonoscopy March 2020 indication rectal bleeding.  Findings normal terminal ileum was found to hemorrhoids.  There were 4-5 subcentimeter polyps that were removed and found to be mixed adenomatous and sessile serrated.  He was found to have hemorrhoids.  Random biopsies of the colon were not performed.  He was recommended to have a repeat colonoscopy at 3-year interval.  EGD March 2020, pathology associated showed "chronic inactive gastritis that was negative for H. pylori".  Office note to Dr. Alessandra Bevels April 2021, diarrhea and nausea.  Lab testing January 2021 including a CBC, complete metabolic profile, TSH, GI pathogen panel, C. difficile was all negative" 3.  Presumptive postinfectious IBS, diarrhea predominant.  HPI: This is a very pleasant 69 year old man  I last saw about 2 months ago when he was establishing as a new patient.  I arranged MRI of the pancreas and I also recommended he try fiber supplements on a daily basis to help even out what I thought was likely postinfectious IBS.  We discussed his pancreatic cyst.  He does not have a family history of pancreatic cancer.  Since starting fiber supplements on a daily basis he has noticed a definite improvement in his overall bowel habits.  He is more regular, he has less diarrhea.  He seems to have less pre-BM nausea.  He has had no bleeding.  His weight is overall stable.  He  admits he does not stay very hydrated.  He will still periodically have to push and strain to move his bowels.   ROS: complete GI ROS as described in HPI, all other review negative.  Constitutional:  No unintentional weight loss   Past Medical History:  Diagnosis Date  . Aortic stenosis    a. severe bicuspid AV stenosis s/p pericardial tissue valve 2012.  . Arthritis   . Coronary artery disease    a. s/p CABG (LIMA-LCx) at time of AVR 2012, mild nonobstructive LAD and RCA stenoses.  . Depression   . Depression   . Diabetes mellitus without complication ([place])   . ED (erectile dysfunction)   . Fatty liver   . Femoral-popliteal bypass graft occlusion, right [facility]) 1997   Dr. Kellie Simmering  . GERD (gastroesophageal reflux disease)   . Gout   . H/O migraine    up to his 46s  . Headache(784.0)   . Hematuria    negative workup  . History of colonic polyps   . Hyperlipemia, mixed   . Hyperlipidemia   . Hypertension   . Irritable bowel syndrome 02/19/2020  . Migraines   . 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  . Obesity   . PAD (peripheral artery disease) ([place])   . PONV (postoperative nausea and vomiting)   . PVD (peripheral vascular disease) ([place])     Past Surgical History:  Procedure Laterality Date  . AORTIC VALVE REPLACEMENT  07/18/2011   Procedure: AORTIC VALVE REPLACEMENT (AVR);  Surgeon: Percell Miller  Maryruth Bun, MD;  Location: [place];  Service: Open Heart Surgery;  Laterality: N/A;  . CARDIAC CATHETERIZATION  2009, 07/11/11   Dr. Irish Lack  . CORONARY ARTERY BYPASS GRAFT  07/18/2011   Procedure: CORONARY ARTERY BYPASS GRAFTING (CABG);  Surgeon: Grace Isaac, MD;  Location: [place];  Service: Open Heart Surgery;  Laterality: N/A;  times one to mammary artery, left  . fem stent Right 2017  . FEMORAL ARTERY - POPLITEAL ARTERY BYPASS GRAFT    . KNEE ARTHROSCOPY     left  . MRI     to visualize aortic valve  . PERIPHERAL VASCULAR CATHETERIZATION  N/A 07/12/2016   Procedure: Abdominal Aortogram w/Lower Extremity;  Surgeon: Waynetta Sandy, MD;  Location: [place] CV [facility];  Service: Cardiovascular;  Laterality: N/A;  . PERIPHERAL VASCULAR CATHETERIZATION Right 07/12/2016   Procedure: Peripheral Vascular Atherectomy;  Surgeon: Waynetta Sandy, MD;  Location: [place] [facility];  Service: Cardiovascular;  Laterality: Right;  Anterior Tibial and Popliteal  . ROTATOR CUFF REPAIR     Left  . US ECHOCARDIOGRAPHY      Current Outpatient Medications  Medication Sig Dispense Refill  . Adalimumab 40 MG/0.8ML PNKT Inject 40 mg into the skin every 14 (fourteen) days.    [name] aspirin 81 MG tablet Take 1 tablet (81 mg total) by mouth daily.    [name] atorvastatin (LIPITOR) 80 MG tablet TAKE ONE TABLET AT BEDTIME. 90 tablet 3  . cilostazol (PLETAL) 100 MG tablet TAKE (1) TABLET TWICE A DAY BEFORE MEALS. 60 tablet 11  . citalopram (CELEXA) 40 MG tablet Take 40 mg by mouth daily at 12 noon.    . Cyanocobalamin (VITAMIN B 12) 100 MCG LOZG Take 1 tablet by mouth daily.    . fluticasone (FLONASE) 50 MCG/ACT nasal spray Place 1-2 sprays into both nostrils daily as needed for allergies or rhinitis.    . furosemide (LASIX) 20 MG tablet TAKE 1 TABLET ONCE DAILY. 30 tablet 9  . lansoprazole (PREVACID) 15 MG capsule Take 15 mg by mouth daily as needed. For acid reflux    . metoprolol tartrate (LOPRESSOR) 50 MG tablet TAKE (1/2) TABLET TWICE DAILY. 90 tablet 3  . Riboflavin (VITAMIN B-2 PO) Take 400 mg by mouth daily.    . sodium chloride (OCEAN) 0.65 % SOLN nasal spray Place 1 spray into both nostrils as needed for congestion.    . tadalafil (CIALIS) 20 MG tablet Take 20 mg by mouth daily as needed for erectile dysfunction.    . tamsulosin (FLOMAX) 0.4 MG CAPS capsule Take 0.4 mg by mouth daily.     . traMADol (ULTRAM) 50 MG tablet Take 1 tablet (50 mg total) by mouth 2 (two) times daily as needed. 30 tablet 0  . amoxicillin (AMOXIL) 500 MG capsule  TAKE 4 CAPSULES ONE HOUR BEFORE DENTAL APPOINTMENT. (Patient not taking: No sig reported) 8 capsule 0   No current facility-administered medications for this visit.    Allergies as of 07/12/2020 - Review Complete 07/12/2020  Allergen Reaction Noted  . Clopidogrel Itching 08/18/2016  . Sulfamethoxazole-trimethoprim Other (See Comments) 09/26/2018  . Codeine Other (See Comments) 07/06/2011  . Flexeril [cyclobenzaprine]  09/26/2018  . Morphine and related Itching 07/14/2011    Family History  Problem Relation Age of Onset  . Hypertension Father   . Diabetes Father   . Lung cancer Father   . Breast cancer Sister   . Healthy Brother   . Heart disease Maternal Grandmother   .  Healthy Maternal Grandfather   . Cancer Maternal Grandfather   . Migraines Neg Hx   . Headache Neg Hx   . Colon cancer Neg Hx   . Pancreatic cancer Neg Hx   . Stomach cancer Neg Hx   . Esophageal cancer Neg Hx     Social History   Socioeconomic History  . Marital status: Divorced    Spouse name: Not on file  . Number of children: 4  . Years of education: Not on file  . Highest education level: Bachelor's degree (e.g., BA, AB, BS)  Occupational History  . Occupation: Horticulturist, commercial: Research scientist (life sciences)   Tobacco Use  . Smoking status: Never Smoker  . Smokeless tobacco: Never Used  Vaping Use  . Vaping Use: Never used  Substance and Sexual Activity  . Alcohol use: No  . Drug use: No  . Sexual activity: Not on file  Other Topics Concern  . Not on file  Social History Narrative   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   Left handed   Caffeine: about 2 cups daily   Social Determinants of Health   Financial Resource Strain: Not on file  Food Insecurity: Not on file  Transportation Needs: Not on file  Physical Activity: Not on file  Stress: Not on file  Social Connections: Not on file  Intimate Partner Violence: Not on file     Physical Exam: BP 118/70 (BP Location: Left Arm, Patient  Position: Sitting, Cuff Size: Normal)   Pulse 76   Ht 5' 9.25" (1.759 m) Comment: height measured without shoes  Wt 217 lb 8 oz (98.7 kg)   BMI 31.89 kg/m  Constitutional: generally well-appearing Psychiatric: alert and oriented x3 Abdomen: soft, nontender, nondistended, no obvious ascites, no peritoneal signs, normal bowel sounds No peripheral edema noted in lower extremities  Assessment and plan: 69 y.o. male with incidental pancreatic cyst, alternating bowel habits, likely postinfectious IBS  First he understands that he will need a repeat MRI of his pancreas October 2022 to follow his incidental pancreatic cyst.  We will arrange for that.  Second he has noticed clear improvement since starting fiber supplement on a daily basis.  He still periodically has to push and strain to move his bowels.  He admittedly does not stay very hydrated and I recommended he try to focus more on that, continue to take fiber supplements on a daily basis.  He needs no further testing for his GI issues at this point.  Please see the "Patient Instructions" section for addition details about the plan.  Owens Loffler, MD [facility] 07/12/2020, 9:30 AM   Total time on date of encounter was 30 minutes (this included time spent preparing to see the patient reviewing records; obtaining and/or reviewing separately obtained history; performing a medically appropriate exam and/or evaluation; counseling and educating the patient and family if present; ordering medications, tests or procedures if applicable; and documenting clinical information in the health record).

## 2020-08-01 DIAGNOSIS — U071 COVID-19: Secondary | ICD-10-CM | POA: Diagnosis not present

## 2020-08-01 DIAGNOSIS — R059 Cough, unspecified: Secondary | ICD-10-CM | POA: Diagnosis not present

## 2020-08-04 DIAGNOSIS — U071 COVID-19: Secondary | ICD-10-CM | POA: Diagnosis not present

## 2020-08-10 DIAGNOSIS — Z1159 Encounter for screening for other viral diseases: Secondary | ICD-10-CM | POA: Diagnosis not present

## 2020-08-10 DIAGNOSIS — R0602 Shortness of breath: Secondary | ICD-10-CM | POA: Diagnosis not present

## 2020-08-10 DIAGNOSIS — R9431 Abnormal electrocardiogram [ECG] [EKG]: Secondary | ICD-10-CM | POA: Diagnosis not present

## 2020-08-10 DIAGNOSIS — Z8616 Personal history of COVID-19: Secondary | ICD-10-CM | POA: Diagnosis not present

## 2020-08-17 ENCOUNTER — Other Ambulatory Visit: Payer: Self-pay | Admitting: Orthopaedic Surgery

## 2020-08-17 ENCOUNTER — Other Ambulatory Visit: Payer: Self-pay | Admitting: Cardiovascular Disease

## 2020-08-17 NOTE — Telephone Encounter (Signed)
Pls advise.  

## 2020-08-23 ENCOUNTER — Telehealth: Payer: Self-pay | Admitting: Cardiovascular Disease

## 2020-08-23 NOTE — Telephone Encounter (Signed)
[name] is calling wanting to know if Dr. Burt Knack received his Echo results from [facility] that advised he had Afib.

## 2020-08-23 NOTE — Telephone Encounter (Signed)
The patient called Dr. Burt Knack personally last week reporting he was in afib at [facility]. Records were requested at that time but have not been received. Left message for the patient that records have been requested and Romelle Starcher will be called tomorrow for follow-up.

## 2020-08-24 NOTE — Telephone Encounter (Signed)
Called and requested records from [facility] again since they have not been received.  Phone: [phone]

## 2020-08-25 ENCOUNTER — Encounter: Payer: Self-pay | Admitting: Gastroenterology

## 2020-08-25 ENCOUNTER — Ambulatory Visit (INDEPENDENT_AMBULATORY_CARE_PROVIDER_SITE_OTHER): Payer: 59 | Admitting: Gastroenterology

## 2020-08-25 ENCOUNTER — Other Ambulatory Visit: Payer: Self-pay

## 2020-08-25 VITALS — BP 112/70 | HR 80 | Ht 69.25 in | Wt 213.8 lb

## 2020-08-25 DIAGNOSIS — K625 Hemorrhage of anus and rectum: Secondary | ICD-10-CM

## 2020-08-25 NOTE — Telephone Encounter (Signed)
Records received. EKG obtained does not show afib (confirmed with Truitt Merle, NP).  Called and confirmed with the patient records were obtained and Dr. Burt Knack will review tomorrow and he will be called with his recommendations.  The patient was grateful for call and agrees with plan.

## 2020-08-25 NOTE — Patient Instructions (Signed)
If you are age 68 or older, your body mass index should be between 23-30. Your Body mass index is 31.35 kg/m. If this is out of the aforementioned range listed, please consider follow up with your Primary Care Provider.  Please be careful when using NSAIDS (ex Ibuprofen, Naproxen, Celebrex)  Please call our office if rectal bleeding returns and is significant.  Please use Tylenol for pain.  Thank you for entrusting me with your care and choosing [facility].  Dr Ardis Hughs

## 2020-08-25 NOTE — Progress Notes (Signed)
Review of pertinent gastrointestinal problems: 1.  Incidental pancreatic cyst.  CT scan April 2021 "13 mm low-density lesion in the pancreatic tail, likely cystic with some mild distention of the adjacent main pancreatic duct.  MRI recommended."  MRI pancreas October 2021.  Cystic lesion in pancreatic tail measuring 16 x 11 x 10 mm.  "No definite communication with the main pancreatic duct.  No thickened septation, solid components or suspicious enhancement".  I recommended repeat MRI at 1 year interval. 2.  Previous patient Dr. Alessandra Bevels from [facility]: Colonoscopy March 2020 indication rectal bleeding. Findings normal terminal ileum was found to hemorrhoids. There were 4-5 subcentimeter polyps that were removed and found to be mixed adenomatous and sessile serrated.He was found to have hemorrhoids. Random biopsies of the colon were not performed. He was recommended to have a repeat colonoscopy at 3-year interval.  EGD March 2020, pathology associated showed "chronic inactive gastritis that was negative for H. pylori".  Office note to Dr. Alessandra Bevels April 2021, diarrhea and nausea.  Lab testing January 2021 including a CBC, complete metabolic profile, TSH, GI pathogen panel, C. difficile was all negative" 3.  Presumptive postinfectious IBS, diarrhea predominant.  Improved on fiber supplements.   HPI: This is a very pleasant 70 year old man whom I last saw about a month ago  I last saw him about a month ago here in the office.  He is here today to discuss a new problem.  He had some bright red rectal bleeding mixed with stool for 3 to 4 days.  This was after about 1 month of twice daily 400 mg of ibuprofen for headaches.  His primary care physician had warned him against taking so much ibuprofen and to take Tylenol instead.  He was not getting any upset stomach with the NSAIDs.  He has had no perianal discomfort.  He generally does tend to have to strain to move his bowels.  He does  not stay very hydrated.  Drinks quite a lot of caffeine admittedly.  He has had no abdominal pains.     Review of systems: Pertinent positive and negative review of systems were noted in the above HPI section. All other review negative.   Past Medical History:  Diagnosis Date  . Aortic stenosis    a. severe bicuspid AV stenosis s/p pericardial tissue valve 2012.  . Arthritis   . Coronary artery disease    a. s/p CABG (LIMA-LCx) at time of AVR 2012, mild nonobstructive LAD and RCA stenoses.  . Depression   . Depression   . Diabetes mellitus without complication ([facility] [facility])   . ED (erectile dysfunction)   . Fatty liver   . Femoral-popliteal bypass graft occlusion, right [facility]) 1997   Dr. Kellie Simmering  . GERD (gastroesophageal reflux disease)   . Gout   . H/O migraine    up to his 30s  . Headache(784.0)   . Hematuria    negative workup  . History of colonic polyps   . Hyperlipemia, mixed   . Hyperlipidemia   . Hypertension   . Irritable bowel syndrome 02/19/2020  . Migraines   . 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  . Obesity   . PAD (peripheral artery disease) ([place])   . PONV (postoperative nausea and vomiting)   . PVD (peripheral vascular disease) ([place])     Past Surgical History:  Procedure Laterality Date  . AORTIC VALVE REPLACEMENT  07/18/2011   Procedure: AORTIC VALVE REPLACEMENT (AVR);  Surgeon: Grace Isaac, MD;  Location: [place];  Service: Open Heart Surgery;  Laterality: N/A;  . CARDIAC CATHETERIZATION  2009, 07/11/11   Dr. Irish Lack  . CORONARY ARTERY BYPASS GRAFT  07/18/2011   Procedure: CORONARY ARTERY BYPASS GRAFTING (CABG);  Surgeon: Grace Isaac, MD;  Location: [place];  Service: Open Heart Surgery;  Laterality: N/A;  times one to mammary artery, left  . fem stent Right 2017  . FEMORAL ARTERY - POPLITEAL ARTERY BYPASS GRAFT    . KNEE ARTHROSCOPY     left  . MRI     to visualize aortic valve  . PERIPHERAL VASCULAR  CATHETERIZATION N/A 07/12/2016   Procedure: Abdominal Aortogram w/Lower Extremity;  Surgeon: Waynetta Sandy, MD;  Location: [place] [facility];  Service: Cardiovascular;  Laterality: N/A;  . PERIPHERAL VASCULAR CATHETERIZATION Right 07/12/2016   Procedure: Peripheral Vascular Atherectomy;  Surgeon: Waynetta Sandy, MD;  Location: [place] [facility];  Service: Cardiovascular;  Laterality: Right;  Anterior Tibial and Popliteal  . ROTATOR CUFF REPAIR     Left  . US ECHOCARDIOGRAPHY      Current Outpatient Medications  Medication Sig Dispense Refill  . Adalimumab 40 MG/0.8ML PNKT Inject 40 mg into the skin every 14 (fourteen) days.    [name] amoxicillin (AMOXIL) 500 MG capsule TAKE 4 CAPSULES ONE HOUR BEFORE DENTAL APPOINTMENT. 8 capsule 0  . aspirin 81 MG tablet Take 1 tablet (81 mg total) by mouth daily.    [name] atorvastatin (LIPITOR) 80 MG tablet TAKE ONE TABLET AT BEDTIME. 90 tablet 3  . cilostazol (PLETAL) 100 MG tablet TAKE (1) TABLET TWICE A DAY BEFORE MEALS. 60 tablet 11  . citalopram (CELEXA) 40 MG tablet Take 40 mg by mouth daily at 12 noon.    . Cyanocobalamin (VITAMIN B 12) 100 MCG LOZG Take 1 tablet by mouth daily.    . fluticasone (FLONASE) 50 MCG/ACT nasal spray Place 1-2 sprays into both nostrils daily as needed for allergies or rhinitis.    . furosemide (LASIX) 20 MG tablet TAKE 1 TABLET ONCE DAILY. 30 tablet 11  . lansoprazole (PREVACID) 15 MG capsule Take 15 mg by mouth daily as needed. For acid reflux    . metoprolol tartrate (LOPRESSOR) 50 MG tablet TAKE (1/2) TABLET TWICE DAILY. 90 tablet 3  . Riboflavin (VITAMIN B-2 PO) Take 400 mg by mouth daily.    . sodium chloride (OCEAN) 0.65 % SOLN nasal spray Place 1 spray into both nostrils as needed for congestion.    . tadalafil (CIALIS) 20 MG tablet Take 20 mg by mouth daily as needed for erectile dysfunction.    . tamsulosin (FLOMAX) 0.4 MG CAPS capsule Take 0.4 mg by mouth daily.     . traMADol (ULTRAM) 50 MG  tablet TAKE (1) TABLET TWICE A DAY AS NEEDED. 30 tablet 0   No current facility-administered medications for this visit.    Allergies as of 08/25/2020 - Review Complete 08/25/2020  Allergen Reaction Noted  . Clopidogrel Itching 08/18/2016  . Sulfamethoxazole-trimethoprim Other (See Comments) 09/26/2018  . Codeine Other (See Comments) 07/06/2011  . Flexeril [cyclobenzaprine]  09/26/2018  . Morphine and related Itching 07/14/2011    Family History  Problem Relation Age of Onset  . Hypertension Father   . Diabetes Father   . Lung cancer Father   . Breast cancer Sister   . Healthy Brother   . Heart disease Maternal Grandmother   . Healthy Maternal Grandfather   . Cancer Maternal  Grandfather   . Migraines Neg Hx   . Headache Neg Hx   . Colon cancer Neg Hx   . Pancreatic cancer Neg Hx   . Stomach cancer Neg Hx   . Esophageal cancer Neg Hx     Social History   Socioeconomic History  . Marital status: Divorced    Spouse name: Not on file  . Number of children: 4  . Years of education: Not on file  . Highest education level: Bachelor's degree (e.g., BA, AB, BS)  Occupational History  . Occupation: Horticulturist, commercial: Research scientist (life sciences)   Tobacco Use  . Smoking status: Never Smoker  . Smokeless tobacco: Never Used  Vaping Use  . Vaping Use: Never used  Substance and Sexual Activity  . Alcohol use: No  . Drug use: No  . Sexual activity: Not on file  Other Topics Concern  . Not on file  Social History Narrative   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   Left handed   Caffeine: about 2 cups daily   Social Determinants of Health   Financial Resource Strain: Not on file  Food Insecurity: Not on file  Transportation Needs: Not on file  Physical Activity: Not on file  Stress: Not on file  Social Connections: Not on file  Intimate Partner Violence: Not on file     Physical Exam: Wt 213 lb 12.8 oz (97 kg)   BMI 31.35 kg/m  Constitutional: generally  well-appearing Psychiatric: alert and oriented x3 Eyes: extraocular movements intact Mouth: oral pharynx moist, no lesions Neck: supple no lymphadenopathy Cardiovascular: heart regular rate and rhythm Lungs: clear to auscultation bilaterally Abdomen: soft, nontender, nondistended, no obvious ascites, no peritoneal signs, normal bowel sounds Extremities: no lower extremity edema bilaterally Skin: no lesions on visible extremities Rectal examination: No external anal hemorrhoids, no anal fissures, digital rectal exam palpated no distal rectal masses, stool was brown and not checked for Hemoccult status  Assessment and plan: 70 y.o. male with minor rectal bleeding after 1 month of twice daily NSAIDs  I suspect that he he was having benign, hemorrhoidal type rectal bleeding.  He was found to have hemorrhoids on colonoscopy about 2 years ago by [place], I do not see any on the outside currently.  I suspect he might have some small internal anal hemorrhoids and these were irritated by his periodic straining to move his bowels and then he was also on quite a bit of NSAIDs over the past month.  I recommended that he try to cut back his NSAID use and use Tylenol for his routine aches and pains.  I also recommended he try to stay more focused on being hydrated throughout the day and continue his fiber supplements to see if we can keep him from having to push or strain to move his bowels.  He does not need any testing at this point.  He knows to call if the bleeding returns and he is very concerned about it.    Please see the "Patient Instructions" section for addition details about the plan.   Seth Loffler, MD [facility] 08/25/2020, 9:38 AM  Cc: Seth Macadam, MD  Total time on date of encounter was 35  minutes (this included time spent preparing to see the patient reviewing records; obtaining and/or reviewing separately obtained history; performing a medically appropriate  exam and/or evaluation; counseling and educating the patient and family if present; ordering medications, tests or procedures if applicable; and documenting  clinical information in the health record).

## 2020-08-25 NOTE — Telephone Encounter (Signed)
Records not yet received.   Requested again from [facility]

## 2020-08-26 NOTE — Telephone Encounter (Signed)
Dr. Burt Knack reviewed records received and called patient to confirm he is not in atrial fibrillation.  No new orders at this time.  Will call the patient when Dr. Antionette Char August 2022 schedule is available to arrange echo and office visit with Dr. Burt Knack.

## 2020-08-31 DIAGNOSIS — E119 Type 2 diabetes mellitus without complications: Secondary | ICD-10-CM | POA: Diagnosis not present

## 2020-08-31 DIAGNOSIS — I739 Peripheral vascular disease, unspecified: Secondary | ICD-10-CM | POA: Diagnosis not present

## 2020-08-31 DIAGNOSIS — I10 Essential (primary) hypertension: Secondary | ICD-10-CM | POA: Diagnosis not present

## 2020-08-31 DIAGNOSIS — E782 Mixed hyperlipidemia: Secondary | ICD-10-CM | POA: Diagnosis not present

## 2020-09-21 ENCOUNTER — Other Ambulatory Visit: Payer: Self-pay

## 2020-09-21 ENCOUNTER — Ambulatory Visit (INDEPENDENT_AMBULATORY_CARE_PROVIDER_SITE_OTHER): Payer: BC Managed Care – PPO | Admitting: Orthopaedic Surgery

## 2020-09-21 ENCOUNTER — Encounter: Payer: Self-pay | Admitting: Orthopaedic Surgery

## 2020-09-21 VITALS — Ht 72.0 in | Wt 204.0 lb

## 2020-09-21 DIAGNOSIS — M1711 Unilateral primary osteoarthritis, right knee: Secondary | ICD-10-CM | POA: Diagnosis not present

## 2020-09-21 DIAGNOSIS — M1712 Unilateral primary osteoarthritis, left knee: Secondary | ICD-10-CM | POA: Diagnosis not present

## 2020-09-21 DIAGNOSIS — M12812 Other specific arthropathies, not elsewhere classified, left shoulder: Secondary | ICD-10-CM

## 2020-09-21 DIAGNOSIS — M170 Bilateral primary osteoarthritis of knee: Secondary | ICD-10-CM

## 2020-09-21 MED ORDER — BUPIVACAINE HCL 0.5 % IJ SOLN
2.0000 mL | INTRAMUSCULAR | Status: AC | PRN
  Administered 2020-09-21: 2 mL via INTRA_ARTICULAR

## 2020-09-21 MED ORDER — LIDOCAINE HCL 1 % IJ SOLN
2.0000 mL | INTRAMUSCULAR | Status: AC | PRN
  Administered 2020-09-21: 2 mL

## 2020-09-21 MED ORDER — METHYLPREDNISOLONE ACETATE 40 MG/ML IJ SUSP
40.0000 mg | INTRAMUSCULAR | Status: AC | PRN
  Administered 2020-09-21: 40 mg via INTRA_ARTICULAR

## 2020-09-21 NOTE — Progress Notes (Signed)
Office Visit Note   Patient: [name]           Date of Birth: Feb 17, 1951           MRN: [id number] Visit Date: 09/21/2020              Requested by: Caren Macadam, MD [address],  [place] [address] PCP: Caren Macadam, MD   Assessment & Plan: Visit Diagnoses:  1. Rotator cuff arthropathy of left shoulder   2. Bilateral primary osteoarthritis of knee     Plan: Impression is left shoulder rotator cuff arthropathy and bilateral knee osteoarthritis.  Today, we discussed various treatment options to include cortisone injections.  He would like to proceed with bilateral knee injections today.  He will follow up with Dr. Junius Roads for left shoulder glenohumeral injection.  Follow-up with [place] as needed.  Follow-Up Instructions: Return if symptoms worsen or fail to improve.   Orders:  Orders Placed This Encounter  Procedures  . Large Joint Inj: R knee  . Large Joint Inj: L knee   No orders of the defined types were placed in this encounter.     Procedures: Large Joint Inj: bilateral knee on 09/21/2020 8:55 AM Indications: pain Details: 22 G needle, anterolateral approach Medications (Right): 2 mL lidocaine 1 %; 40 mg methylPREDNISolone acetate 40 MG/ML; 2 mL bupivacaine 0.5 % Medications (Left): 2 mL lidocaine 1 %; 40 mg methylPREDNISolone acetate 40 MG/ML; 2 mL bupivacaine 0.5 %      Clinical Data: No additional findings.   Subjective: Chief Complaint  Patient presents with  . Left Shoulder - Pain  . Left Knee - Pain  . Right Knee - Pain    HPI patient is a very pleasant 70 year old gentleman who comes in today with recurrent left shoulder pain as well as bilateral knee pain right greater than left.  He does have an underlying history of left shoulder rotator cuff arthropathy.  He has been seen by Dr. Junius Roads in the past with ultrasound-guided cortisone injection which was performed in October 2021.  He had moderate relief of symptoms which lasted for  about 3 months.  His pain as well as limited range of motion have returned.  Is having pain throughout the day and at night.  He is taking Tylenol and tramadol without significant relief.  Other issue is bilateral knee pain right greater than left.  The pain he has is primarily to the medial aspect on the right but throughout the entire knee on the left.  He has been trying to walk more which he thinks may be aggravating his symptoms.  This also is what causes the majority of his pain.  He is also having pain at night.  His knees were last injected in October of this past year with good relief of symptoms until about a month ago.  Review of Systems as detailed in HPI.  All others reviewed and are negative.   Objective: Vital Signs: Ht 6' (1.829 m)   Wt 204 lb (92.5 kg)   BMI 27.67 kg/m   Physical Exam well-developed well-nourished gentleman in no acute distress.  Alert oriented x3.  Ortho Exam left shoulder exam shows approximately 25% range of motion all planes.  Positive drop arm.  4 out of 5 strength throughout.  He is neurovascular intact distally.  Bilateral knee exam shows no effusion.  Range of motion 0 to 125 degrees.  Minimal medial joint line tenderness on the right.  No  joint line tenderness on the left.  Moderate patellofemoral crepitus.  Ligaments are stable.  He is neurovascular intact distally.  Specialty Comments:  No specialty comments available.  Imaging: No new imaging   PMFS History: Patient Active Problem List   Diagnosis Date Noted  . GERD (gastroesophageal reflux disease) 05/05/2020  . Irritable bowel syndrome 02/19/2020  . Chronic right-sided low back pain without sciatica 12/16/2019  . Primary osteoarthritis of left knee 09/16/2019  . Rotator cuff tear arthropathy of left shoulder 09/16/2019  . Primary osteoarthritis of right knee 09/16/2019  . Complex tear of medial meniscus of right knee as current injury 03/04/2019  . Chronic diastolic heart failure ([place])  11/25/2018  . Bruxism (teeth grinding) 11/25/2018  . Cephalalgia 11/25/2018  . Cerebrovascular disease 11/25/2018  . Morning headache 11/25/2018  . Sleep related headaches 09/26/2018  . Hypertension   . History of adenomatous polyp of colon 03/14/2016  . Chest pain, somewhat atypical 12/28/2013  . Dyspnea on exertion 12/27/2011  . Dizziness 08/15/2011  . Heart valve replaced by other means 07/28/2011  . Aortic stenosis, severe 07/18/2011  . Coronary artery disease 07/06/2011  . Peripheral vascular disease ([place]) 07/06/2011  . Hyperlipidemia 07/06/2011  . Bicuspid aortic valve 07/06/2011   Past Medical History:  Diagnosis Date  . Aortic stenosis    a. severe bicuspid AV stenosis s/p pericardial tissue valve 2012.  . Arthritis   . Coronary artery disease    a. s/p CABG (LIMA-LCx) at time of AVR 2012, mild nonobstructive LAD and RCA stenoses.  . Depression   . Depression   . Diabetes mellitus without complication ([place])   . ED (erectile dysfunction)   . Fatty liver   . Femoral-popliteal bypass graft occlusion, right [facility]) 1997   Dr. Kellie Simmering  . GERD (gastroesophageal reflux disease)   . Gout   . H/O migraine    up to his 67s  . Headache(784.0)   . Hematuria    negative workup  . History of colonic polyps   . Hyperlipemia, mixed   . Hyperlipidemia   . Hypertension   . Irritable bowel syndrome 02/19/2020  . Migraines   . 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  . Obesity   . PAD (peripheral artery disease) ([place])   . PONV (postoperative nausea and vomiting)   . PVD (peripheral vascular disease) ([facility])     Family History  Problem Relation Age of Onset  . Hypertension Father   . Diabetes Father   . Lung cancer Father   . Breast cancer Sister   . Healthy Brother   . Heart disease Maternal Grandmother   . Healthy Maternal Grandfather   . Cancer Maternal Grandfather   . Migraines Neg Hx   . Headache Neg Hx   . Colon cancer Neg Hx   .  Pancreatic cancer Neg Hx   . Stomach cancer Neg Hx   . Esophageal cancer Neg Hx     Past Surgical History:  Procedure Laterality Date  . AORTIC VALVE REPLACEMENT  07/18/2011   Procedure: AORTIC VALVE REPLACEMENT (AVR);  Surgeon: Grace Isaac, MD;  Location: [place];  Service: Open Heart Surgery;  Laterality: N/A;  . CARDIAC CATHETERIZATION  2009, 07/11/11   Dr. Irish Lack  . CORONARY ARTERY BYPASS GRAFT  07/18/2011   Procedure: CORONARY ARTERY BYPASS GRAFTING (CABG);  Surgeon: Grace Isaac, MD;  Location: [place];  Service: Open Heart Surgery;  Laterality: N/A;  times one  to mammary artery, left  . fem stent Right 2017  . FEMORAL ARTERY - POPLITEAL ARTERY BYPASS GRAFT    . KNEE ARTHROSCOPY     left  . MRI     to visualize aortic valve  . PERIPHERAL VASCULAR CATHETERIZATION N/A 07/12/2016   Procedure: Abdominal Aortogram w/Lower Extremity;  Surgeon: Waynetta Sandy, MD;  Location: [place] [facility];  Service: Cardiovascular;  Laterality: N/A;  . PERIPHERAL VASCULAR CATHETERIZATION Right 07/12/2016   Procedure: Peripheral Vascular Atherectomy;  Surgeon: Waynetta Sandy, MD;  Location: [facility];  Service: Cardiovascular;  Laterality: Right;  Anterior Tibial and Popliteal  . ROTATOR CUFF REPAIR     Left  . US ECHOCARDIOGRAPHY     Social History   Occupational History  . Occupation: Horticulturist, commercial: Research scientist (life sciences)   Tobacco Use  . Smoking status: Never Smoker  . Smokeless tobacco: Never Used  Vaping Use  . Vaping Use: Never used  Substance and Sexual Activity  . Alcohol use: No  . Drug use: No  . Sexual activity: Not on file

## 2020-09-26 ENCOUNTER — Other Ambulatory Visit: Payer: Self-pay | Admitting: Physician Assistant

## 2020-09-27 ENCOUNTER — Other Ambulatory Visit: Payer: Self-pay

## 2020-09-27 ENCOUNTER — Encounter: Payer: Self-pay | Admitting: Family Medicine

## 2020-09-27 ENCOUNTER — Ambulatory Visit: Payer: Self-pay

## 2020-09-27 ENCOUNTER — Ambulatory Visit (INDEPENDENT_AMBULATORY_CARE_PROVIDER_SITE_OTHER): Payer: BC Managed Care – PPO | Admitting: Family Medicine

## 2020-09-27 DIAGNOSIS — M25512 Pain in left shoulder: Secondary | ICD-10-CM

## 2020-09-27 DIAGNOSIS — G8929 Other chronic pain: Secondary | ICD-10-CM

## 2020-09-27 NOTE — Progress Notes (Signed)
Subjective: Patient is here for ultrasound-guided intra-articular left glenohumeral injection.  Previous injection in May helped until he got sick with COVID at the end of the year.  Objective: Full range of motion but pain at the extremes.  Procedure: Ultrasound guided injection is preferred based studies that show increased duration, increased effect, greater accuracy, decreased procedural pain, increased response rate, and decreased cost with ultrasound guided versus blind injection.   Verbal informed consent obtained.  Time-out conducted.  Noted no overlying erythema, induration, or other signs of local infection. Ultrasound-guided left glenohumeral injection: After sterile prep with Betadine, injected 4 cc 0.25% bupivocaine without epinephrine and 6 mg betamethasone using a 22-gauge spinal needle, passing the needle from posterior approach into the glenohumeral joint.  Injectate was seen filling the joint capsule.

## 2020-09-29 DIAGNOSIS — E119 Type 2 diabetes mellitus without complications: Secondary | ICD-10-CM | POA: Diagnosis not present

## 2020-09-30 DIAGNOSIS — Z79899 Other long term (current) drug therapy: Secondary | ICD-10-CM | POA: Diagnosis not present

## 2020-09-30 DIAGNOSIS — L4050 Arthropathic psoriasis, unspecified: Secondary | ICD-10-CM | POA: Diagnosis not present

## 2020-09-30 DIAGNOSIS — N400 Enlarged prostate without lower urinary tract symptoms: Secondary | ICD-10-CM | POA: Insufficient documentation

## 2020-10-01 ENCOUNTER — Telehealth: Payer: Self-pay | Admitting: Physician Assistant

## 2020-10-01 NOTE — Telephone Encounter (Signed)
Please advise 

## 2020-10-01 NOTE — Telephone Encounter (Signed)
Received call from [facility] with [facility] needing to get Rx  (Tramadol) filled for the patient. The number to contact Shirlee Limerick is [phone]

## 2020-10-04 ENCOUNTER — Ambulatory Visit (HOSPITAL_COMMUNITY)
Admission: RE | Admit: 2020-10-04 | Discharge: 2020-10-04 | Disposition: A | Discharge status: Home / Self Care | Payer: 59 | Source: Ambulatory Visit | Attending: Vascular Surgery | Admitting: Vascular Surgery

## 2020-10-04 ENCOUNTER — Ambulatory Visit: Payer: BC Managed Care – PPO | Admitting: Physician Assistant

## 2020-10-04 ENCOUNTER — Other Ambulatory Visit: Payer: Self-pay | Admitting: Physician Assistant

## 2020-10-04 ENCOUNTER — Other Ambulatory Visit: Payer: Self-pay

## 2020-10-04 VITALS — BP 117/83 | HR 82 | Temp 98.2°F | Resp 20 | Ht 72.0 in | Wt 210.7 lb

## 2020-10-04 DIAGNOSIS — I779 Disorder of arteries and arterioles, unspecified: Secondary | ICD-10-CM

## 2020-10-04 DIAGNOSIS — E118 Type 2 diabetes mellitus with unspecified complications: Secondary | ICD-10-CM | POA: Insufficient documentation

## 2020-10-04 DIAGNOSIS — I739 Peripheral vascular disease, unspecified: Secondary | ICD-10-CM | POA: Insufficient documentation

## 2020-10-04 DIAGNOSIS — N200 Calculus of kidney: Secondary | ICD-10-CM | POA: Insufficient documentation

## 2020-10-04 MED ORDER — TRAMADOL HCL 50 MG PO TABS: ORAL_TABLET | ORAL | 0 refills | Status: DC

## 2020-10-04 NOTE — Telephone Encounter (Signed)
Sent in

## 2020-10-04 NOTE — Progress Notes (Signed)
HISTORY AND PHYSICAL     CC:  follow up. Requesting Provider:  Caren Macadam, MD  HPI: This is a 70 y.o. male who is here today for follow up for PAD.  He has hx of rotational atherectomy right AT,rotational atherectomy right popliteal artery, anddrug coated balloon angioplasty of right popliteal artery with 65mm impact by Dr. Donzetta Matters on 07-12-16 by for right lower extremity life-limiting claudication. Hepreviously underwent right superficial femoral artery to below-knee popliteal bypass with great saphenous vein by Dr. Kellie Simmering in 1997. His bypass graft occluded in 2008.  Pt was last seen March 2021 and at that time, he has retired from his job as a Garment/textile technologist and was walking less due to some recent GI issues.  He was not having any post prandial pian, bloody stools or fear of foot.  He did not have any lower extremity pain or rest pain.    The pt returns today for follow up.  He states he is doing well.  He does not have any cramping with walking.  He does get some soreness in the right calf after being active but is not claudication, which he has had in the past.  He does not have any non healing wounds or rest pain.  He tells me when Dr. Kellie Simmering did his bypass surgery on the right leg, he took vein out of the left leg for this.  He states he has been diagnosed with DM2 since his last visit and is currently diet controlled.    The pt is on a statin for cholesterol management.    The pt is on an aspirin.    Other AC:  none The pt is on BB for hypertension.  The pt does not have diabetes. Tobacco hx:  never  Pt does not have family hx of AAA.  Past Medical History:  Diagnosis Date  . Aortic stenosis    a. severe bicuspid AV stenosis s/p pericardial tissue valve 2012.  . Arthritis   . Coronary artery disease    a. s/p CABG (LIMA-LCx) at time of AVR 2012, mild nonobstructive LAD and RCA stenoses.  . Depression   . Depression   . Diabetes mellitus without complication ([place])   . ED  (erectile dysfunction)   . Fatty liver   . Femoral-popliteal bypass graft occlusion, right [facility]) 1997   Dr. Kellie Simmering  . GERD (gastroesophageal reflux disease)   . Gout   . H/O migraine    up to his 60s  . Headache(784.0)   . Hematuria    negative workup  . History of colonic polyps   . Hyperlipemia, mixed   . Hyperlipidemia   . Hypertension   . Irritable bowel syndrome 02/19/2020  . Migraines   . 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  . Obesity   . PAD (peripheral artery disease) ([place])   . PONV (postoperative nausea and vomiting)   . PVD (peripheral vascular disease) ([place])     Past Surgical History:  Procedure Laterality Date  . AORTIC VALVE REPLACEMENT  07/18/2011   Procedure: AORTIC VALVE REPLACEMENT (AVR);  Surgeon: Grace Isaac, MD;  Location: [facility];  Service: Open Heart Surgery;  Laterality: N/A;  . CARDIAC CATHETERIZATION  2009, 07/11/11   Dr. Irish Lack  . CORONARY ARTERY BYPASS GRAFT  07/18/2011   Procedure: CORONARY ARTERY BYPASS GRAFTING (CABG);  Surgeon: Grace Isaac, MD;  Location: [facility];  Service: Open Heart Surgery;  Laterality: N/A;  times one to mammary artery, left  . fem stent Right 2017  . FEMORAL ARTERY - POPLITEAL ARTERY BYPASS GRAFT    . KNEE ARTHROSCOPY     left  . MRI     to visualize aortic valve  . PERIPHERAL VASCULAR CATHETERIZATION N/A 07/12/2016   Procedure: Abdominal Aortogram w/Lower Extremity;  Surgeon: Waynetta Sandy, MD;  Location: [facility];  Service: Cardiovascular;  Laterality: N/A;  . PERIPHERAL VASCULAR CATHETERIZATION Right 07/12/2016   Procedure: Peripheral Vascular Atherectomy;  Surgeon: Waynetta Sandy, MD;  Location: [place] [facility];  Service: Cardiovascular;  Laterality: Right;  Anterior Tibial and Popliteal  . ROTATOR CUFF REPAIR     Left  . US ECHOCARDIOGRAPHY      Allergies  Allergen Reactions  . Clopidogrel Itching  . Sulfamethoxazole-Trimethoprim  Other (See Comments)    Dizziness, flushed  . Codeine Other (See Comments)    Blood pressure drops  . Flexeril [Cyclobenzaprine]     sedation  . Morphine And Related Itching    Current Outpatient Medications  Medication Sig Dispense Refill  . Adalimumab 40 MG/0.8ML PNKT Inject 40 mg into the skin every 14 (fourteen) days.    [name] amoxicillin (AMOXIL) 500 MG capsule TAKE 4 CAPSULES ONE HOUR BEFORE DENTAL APPOINTMENT. 8 capsule 0  . aspirin 81 MG tablet Take 1 tablet (81 mg total) by mouth daily.    [name] atorvastatin (LIPITOR) 80 MG tablet TAKE ONE TABLET AT BEDTIME. 90 tablet 3  . cilostazol (PLETAL) 100 MG tablet TAKE (1) TABLET TWICE A DAY BEFORE MEALS. 60 tablet 11  . citalopram (CELEXA) 40 MG tablet Take 40 mg by mouth daily at 12 noon.    . Cyanocobalamin (VITAMIN B 12) 100 MCG LOZG Take 1 tablet by mouth daily.    . fluticasone (FLONASE) 50 MCG/ACT nasal spray Place 1-2 sprays into both nostrils daily as needed for allergies or rhinitis.    . furosemide (LASIX) 20 MG tablet TAKE 1 TABLET ONCE DAILY. 30 tablet 11  . lansoprazole (PREVACID) 15 MG capsule Take 15 mg by mouth daily as needed. For acid reflux    . metoprolol tartrate (LOPRESSOR) 50 MG tablet TAKE (1/2) TABLET TWICE DAILY. 90 tablet 3  . Riboflavin (VITAMIN B-2 PO) Take 400 mg by mouth daily.    . sodium chloride (OCEAN) 0.65 % SOLN nasal spray Place 1 spray into both nostrils as needed for congestion.    . tadalafil (CIALIS) 20 MG tablet Take 20 mg by mouth daily as needed for erectile dysfunction.    . tamsulosin (FLOMAX) 0.4 MG CAPS capsule Take 0.4 mg by mouth daily.     . traMADol (ULTRAM) 50 MG tablet TAKE (1) TABLET TWICE A DAY AS NEEDED. 30 tablet 0   No current facility-administered medications for this visit.    Family History  Problem Relation Age of Onset  . Hypertension Father   . Diabetes Father   . Lung cancer Father   . Breast cancer Sister   . Healthy Brother   . Heart disease Maternal Grandmother   .  Healthy Maternal Grandfather   . Cancer Maternal Grandfather   . Migraines Neg Hx   . Headache Neg Hx   . Colon cancer Neg Hx   . Pancreatic cancer Neg Hx   . Stomach cancer Neg Hx   . Esophageal cancer Neg Hx     Social History   Socioeconomic History  . Marital status: Divorced    Spouse name:  Not on file  . Number of children: 4  . Years of education: Not on file  . Highest education level: Bachelor's degree (e.g., BA, AB, BS)  Occupational History  . Occupation: Horticulturist, commercial: Research scientist (life sciences)   Tobacco Use  . Smoking status: Never Smoker  . Smokeless tobacco: Never Used  Vaping Use  . Vaping Use: Never used  Substance and Sexual Activity  . Alcohol use: No  . Drug use: No  . Sexual activity: Not on file  Other Topics Concern  . Not on file  Social History Narrative   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   Left handed   Caffeine: about 2 cups daily   Social Determinants of Health   Financial Resource Strain: Not on file  Food Insecurity: Not on file  Transportation Needs: Not on file  Physical Activity: Not on file  Stress: Not on file  Social Connections: Not on file  Intimate Partner Violence: Not on file     REVIEW OF SYSTEMS:   [X]  denotes positive finding, [ ]  denotes negative finding Cardiac  Comments:  Chest pain or chest pressure:    Shortness of breath upon exertion:    Short of breath when lying flat:    Irregular heart rhythm:        Vascular    Pain in calf, thigh, or hip brought on by ambulation: x See HPI  Pain in feet at night that wakes you up from your sleep:     Blood clot in your veins:    Leg swelling:         Pulmonary    Oxygen at home:    Productive cough:     Wheezing:         Neurologic    Sudden weakness in arms or legs:     Sudden numbness in arms or legs:     Sudden onset of difficulty speaking or slurred speech:    Temporary loss of vision in one eye:     Problems with dizziness:          Gastrointestinal    Blood in stool:     Vomited blood:         Genitourinary    Burning when urinating:     Blood in urine:        Psychiatric    Major depression:         Hematologic    Bleeding problems:    Problems with blood clotting too easily:        Skin    Rashes or ulcers:        Constitutional    Fever or chills:      PHYSICAL EXAMINATION:  Today's Vitals   10/04/20 1153  BP: 117/83  Pulse: 82  Resp: 20  Temp: 98.2 F (36.8 C)  TempSrc: Temporal  SpO2: 95%  Weight: 210 lb 11.2 oz (95.6 kg)  Height: 6' (1.829 m)   Body mass index is 28.58 kg/m.   General:  WDWN in NAD; vital signs documented above Gait: Normal HENT: WNL, normocephalic Pulmonary: normal non-labored breathing , without wheezing Cardiac: regular HR, without  Murmur; without carotid bruits Abdomen: soft, NT, no masses; aortic pulse is not palpable Skin: without rashes Vascular Exam/Pulses:  Right Left  Radial 2+ (normal) 2+ (normal)  Femoral Unable to palpate due to body habitus Unable to palpate due to body habitus  Popliteal Unable to palpate Unable to palpate  AT monophasic Faint monophasic  PT biphasic Brisk monophasic  Peroneal monophasic Brisk triphasic   Extremities: without ischemic changes, without Gangrene , without cellulitis; without open wounds;  Musculoskeletal: no muscle wasting or atrophy  Neurologic: A&O X 3;  No focal weakness or paresthesias are detected Psychiatric:  The pt has Normal affect.   Non-Invasive Vascular Imaging:   ABI's/TBI's on 10/04/2020: Right:  0.83/0.52 - Great toe pressure: 66 Left:  0.96/0.60 - Great toe pressure: 75   Previous ABI's/TBI's on 09/30/2019: Right:  0.92/0.42 - Great toe pressure: 53 Left:  0.95/0.45 - Great toe pressure:  57    ASSESSMENT/PLAN:: 70 y.o. male here for follow up for  hx of rotational atherectomy right AT,rotational atherectomy right popliteal artery, anddrug coated balloon angioplasty of right popliteal  artery with 31mm impact by Dr. Donzetta Matters on 07-12-16 by for right lower extremity life-limiting claudication. Hepreviously underwent right superficial femoral artery to below-knee popliteal bypass with great saphenous vein by Dr. Kellie Simmering in 1997. His bypass graft occluded in 2008.   -pt doing well today without issues of claudication, rest pain or non healing wounds.  His ABI on the right is slightly decreased from last visit, but TBI has increased.  -when he returns in one year, will get RLE arterial duplex given hx of atherectomy and angioplasty.   -pt has been diagnosed with DM since last visit-currently diet controlled.  Discussed walking at least 30 minutes per day and this would also help with weight loss, which would also help with his DM.   Discussed neuropathy with pt and to protect his feet.  -he will continue his statin/asa -he will call us sooner if has any issues before his next visit.     Leontine Locket, [facility] [phone]  Clinic MD:   Donzetta Matters

## 2020-10-12 ENCOUNTER — Telehealth: Payer: Medicare Other | Admitting: Adult Health

## 2020-10-12 NOTE — Progress Notes (Deleted)
PATIENT: [name] DOB: [birth year]  REASON FOR VISIT: follow up HISTORY FROM: patient  Virtual Visit via Video Note  I connected with [name] on 10/12/20 at  2:30 PM EDT by a video enabled telemedicine application located remotely at [facility] and verified that I am speaking with the correct person using two identifiers who was located at their own home.   I discussed the limitations of evaluation and management by telemedicine and the availability of in person appointments. The patient expressed understanding and agreed to proceed.   PATIENT: [name] DOB: [birth year]  REASON FOR VISIT: follow up HISTORY FROM: patient  HISTORY OF PRESENT ILLNESS: Today 10/12/20  HISTORY   REVIEW OF SYSTEMS: Out of a complete 14 system review of symptoms, the patient complains only of the following symptoms, and all other reviewed systems are negative.  ALLERGIES: Allergies  Allergen Reactions  . Clopidogrel Itching  . Sulfamethoxazole-Trimethoprim Other (See Comments)    Dizziness, flushed  . Codeine Other (See Comments)    Blood pressure drops  . Flexeril [Cyclobenzaprine]     sedation  . Morphine And Related Itching    HOME MEDICATIONS: Outpatient Medications Prior to Visit  Medication Sig Dispense Refill  . Adalimumab 40 MG/0.8ML PNKT Inject 40 mg into the skin every 14 (fourteen) days.    [name] amoxicillin (AMOXIL) 500 MG capsule TAKE 4 CAPSULES ONE HOUR BEFORE DENTAL APPOINTMENT. 8 capsule 0  . aspirin 81 MG tablet Take 1 tablet (81 mg total) by mouth daily.    [name] atorvastatin (LIPITOR) 80 MG tablet TAKE ONE TABLET AT BEDTIME. 90 tablet 3  . cilostazol (PLETAL) 100 MG tablet TAKE (1) TABLET TWICE A DAY BEFORE MEALS. 60 tablet 11  . citalopram (CELEXA) 40 MG tablet Take 40 mg by mouth daily at 12 noon.    . Cyanocobalamin (VITAMIN B 12) 100 MCG LOZG Take 1 tablet by mouth daily.    . fluticasone (FLONASE) 50 MCG/ACT nasal spray Place  1-2 sprays into both nostrils daily as needed for allergies or rhinitis.    . furosemide (LASIX) 20 MG tablet TAKE 1 TABLET ONCE DAILY. 30 tablet 11  . lansoprazole (PREVACID) 15 MG capsule Take 15 mg by mouth daily as needed. For acid reflux    . metoprolol tartrate (LOPRESSOR) 50 MG tablet TAKE (1/2) TABLET TWICE DAILY. 90 tablet 3  . Riboflavin (VITAMIN B-2 PO) Take 400 mg by mouth daily.    . sodium chloride (OCEAN) 0.65 % SOLN nasal spray Place 1 spray into both nostrils as needed for congestion.    . tadalafil (CIALIS) 20 MG tablet Take 20 mg by mouth daily as needed for erectile dysfunction.    . tamsulosin (FLOMAX) 0.4 MG CAPS capsule Take 0.4 mg by mouth daily.     . traMADol (ULTRAM) 50 MG tablet TAKE (1) TABLET TWICE A DAY AS NEEDED. 30 tablet 0   No facility-administered medications prior to visit.    PAST MEDICAL HISTORY: Past Medical History:  Diagnosis Date  . Aortic stenosis    a. severe bicuspid AV stenosis s/p pericardial tissue valve 2012.  . Arthritis   . Coronary artery disease    a. s/p CABG (LIMA-LCx) at time of AVR 2012, mild nonobstructive LAD and RCA stenoses.  . Depression   . Depression   . Diabetes mellitus without complication ([place])   . ED (erectile dysfunction)   . Fatty liver   . Femoral-popliteal bypass graft occlusion, right (  [facility]) 1997   Dr. Kellie Simmering  . GERD (gastroesophageal reflux disease)   . Gout   . H/O migraine    up to his 24s  . Headache(784.0)   . Hematuria    negative workup  . History of colonic polyps   . Hyperlipemia, mixed   . Hyperlipidemia   . Hypertension   . Irritable bowel syndrome 02/19/2020  . Migraines   . 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  . Obesity   . PAD (peripheral artery disease) ([place])   . PONV (postoperative nausea and vomiting)   . PVD (peripheral vascular disease) ([place])     PAST SURGICAL HISTORY: Past Surgical History:  Procedure Laterality Date  . AORTIC VALVE  REPLACEMENT  07/18/2011   Procedure: AORTIC VALVE REPLACEMENT (AVR);  Surgeon: Grace Isaac, MD;  Location: [facility];  Service: Open Heart Surgery;  Laterality: N/A;  . CARDIAC CATHETERIZATION  2009, 07/11/11   Dr. Irish Lack  . CORONARY ARTERY BYPASS GRAFT  07/18/2011   Procedure: CORONARY ARTERY BYPASS GRAFTING (CABG);  Surgeon: Grace Isaac, MD;  Location: [facility];  Service: Open Heart Surgery;  Laterality: N/A;  times one to mammary artery, left  . fem stent Right 2017  . FEMORAL ARTERY - POPLITEAL ARTERY BYPASS GRAFT    . KNEE ARTHROSCOPY     left  . MRI     to visualize aortic valve  . PERIPHERAL VASCULAR CATHETERIZATION N/A 07/12/2016   Procedure: Abdominal Aortogram w/Lower Extremity;  Surgeon: Waynetta Sandy, MD;  Location: [facility];  Service: Cardiovascular;  Laterality: N/A;  . PERIPHERAL VASCULAR CATHETERIZATION Right 07/12/2016   Procedure: Peripheral Vascular Atherectomy;  Surgeon: Waynetta Sandy, MD;  Location: [facility];  Service: Cardiovascular;  Laterality: Right;  Anterior Tibial and Popliteal  . ROTATOR CUFF REPAIR     Left  . US ECHOCARDIOGRAPHY      FAMILY HISTORY: Family History  Problem Relation Age of Onset  . Hypertension Father   . Diabetes Father   . Lung cancer Father   . Breast cancer Sister   . Healthy Brother   . Heart disease Maternal Grandmother   . Healthy Maternal Grandfather   . Cancer Maternal Grandfather   . Migraines Neg Hx   . Headache Neg Hx   . Colon cancer Neg Hx   . Pancreatic cancer Neg Hx   . Stomach cancer Neg Hx   . Esophageal cancer Neg Hx     SOCIAL HISTORY: Social History   Socioeconomic History  . Marital status: Divorced    Spouse name: Not on file  . Number of children: 4  . Years of education: Not on file  . Highest education level: Bachelor's degree (e.g., BA, AB, BS)  Occupational History  . Occupation: Horticulturist, commercial: Research scientist (life sciences)   Tobacco Use  . Smoking  status: Never Smoker  . Smokeless tobacco: Never Used  Vaping Use  . Vaping Use: Never used  Substance and Sexual Activity  . Alcohol use: No  . Drug use: No  . Sexual activity: Not on file  Other Topics Concern  . Not on file  Social History Narrative   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   Left handed   Caffeine: about 2 cups daily   Social Determinants of Health   Financial Resource Strain: Not on file  Food Insecurity: Not on file  Transportation Needs: Not  on file  Physical Activity: Not on file  Stress: Not on file  Social Connections: Not on file  Intimate Partner Violence: Not on file      PHYSICAL EXAM Generalized: Well developed, in no acute distress   Neurological examination  Mentation: Alert oriented to time, place, history taking. Follows all commands speech and language fluent Cranial nerve II-XII:Extraocular movements were full. Facial symmetry noted. uvula tongue midline. Head turning and shoulder shrug  were normal and symmetric. Motor: Good strength throughout subjectively per patient Sensory: Sensory testing is intact to soft touch on all 4 extremities subjectively per patient Coordination: Cerebellar testing reveals good finger-nose-finger  Gait and station: Patient is able to stand from a seated position. gait is normal.  Reflexes: UTA  DIAGNOSTIC DATA (LABS, IMAGING, TESTING) - I reviewed patient records, labs, notes, testing and imaging myself where available.  Lab Results  Component Value Date   WBC 7.3 04/14/2016   HGB 12.6 (L) 07/12/2016   HCT 37.0 (L) 07/12/2016   MCV 83.1 04/14/2016   PLT 290 04/14/2016      Component Value Date/Time   NA 139 12/15/2016 0857   K 4.3 12/15/2016 0857   CL 99 12/15/2016 0857   CO2 23 12/15/2016 0857   GLUCOSE 191 (H) 12/15/2016 0857   GLUCOSE 112 (H) 07/12/2016 0717   BUN 15 05/07/2020 1203   BUN 16 12/15/2016 0857   CREATININE 1.05 05/07/2020 1203   CREATININE 1.20 04/14/2016 1029   CALCIUM  9.1 12/15/2016 0857   PROT 6.6 05/12/2016 0854   ALBUMIN 3.9 05/12/2016 0854   AST 40 (H) 05/12/2016 0854   ALT 38 05/12/2016 0854   ALKPHOS 55 05/12/2016 0854   BILITOT 0.5 05/12/2016 0854   GFRNONAA 67 12/15/2016 0857   GFRAA 78 12/15/2016 0857   Lab Results  Component Value Date   CHOL 122 03/21/2018   HDL 42 03/21/2018   LDLCALC 52 03/21/2018   TRIG 138 03/21/2018   CHOLHDL 2.9 03/21/2018   Lab Results  Component Value Date   HGBA1C 5.8 (H) 07/14/2011   No results found for: VITAMINB12 Lab Results  Component Value Date   TSH 1.243 ***Test methodology is 3rd generation TSH*** 07/07/2008      ASSESSMENT AND PLAN 70 y.o. year old male  has a past medical history of Aortic stenosis, Arthritis, Coronary artery disease, Depression, Depression, Diabetes mellitus without complication ([place]), ED (erectile dysfunction), Fatty liver, Femoral-popliteal bypass graft occlusion, right ([place]) (1997), GERD (gastroesophageal reflux disease), Gout, H/O migraine, Headache(784.0), Hematuria, History of colonic polyps, Hyperlipemia, mixed, Hyperlipidemia, Hypertension, Irritable bowel syndrome (02/19/2020), Migraines, Near syncope, Obesity, PAD (peripheral artery disease) ([place]), PONV (postoperative nausea and vomiting), and PVD (peripheral vascular disease) ([place]). here with:  OSA on CPAP  . CPAP compliance excellent . Residual AHI is good . Encouraged patient to continue using CPAP nightly and > 4 hours each night . F/U in 1 year or sooner if needed  I spent 20 minutes of face-to-face and non-face-to-face time with patient.  This included previsit chart review, lab review, study review, order entry, electronic health record documentation, patient education.  Ward Givens, MSN, NP-C 10/12/2020, 11:19 AM [facility] [address], [place], [place] [address] [phone]

## 2020-10-20 DIAGNOSIS — L821 Other seborrheic keratosis: Secondary | ICD-10-CM | POA: Diagnosis not present

## 2020-10-20 DIAGNOSIS — L814 Other melanin hyperpigmentation: Secondary | ICD-10-CM | POA: Diagnosis not present

## 2020-10-20 DIAGNOSIS — L819 Disorder of pigmentation, unspecified: Secondary | ICD-10-CM | POA: Diagnosis not present

## 2020-10-20 DIAGNOSIS — D229 Melanocytic nevi, unspecified: Secondary | ICD-10-CM | POA: Diagnosis not present

## 2020-10-20 DIAGNOSIS — L570 Actinic keratosis: Secondary | ICD-10-CM | POA: Diagnosis not present

## 2020-10-29 ENCOUNTER — Other Ambulatory Visit: Payer: Self-pay | Admitting: Physician Assistant

## 2020-10-29 ENCOUNTER — Other Ambulatory Visit: Payer: Self-pay | Admitting: Cardiovascular Disease

## 2020-10-29 DIAGNOSIS — M542 Cervicalgia: Secondary | ICD-10-CM | POA: Diagnosis not present

## 2020-10-29 DIAGNOSIS — R202 Paresthesia of skin: Secondary | ICD-10-CM | POA: Diagnosis not present

## 2020-10-29 DIAGNOSIS — M5412 Radiculopathy, cervical region: Secondary | ICD-10-CM | POA: Diagnosis not present

## 2020-10-29 NOTE — Telephone Encounter (Signed)
Outpatient Medication Detail   Disp Refills Start End   atorvastatin (LIPITOR) 80 MG tablet 90 tablet 3 06/07/2020    Sig: TAKE ONE TABLET AT BEDTIME.   Sent to pharmacy as: atorvastatin (LIPITOR) 80 MG tablet   E-Prescribing Status: Receipt confirmed by pharmacy (06/07/2020  4:52 PM EST)     Pharmacy  [place], [address]

## 2020-11-02 ENCOUNTER — Other Ambulatory Visit: Payer: Self-pay | Admitting: Family Medicine

## 2020-11-02 DIAGNOSIS — M5412 Radiculopathy, cervical region: Secondary | ICD-10-CM

## 2020-11-02 DIAGNOSIS — R202 Paresthesia of skin: Secondary | ICD-10-CM

## 2020-11-02 DIAGNOSIS — M542 Cervicalgia: Secondary | ICD-10-CM

## 2020-11-03 ENCOUNTER — Other Ambulatory Visit: Payer: Self-pay | Admitting: Physician Assistant

## 2020-11-03 MED ORDER — TRAMADOL HCL 50 MG PO TABS: ORAL_TABLET | ORAL | 0 refills | Status: DC

## 2020-11-03 NOTE — Telephone Encounter (Signed)
Pt called regarding the status of his medication refill for tramadol

## 2020-11-03 NOTE — Telephone Encounter (Signed)
Sent in

## 2020-11-04 ENCOUNTER — Telehealth: Payer: Self-pay

## 2020-11-04 ENCOUNTER — Encounter: Payer: Self-pay | Admitting: Cardiovascular Disease

## 2020-11-04 DIAGNOSIS — I359 Nonrheumatic aortic valve disorder, unspecified: Secondary | ICD-10-CM

## 2020-11-04 NOTE — Telephone Encounter (Signed)
Called to arrange 1 year echo and visit with Dr. Burt Knack (not same day, due 03/29/21).  Left message to call back.

## 2020-11-04 NOTE — Telephone Encounter (Signed)
This encounter was created in error - please disregard.

## 2020-11-04 NOTE — Telephone Encounter (Signed)
Scheduled the patient for yearly echo 03/28/21 and visit with Dr. Burt Knack 03/30/21. He was grateful for call and agrees with plan.

## 2020-11-04 NOTE — Telephone Encounter (Signed)
Pt returning call to Harland German. Please advise

## 2020-11-11 DIAGNOSIS — M542 Cervicalgia: Secondary | ICD-10-CM | POA: Diagnosis not present

## 2020-11-11 DIAGNOSIS — M5412 Radiculopathy, cervical region: Secondary | ICD-10-CM | POA: Diagnosis not present

## 2020-11-11 DIAGNOSIS — R202 Paresthesia of skin: Secondary | ICD-10-CM | POA: Diagnosis not present

## 2020-11-11 DIAGNOSIS — H6123 Impacted cerumen, bilateral: Secondary | ICD-10-CM | POA: Diagnosis not present

## 2020-11-23 ENCOUNTER — Ambulatory Visit
Admission: RE | Admit: 2020-11-23 | Discharge: 2020-11-23 | Disposition: A | Discharge status: Home / Self Care | Payer: BC Managed Care – PPO | Source: Ambulatory Visit | Attending: Family Medicine | Admitting: Family Medicine

## 2020-11-23 ENCOUNTER — Other Ambulatory Visit: Payer: Self-pay

## 2020-11-23 DIAGNOSIS — M5412 Radiculopathy, cervical region: Secondary | ICD-10-CM

## 2020-11-23 DIAGNOSIS — M542 Cervicalgia: Secondary | ICD-10-CM

## 2020-11-23 DIAGNOSIS — M4802 Spinal stenosis, cervical region: Secondary | ICD-10-CM | POA: Diagnosis not present

## 2020-11-23 DIAGNOSIS — R202 Paresthesia of skin: Secondary | ICD-10-CM

## 2020-11-24 DIAGNOSIS — J069 Acute upper respiratory infection, unspecified: Secondary | ICD-10-CM | POA: Diagnosis not present

## 2020-11-24 DIAGNOSIS — M5000 Cervical disc disorder with myelopathy, unspecified cervical region: Secondary | ICD-10-CM | POA: Diagnosis not present

## 2020-11-26 DIAGNOSIS — J069 Acute upper respiratory infection, unspecified: Secondary | ICD-10-CM | POA: Diagnosis not present

## 2020-11-26 DIAGNOSIS — Z03818 Encounter for observation for suspected exposure to other biological agents ruled out: Secondary | ICD-10-CM | POA: Diagnosis not present

## 2020-11-29 ENCOUNTER — Other Ambulatory Visit: Payer: Self-pay | Admitting: Physician Assistant

## 2020-11-30 DIAGNOSIS — I2510 Atherosclerotic heart disease of native coronary artery without angina pectoris: Secondary | ICD-10-CM | POA: Diagnosis not present

## 2020-11-30 DIAGNOSIS — E118 Type 2 diabetes mellitus with unspecified complications: Secondary | ICD-10-CM | POA: Diagnosis not present

## 2020-11-30 DIAGNOSIS — F324 Major depressive disorder, single episode, in partial remission: Secondary | ICD-10-CM | POA: Diagnosis not present

## 2020-11-30 DIAGNOSIS — Z6829 Body mass index (BMI) 29.0-29.9, adult: Secondary | ICD-10-CM | POA: Diagnosis not present

## 2020-11-30 DIAGNOSIS — R030 Elevated blood-pressure reading, without diagnosis of hypertension: Secondary | ICD-10-CM | POA: Diagnosis not present

## 2020-11-30 DIAGNOSIS — M4712 Other spondylosis with myelopathy, cervical region: Secondary | ICD-10-CM | POA: Diagnosis not present

## 2020-11-30 DIAGNOSIS — I10 Essential (primary) hypertension: Secondary | ICD-10-CM | POA: Diagnosis not present

## 2020-11-30 DIAGNOSIS — E782 Mixed hyperlipidemia: Secondary | ICD-10-CM | POA: Diagnosis not present

## 2020-12-03 ENCOUNTER — Telehealth: Payer: Self-pay | Admitting: Physician Assistant

## 2020-12-03 NOTE — Telephone Encounter (Signed)
Pharmacy called. Requesting refill for Tramadol for the paitient.

## 2020-12-06 NOTE — Telephone Encounter (Signed)
If yes, please send into pharm.  

## 2020-12-07 ENCOUNTER — Telehealth: Payer: Self-pay | Admitting: Gastroenterology

## 2020-12-07 NOTE — Telephone Encounter (Signed)
Left message on voicemail for patient to return call. Will continue efforts 

## 2020-12-07 NOTE — Telephone Encounter (Signed)
    He dropped off an MRI report from May 2021 as well as a letter from his previous, [facility], gastroenterologist Dr. Alessandra Bevels telling him that he needed a repeat MRI around now.   Can you please let him know that he actually does not need that MRI now.  He is in our reminder system for a repeat MRI October 2022 and he does not need any looks at his pancreas sooner than that.

## 2020-12-09 NOTE — Telephone Encounter (Signed)
Left message for patient to return call to patient.  Will continue efforts.

## 2020-12-10 NOTE — Telephone Encounter (Signed)
Left message for patient with Dr Ardis Hughs recommendation that he does not need MRI of pancreas at this time.  Advised patient that we will contact him in October 2022 to schedule MRI.  Staff message sent to myself as a reminder.  Information also sent to patient in [place].  Advised patient to return call with any questions or concerns.

## 2020-12-16 DIAGNOSIS — J0101 Acute recurrent maxillary sinusitis: Secondary | ICD-10-CM | POA: Diagnosis not present

## 2021-01-10 DIAGNOSIS — G4733 Obstructive sleep apnea (adult) (pediatric): Secondary | ICD-10-CM

## 2021-01-10 DIAGNOSIS — G43719 Chronic migraine without aura, intractable, without status migrainosus: Secondary | ICD-10-CM | POA: Diagnosis not present

## 2021-01-10 DIAGNOSIS — G43909 Migraine, unspecified, not intractable, without status migrainosus: Secondary | ICD-10-CM | POA: Insufficient documentation

## 2021-01-10 HISTORY — DX: Obstructive sleep apnea (adult) (pediatric): G47.33

## 2021-01-20 ENCOUNTER — Telehealth: Payer: Self-pay | Admitting: Cardiovascular Disease

## 2021-01-20 MED ORDER — METOPROLOL TARTRATE 50 MG PO TABS: ORAL_TABLET | ORAL | 0 refills | Status: DC

## 2021-01-20 NOTE — Telephone Encounter (Signed)
*  STAT* If patient is at the pharmacy, call can be transferred to refill team.   1. Which medications need to be refilled? (please list name of each medication and dose if known)  metoprolol tartrate (LOPRESSOR) 50 MG tablet  2. Which pharmacy/location (including street and city if local pharmacy) is medication to be sent to? [place], [address]  3. Do they need a 30 day or 90 day supply?  90 day supply  Patient states he misplaced his Metoprolol bottle yesterday. He states yesterday he took his 1/2 tablet in the morning, but by the evening he had no idea where his bottle was and now he is completely out.

## 2021-01-20 NOTE — Telephone Encounter (Signed)
Pt's medication was sent to pt's pharmacy as requested. Confirmation received.  °

## 2021-01-28 DIAGNOSIS — M79675 Pain in left toe(s): Secondary | ICD-10-CM | POA: Diagnosis not present

## 2021-01-28 DIAGNOSIS — M10072 Idiopathic gout, left ankle and foot: Secondary | ICD-10-CM | POA: Diagnosis not present

## 2021-01-28 DIAGNOSIS — E119 Type 2 diabetes mellitus without complications: Secondary | ICD-10-CM | POA: Diagnosis not present

## 2021-02-03 DIAGNOSIS — L4050 Arthropathic psoriasis, unspecified: Secondary | ICD-10-CM | POA: Diagnosis not present

## 2021-02-03 DIAGNOSIS — Z79899 Other long term (current) drug therapy: Secondary | ICD-10-CM | POA: Diagnosis not present

## 2021-02-16 ENCOUNTER — Encounter: Payer: Self-pay | Admitting: Neurology

## 2021-02-16 ENCOUNTER — Ambulatory Visit (INDEPENDENT_AMBULATORY_CARE_PROVIDER_SITE_OTHER): Payer: BC Managed Care – PPO | Admitting: Neurology

## 2021-02-16 ENCOUNTER — Other Ambulatory Visit: Payer: Self-pay

## 2021-02-16 VITALS — BP 124/85 | HR 93 | Ht 72.0 in | Wt 214.4 lb

## 2021-02-16 DIAGNOSIS — G4484 Primary exertional headache: Secondary | ICD-10-CM | POA: Diagnosis not present

## 2021-02-16 DIAGNOSIS — I779 Disorder of arteries and arterioles, unspecified: Secondary | ICD-10-CM

## 2021-02-16 DIAGNOSIS — G43709 Chronic migraine without aura, not intractable, without status migrainosus: Secondary | ICD-10-CM | POA: Diagnosis not present

## 2021-02-16 DIAGNOSIS — H539 Unspecified visual disturbance: Secondary | ICD-10-CM

## 2021-02-16 DIAGNOSIS — H93A9 Pulsatile tinnitus, unspecified ear: Secondary | ICD-10-CM

## 2021-02-16 DIAGNOSIS — R519 Headache, unspecified: Secondary | ICD-10-CM | POA: Diagnosis not present

## 2021-02-16 DIAGNOSIS — R51 Headache with orthostatic component, not elsewhere classified: Secondary | ICD-10-CM

## 2021-02-16 MED ORDER — NURTEC 75 MG PO TBDP: 75.0000 mg | ORAL_TABLET | Freq: Every day | ORAL | 0 refills | Status: DC | PRN

## 2021-02-16 MED ORDER — AJOVY 225 MG/1.5ML ~~LOC~~ SOAJ: 225.0000 mg | SUBCUTANEOUS | 0 refills | Status: DC

## 2021-02-16 NOTE — Patient Instructions (Signed)
Stop tylenol Start Ajovy Try Nurtec Discussed Inspire, gave literature  Analgesic Rebound Headache An analgesic rebound headache, sometimes called a medication overuse headache or a drug-induced headache, is a secondary disorder that is caused by the overuse of pain medicine (analgesic) to treat the original (primary) headache. Any type of primary headache can return as a rebound headache if aperson regularly takes analgesics. The types of primary headaches that are commonly associated with rebound headaches include: Migraines. Headaches that are caused by tense muscles in the head and neck area (tension headaches). Headaches that develop and happen again on one side of the head and around the eye (cluster headaches). If rebound headaches continue, they can become long-term, daily headaches. What are the causes? This condition may be caused by frequent use of: Over-the-counter medicines such as aspirin, ibuprofen, and acetaminophen. Sinus-relief medicines and medicines that contain caffeine. Narcotic pain medicines such as codeine and oxycodone. Some prescription migraine medicines. What are the signs or symptoms? The symptoms of a rebound headache are the same as the symptoms of the originalheadache. Some of the symptoms of specific types of headaches include: Migraine headache Pulsing or throbbing pain on one or both sides of the head. Severe pain that interferes with daily activities. Pain that gets worse with physical activity. Nausea, vomiting, or both. Pain and sensitivity with exposure to bright light, loud noises, or strong smells. Visual changes. Numbness of one or both arms. Tension headache Pressure around the head. Dull, aching head pain. Pain felt over the front and sides of the head. Tenderness in the muscles of the head, neck, and shoulders. Cluster headache Severe pain that begins in or around one eye or temple. Droopy or swollen eyelid, or redness and tearing in  the eye on the same side as the pain. One-sided head pain. Nausea. Runny nose. Sweaty, pale facial skin. Restlessness. How is this diagnosed? This condition is diagnosed by: Reviewing your medical history. This includes the nature of your primary headaches. Reviewing the types of pain medicines that you have been using to treat your primary headaches and how often you take them. How is this treated? This condition may be treated or managed by: Discontinuing frequent use of the analgesic medicine. Doing this may worsen your headaches at first, but the pain should eventually become more manageable, less frequent, and less severe. Seeing a headache specialist. He or she may be able to help you manage your headaches and help make sure there is not another cause of the headaches. Using methods of stress relief, such as acupuncture, counseling, biofeedback, and massage. Talk with your health care provider about which methods might be good for you. Follow these instructions at home: Medicines  Take over-the-counter and prescription medicines only as told by your health care provider. Stop the repeated use of pain medicine as told by your health care provider. Stopping can be difficult. Carefully follow instructions from your health care provider.  Lifestyle  Follow a regular sleep schedule. Do not vary the time that you go to bed or the amount that you sleep from day to day. It is important to stay on the same schedule to help prevent headaches. Get 7-9 hours of sleep each night, or the amount recommended by your health care provider. Exercise regularly. Exercise for at least 30 minutes, 5 times each week. Limit or manage stress. Consider stress-relief options such as acupuncture, counseling, biofeedback, and massage. Talk with your health care provider about which methods might be good for you. Do not  drink alcohol. Do not use any products that contain nicotine or tobacco, such as cigarettes,  e-cigarettes, and chewing tobacco. If you need help quitting, ask your health care provider.  General instructions Avoid triggers that are known to cause your primary headaches. Keep all follow-up visits as told by your health care provider. This is important. Contact a health care provider if: You continue to experience headaches after following treatments that your health care provider recommended. Get help right away if you have: New headache pain. Headache pain that is different than what you have experienced in the past. Numbness or tingling in your arms or legs. Changes in your speech or vision. Summary An analgesic rebound headache, sometimes called a medication overuse headache or a drug-induced headache, is caused by the overuse of pain medicine (analgesic) to treat the original (primary) headache. Any type of primary headache can return as a rebound headache if a person regularly takes analgesics. The types of primary headaches that are commonly associated with rebound headaches include migraines, tension headaches, and cluster headaches. Analgesic rebound headaches can occur with frequent use of over-the-counter medicines and prescription medicines. Treatment involves stopping the medicine that is being overused. This will improve headache frequency and severity. This information is not intended to replace advice given to you by your health care provider. Make sure you discuss any questions you have with your healthcare provider. Document Revised: 08/14/2019 Document Reviewed: 08/14/2019 Elsevier Patient Education  2022 [place] oral dissolving tablet What is this medication? RIMEGEPANT (ri ME je pant) is used to treat migraine headaches with or without aura. An aura is a strange feeling or visual disturbance that warns you of anattack. It is also used to prevent migraine headaches. This medicine may be used for other purposes; ask your health care provider  orpharmacist if you have questions. COMMON BRAND NAME(S): NURTEC ODT What should I tell my care team before I take this medication? They need to know if you have any of these conditions: kidney disease liver disease an unusual or allergic reaction to rimegepant, other medicines, foods, dyes, or preservatives pregnant or trying to get pregnant breast-feeding How should I use this medication? Take the medicine by mouth. Follow the directions on the prescription label. Leave the tablet in the sealed blister pack until you are ready to take it. With dry hands, open the blister and gently remove the tablet. If the tablet breaks or crumbles, throw it away and take a new tablet out of the blister pack. Place the tablet in the mouth and allow it to dissolve, and then swallow. Do not cut, crush, or chew this medicine. You do not need water to take thismedicine. Talk to your pediatrician about the use of this medicine in children. Specialcare may be needed. Overdosage: If you think you have taken too much of this medicine contact apoison control center or emergency room at once. NOTE: This medicine is only for you. Do not share this medicine with others. What if I miss a dose? This does not apply. This medicine is not for regular use. What may interact with this medication? This medicine may interact with the following medications: certain medicines for fungal infections like fluconazole, itraconazole rifampin This list may not describe all possible interactions. Give your health care provider a list of all the medicines, herbs, non-prescription drugs, or dietary supplements you use. Also tell them if you smoke, drink alcohol, or use illegaldrugs. Some items may interact with your medicine. What should  I watch for while using this medication? Visit your health care professional for regular checks on your progress. Tell your health care professional if your symptoms do not start to get better or ifthey  get worse. What side effects may I notice from receiving this medication? Side effects that you should report to your doctor or health care professionalas soon as possible: allergic reactions like skin rash, itching or hives; swelling of the face, lips, or tongue Side effects that usually do not require medical attention (report these toyour doctor or health care professional if they continue or are bothersome): nausea This list may not describe all possible side effects. Call your doctor for medical advice about side effects. You may report side effects to FDA at1-800-FDA-1088. Where should I keep my medication? Keep out of the reach of children and pets. Store at room temperature between 20 and 25 degrees C (68 and 77 degrees F).Get rid of any unused medicine after the expiration date. To get rid of medicines that are no longer needed or have expired: Take the medicine to a medicine take-back program. Check with your pharmacy or law enforcement to find a location. If you cannot return the medicine, check the label or package insert to see if the medicine should be thrown out in the garbage or flushed down the toilet. If you are not sure, ask your health care provider. If it is safe to put it in the trash, take the medicine out of the container. Mix the medicine with cat litter, dirt, coffee grounds, or other unwanted substance. Seal the mixture in a bag or container. Put it in the trash. NOTE: This sheet is a summary. It may not cover all possible information. If you have questions about this medicine, talk to your doctor, pharmacist, orhealth care provider.  2022 Elsevier/Gold Standard (2019-12-30 17:56:55) Rolanda Lundborg injection What is this medication? FREMANEZUMAB (fre ma NEZ ue mab) is used to prevent migraine headaches. This medicine may be used for other purposes; ask your health care provider orpharmacist if you have questions. COMMON BRAND NAME(S): AJOVY What should I tell my care  team before I take this medication? They need to know if you have any of these conditions: an unusual or allergic reaction to fremanezumab, other medicines, foods, dyes, or preservatives pregnant or trying to get pregnant breast-feeding How should I use this medication? This medicine is for injection under the skin. You will be taught how to prepare and give this medicine. Use exactly as directed. Take your medicine atregular intervals. Do not take your medicine more often than directed. It is important that you put your used needles and syringes in a special sharps container. Do not put them in a trash can. If you do not have a sharpscontainer, call your pharmacist or healthcare provider to get one. Talk to your pediatrician regarding the use of this medicine in children.Special care may be needed. Overdosage: If you think you have taken too much of this medicine contact apoison control center or emergency room at once. NOTE: This medicine is only for you. Do not share this medicine with others. What if I miss a dose? If you miss a dose, take it as soon as you can. If it is almost time for yournext dose, take only that dose. Do not take double or extra doses. What may interact with this medication? Interactions are not expected. This list may not describe all possible interactions. Give your health care provider a list of all the medicines, herbs,  non-prescription drugs, or dietary supplements you use. Also tell them if you smoke, drink alcohol, or use illegaldrugs. Some items may interact with your medicine. What should I watch for while using this medication? Tell your doctor or healthcare professional if your symptoms do not start toget better or if they get worse. What side effects may I notice from receiving this medication? Side effects that you should report to your doctor or health care professionalas soon as possible: allergic reactions like skin rash, itching or hives, swelling of the  face, lips, or tongue Side effects that usually do not require medical attention (report these toyour doctor or health care professional if they continue or are bothersome): pain, redness, or irritation at site where injected This list may not describe all possible side effects. Call your doctor for medical advice about side effects. You may report side effects to FDA at1-800-FDA-1088. Where should I keep my medication? Keep out of the reach of children. You will be instructed on how to store this medicine. Throw away any unusedmedicine after the expiration date on the label. NOTE: This sheet is a summary. It may not cover all possible information. If you have questions about this medicine, talk to your doctor, pharmacist, orhealth care provider.  2022 Elsevier/Gold Standard (2017-04-16 17:22:56)

## 2021-02-16 NOTE — Progress Notes (Signed)
[facility]    Provider:  Dr Jaynee Eagles Requesting Provider: Izora Gala, MD Primary Care Provider:  Caren Macadam, MD  CC:  migraines  HPI:  [name] is a 70 y.o. male here as requested by Izora Gala, MD for migraines.  Past medical history of open heart surgery, obstructive sleep apnea unknown compliance, diabetes, migraines, headache, hypertension.  He is here again for chronic headaches behind the left eye. When he sneezes or cough it hurts. Saw him initially in 2020 and ordered MRI brain and sleep study, when we found OSA, he has been compliant, the headaches are always there 24x7, always behind the left eye, cpap may have helped the nocturnal and morning headaches but he still has daily chronic headaches. Very bad after he sneezes. When he has the headaches, he can have nausea, he has been taking daily tylenol but he knows about medication rebound, he has a history of migraines. Does not get worse standing for long periods, not positional in nature but it is exertional, when he lays down on his right  it helps, always on the left, pressure, pulsating, pounding, he as some vision changes but doesn't have auras anymore like used. Seems to always be in the left eye.   Medications tried: metoprolol, depo-medrol, nortriptyline, topamax, amitriptyline,imitrex,maxalt  MRI brain 10/11/2018: The brain parenchyma shows only minor age-appropriate changes of chronic microvascular ischemia. No structural lesion, tumor infarcts are noted. No abnormal lesions are seen on diffusion-weighted views to suggest acute ischemia. The cortical sulci, fissures and cisterns are normal in size and appearance. Lateral, third and fourth ventricle are normal in size and appearance. No extra-axial fluid collections are seen. No evidence of mass effect or midline shift.  No abnormal lesions are seen on post contrast views.  On sagittal views the posterior fossa, pituitary gland and corpus callosum  are unremarkable. No evidence of intracranial hemorrhage on gradient-echo views. The orbits and their contents, paranasal sinuses and calvarium are unremarkable.  Intracranial flow voids are present.       IMPRESSION:  Unremarkable MRI scan of the brain with and without contrast. Personally reviewed and agreed  AHI 52 2020 tried cpap  I reviewed Dr. Janeice Robinson notes from ENT, patient was seen in June of this year, for the past 10 years after undergoing open heart surgery he has had chronic almost daily left frontal headaches, CT of the sinuses 2 years ago was completely negative, he does have a history of migraine when he was younger, they were migraines with aura and he no longer has the aura he has some GI symptoms associated with the headaches and also suffers with sleep apnea and was prescribed CPAP about a year ago.  Dr. Janeice Robinson general exam was normal, diagnosis not sinus in origin likely migraine related, and he was referred to Dr. Redmond Baseman for possible candidacy for inspire implant.  Reviewed notes, labs and imaging from outside physicians, which showed:  Labs July 2020 to include normal CBC, CMP with creatinine 1.12, BUN 18 otherwise unremarkable,  FINDINGS: MRI Cervical spine 10/2020 Alignment: Normal except for 1 mm of anterolisthesis at C7-T1.   Vertebrae: No fracture or primary bone lesion. See below regarding facet arthropathy on the right at C3-4.   Cord: No cord compression or primary cord lesion. See below regarding stenosis at C3-4.   Posterior Fossa, vertebral arteries, paraspinal tissues: Negative   Disc levels:   Foramen magnum is widely patent. C1-2 articulation shows mild osteoarthritis but no encroachment upon the  neural structures.   C2-3: Spondylosis with endplate osteophytes and protruding disc material more prominent towards the left. Narrowing of the ventral subarachnoid space but no compression of the cord. Foraminal narrowing on the left that could possibly  affect the left C3 nerve.   C3-4: Spondylosis with endplate osteophytes and shallow protrusion of the disc. Facet degeneration and hypertrophy, worse on the right, with edema on the right. There is canal narrowing with AP diameter in the midline measuring 7.7 mm. The subarachnoid space surrounding the cord is effaced but the cord is not frankly compressed. Bilateral foraminal stenosis could affect either C4 nerve. Edematous facet arthropathy on the right could certainly be symptomatic.   C4-5: Endplate osteophytes and bulging of the disc. Mild narrowing of the ventral subarachnoid space but no compressive narrowing of the canal. Mild facet hypertrophy. Mild bilateral foraminal narrowing, not grossly compressive.   C5-6: Spondylosis with uncovertebral hypertrophy on the left. Facet degeneration and hypertrophy on the left. No compressive canal stenosis. Left foraminal stenosis could compress the left C6 nerve.   C6-7: Left-sided predominant uncovertebral prominence and disc bulge. Mild left foraminal narrowing, not definitely compressive.   C7-T1: Facet osteoarthritis on the left allowing 1 mm of anterolisthesis. Left foraminal narrowing that could affect the left C8 nerve.   IMPRESSION: In this patient with right-sided symptoms, the most concerning findings are at the C3-4 level. There is spondylosis with endplate osteophytes and shallow protrusion of disc material resulting in canal narrowing with AP diameter in the midline of only 7.7 mm. The subarachnoid space is effaced but the cord is not frankly compressed. There is bilateral facet degeneration and hypertrophy, with edematous change on the right. There is bilateral foraminal stenosis that could affect either C4 nerve. Edematous facet arthropathy on the right could be a cause of right neck symptoms.   C2-3: Left-sided predominant spondylosis with left foraminal narrowing that could affect the left C3 nerve.   C4-5  spondylosis and facet hypertrophy with mild bilateral foraminal narrowing not definitely compressive.   C5-6: Left-sided predominant spondylosis and facet arthropathy with left foraminal narrowing that could affect the left C6 nerve.   C7-T1: Left-sided facet arthropathy at C7-T1 with 1 mm of anterolisthesis and left foraminal narrowing that could affect the left C8 nerve.  Review of Systems: Patient complains of symptoms per HPI as well as the following symptoms sleep apnea. Pertinent negatives and positives per HPI. All others negative.   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   Tobacco Use   Smoking status: Never   Smokeless tobacco: Never  Vaping Use   Vaping Use: Never used  Substance and Sexual Activity   Alcohol use: No   Drug use: No   Sexual activity: Not on file  Other Topics Concern   Not on file  Social History Narrative   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   Left handed   Caffeine: about 1 cups daily   Social Determinants of Health   Financial Resource Strain: Not on file  Food Insecurity: Not on file  Transportation Needs: Not on file  Physical Activity: Not on file  Stress: Not on file  Social Connections: Not on file  Intimate Partner Violence: Not on file    Family History  Problem Relation Age of Onset   Hypertension Father    Diabetes Father    Lung cancer Father    Breast cancer Sister    Healthy Brother    Heart disease Maternal Grandmother    Healthy Maternal Grandfather    Cancer Maternal Grandfather    Migraines Neg Hx    Headache Neg Hx    Colon cancer Neg Hx    Pancreatic cancer Neg Hx    Stomach cancer Neg Hx    Esophageal cancer Neg Hx     Past Medical History:   Diagnosis Date   Aortic stenosis    a. severe bicuspid AV stenosis s/p pericardial tissue valve 2012.   Arthritis    Coronary artery disease    a. s/p CABG (LIMA-LCx) at time of AVR 2012, mild nonobstructive LAD and RCA stenoses.   Depression    Depression    Diabetes mellitus without complication [facility])    ED (erectile dysfunction)    Fatty liver    Femoral-popliteal bypass graft occlusion, right [facility]) 1997   Dr. 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   Obesity    PAD (peripheral artery disease) (HCC)    PONV (postoperative nausea and vomiting)    PVD (peripheral vascular disease) ([place])     Patient Active Problem List   Diagnosis Date Noted   Renal stones 10/04/2020   Type 2 diabetes mellitus with unspecified complications ([place]) 76/28/3151   Benign prostatic hyperplasia without lower urinary tract symptoms 09/30/2020   GERD (gastroesophageal reflux disease) 05/05/2020   Irritable bowel syndrome 02/19/2020   Dependence on other enabling machines and devices 02/19/2020   ED (erectile dysfunction) of organic origin 02/19/2020   Major depression single episode, in partial remission ([place]) 02/19/2020   Obesity 02/19/2020   Chronic right-sided low back pain without sciatica 12/16/2019   Primary osteoarthritis of left knee 09/16/2019   Rotator cuff tear arthropathy of left shoulder 09/16/2019   Primary osteoarthritis of right knee 09/16/2019   Complex tear of medial meniscus of right knee as current injury 03/04/2019   Chronic diastolic heart failure ([place]) 11/25/2018   Bruxism (teeth grinding) 11/25/2018   Cephalalgia 11/25/2018   Cerebrovascular disease 11/25/2018    Morning headache 11/25/2018   Sleep related headaches 09/26/2018   Chronic idiopathic gout without tophus 02/13/2018   Hypertension    History of adenomatous polyp of colon 03/14/2016   Thrombosed hemorrhoids 03/01/2016   Encounter for long-term (current) use of high-risk medication 06/30/2015   Psoriasis 12/25/2014   Major depressive disorder, recurrent, in full remission ([place]) 06/22/2014   Gout 04/21/2014   Chest pain, somewhat atypical 12/28/2013   Dyspnea on exertion 12/27/2011   Dizziness 08/15/2011   Heart valve replaced by other means 07/28/2011   Aortic stenosis, severe 07/18/2011   Coronary artery disease 07/06/2011   Peripheral vascular disease ([place]) 07/06/2011   Hyperlipidemia 07/06/2011   Bicuspid aortic valve 07/06/2011    Past Surgical History:  Procedure Laterality Date   AORTIC  VALVE REPLACEMENT  07/18/2011   Procedure: AORTIC VALVE REPLACEMENT (AVR);  Surgeon: Delight Ovens, MD;  Location[facility];  Service: Open Heart Surgery;  Laterality: N/A;   CARDIAC CATHETERIZATION  2009, 07/11/11   Dr. Eldridge Dace   CORONARY ARTERY BYPASS GRAFT  07/18/2011   Procedure: CORONARY ARTERY BYPASS GRAFTING (CABG);  Surgeon: Delight Ovens, MD;  Location: [facility] OR;  Service: Open Heart Surgery;  Laterality: N/A;  times one to mammary artery, left   fem stent Right 2017   FEMORAL ARTERY - POPLITEAL ARTERY BYPASS GRAFT     KNEE ARTHROSCOPY     left   MRI     to visualize aortic valve   PERIPHERAL VASCULAR CATHETERIZATION N/A 07/12/2016   Procedure: Abdominal Aortogram w/Lower Extremity;  Surgeon: Maeola Harman, MD;  [facility]: [facility] INVASIVE CV LAB;  Service: Cardiovascular;  Laterality: N/A;   PERIPHERAL VASCULAR CATHETERIZATION Right 07/12/2016   Procedure: Peripheral Vascular Atherectomy;  Surgeon: Maeola Harman, MD;  Location: [facility] INVASIVE CV LAB;  Service: Cardiovascular;  Laterality: Right;  Anterior Tibial and Popliteal   ROTATOR CUFF REPAIR     Left    US ECHOCARDIOGRAPHY      Current Outpatient Medications  Medication Sig Dispense Refill   Adalimumab 40 MG/0.8ML PNKT Inject 40 mg into the skin every 14 (fourteen) days.     amoxicillin (AMOXIL) 500 MG capsule TAKE 4 CAPSULES ONE HOUR BEFORE DENTAL APPOINTMENT. 8 capsule 0   aspirin 81 MG tablet Take 1 tablet (81 mg total) by mouth daily.     atorvastatin (LIPITOR) 80 MG tablet TAKE ONE TABLET AT BEDTIME. 90 tablet 3   cilostazol (PLETAL) 100 MG tablet TAKE (1) TABLET TWICE A DAY BEFORE MEALS. 60 tablet 11   citalopram (CELEXA) 40 MG tablet Take 40 mg by mouth daily at 12 noon.     Cyanocobalamin (VITAMIN B 12) 100 MCG LOZG Take 1 tablet by mouth daily.     fluticasone (FLONASE) 50 MCG/ACT nasal spray Place 1-2 sprays into both nostrils daily as needed for allergies or rhinitis.     Fremanezumab-vfrm (AJOVY) 225 MG/1.5ML SOAJ Inject 225 mg into the skin every 30 (thirty) days. 6 mL 0   furosemide (LASIX) 20 MG tablet TAKE 1 TABLET ONCE DAILY. 30 tablet 11   lansoprazole (PREVACID) 15 MG capsule Take 15 mg by mouth daily as needed. For acid reflux     metoprolol tartrate (LOPRESSOR) 50 MG tablet TAKE (1/2) TABLET TWICE DAILY. Please keep upcoming appt in August 2022 with Dr. Excell Seltzer before anymore refills. Thank you 90 tablet 0   Riboflavin (VITAMIN B-2 PO) Take 400 mg by mouth daily.     Rimegepant Sulfate (NURTEC) 75 MG TBDP Take 75 mg by mouth daily as needed. For migraines. Take as close to onset of migraine as possible. One daily maximum. 4 tablet 0   sodium chloride (OCEAN) 0.65 % SOLN nasal spray Place 1 spray into both nostrils as needed for congestion.     tadalafil (CIALIS) 20 MG tablet Take 20 mg by mouth daily as needed for erectile dysfunction.     tamsulosin (FLOMAX) 0.4 MG CAPS capsule Take 0.4 mg by mouth daily.      traMADol (ULTRAM) 50 MG tablet TAKE (1) TABLET TWICE A DAY AS NEEDED. 30 tablet 0   No current facility-administered medications for this visit.    Allergies as  of 02/16/2021 - Review Complete 02/16/2021  Allergen Reaction Noted   Clopidogrel Itching 08/18/2016  Sulfamethoxazole-trimethoprim Other (See Comments) 09/26/2018   Codeine Other (See Comments) 07/06/2011   Flexeril [cyclobenzaprine]  09/26/2018   Morphine and related Itching 07/14/2011    Vitals: BP 124/85   Pulse 93   Ht 6' (1.829 m)   Wt 214 lb 6.4 oz (97.3 kg)   BMI 29.08 kg/m  Last Weight:  Wt Readings from Last 1 Encounters:  02/16/21 214 lb 6.4 oz (97.3 kg)   Last Height:   Ht Readings from Last 1 Encounters:  02/16/21 6' (1.829 m)   Exam: NAD, pleasant                  Speech:    Speech is normal; fluent and spontaneous with normal comprehension.  Cognition:    The patient is oriented to person, place, and time;     recent and remote memory intact;     language fluent;    Cranial Nerves:    The pupils are equal, round, and reactive to light.Trigeminal sensation is intact and the muscles of mastication are normal. The face is symmetric. The palate elevates in the midline. Hearing intact. Voice is normal. Shoulder shrug is normal. The tongue has normal motion without fasciculations.   Coordination:  No dysmetria  Motor Observation:    No asymmetry, no atrophy, and no involuntary movements noted. Tone:    Normal muscle tone.     Strength:    Strength is V/V in the upper and lower limbs.      Sensation: intact to LT     Assessment/Plan:  Chronic daily headaches due to migraines, medication overuse and likely untreated OSA (AHI 52)    As far as your medications are concerned, I would like to suggest: - Stop daily over the counter med use (Tylenol, excedrin, ibuprofen, alleve) Do not tak emore than 2-3x in one week - Try Ajovy (samples) - try Nurtec (samples) - headaches are positional and exertional: MRI brain/MRA head, blood work including esr/crp (TGA unlikely)   Discussed; To prevent or relieve headaches, try the following: Cool Compress. Lie down and  place a cool compress on your head.  Avoid headache triggers. If certain foods or odors seem to have triggered your migraines in the past, avoid them. A headache diary might help you identify triggers.  Include physical activity in your daily routine. Try a daily walk or other moderate aerobic exercise.  Manage stress. Find healthy ways to cope with the stressors, such as delegating tasks on your to-do list.  Practice relaxation techniques. Try deep breathing, yoga, massage and visualization.  Eat regularly. Eating regularly scheduled meals and maintaining a healthy diet might help prevent headaches. Also, drink plenty of fluids.  Follow a regular sleep schedule. Sleep deprivation might contribute to headaches Consider biofeedback. With this mind-body technique, you learn to control certain bodily functions -- such as muscle tension, heart rate and blood pressure -- to prevent headaches or reduce headache pain.      Proceed to emergency room if you experience new or worsening symptoms or symptoms do not resolve, if you have new neurologic symptoms or if headache is severe, or for any concerning symptom.      Orders Placed This Encounter  Procedures   MR BRAIN W WO CONTRAST   MR ANGIO HEAD WO CONTRAST   Comprehensive metabolic panel   CBC with Differential/Platelets   TSH   Sedimentation rate   C-reactive protein   Meds ordered this encounter  Medications   Fremanezumab-vfrm (AJOVY) 225 MG/1.5ML  SOAJ    Sig: Inject 225 mg into the skin every 30 (thirty) days.    Dispense:  6 mL    Refill:  0    Samples [id number]   Rimegepant Sulfate (NURTEC) 75 MG TBDP    Sig: Take 75 mg by mouth daily as needed. For migraines. Take as close to onset of migraine as possible. One daily maximum.    Dispense:  4 tablet    Refill:  0    [id number] 9/24    Cc: Izora Gala, MD,  Caren Macadam, MD  Sarina Ill, MD  [facility] [address] [place], [place]  [address]  Phone [phone] Fax [phone]  I spent over 40 minutes of face-to-face and non-face-to-face time with patient on the  1. Chronic daily headache   2. Chronic migraine w/o aura w/o status migrainosus, not intractable   3. Exertional headache   4. Vision changes   5. Left temporal headache   6. Pulsatile tinnitus   7. Positional headache    diagnosis.  This included previsit chart review, lab review, study review, order entry, electronic health record documentation, patient education on the different diagnostic and therapeutic options, counseling and coordination of care, risks and benefits of management, compliance, or risk factor reduction

## 2021-02-21 ENCOUNTER — Encounter: Payer: Self-pay | Admitting: *Deleted

## 2021-03-01 ENCOUNTER — Telehealth: Payer: Self-pay | Admitting: Neurology

## 2021-03-01 DIAGNOSIS — M4722 Other spondylosis with radiculopathy, cervical region: Secondary | ICD-10-CM | POA: Diagnosis not present

## 2021-03-01 NOTE — Telephone Encounter (Signed)
Pt called, would like a call from nurse to make sure administering Fremanezumab-vfrm (AJOVY) 225 MG/1.5ML SOAJ correctly.

## 2021-03-02 ENCOUNTER — Telehealth: Payer: Self-pay

## 2021-03-02 NOTE — Telephone Encounter (Signed)
PA request for Ajovy submitted on CMM, Key: [id number] - PA Case ID: [id number].   Awaiting determination.

## 2021-03-02 NOTE — Telephone Encounter (Signed)
Spoke with the patient.  He stated he figured out how to inject the Ajovy.  It is a little different administration than his EpiPen.  He does have the instructions however and verbalized appreciation for my call.

## 2021-03-07 ENCOUNTER — Encounter: Payer: Self-pay | Admitting: Neurology

## 2021-03-07 ENCOUNTER — Telehealth: Payer: Self-pay | Admitting: Neurology

## 2021-03-07 ENCOUNTER — Ambulatory Visit (INDEPENDENT_AMBULATORY_CARE_PROVIDER_SITE_OTHER): Payer: BC Managed Care – PPO | Admitting: Neurology

## 2021-03-07 VITALS — BP 100/67 | HR 72 | Ht 72.0 in | Wt 207.0 lb

## 2021-03-07 DIAGNOSIS — R519 Headache, unspecified: Secondary | ICD-10-CM | POA: Diagnosis not present

## 2021-03-07 DIAGNOSIS — M542 Cervicalgia: Secondary | ICD-10-CM

## 2021-03-07 DIAGNOSIS — G4489 Other headache syndrome: Secondary | ICD-10-CM

## 2021-03-07 DIAGNOSIS — I779 Disorder of arteries and arterioles, unspecified: Secondary | ICD-10-CM

## 2021-03-07 NOTE — Patient Instructions (Signed)
Cervicogenic Headache  A cervicogenic headache is a headache caused by a condition that affects the bones and tissues in your neck (cervical spine). In a cervicogenic headache, the pain moves from your neck to your head. Most cervicogenic headaches start in the upper part of the neck with the first three cervical bones (cervical vertebrae). A cervicogenic headache is diagnosed when a cause can be found in the cervicalspine and other causes of headaches can be ruled out. What are the causes? The most common cause of this condition is a traumatic injury to the cervicalspine, such as whiplash. Other causes include: Arthritis. Broken bone (fracture). Infection. Tumor. What are the signs or symptoms? The most common symptoms are neck and head pain. The pain is often located on one side. In some cases, there may be head pain without neck pain. Pain may befelt in the neck, back or side of the head, face, or behind the eyes. Other symptoms include: Limited movement in the neck. Arm or shoulder pain. How is this diagnosed? This condition may be diagnosed based on: Your symptoms. A physical exam. An injection that blocks nerve signals (nerve block). Imaging tests, such as: X-rays. CT scan. MRI. How is this treated? Treatment for this condition may depend on the underlying condition. Treatment may include: Medicines, such as: NSAIDs. Muscle relaxants. Physical therapy. Massage therapy. Complementary therapies, such as: Biofeedback. Meditation. Acupuncture. Nerve block injections. Botulinum toxin injections. Your treatment plan may involve working with a pain management team that includes your primary health care provider, a pain management specialist, aneurologist, and a physical therapist. Follow these instructions at home: Take over-the-counter and prescription medicines only as told by your health care provider. Do exercises at home as told by your physical therapist. Return to your  normal activities as told by your health care provider. Ask your health care provider what activities are safe for you. Avoid activities that trigger your headaches. Maintain good neck support and posture at home and at work. Keep all follow-up visits as told by your health care provider. This is important. Contact a health care provider if you have: Headaches that are getting worse and happening more often. Headaches with any of the following: Fever. Numbness. Weakness. Dizziness. Nausea or vomiting. Get help right away if: You have a very sudden and severe headache. Summary A cervicogenic headache is a headache caused by a condition that affects the bones and tissues in your cervical spine. Your health care provider may diagnose this condition with a physical exam, a nerve block, and imaging tests. Treatment may include medicine to reduce pain and inflammation, physical therapy, and nerve block injections. Complementary therapies, such as acupuncture and meditation, may be added to other treatments. Your treatment plan may involve working with a pain management team that includes your primary health care provider, a pain management specialist, a neurologist, and a physical therapist. This information is not intended to replace advice given to you by your health care provider. Make sure you discuss any questions you have with your healthcare provider. Document Revised: 05/27/2020 Document Reviewed: 05/27/2020 Elsevier Patient Education  2022 [employer].

## 2021-03-07 NOTE — Telephone Encounter (Signed)
Patient said that Dr. Brett Fairy is going to send him back to Dr. Jaynee Eagles for a nerve block. Please advise.

## 2021-03-07 NOTE — Progress Notes (Signed)
SLEEP MEDICINE CLINIC    Provider:  Larey Seat, MD  Primary Care Physician:  Caren Macadam, [place]  [id number]     Referring Provider: Dr Jaynee Eagles, MD      Chief Complaint according to patient   Patient presents with:     New Patient (Initial Visit)           HISTORY OF PRESENT ILLNESS:  [name] is a 70 y.o. year old White or Caucasian male patient seen here as a referral by Dr Renaldo Fiddler 03/07/2021. Chief concern according to patient :  left facial dyseasthesia/  [name] has been an established patient of Dr. Berta Minor Ahern's whom he last saw on [phone].  He was originally referred by Dr. Dellis Filbert Rosen's ear nose and throat specialist for evaluation of migraines.  His past medical history is positive for open heart surgery aortic valve replacement.  Obstructive sleep apnea, diabetes mellitus, type II uncomplicated, chronic migraines and other headaches, history of hypertension.  The patient had repeatedly reported chronic headaches that seem to cluster behind the left eye whenever he had a high intrathoracic pressure for example sneezing or coughing the pain is exacerbated.  In 2020 the patient had an MRI brain ordered which returned as normal and a sleep study which we performed in form of a home sleep test on December 30, 2018.  The overall AHI was 52.7 which indicated a very severe form of obstructive sleep apnea and there was tachybradycardia.  He was also snoring according to the vibration detector on his chest.  I started him on CPAP at the time he had been referred through a [facility] related visit and order and I ordered auto CPAP the settings were 5 through 18 cmH2O with 3 cm EPR.  The patient has used CPAP and I am looking through the data of April and May of this year when he was 100% compliant by days and 70% compliant by hours with an average of 4 hours and 51 minutes the residual AHI was 4.6 which indicates that his sleep apnea should  have been reduced quite significantly by about 90%.   However, there were some central apneas arising and the 95th percentile pressure was 12 cmH2O his air leaks were mild to moderate at 16 L/min.  The patient reports that use of CPAP had no influence on his headaches.  He continues to use CPAP recently and this referral now is to address if the patient may possibly be a candidate for an inspire device.  If it is hard for him to sleep with CPAP then the inspire device could be an alternative for patients with a body mass index of 32 or less, patients that do have obstructive sleep apnea without hypoxemia.      [name]   has a past medical history of Aortic stenosis, Arthritis, Coronary artery disease, Depression, Depression, Diabetes mellitus type 2, without complication ([place]), ED (erectile dysfunction), Fatty liver, Femoral-popliteal bypass graft occlusion, right ([place]) (1997), GERD (gastroesophageal reflux disease), Gout, H/O migraine, Headache(784.0), Hematuria, History of colonic polyps, Hyperlipemia, mixed, Hyperlipidemia, Hypertension, Irritable bowel syndrome (02/19/2020), cystic liver disease,   Migraines, Near syncope, Obesity, PAD (peripheral artery disease) ([place]), PONV (postoperative nausea and vomiting), and PVD (peripheral vascular disease) ([place]).   The patient had the first sleep study in the year June-2021  HST at [facility].    Sleep relevant medical history: sleep is better on CPAP- not  reporting bruxism, less neck pain, cervical DDD,   Family medical /sleep history: No other family member on CPAP with OSA.   Social history:  Patient is working as a Biochemist, clinical- travels locally and lives in a household with alone with dog.  Adult children, several  grandchildren.  The patient currently works.Tobacco use none .  ETOH use none ,  Caffeine intake in form of Coffee( 2 a day) Soda( 2-4 day ) Tea ( /) or energy drinks.     Sleep habits are as follows: The patient's dinner time is between 6  PM. The patient goes to bed at 9.30 PM and continues to sleep for 2-3 hours, wakes for 2 bathroom breaks, the first time at 2 AM.   The preferred sleep position is right sided  , had shoulder surgery on his left side =with the support of 2 pillows. Dreams are reportedly frequent/vivid.  6.30  AM is the usual rise time. The patient wakes up spontaneously.  He reports not feeling refreshed or restored in AM, with symptoms such as dry mouth, morning headaches, and residual fatigue.  Naps are taken frequently, lasting from 1 to 3 hours.  Review of Systems: Out of a complete 14 system review, the patient complains of only the following symptoms, and all other reviewed systems are negative.:  Fatigue, sleepiness , snoring, fragmented sleep, nocturia, bowel movements.    How likely are you to doze in the following situations: 0 = not likely, 1 = slight chance, 2 = moderate chance, 3 = high chance   Sitting and Reading? 0 Watching Television? 0 Sitting inactive in a public place (theater or meeting)?1 As a passenger in a car for an hour without a break?0 Lying down in the afternoon when circumstances permit?3 Sitting and talking to someone?0 Sitting quietly after lunch without alcohol?3 In a car, while stopped for a few minutes in traffic :0   Total =6-7  / 24 points   FSS endorsed at 40/ 63 points.   Social History   Socioeconomic History   Marital status: Divorced    Spouse name: Not on file   Number of children: 4   Years of education: Not on file   Highest education level: Bachelor's degree (e.g., BA, AB, BS)  Occupational History   Occupation: Horticulturist, commercial: Research scientist (life sciences)   Tobacco Use   Smoking status: Never   Smokeless tobacco: Never  Vaping Use   Vaping Use: Never used  Substance and Sexual Activity   Alcohol use: No   Drug use: No   Sexual activity: Not on file  Other Topics Concern   Not on file  Social History Narrative   Lives at home with his chocolate  lab   Left handed   Caffeine: about 1 cups daily   Social Determinants of Health   Financial Resource Strain: Not on file  Food Insecurity: Not on file  Transportation Needs: Not on file  Physical Activity: Not on file  Stress: Not on file  Social Connections: Not on file    Family History  Problem Relation Age of Onset   Hypertension Father    Diabetes Father    Lung cancer Father    Breast cancer Sister    Healthy Brother    Heart disease Maternal Grandmother    Healthy Maternal Grandfather    Cancer Maternal Grandfather    Migraines Neg Hx    Headache Neg Hx    Colon cancer Neg Hx  Pancreatic cancer Neg Hx    Stomach cancer Neg Hx    Esophageal cancer Neg Hx     Past Medical History:  Diagnosis Date   Aortic stenosis    a. severe bicuspid AV stenosis s/p pericardial tissue valve 2012.   Arthritis    Coronary artery disease    a. s/p CABG (LIMA-LCx) at time of AVR 2012, mild nonobstructive LAD and RCA stenoses.   Depression    Depression    Diabetes mellitus without complication [facility])    ED (erectile dysfunction)    Fatty liver    Femoral-popliteal bypass graft occlusion, right [facility]) 1997   Dr. 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   Obesity    PAD (peripheral artery disease) ([facility])    PONV (postoperative nausea and vomiting)    PVD (peripheral vascular disease) ([place])     Past Surgical History:  Procedure Laterality Date   AORTIC VALVE REPLACEMENT  07/18/2011   Procedure: AORTIC VALVE REPLACEMENT (AVR);  Surgeon: Grace Isaac, MD;  Location: [place];  Service: Open Heart Surgery;  Laterality: N/A;   CARDIAC CATHETERIZATION  2009,  07/11/11   Dr. Irish Lack   CORONARY ARTERY BYPASS GRAFT  07/18/2011   Procedure: CORONARY ARTERY BYPASS GRAFTING (CABG);  Surgeon: Grace Isaac, MD;  Location: [place];  Service: Open Heart Surgery;  Laterality: N/A;  times one to mammary artery, left   fem stent Right 2017   FEMORAL ARTERY - POPLITEAL ARTERY BYPASS GRAFT     KNEE ARTHROSCOPY     left   MRI     to visualize aortic valve   PERIPHERAL VASCULAR CATHETERIZATION N/A 07/12/2016   Procedure: Abdominal Aortogram w/Lower Extremity;  Surgeon: Waynetta Sandy, MD;  Location: [place] [facility];  Service: Cardiovascular;  Laterality: N/A;   PERIPHERAL VASCULAR CATHETERIZATION Right 07/12/2016   Procedure: Peripheral Vascular Atherectomy;  Surgeon: Waynetta Sandy, MD;  Location: [place] [facility];  Service: Cardiovascular;  Laterality: Right;  Anterior Tibial and Popliteal   ROTATOR CUFF REPAIR     Left   US ECHOCARDIOGRAPHY       Current Outpatient Medications on File Prior to Visit  Medication Sig Dispense Refill   Adalimumab 40 MG/0.8ML PNKT Inject 40 mg into the skin every 14 (fourteen) days.     amoxicillin (AMOXIL) 500 MG capsule TAKE 4 CAPSULES ONE HOUR BEFORE DENTAL APPOINTMENT. 8 capsule 0   aspirin 81 MG tablet Take 1 tablet (81 mg total) by mouth daily.     atorvastatin (LIPITOR) 80 MG tablet TAKE ONE TABLET AT BEDTIME. 90 tablet 3   cilostazol (PLETAL) 100 MG tablet TAKE (1) TABLET TWICE A DAY BEFORE MEALS. 60 tablet 11   citalopram (CELEXA) 40 MG tablet Take 40 mg by mouth daily at 12 noon.     Cyanocobalamin (VITAMIN B 12) 100 MCG LOZG Take 1 tablet by mouth daily.     fluticasone (FLONASE) 50 MCG/ACT nasal spray Place 1-2 sprays into both nostrils daily as needed for allergies or rhinitis.     Fremanezumab-vfrm (  AJOVY) 225 MG/1.5ML SOAJ Inject 225 mg into the skin every 30 (thirty) days. 6 mL 0   furosemide (LASIX) 20 MG tablet TAKE 1 TABLET ONCE DAILY. 30 tablet 11   lansoprazole (PREVACID) 15  MG capsule Take 15 mg by mouth daily as needed. For acid reflux     metoprolol tartrate (LOPRESSOR) 50 MG tablet TAKE (1/2) TABLET TWICE DAILY. Please keep upcoming appt in August 2022 with Dr. Burt Knack before anymore refills. Thank you 90 tablet 0   Riboflavin (VITAMIN B-2 PO) Take 400 mg by mouth daily.     Rimegepant Sulfate (NURTEC) 75 MG TBDP Take 75 mg by mouth daily as needed. For migraines. Take as close to onset of migraine as possible. One daily maximum. 4 tablet 0   sodium chloride (OCEAN) 0.65 % SOLN nasal spray Place 1 spray into both nostrils as needed for congestion.     tadalafil (CIALIS) 20 MG tablet Take 20 mg by mouth daily as needed for erectile dysfunction.     tamsulosin (FLOMAX) 0.4 MG CAPS capsule Take 0.4 mg by mouth daily.      traMADol (ULTRAM) 50 MG tablet TAKE (1) TABLET TWICE A DAY AS NEEDED. 30 tablet 0   No current facility-administered medications on file prior to visit.    Allergies  Allergen Reactions   Clopidogrel Itching   Sulfamethoxazole-Trimethoprim Other (See Comments)    Dizziness, flushed   Codeine Other (See Comments)    Blood pressure drops   Flexeril [Cyclobenzaprine]     sedation   Morphine And Related Itching    Physical exam:  Today's Vitals   03/07/21 1444  BP: 100/67  Pulse: 72  Weight: 207 lb (93.9 kg)  Height: 6' (1.829 m)   Body mass index is 28.07 kg/m.   Wt Readings from Last 3 Encounters:  03/07/21 207 lb (93.9 kg)  02/16/21 214 lb 6.4 oz (97.3 kg)  10/04/20 210 lb 11.2 oz (95.6 kg)     Ht Readings from Last 3 Encounters:  03/07/21 6' (1.829 m)  02/16/21 6' (1.829 m)  10/04/20 6' (1.829 m)      General: The patient is awake, alert and appears not in acute distress. The patient is well groomed. Head: Normocephalic, atraumatic. Neck is supple. Mallampati 3,  neck circumference:17 inches . Nasal airflow  patent.  Retrognathia is not seen.  Dental status: intact  Cardiovascular:  Regular rate and cardiac rhythm by  pulse,  without distended neck veins. Respiratory: Lungs are clear to auscultation.  Skin:  Without evidence of ankle edema, or rash. Trunk: The patient's posture is erect.   Neurologic exam : The patient is awake and alert, oriented to place and time.   Memory subjective described as intact.  Attention span & concentration ability appears normal.  Speech is fluent, without  dysarthria, dysphonia or aphasia.  Mood and affect are appropriate.   Cranial nerves: no loss of smell or taste reported  Pupils are equal and briskly reactive to light. Funduscopic exam deferred.  Extraocular movements in vertical and horizontal planes were intact and without nystagmus. No Diplopia. Visual fields by finger perimetry are intact. Hearing was intact to soft voice and finger rubbing.    Facial sensation intact to fine touch.  Facial motor strength is symmetric and tongue and uvula move midline.  Neck ROM :  increased tension- rotation, tilt and flexion extension were normal for age and shoulder shrug was symmetrical.    Motor exam:  Symmetric bulk, tone and ROM.  Normal tone without cog-wheeling, symmetric grip strength .   Deep tendon reflexes: in the  upper and lower extremities are symmetric and intact.  Babinski response was deferred .       After spending a total time of 30  minutes face to face and additional time for physical and neurologic examination, review of laboratory studies,  personal review of imaging studies, reports and results of other testing and review of referral information / records as far as provided in visit, I have established the following assessments:  Based on the results of his CPAP use this patient apnea is sufficiently treated and would not be the cause of his current headaches therefore an inspire treatment would not help him either.  He does not that the CPAP is too uncomfortable or causes him to have headaches.  Dawna Part also is a very expensive implant a pacemaker  for the tongue and I do not think it is justified for patient but could use CPAP.  So we are not dealing with sleep related headaches here but with another kind of headache disorder and this patient may need occipital nerve blocks or other treatment options.  I would like for him for reasons of cardiovascular health to continue with his CPAP use but I do not think that this is obviously the answer to controlling his headaches.   My Plan is to proceed with:  1) Ajovy- continue 2) CPAP continue 3) consider nerve block. cervical spine- surgery is considered.   I would like to thank Dr Jaynee Eagles, MD,  for allowing me to meet with and to take care of this pleasant patient.   In short, SHRITAN BLATCHFORD is presenting with sleep unrelated headaches, tension, neck and left sided migraine .  I will defer to Dr Jaynee Eagles.   Electronically signed by: Larey Seat, MD 03/07/2021 3:14 PM  [facility] Board certified by The [employer] of Sleep Medicine and Diplomate of the [employer] of Sleep Medicine. Board certified In Neurology through the [place], Fellow of the [employer] of Neurology. Medical Director of [facility].

## 2021-03-07 NOTE — Telephone Encounter (Signed)
Called and LMVM for pt that per insurance ajovy not approved until have tried aimovig or emgality.  Dr. Jaynee Eagles stated emgality is similar.  Would like to proceed if ok with you.

## 2021-03-07 NOTE — Telephone Encounter (Signed)
Plan member ID: [id number] Case number: [id number] Prescriber name: Sarina Ill Prescriber fax: [phone] [place] Dear [name], On 03/02/2021, we received a request from your provider for coverage of Ajovy Inj 225/1.5. An appropriate clinician reviewed all of the information you and/or your provider sent to [place]. Unfortunately, we must deny coverage. Why was my request denied? This request was denied because you did not meet the following requirements: Based on the information provided, you do not meet the established medication-specific criteria or guidelines for Ajovy at this time. Per your health plan's criteria, this drug is covered if you meet the following: (1) You have tried or cannot use both Aimovig and Emgality. The information provided does not show that you meet the criteria listed above. Please speak with your doctor about your choices. Reviewed by: [id number], RPh **Please note: The drug(s) listed above may require additional review.

## 2021-03-07 NOTE — Addendum Note (Signed)
Addended by: Brandon Melnick on: 03/07/2021 04:03 PM   Modules accepted: Orders

## 2021-03-08 ENCOUNTER — Telehealth: Payer: Self-pay | Admitting: Neurology

## 2021-03-08 MED ORDER — EMGALITY 120 MG/ML ~~LOC~~ SOAJ: SUBCUTANEOUS | 0 refills | Status: DC

## 2021-03-08 NOTE — Telephone Encounter (Signed)
I LMVM for pt that will go ahead and proceed with emgality for him and sent to pharmacy.  Asked for him to call about nerve block.

## 2021-03-08 NOTE — Telephone Encounter (Signed)
medicareBCBS [place] pending faxed notes

## 2021-03-08 NOTE — Telephone Encounter (Signed)
He already has an office visit with Dr. Jaynee Eagles 10/31. There's no NP openings before then.

## 2021-03-08 NOTE — Addendum Note (Signed)
Addended by: Brandon Melnick on: 03/08/2021 03:58 PM   Modules accepted: Orders

## 2021-03-09 ENCOUNTER — Telehealth: Payer: Self-pay | Admitting: *Deleted

## 2021-03-09 NOTE — Telephone Encounter (Signed)
Emgality PA, key: [id number]. Medications tried: metoprolol, depo-medrol, nortriptyline, topamax, amitriptyline,imitrex,maxalt. Information sent to [facility].

## 2021-03-09 NOTE — Telephone Encounter (Signed)
I called and spoke to patient.  I relayed that the Ajovy was a nonpreferred drug on his insurance plan they wanted him to try Emgality or Aimovig.  Dr. Jaynee Eagles suggested Emgality.  Patient has not been on any CGRP injectables previously so I relayed that a initial loading dose would be 2 injections and then thereafter 1 injection every 30 days.  He was okay to start this I did relay a prescription was sent in yesterday for him at his pharmacy.  It may require a prior authorization.  I also spoke to him about an appointment with one of our nurse practitioners for a nerve block.  He has never had one before.  I made an appointment for Thursday at 130 with [facility].  If there is any change to this after discussing with nurse practitioner will call him and let him know but for now it is for a nerve block with me again on 11 August at 130.  He verbalized understanding.  Appreciated call back.

## 2021-03-09 NOTE — Telephone Encounter (Signed)
Medicare no auth.  BCBS auth: MRI Brain: [id number] (exp. 03/08/21 to 04/07/21  MRA Head auth: [id number] (exp. 03/08/21 to 04/07/21)  Order sent to GI. They will reach out to the patient to schedule.

## 2021-03-10 ENCOUNTER — Other Ambulatory Visit: Payer: Self-pay

## 2021-03-10 ENCOUNTER — Encounter: Payer: Self-pay | Admitting: Adult Health

## 2021-03-10 ENCOUNTER — Ambulatory Visit: Payer: BC Managed Care – PPO | Admitting: Adult Health

## 2021-03-10 VITALS — BP 101/70 | HR 68 | Ht 72.0 in | Wt 213.0 lb

## 2021-03-10 DIAGNOSIS — Z9989 Dependence on other enabling machines and devices: Secondary | ICD-10-CM

## 2021-03-10 DIAGNOSIS — G4733 Obstructive sleep apnea (adult) (pediatric): Secondary | ICD-10-CM | POA: Diagnosis not present

## 2021-03-10 DIAGNOSIS — R519 Headache, unspecified: Secondary | ICD-10-CM | POA: Diagnosis not present

## 2021-03-10 DIAGNOSIS — I779 Disorder of arteries and arterioles, unspecified: Secondary | ICD-10-CM

## 2021-03-10 MED ORDER — KETOROLAC TROMETHAMINE 60 MG/2ML IM SOLN
60.0000 mg | Freq: Once | INTRAMUSCULAR | Status: AC
  Administered 2021-03-10: 60 mg via INTRAMUSCULAR

## 2021-03-10 NOTE — Telephone Encounter (Signed)
[employer] of [place] approved Emgality through 09/09/2021. Approval letter faxed to pharmacy.

## 2021-03-10 NOTE — Progress Notes (Addendum)
PATIENT: [name] DOB: [birth year]  REASON FOR VISIT: follow up HISTORY FROM: patient PRIMARY NEUROLOGIST: Dr. Jaynee Eagles   HISTORY OF PRESENT ILLNESS: Today 03/10/21:  [name] is a 70 year old male with a history of chronic daily headaches.  He returns today for occital nerve block recommended by Dr. Brett Fairy.  The patient states that he has chronic headaches on the left in the temporal region.  He states that has been there for 10 years.  They are typically daily severity fluctuates.  He has tried to refrain from taking daily over-the-counter medication but will use acetaminophen.  The patient has done 2 months of Ajovy injections.  Next month he will do [employer] as his insurance did not cover Ajovy.  He tried Nurtec with no benefit.  His MRI and MRA of the brain is scheduled for next week.  The patient does not have any pain in the occipital region.  He returns today for an evaluation.  HISTORY (copied from Dr. Cathren Laine note) [name] is a 70 y.o. male here as requested by Izora Gala, MD for migraines.  Past medical history of open heart surgery, obstructive sleep apnea unknown compliance, diabetes, migraines, headache, hypertension.   He is here again for chronic headaches behind the left eye. When he sneezes or cough it hurts. Saw him initially in 2020 and ordered MRI brain and sleep study, when we found OSA, he has been compliant, the headaches are always there 24x7, always behind the left eye, cpap may have helped the nocturnal and morning headaches but he still has daily chronic headaches. Very bad after he sneezes. When he has the headaches, he can have nausea, he has been taking daily tylenol but he knows about medication rebound, he has a history of migraines. Does not get worse standing for long periods, not positional in nature but it is exertional, when he lays down on his right  it helps, always on the left, pressure, pulsating, pounding, he as some vision changes but  doesn't have auras anymore like used. Seems to always be in the left eye.    Medications tried: metoprolol, depo-medrol, nortriptyline, topamax, amitriptyline,imitrex,maxalt  REVIEW OF SYSTEMS: Out of a complete 14 system review of symptoms, the patient complains only of the following symptoms, and all other reviewed systems are negative.  ALLERGIES: Allergies  Allergen Reactions   Clopidogrel Itching   Sulfamethoxazole-Trimethoprim Other (See Comments)    Dizziness, flushed   Codeine Other (See Comments)    Blood pressure drops   Flexeril [Cyclobenzaprine]     sedation   Morphine And Related Itching    HOME MEDICATIONS: Outpatient Medications Prior to Visit  Medication Sig Dispense Refill   Adalimumab 40 MG/0.8ML PNKT Inject 40 mg into the skin every 14 (fourteen) days.     amoxicillin (AMOXIL) 500 MG capsule TAKE 4 CAPSULES ONE HOUR BEFORE DENTAL APPOINTMENT. 8 capsule 0   aspirin 81 MG tablet Take 1 tablet (81 mg total) by mouth daily.     atorvastatin (LIPITOR) 80 MG tablet TAKE ONE TABLET AT BEDTIME. 90 tablet 3   cilostazol (PLETAL) 100 MG tablet TAKE (1) TABLET TWICE A DAY BEFORE MEALS. 60 tablet 11   citalopram (CELEXA) 40 MG tablet Take 40 mg by mouth daily at 12 noon.     Cyanocobalamin (VITAMIN B 12) 100 MCG LOZG Take 1 tablet by mouth daily.     fluticasone (FLONASE) 50 MCG/ACT nasal spray Place 1-2 sprays into both nostrils daily as needed for  allergies or rhinitis.     furosemide (LASIX) 20 MG tablet TAKE 1 TABLET ONCE DAILY. 30 tablet 11   Galcanezumab-gnlm (EMGALITY) 120 MG/ML SOAJ Take '240mg'$  initial dose  in 30 days then '120mg'$ /ml there after. 2.24 mL 0   lansoprazole (PREVACID) 15 MG capsule Take 15 mg by mouth daily as needed. For acid reflux     metoprolol tartrate (LOPRESSOR) 50 MG tablet TAKE (1/2) TABLET TWICE DAILY. Please keep upcoming appt in August 2022 with Dr. Burt Knack before anymore refills. Thank you 90 tablet 0   Riboflavin (VITAMIN B-2 PO) Take 400 mg  by mouth daily.     Rimegepant Sulfate (NURTEC) 75 MG TBDP Take 75 mg by mouth daily as needed. For migraines. Take as close to onset of migraine as possible. One daily maximum. 4 tablet 0   sodium chloride (OCEAN) 0.65 % SOLN nasal spray Place 1 spray into both nostrils as needed for congestion.     tamsulosin (FLOMAX) 0.4 MG CAPS capsule Take 0.4 mg by mouth daily.      traMADol (ULTRAM) 50 MG tablet TAKE (1) TABLET TWICE A DAY AS NEEDED. 30 tablet 0   tadalafil (CIALIS) 20 MG tablet Take 20 mg by mouth daily as needed for erectile dysfunction.     No facility-administered medications prior to visit.    PAST MEDICAL HISTORY: Past Medical History:  Diagnosis Date   Aortic stenosis    a. severe bicuspid AV stenosis s/p pericardial tissue valve 2012.   Arthritis    Coronary artery disease    a. s/p CABG (LIMA-LCx) at time of AVR 2012, mild nonobstructive LAD and RCA stenoses.   Depression    Depression    Diabetes mellitus without complication [facility])    ED (erectile dysfunction)    Fatty liver    Femoral-popliteal bypass graft occlusion, right [facility]) 1997   Dr. 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   Obesity    PAD (peripheral artery disease) ([facility])    PONV (postoperative nausea and vomiting)    PVD (peripheral vascular disease) ([place])     PAST SURGICAL HISTORY: Past Surgical History:  Procedure Laterality Date   AORTIC VALVE REPLACEMENT  07/18/2011   Procedure: AORTIC VALVE REPLACEMENT (AVR);  Surgeon: Grace Isaac, MD;  Location: [place];  Service: Open Heart Surgery;  Laterality: N/A;   CARDIAC CATHETERIZATION  2009, 07/11/11   Dr. Irish Lack   CORONARY ARTERY  BYPASS GRAFT  07/18/2011   Procedure: CORONARY ARTERY BYPASS GRAFTING (CABG);  Surgeon: Grace Isaac, MD;  Location: [place];  Service: Open Heart Surgery;  Laterality: N/A;  times one to mammary artery, left   fem stent Right 2017   FEMORAL ARTERY - POPLITEAL ARTERY BYPASS GRAFT     KNEE ARTHROSCOPY     left   MRI     to visualize aortic valve   PERIPHERAL VASCULAR CATHETERIZATION N/A 07/12/2016   Procedure: Abdominal Aortogram w/Lower Extremity;  Surgeon: Waynetta Sandy, MD;  Location: [place] [facility];  Service: Cardiovascular;  Laterality: N/A;   PERIPHERAL  VASCULAR CATHETERIZATION Right 07/12/2016   Procedure: Peripheral Vascular Atherectomy;  Surgeon: Waynetta Sandy, MD;  Location: [place] [facility];  Service: Cardiovascular;  Laterality: Right;  Anterior Tibial and Popliteal   ROTATOR CUFF REPAIR     Left   US ECHOCARDIOGRAPHY      FAMILY HISTORY: Family History  Problem Relation Age of Onset   Hypertension Father    Diabetes Father    Lung cancer Father    Breast cancer Sister    Healthy Brother    Heart disease Maternal Grandmother    Healthy Maternal Grandfather    Cancer Maternal Grandfather    Migraines Neg Hx    Headache Neg Hx    Colon cancer Neg Hx    Pancreatic cancer Neg Hx    Stomach cancer Neg Hx    Esophageal cancer Neg Hx     SOCIAL HISTORY: Social History   Socioeconomic History   Marital status: Divorced    Spouse name: Not on file   Number of children: 4   Years of education: Not on file   Highest education level: Bachelor's degree (e.g., BA, AB, BS)  Occupational History   Occupation: Horticulturist, commercial: Research scientist (life sciences)   Tobacco Use   Smoking status: Never   Smokeless tobacco: Never  Vaping Use   Vaping Use: Never used  Substance and Sexual Activity   Alcohol use: No   Drug use: No   Sexual activity: Not on file  Other Topics Concern   Not on file  Social History Narrative   Lives at home with his  chocolate lab   Left handed   Caffeine: about 1 cups daily   Social Determinants of Health   Financial Resource Strain: Not on file  Food Insecurity: Not on file  Transportation Needs: Not on file  Physical Activity: Not on file  Stress: Not on file  Social Connections: Not on file  Intimate Partner Violence: Not on file      PHYSICAL EXAM  Vitals:   03/10/21 1318  BP: 101/70  Pulse: 68  Weight: 213 lb (96.6 kg)  Height: 6' (1.829 m)   Body mass index is 28.89 kg/m.  Generalized: Well developed, in no acute distress   Neurological examination  Mentation: Alert oriented to time, place, history taking. Follows all commands speech and language fluent Cranial nerve II-XII: Pupils were equal round reactive to light. Extraocular movements were full, visual field were full on confrontational test. Facial sensation and strength were normal. Uvula tongue midline. Head turning and shoulder shrug  were normal and symmetric. Motor: The motor testing reveals 5 over 5 strength of all 4 extremities. Good symmetric motor tone is noted throughout.  Sensory: Sensory testing is intact to soft touch on all 4 extremities. No evidence of extinction is noted.  Coordination: Cerebellar testing reveals good finger-nose-finger and heel-to-shin bilaterally.  Gait and station: Gait is normal.  Reflexes: Deep tendon reflexes are symmetric and normal bilaterally.   DIAGNOSTIC DATA (LABS, IMAGING, TESTING) - I reviewed patient records, labs, notes, testing and imaging myself where available.  Lab Results  Component Value Date   WBC 7.3 04/14/2016   HGB 12.6 (L) 07/12/2016   HCT 37.0 (L) 07/12/2016   MCV 83.1 04/14/2016   PLT 290 04/14/2016      Component Value Date/Time   NA 139 12/15/2016 0857   K 4.3 12/15/2016 0857   CL 99 12/15/2016 0857   CO2 23 12/15/2016 0857  GLUCOSE 191 (H) 12/15/2016 0857   GLUCOSE 112 (H) 07/12/2016 0717   BUN 15 05/07/2020 1203   BUN 16 12/15/2016 0857    CREATININE 1.05 05/07/2020 1203   CREATININE 1.20 04/14/2016 1029   CALCIUM 9.1 12/15/2016 0857   PROT 6.6 05/12/2016 0854   ALBUMIN 3.9 05/12/2016 0854   AST 40 (H) 05/12/2016 0854   ALT 38 05/12/2016 0854   ALKPHOS 55 05/12/2016 0854   BILITOT 0.5 05/12/2016 0854   GFRNONAA 67 12/15/2016 0857   GFRAA 78 12/15/2016 0857   Lab Results  Component Value Date   CHOL 122 03/21/2018   HDL 42 03/21/2018   LDLCALC 52 03/21/2018   TRIG 138 03/21/2018   CHOLHDL 2.9 03/21/2018   Lab Results  Component Value Date   HGBA1C 5.8 (H) 07/14/2011   No results found for: VITAMINB12     ASSESSMENT AND PLAN 70 y.o. year old male  has a past medical history of Aortic stenosis, Arthritis, Coronary artery disease, Depression, Depression, Diabetes mellitus without complication ([place]), ED (erectile dysfunction), Fatty liver, Femoral-popliteal bypass graft occlusion, right ([place]) (1997), GERD (gastroesophageal reflux disease), Gout, H/O migraine, Headache(784.0), Hematuria, History of colonic polyps, Hyperlipemia, mixed, Hyperlipidemia, Hypertension, Irritable bowel syndrome (02/19/2020), Migraines, Near syncope, Obesity, PAD (peripheral artery disease) ([place]), PONV (postoperative nausea and vomiting), and PVD (peripheral vascular disease) ([place]). here with :  1.  Chronic daily headache  --Start Emgality at the end of the month after 2 injection cycles if this is not beneficial we will switch to Botox --The patient is not having any symptoms of occipital neuralgia.  Therefore occipital nerve block will not be performed today --Patient will be giving Toradol 60 mg IM --He is cautioned not to use over-the-counter medication on a daily basis as it will cause a rebound headache. --He will follow-up in 6 months or sooner if needed     Ward Givens, MSN, NP-C 03/10/2021, 2:31 PM [facility] [address], [place], [place] [address] [phone]    agree with assessment and  plan as stated.     Sarina Ill, MD [facility]

## 2021-03-10 NOTE — Progress Notes (Signed)
Pt here for appointment. Order for toradol '60mg'$  IM injection.  Under aseptic technique toradol '60mg'$ /62m IM given R deloid  .  Tolerated well.  Bandaid applied.

## 2021-03-10 NOTE — Addendum Note (Signed)
Addended by: Brandon Melnick on: 03/10/2021 04:07 PM   Modules accepted: Orders

## 2021-03-10 NOTE — Telephone Encounter (Signed)
I called and LMVM for pt that MM/NP will see him for eval then may or may not do nerve block.  Just wanted him to know.

## 2021-03-15 ENCOUNTER — Telehealth: Payer: Self-pay | Admitting: Neurology

## 2021-03-15 ENCOUNTER — Other Ambulatory Visit: Payer: Self-pay | Admitting: Neurology

## 2021-03-15 MED ORDER — ALPRAZOLAM 0.25 MG PO TABS: ORAL_TABLET | ORAL | 0 refills | Status: DC

## 2021-03-15 NOTE — Telephone Encounter (Signed)
Pt called wanting to know if doctor can call him in a medication to keep him relaxed during his MRI on tomorrow.

## 2021-03-16 ENCOUNTER — Ambulatory Visit: Payer: BC Managed Care – PPO | Admitting: Neurology

## 2021-03-17 ENCOUNTER — Ambulatory Visit
Admission: RE | Admit: 2021-03-17 | Discharge: 2021-03-17 | Disposition: A | Discharge status: Home / Self Care | Payer: BC Managed Care – PPO | Source: Ambulatory Visit | Attending: Neurology | Admitting: Neurology

## 2021-03-17 DIAGNOSIS — R519 Headache, unspecified: Secondary | ICD-10-CM

## 2021-03-17 DIAGNOSIS — H93A9 Pulsatile tinnitus, unspecified ear: Secondary | ICD-10-CM

## 2021-03-17 DIAGNOSIS — R51 Headache with orthostatic component, not elsewhere classified: Secondary | ICD-10-CM

## 2021-03-17 DIAGNOSIS — H539 Unspecified visual disturbance: Secondary | ICD-10-CM

## 2021-03-17 DIAGNOSIS — G4484 Primary exertional headache: Secondary | ICD-10-CM

## 2021-03-17 MED ORDER — GADOBENATE DIMEGLUMINE 529 MG/ML IV SOLN
20.0000 mL | Freq: Once | INTRAVENOUS | Status: AC | PRN
  Administered 2021-03-17: 20 mL via INTRAVENOUS

## 2021-03-24 DIAGNOSIS — H16142 Punctate keratitis, left eye: Secondary | ICD-10-CM | POA: Diagnosis not present

## 2021-03-24 DIAGNOSIS — T1512XA Foreign body in conjunctival sac, left eye, initial encounter: Secondary | ICD-10-CM | POA: Diagnosis not present

## 2021-03-24 DIAGNOSIS — H5712 Ocular pain, left eye: Secondary | ICD-10-CM | POA: Diagnosis not present

## 2021-03-28 ENCOUNTER — Ambulatory Visit (HOSPITAL_COMMUNITY): Payer: BC Managed Care – PPO | Attending: Cardiovascular Disease

## 2021-03-28 ENCOUNTER — Other Ambulatory Visit: Payer: Self-pay

## 2021-03-28 DIAGNOSIS — H5712 Ocular pain, left eye: Secondary | ICD-10-CM | POA: Diagnosis not present

## 2021-03-28 DIAGNOSIS — I359 Nonrheumatic aortic valve disorder, unspecified: Secondary | ICD-10-CM | POA: Diagnosis not present

## 2021-03-28 DIAGNOSIS — T1512XD Foreign body in conjunctival sac, left eye, subsequent encounter: Secondary | ICD-10-CM | POA: Diagnosis not present

## 2021-03-28 DIAGNOSIS — H16142 Punctate keratitis, left eye: Secondary | ICD-10-CM | POA: Diagnosis not present

## 2021-03-28 LAB — ECHOCARDIOGRAM COMPLETE
AR max vel: 1.24 cm2
AV Area VTI: 1.42 cm2
AV Area mean vel: 1.18 cm2
AV Mean grad: 6 mmHg
AV Peak grad: 12 mmHg
Ao pk vel: 1.73 m/s
Area-P 1/2: 4.46 cm2
S' Lateral: 2.8 cm

## 2021-03-28 MED ORDER — PERFLUTREN LIPID MICROSPHERE
1.0000 mL | INTRAVENOUS | Status: AC | PRN
  Administered 2021-03-28: 3 mL via INTRAVENOUS

## 2021-03-30 ENCOUNTER — Ambulatory Visit: Payer: BC Managed Care – PPO | Admitting: Cardiovascular Disease

## 2021-03-30 ENCOUNTER — Encounter: Payer: Self-pay | Admitting: Cardiovascular Disease

## 2021-03-30 ENCOUNTER — Other Ambulatory Visit: Payer: Self-pay

## 2021-03-30 VITALS — BP 100/70 | HR 86 | Ht 72.0 in | Wt 213.8 lb

## 2021-03-30 DIAGNOSIS — I739 Peripheral vascular disease, unspecified: Secondary | ICD-10-CM

## 2021-03-30 DIAGNOSIS — E782 Mixed hyperlipidemia: Secondary | ICD-10-CM

## 2021-03-30 DIAGNOSIS — I359 Nonrheumatic aortic valve disorder, unspecified: Secondary | ICD-10-CM

## 2021-03-30 DIAGNOSIS — I2510 Atherosclerotic heart disease of native coronary artery without angina pectoris: Secondary | ICD-10-CM

## 2021-03-30 DIAGNOSIS — I10 Essential (primary) hypertension: Secondary | ICD-10-CM

## 2021-03-30 NOTE — Patient Instructions (Signed)
Medication Instructions:  Your physician recommends that you continue on your current medications as directed. Please refer to the Current Medication list given to you today.  *If you need a refill on your cardiac medications before your next appointment, please call your pharmacy*   Lab Work: None Ordered If you have labs (blood work) drawn today and your tests are completely normal, you will receive your results only by: [place] (if you have MyChart) OR A paper copy in the mail If you have any lab test that is abnormal or we need to change your treatment, we will call you to review the results.   Testing/Procedures: None Ordered   Follow-Up: At [facility], you and your health needs are our priority.  As part of our continuing mission to provide you with exceptional heart care, we have created designated Provider Care Teams.  These Care Teams include your primary Cardiologist (physician) and Advanced Practice Providers (APPs -  Physician Assistants and Nurse Practitioners) who all work together to provide you with the care you need, when you need it.   Your next appointment:   1 year(s)  The format for your next appointment:   In Person  Provider:   You may see Sherren Mocha, MD or one of the following Advanced Practice Providers on your designated Care Team:   Richardson Dopp, PA-C [place], [place]

## 2021-03-30 NOTE — Progress Notes (Signed)
Cardiology Office Note:    Date:  03/30/2021   ID:  KORVER GALDI, DOB 1951/03/04, MRN ID:[id number]  PCP:  Caren Macadam, MD   [facility] Providers Cardiologist:  Sherren Mocha, MD     Referring MD: Caren Macadam, MD   Chief Complaint  Patient presents with   Hypertension    History of Present Illness:    [name] is a 70 y.o. male with a hx of severe bicuspid aortic valve stenosis status post pericardial tissue valve replacement in 2012.  The patient was also of coronary artery disease and he underwent single-vessel CABG with a LIMA to left complex graft.  The patient has a history of intermittent claudication undergone femoropopliteal bypass and endovascular treatments.  He is followed by vascular surgery.  The patient is here alone today.  He is doing well from a cardiac perspective.  He denies chest pain, chest pressure, or shortness of breath.  No orthopnea, PND, or heart palpitations.  He does have some limitation from right calf claudication.  The patient is compliant with his medications.  Past Medical History:  Diagnosis Date   Aortic stenosis    a. severe bicuspid AV stenosis s/p pericardial tissue valve 2012.   Arthritis    Coronary artery disease    a. s/p CABG (LIMA-LCx) at time of AVR 2012, mild nonobstructive LAD and RCA stenoses.   Depression    Depression    Diabetes mellitus without complication [facility])    ED (erectile dysfunction)    Fatty liver    Femoral-popliteal bypass graft occlusion, right [facility]) 1997   Dr. 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   Obesity    PAD (peripheral artery disease) (HCC)    PONV (postoperative  nausea and vomiting)    PVD (peripheral vascular disease) ([place])     Past Surgical History:  Procedure Laterality Date   AORTIC VALVE REPLACEMENT  07/18/2011   Procedure: AORTIC VALVE REPLACEMENT (AVR);  Surgeon: Grace Isaac, MD;  Location: [place];  Service: Open Heart Surgery;  Laterality: N/A;   CARDIAC CATHETERIZATION  2009, 07/11/11   Dr. Irish Lack   CORONARY ARTERY BYPASS GRAFT  07/18/2011   Procedure: CORONARY ARTERY BYPASS GRAFTING (CABG);  Surgeon: Grace Isaac, MD;  Location: [place];  Service: Open Heart Surgery;  Laterality: N/A;  times one to mammary artery, left   fem stent Right 2017   FEMORAL ARTERY - POPLITEAL ARTERY BYPASS GRAFT     KNEE ARTHROSCOPY     left   MRI     to visualize aortic valve   PERIPHERAL VASCULAR CATHETERIZATION N/A 07/12/2016   Procedure: Abdominal Aortogram w/Lower Extremity;  Surgeon: Waynetta Sandy, MD;  Location: [place] [facility];  Service: Cardiovascular;  Laterality: N/A;   PERIPHERAL VASCULAR CATHETERIZATION Right 07/12/2016   Procedure: Peripheral Vascular Atherectomy;  Surgeon: Waynetta Sandy, MD;  Location: [place] [facility];  Service: Cardiovascular;  Laterality: Right;  Anterior Tibial and Popliteal   ROTATOR CUFF REPAIR     Left   US ECHOCARDIOGRAPHY  Current Medications: Current Meds  Medication Sig   Adalimumab 40 MG/0.8ML PNKT Inject 40 mg into the skin every 14 (fourteen) days.   amoxicillin (AMOXIL) 500 MG capsule TAKE 4 CAPSULES ONE HOUR BEFORE DENTAL APPOINTMENT.   aspirin 81 MG tablet Take 1 tablet (81 mg total) by mouth daily.   atorvastatin (LIPITOR) 80 MG tablet TAKE ONE TABLET AT BEDTIME.   cilostazol (PLETAL) 100 MG tablet TAKE (1) TABLET TWICE A DAY BEFORE MEALS.   citalopram (CELEXA) 40 MG tablet Take 40 mg by mouth daily at 12 noon.   Cyanocobalamin (VITAMIN B 12) 100 MCG LOZG Take 1 tablet by mouth daily.   furosemide (LASIX) 20 MG tablet TAKE 1 TABLET ONCE DAILY.    Galcanezumab-gnlm (EMGALITY) 120 MG/ML SOAJ Take '240mg'$  initial dose  in 30 days then '120mg'$ /ml there after.   lansoprazole (PREVACID) 15 MG capsule Take 15 mg by mouth daily as needed. For acid reflux   metoprolol tartrate (LOPRESSOR) 50 MG tablet TAKE (1/2) TABLET TWICE DAILY. Please keep upcoming appt in August 2022 with Dr. Burt Knack before anymore refills. Thank you   Riboflavin (VITAMIN B-2 PO) Take 400 mg by mouth daily.   Rimegepant Sulfate (NURTEC) 75 MG TBDP Take 75 mg by mouth daily as needed. For migraines. Take as close to onset of migraine as possible. One daily maximum.   sodium chloride (OCEAN) 0.65 % SOLN nasal spray Place 1 spray into both nostrils as needed for congestion.   tamsulosin (FLOMAX) 0.4 MG CAPS capsule Take 0.4 mg by mouth daily.    traMADol (ULTRAM) 50 MG tablet TAKE (1) TABLET TWICE A DAY AS NEEDED.     Allergies:   Clopidogrel, Sulfamethoxazole-trimethoprim, Codeine, Flexeril [cyclobenzaprine], and Morphine and related   Social History   Socioeconomic History   Marital status: Divorced    Spouse name: Not on file   Number of children: 4   Years of education: Not on file   Highest education level: Bachelor's degree (e.g., BA, AB, BS)  Occupational History   Occupation: Horticulturist, commercial: Research scientist (life sciences)   Tobacco Use   Smoking status: Never   Smokeless tobacco: Never  Vaping Use   Vaping Use: Never used  Substance and Sexual Activity   Alcohol use: No   Drug use: No   Sexual activity: Not on file  Other Topics Concern   Not on file  Social History Narrative   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   Left handed   Caffeine: about 1 cups daily   Social Determinants of Health   Financial Resource Strain: Not on file  Food Insecurity: Not on file  Transportation Needs: Not on file  Physical Activity: Not on file  Stress: Not on file  Social Connections: Not on file     Family History: The patient's family history includes Breast cancer in his  sister; Cancer in his maternal grandfather; Diabetes in his father; Healthy in his brother and maternal grandfather; Heart disease in his maternal grandmother; Hypertension in his father; Lung cancer in his father. There is no history of Migraines, Headache, Colon cancer, Pancreatic cancer, Stomach cancer, or Esophageal cancer.  ROS:   Please see the history of present illness.    All other systems reviewed and are negative.  EKGs/Labs/Other Studies Reviewed:    The following studies were reviewed today: 2D echocardiogram 03/28/2021: 1. Previously noted flow concernin for perimembranous VSD vs perivalvular  aortic valve regurgitaiton is not visualized on this study.  2. Mild septal hypokinesis, consistent with post-surgical state. Left  ventricular ejection fraction, by estimation, is 60 to 65%. The left  ventricle has normal function. The left ventricle demonstrates regional  wall motion abnormalities (see scoring  diagram/findings for description). There is mild concentric left  ventricular hypertrophy. Left ventricular diastolic parameters are  consistent with Grade I diastolic dysfunction (impaired relaxation).  Elevated left ventricular end-diastolic pressure.   3. Right ventricular systolic function is normal. The right ventricular  size is normal.   4. The mitral valve is normal in structure. Trivial mitral valve  regurgitation. No evidence of mitral stenosis.   5. The aortic valve has been repaired/replaced. Aortic valve  regurgitation is not visualized. No aortic stenosis is present.   6. Aortic dilatation noted. There is mild dilatation of the ascending  aorta, measuring 37 mm.   7. The inferior vena cava is normal in size with greater than 50%  respiratory variability, suggesting right atrial pressure of 3 mmHg.   EKG:  EKG is not ordered today.    Recent Labs: 05/07/2020: BUN 15; Creatinine, Ser 1.05  Recent Lipid Panel    Component Value Date/Time   CHOL 122  03/21/2018 0755   TRIG 138 03/21/2018 0755   HDL 42 03/21/2018 0755   CHOLHDL 2.9 03/21/2018 0755   CHOLHDL 4.5 05/12/2016 0854   VLDL 28 05/12/2016 0854   LDLCALC 52 03/21/2018 0755     Risk Assessment/Calculations:           Physical Exam:    VS:  BP 100/70   Pulse 86   Ht 6' (1.829 m)   Wt 213 lb 12.8 oz (97 kg)   SpO2 98%   BMI 29.00 kg/m     Wt Readings from Last 3 Encounters:  03/30/21 213 lb 12.8 oz (97 kg)  03/10/21 213 lb (96.6 kg)  03/07/21 207 lb (93.9 kg)     GEN:  Well nourished, well developed in no acute distress HEENT: Normal NECK: No JVD; No carotid bruits LYMPHATICS: No lymphadenopathy CARDIAC: RRR, 1/6 systolic ejection murmur at the right upper sternal border RESPIRATORY:  Clear to auscultation without rales, wheezing or rhonchi  ABDOMEN: Soft, non-tender, non-distended MUSCULOSKELETAL:  No edema; No deformity  SKIN: Warm and dry NEUROLOGIC:  Alert and oriented x 3 PSYCHIATRIC:  Normal affect   ASSESSMENT:    1. Aortic valve disorder   2. Peripheral vascular disease ([place])   3. Coronary artery disease involving native coronary artery of native heart without angina pectoris   4. Essential hypertension   5. Mixed hyperlipidemia    PLAN:    In order of problems listed above:  The patient is doing very well with respect to his bioprosthetic aortic valve.  He has no significant valve dysfunction, no paravalvular leak, and normal gradients.  I would like to see him back in 1 year for follow-up evaluation.  I would anticipate a repeat echocardiogram in 2 years. Stable right calf claudication reported.  Followed by vascular surgery. No anginal chest pain.  Medical program reviewed and includes a high intensity statin drug, aspirin for antiplatelet therapy, and a beta-blocker. Blood pressure is very well controlled on his current medical regimen. Labs reviewed.  LDL cholesterol 77 mg/dL, HDL 40.  Continue high-dose atorvastatin.     Medication  Adjustments/Labs and Tests Ordered: Current medicines are reviewed at length with the patient today.  Concerns regarding medicines are outlined above.  No orders of the defined types were placed in this  encounter.  No orders of the defined types were placed in this encounter.   There are no Patient Instructions on file for this visit.   Signed, Sherren Mocha, MD  03/30/2021 8:58 AM    [facility]

## 2021-04-25 ENCOUNTER — Telehealth: Payer: Self-pay

## 2021-04-25 DIAGNOSIS — K862 Cyst of pancreas: Secondary | ICD-10-CM

## 2021-04-25 NOTE — Telephone Encounter (Signed)
Patient has been scheduled for an MRI at [facility] on 05-10-21. Appointment time is 10:00 am. Arrive to admitting (at main entrance of the hospital) 30 minutes prior to appointment time for registration purposes. Do not to have anything to eat or drink 4 hours prior to test.  This test typically takes 45 minutes to 1 hour to complete.

## 2021-05-05 ENCOUNTER — Other Ambulatory Visit: Payer: Self-pay | Admitting: Neurology

## 2021-05-10 ENCOUNTER — Other Ambulatory Visit: Payer: Self-pay | Admitting: Gastroenterology

## 2021-05-10 ENCOUNTER — Other Ambulatory Visit: Payer: Self-pay | Admitting: Cardiovascular Disease

## 2021-05-10 ENCOUNTER — Ambulatory Visit (HOSPITAL_COMMUNITY)
Admission: RE | Admit: 2021-05-10 | Discharge: 2021-05-10 | Disposition: A | Discharge status: Home / Self Care | Payer: BC Managed Care – PPO | Source: Ambulatory Visit | Attending: Gastroenterology | Admitting: Gastroenterology

## 2021-05-10 DIAGNOSIS — N281 Cyst of kidney, acquired: Secondary | ICD-10-CM | POA: Diagnosis not present

## 2021-05-10 DIAGNOSIS — K449 Diaphragmatic hernia without obstruction or gangrene: Secondary | ICD-10-CM | POA: Diagnosis not present

## 2021-05-10 DIAGNOSIS — K862 Cyst of pancreas: Secondary | ICD-10-CM | POA: Diagnosis not present

## 2021-05-10 MED ORDER — GADOBUTROL 1 MMOL/ML IV SOLN
10.0000 mL | Freq: Once | INTRAVENOUS | Status: AC | PRN
  Administered 2021-05-10: 10 mL via INTRAVENOUS

## 2021-05-30 ENCOUNTER — Telehealth (INDEPENDENT_AMBULATORY_CARE_PROVIDER_SITE_OTHER): Payer: BC Managed Care – PPO | Admitting: Neurology

## 2021-05-30 ENCOUNTER — Encounter: Payer: Self-pay | Admitting: *Deleted

## 2021-05-30 DIAGNOSIS — R291 Meningismus: Secondary | ICD-10-CM | POA: Diagnosis not present

## 2021-05-30 DIAGNOSIS — G96810 Intracranial hypotension, unspecified: Secondary | ICD-10-CM | POA: Diagnosis not present

## 2021-05-30 DIAGNOSIS — G9601 Cranial cerebrospinal fluid leak, spontaneous: Secondary | ICD-10-CM | POA: Diagnosis not present

## 2021-05-30 DIAGNOSIS — M4802 Spinal stenosis, cervical region: Secondary | ICD-10-CM | POA: Diagnosis not present

## 2021-05-30 NOTE — Progress Notes (Addendum)
[facility]    Provider:  Dr Jaynee Eagles Requesting Provider: Caren Macadam, MD Primary Care Provider:  Caren Macadam, MD  CC:  migraines  Virtual Visit via Video Note  I connected with [name] on 06/09/21 at  8:30 AM EDT by a video enabled telemedicine application and verified that I am speaking with the correct person using two identifiers.  Location: Patient: home Provider: office   I discussed the limitations of evaluation and management by telemedicine and the availability of in person appointments. The patient expressed understanding and agreed to proceed.     Follow Up Instructions:    I discussed the assessment and treatment plan with the patient. The patient was provided an opportunity to ask questions and all were answered. The patient agreed with the plan and demonstrated an understanding of the instructions.   The patient was advised to call back or seek an in-person evaluation if the symptoms worsen or if the condition fails to improve as anticipated.  I provided over 40 minutes of non-face-to-face time during this encounter.   Melvenia Beam, MD   Interval history May 30, 2021: Patient here for chronic daily headaches.  He has obstructive sleep apnea AHI of 52 seen in 2020, using CPAP help with his nocturnal and morning headaches but did not help with his daily or exertional headaches, behind the left eye, also with a component of rebound headache, medication overuse headache.  Patient had an occipital nerve block on March 10, 2021.  His insurance did not cover Ajovy so he was prescribed Emgality in August 2022.He follows with [place] [facility].   Emgality did not help. He is using is cpap, still having daily headaches, his worse time is if he sneezes.   Treating his headaches with medication and CGRP medications have not helped. Treating his OSA with cpap only helped his nocturnal and morning headaches. MRI showed possible  low pressure headache(slight enhancement of the pachymeninges stable from 2020). MRI cervical spine in the past showed stenosis, need to ensure he was seen by neurosurgery.   HPI 02/16/2021:  [name] is a 70 y.o. male here as requested by Caren Macadam, MD for migraines.  Past medical history of open heart surgery, obstructive sleep apnea unknown compliance, diabetes, migraines, headache, hypertension.  Imaging performed 03/17/2021: MRI of the head was normal (personally reviewed images and agree) MRI of the brain showed scattered T2/FLAIR hyperintense foci in the hemispheres consistent with minimal chronic microvascular ischemic changes, they do not enhance, more than typical enhancement of the packing meninges which may cause intracranial hypotension, similar to March 2020.  He is here again for chronic headaches behind the left eye. When he sneezes or cough it hurts. Saw him initially in 2020 and ordered MRI brain and sleep study, when we found OSA, he has been compliant, the headaches are always there 24x7, always behind the left eye, cpap may have helped the nocturnal and morning headaches but he still has daily chronic headaches. Very bad after he sneezes. When he has the headaches, he can have nausea, he has been taking daily tylenol but he knows about medication rebound, he has a history of migraines. Does not get worse standing for long periods, not positional in nature but it is exertional, when he lays down on his right  it helps, always on the left, pressure, pulsating, pounding, he as some vision changes but doesn't have auras anymore like used. Seems to always be in the left eye.  Medications tried: metoprolol, depo-medrol, nortriptyline, topamax, amitriptyline,imitrex,maxalt  MRI brain 10/11/2018: The brain parenchyma shows only minor age-appropriate changes of chronic microvascular ischemia. No structural lesion, tumor infarcts are noted. No abnormal lesions are seen on  diffusion-weighted views to suggest acute ischemia. The cortical sulci, fissures and cisterns are normal in size and appearance. Lateral, third and fourth ventricle are normal in size and appearance. No extra-axial fluid collections are seen. No evidence of mass effect or midline shift.  No abnormal lesions are seen on post contrast views.  On sagittal views the posterior fossa, pituitary gland and corpus callosum are unremarkable. No evidence of intracranial hemorrhage on gradient-echo views. The orbits and their contents, paranasal sinuses and calvarium are unremarkable.  Intracranial flow voids are present.       IMPRESSION:  Unremarkable MRI scan of the brain with and without contrast. Personally reviewed and agreed  AHI 52 2020 tried cpap  I reviewed Dr. Janeice Robinson notes from ENT, patient was seen in June of this year, for the past 10 years after undergoing open heart surgery he has had chronic almost daily left frontal headaches, CT of the sinuses 2 years ago was completely negative, he does have a history of migraine when he was younger, they were migraines with aura and he no longer has the aura he has some GI symptoms associated with the headaches and also suffers with sleep apnea and was prescribed CPAP about a year ago.  Dr. Janeice Robinson general exam was normal, diagnosis not sinus in origin likely migraine related, and he was referred to Dr. Redmond Baseman for possible candidacy for inspire implant.  Reviewed notes, labs and imaging from outside physicians, which showed:  Labs July 2020 to include normal CBC, CMP with creatinine 1.12, BUN 18 otherwise unremarkable,  FINDINGS: MRI Cervical spine 10/2020 Alignment: Normal except for 1 mm of anterolisthesis at C7-T1.   Vertebrae: No fracture or primary bone lesion. See below regarding facet arthropathy on the right at C3-4.   Cord: No cord compression or primary cord lesion. See below regarding stenosis at C3-4.   Posterior Fossa, vertebral arteries,  paraspinal tissues: Negative   Disc levels:   Foramen magnum is widely patent. C1-2 articulation shows mild osteoarthritis but no encroachment upon the neural structures.   C2-3: Spondylosis with endplate osteophytes and protruding disc material more prominent towards the left. Narrowing of the ventral subarachnoid space but no compression of the cord. Foraminal narrowing on the left that could possibly affect the left C3 nerve.   C3-4: Spondylosis with endplate osteophytes and shallow protrusion of the disc. Facet degeneration and hypertrophy, worse on the right, with edema on the right. There is canal narrowing with AP diameter in the midline measuring 7.7 mm. The subarachnoid space surrounding the cord is effaced but the cord is not frankly compressed. Bilateral foraminal stenosis could affect either C4 nerve. Edematous facet arthropathy on the right could certainly be symptomatic.   C4-5: Endplate osteophytes and bulging of the disc. Mild narrowing of the ventral subarachnoid space but no compressive narrowing of the canal. Mild facet hypertrophy. Mild bilateral foraminal narrowing, not grossly compressive.   C5-6: Spondylosis with uncovertebral hypertrophy on the left. Facet degeneration and hypertrophy on the left. No compressive canal stenosis. Left foraminal stenosis could compress the left C6 nerve.   C6-7: Left-sided predominant uncovertebral prominence and disc bulge. Mild left foraminal narrowing, not definitely compressive.   C7-T1: Facet osteoarthritis on the left allowing 1 mm of anterolisthesis. Left foraminal narrowing that could affect  the left C8 nerve.   IMPRESSION: In this patient with right-sided symptoms, the most concerning findings are at the C3-4 level. There is spondylosis with endplate osteophytes and shallow protrusion of disc material resulting in canal narrowing with AP diameter in the midline of only 7.7 mm. The subarachnoid space is effaced  but the cord is not frankly compressed. There is bilateral facet degeneration and hypertrophy, with edematous change on the right. There is bilateral foraminal stenosis that could affect either C4 nerve. Edematous facet arthropathy on the right could be a cause of right neck symptoms.   C2-3: Left-sided predominant spondylosis with left foraminal narrowing that could affect the left C3 nerve.   C4-5 spondylosis and facet hypertrophy with mild bilateral foraminal narrowing not definitely compressive.   C5-6: Left-sided predominant spondylosis and facet arthropathy with left foraminal narrowing that could affect the left C6 nerve.   C7-T1: Left-sided facet arthropathy at C7-T1 with 1 mm of anterolisthesis and left foraminal narrowing that could affect the left C8 nerve.  Review of Systems: Patient complains of symptoms per HPI as well as the following symptoms: imbalance, headache, neckpain . Pertinent negatives and positives per HPI. All others negative    Social History   Socioeconomic History   Marital status: Divorced    Spouse name: Not on file   Number of children: 4   Years of education: Not on file   Highest education level: Bachelor's degree (e.g., BA, AB, BS)  Occupational History   Occupation: Horticulturist, commercial: Research scientist (life sciences)   Tobacco Use   Smoking status: Never   Smokeless tobacco: Never  Vaping Use   Vaping Use: Never used  Substance and Sexual Activity   Alcohol use: No   Drug use: No   Sexual activity: Not on file  Other Topics Concern   Not on file  Social History Narrative   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   Left handed   Caffeine: about 1 cups daily   Social Determinants of Health   Financial Resource Strain: Not on file  Food Insecurity: Not on file  Transportation Needs: Not on file  Physical Activity: Not on file  Stress: Not on file  Social Connections: Not on file  Intimate Partner Violence: Not on file    Family History   Problem Relation Age of Onset   Hypertension Father    Diabetes Father    Lung cancer Father    Breast cancer Sister    Healthy Brother    Heart disease Maternal Grandmother    Healthy Maternal Grandfather    Cancer Maternal Grandfather    Migraines Neg Hx    Headache Neg Hx    Colon cancer Neg Hx    Pancreatic cancer Neg Hx    Stomach cancer Neg Hx    Esophageal cancer Neg Hx     Past Medical History:  Diagnosis Date   Aortic stenosis    a. severe bicuspid AV stenosis s/p pericardial tissue valve 2012.   Arthritis    Coronary artery disease    a. s/p CABG (LIMA-LCx) at time of AVR 2012, mild nonobstructive LAD and RCA stenoses.   Depression    Depression    Diabetes mellitus without complication [facility])    ED (erectile dysfunction)    Fatty liver    Femoral-popliteal bypass graft occlusion, right [facility]) 1997   Dr. 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   Obesity    PAD (peripheral artery disease) (HCC)    PONV (postoperative nausea and vomiting)    PVD (peripheral vascular disease) ([place])     Patient Active Problem List   Diagnosis Date Noted   Cervical stenosis of spinal canal 06/07/2021   Meningeal irritation 06/07/2021   Idiopathic intracranial hypotension 06/07/2021   Cervicalgia 03/07/2021   Chronic daily headache 03/07/2021   Renal stones 10/04/2020   Type 2 diabetes mellitus with unspecified complications ([place]) 53/66/4403   Benign prostatic hyperplasia without lower urinary tract symptoms 09/30/2020   GERD (gastroesophageal reflux disease) 05/05/2020   Irritable bowel syndrome 02/19/2020   Dependence on other enabling machines and devices  02/19/2020   ED (erectile dysfunction) of organic origin 02/19/2020   Major depression single episode, in partial remission ([place]) 02/19/2020   Obesity 02/19/2020   Chronic right-sided low back pain without sciatica 12/16/2019   Primary osteoarthritis of left knee 09/16/2019   Rotator cuff tear arthropathy of left shoulder 09/16/2019   Primary osteoarthritis of right knee 09/16/2019   Complex tear of medial meniscus of right knee as current injury 03/04/2019   Chronic diastolic heart failure ([place]) 11/25/2018   Bruxism (teeth grinding) 11/25/2018   Cephalalgia 11/25/2018   Cerebrovascular disease 11/25/2018   Morning headache 11/25/2018   Sleep related headaches 09/26/2018   Chronic idiopathic gout without tophus 02/13/2018   Hypertension    History of adenomatous polyp of colon 03/14/2016   Thrombosed hemorrhoids 03/01/2016   Encounter for long-term (current) use of high-risk medication 06/30/2015   Psoriasis 12/25/2014   Major depressive disorder, recurrent, in full remission ([place]) 06/22/2014   Gout 04/21/2014   Chest pain, somewhat atypical 12/28/2013   Dyspnea on exertion 12/27/2011   Dizziness 08/15/2011   Heart valve replaced by other means 07/28/2011   Aortic stenosis, severe 07/18/2011   Coronary artery disease 07/06/2011   Peripheral vascular disease ([place]) 07/06/2011   Hyperlipidemia 07/06/2011   Bicuspid aortic valve 07/06/2011    Past Surgical History:  Procedure Laterality Date   AORTIC VALVE REPLACEMENT  07/18/2011   Procedure: AORTIC VALVE REPLACEMENT (AVR);  Surgeon: Grace Isaac, MD;  Location: [place];  Service: Open Heart Surgery;  Laterality: N/A;   CARDIAC CATHETERIZATION  2009, 07/11/11   Dr. Irish Lack   CORONARY ARTERY BYPASS GRAFT  07/18/2011   Procedure: CORONARY ARTERY BYPASS GRAFTING (CABG);  Surgeon: Grace Isaac, MD;  Location: [place];  Service: Open Heart Surgery;  Laterality: N/A;  times one to mammary artery, left   fem stent Right 2017    FEMORAL ARTERY - POPLITEAL ARTERY BYPASS GRAFT     KNEE ARTHROSCOPY     left   MRI     to visualize aortic valve   PERIPHERAL VASCULAR CATHETERIZATION N/A 07/12/2016   Procedure: Abdominal Aortogram w/Lower Extremity;  Surgeon: Waynetta Sandy, MD;  Location: [place] [facility];  Service: Cardiovascular;  Laterality: N/A;   PERIPHERAL VASCULAR CATHETERIZATION Right 07/12/2016   Procedure: Peripheral Vascular Atherectomy;  Surgeon: Waynetta Sandy, MD;  Location: [place] [facility];  Service: Cardiovascular;  Laterality: Right;  Anterior Tibial and  Popliteal   ROTATOR CUFF REPAIR     Left   US ECHOCARDIOGRAPHY      Current Outpatient Medications  Medication Sig Dispense Refill   Adalimumab 40 MG/0.8ML PNKT Inject 40 mg into the skin every 14 (fourteen) days.     amoxicillin (AMOXIL) 500 MG capsule TAKE 4 CAPSULES ONE HOUR BEFORE DENTAL APPOINTMENT. 8 capsule 0   aspirin 81 MG tablet Take 1 tablet (81 mg total) by mouth daily.     atorvastatin (LIPITOR) 80 MG tablet TAKE ONE TABLET AT BEDTIME. 90 tablet 3   cilostazol (PLETAL) 100 MG tablet TAKE (1) TABLET TWICE A DAY BEFORE MEALS. 60 tablet 11   citalopram (CELEXA) 40 MG tablet Take 40 mg by mouth daily at 12 noon.     Cyanocobalamin (VITAMIN B 12) 100 MCG LOZG Take 1 tablet by mouth daily.     furosemide (LASIX) 20 MG tablet TAKE 1 TABLET ONCE DAILY. 30 tablet 11   Galcanezumab-gnlm (EMGALITY) 120 MG/ML SOAJ Inject in to skin every 30 days. 1.12 mL 11   lansoprazole (PREVACID) 15 MG capsule Take 15 mg by mouth daily as needed. For acid reflux     metoprolol tartrate (LOPRESSOR) 50 MG tablet TAKE (1/2) TABLET TWICE DAILY. Please keep upcoming appt in August 2022 with Dr. Burt Knack before anymore refills. Thank you 90 tablet 0   Riboflavin (VITAMIN B-2 PO) Take 400 mg by mouth daily.     Rimegepant Sulfate (NURTEC) 75 MG TBDP Take 75 mg by mouth daily as needed. For migraines. Take as close to onset of migraine as possible.  One daily maximum. 4 tablet 0   sodium chloride (OCEAN) 0.65 % SOLN nasal spray Place 1 spray into both nostrils as needed for congestion.     tamsulosin (FLOMAX) 0.4 MG CAPS capsule Take 0.4 mg by mouth daily.      traMADol (ULTRAM) 50 MG tablet TAKE (1) TABLET TWICE A DAY AS NEEDED. 30 tablet 0   No current facility-administered medications for this visit.    Allergies as of 05/30/2021 - Review Complete 03/30/2021  Allergen Reaction Noted   Clopidogrel Itching 08/18/2016   Sulfamethoxazole-trimethoprim Other (See Comments) 09/26/2018   Codeine Other (See Comments) 07/06/2011   Flexeril [cyclobenzaprine]  09/26/2018   Morphine and related Itching 07/14/2011    Vitals: There were no vitals taken for this visit. Last Weight:  Wt Readings from Last 1 Encounters:  03/30/21 213 lb 12.8 oz (97 kg)   Last Height:   Ht Readings from Last 1 Encounters:  03/30/21 6' (1.829 m)   Physical exam: Exam: Gen: NAD, conversant      CV: attempted, Could not perform over Web Video. Denies palpitations or chest pain or SOB. VS: Breathing at a normal rate. Weight appears overweight. Not febrile. Eyes: Conjunctivae clear without exudates or hemorrhage  Neuro: Detailed Neurologic Exam  Speech:    Speech is normal; fluent and spontaneous with normal comprehension.  Cognition:    The patient is oriented to person, place, and time;     recent and remote memory intact;     language fluent;     normal attention, concentration,     fund of knowledge Cranial Nerves:    The pupils are equal, round, and reactive to light. Attempted, Cannot perform fundoscopic exam. Visual fields are full to finger confrontation. Extraocular movements are intact.  The face is symmetric with normal sensation. The palate elevates in the midline. Hearing intact. Voice is normal. Shoulder shrug is normal.  The tongue has normal motion without fasciculations.   Coordination:    Normal finger to nose  Gait:    Normal  native gait  Motor Observation:   no involuntary movements noted. Tone:    Appears normal  Posture:    Posture is normal. normal erect    Strength:    Strength is anti-gravity and symmetric in the upper and lower limbs.      Sensation: intact to LT         Assessment/Plan:  Chronic daily headaches, exertional especially with sneezing, chronic pachymeningeal enhancement wich can be c/w intracranial hypotension.  - Severe cervical stenosis at C3/C4, I have encouraged him to f/u with Neurosurgery who has been recommending decompression. I also recommend acdf. He is in agreement and I have reached out to Dr. Junius Roads who he sees at [facility]. Dr. Junius Roads responded, he is no longer with [facility], will place new referral. - Chronic Enhancement of the pachymeninges on MRI brain, likely chronic intracranial hypotension. Given intractable headaches will order prone CT sinuses to look for csf fluid leak (he describes nasal drainage)and if negative then consider CT Myelography to see if we can find a csf leak. I've reached out to Dr. Jobe Igo at [facility] imaging who suggested prone CT sinuses to look for fluid. prone CT sinuses to look for csf fluid leak (nasal drainage,intractable headaches, and signs of intracranial hypotension on MRI brain) - He is compliant with CPAP (AHI 52) for his severe sleep apnea. The cpap helped with morning and nocturnal headaches but he is still with headaches in the day after getting up and still continues to have chronic daily headaches  - He has stopped taking daily analgesics to avoid medication overuse headaches - We have tried multiple migraine medications including the new CGRP medications(Ajovy, Emgality, Nurtec etc) to no avail    Orders Placed This Encounter  Procedures   NM Cisternogram   Ambulatory referral to Neurosurgery    Cc: Caren Macadam, MD,  Caren Macadam, MD  Sarina Ill, MD  [facility] [address] [place], [place] [address]  Phone [phone] Fax [phone]  I spent over 40 minutes of face-to-face and non-face-to-face time with patient on the  1. Idiopathic intracranial hypotension   2. Meningeal irritation   3. Cervical stenosis of spinal canal     diagnosis.  This included previsit chart review, lab review, study review, order entry, electronic health record documentation, patient education on the different diagnostic and therapeutic options, counseling and coordination of care, risks and benefits of management, compliance, or risk factor reduction

## 2021-06-07 ENCOUNTER — Encounter: Payer: Self-pay | Admitting: Neurology

## 2021-06-07 DIAGNOSIS — Z6829 Body mass index (BMI) 29.0-29.9, adult: Secondary | ICD-10-CM | POA: Diagnosis not present

## 2021-06-07 DIAGNOSIS — I10 Essential (primary) hypertension: Secondary | ICD-10-CM | POA: Diagnosis not present

## 2021-06-07 DIAGNOSIS — M4802 Spinal stenosis, cervical region: Secondary | ICD-10-CM | POA: Insufficient documentation

## 2021-06-07 DIAGNOSIS — G96810 Intracranial hypotension, unspecified: Secondary | ICD-10-CM | POA: Insufficient documentation

## 2021-06-07 DIAGNOSIS — M4722 Other spondylosis with radiculopathy, cervical region: Secondary | ICD-10-CM | POA: Diagnosis not present

## 2021-06-07 DIAGNOSIS — R291 Meningismus: Secondary | ICD-10-CM | POA: Insufficient documentation

## 2021-06-07 NOTE — Addendum Note (Signed)
Addended by: Sarina Ill B on: 06/07/2021 04:09 PM   Modules accepted: Orders

## 2021-06-08 ENCOUNTER — Telehealth: Payer: Self-pay | Admitting: Neurology

## 2021-06-08 NOTE — Telephone Encounter (Signed)
Referral sent to [facility]. Phone: [phone].

## 2021-06-08 NOTE — Telephone Encounter (Signed)
Pt request refill forGalcanezumab-gnlm (EMGALITY) 120 MG/ML SOAJ at [facility]

## 2021-06-08 NOTE — Telephone Encounter (Signed)
I spoke with [facility] and confirmed the patient has a year's worth of refills on file.  They will fill this for the patient and make a note that he has plenty of refills.  I already had responded to patient's MyChart message stating he had refills on file but he has not read this message yet.  The pharmacy told me they will take care of it.

## 2021-06-09 ENCOUNTER — Telehealth: Payer: Self-pay | Admitting: Neurology

## 2021-06-09 NOTE — Telephone Encounter (Signed)
[facility] Jinny Sanders) Brain lab sinus only done at the hospital. If have any questions can call me back.

## 2021-06-14 NOTE — Telephone Encounter (Signed)
Noted  

## 2021-06-16 ENCOUNTER — Other Ambulatory Visit: Payer: Self-pay | Admitting: Neurology

## 2021-06-16 DIAGNOSIS — G9601 Cranial cerebrospinal fluid leak, spontaneous: Secondary | ICD-10-CM

## 2021-06-16 DIAGNOSIS — R51 Headache with orthostatic component, not elsewhere classified: Secondary | ICD-10-CM

## 2021-06-28 NOTE — Telephone Encounter (Signed)
Noted, [employer] [place] pending faxed notes to [facility]

## 2021-06-29 NOTE — Telephone Encounter (Signed)
I called the pt and LVM (ok per DPR) asking for call back to discuss the test and the plan of care. Advised insurance isn't approving the test right now and we need to refer to ENT. Left office number for call back to discuss.

## 2021-06-30 NOTE — Telephone Encounter (Signed)
I called pt LMVM for him to return call concerning test that we wanted to do but will need to go another specialty to check on this. 2nd message.

## 2021-07-04 ENCOUNTER — Encounter: Payer: Self-pay | Admitting: *Deleted

## 2021-07-04 NOTE — Telephone Encounter (Signed)
LMVM for pt to return call (3rd message) relating to recommendations per Dr. Jaynee Eagles.  Will mail and also mchart.  Per Dr. Jaynee Eagles:  "We are looking for a CSF leak in his sinuses, not an infection. I would let patient know insurance won't let us do the study to look for fluid leaking out of his nose, he has to see ENT and we can refer so they can establish whether he has a csf leak from his nasal cavity. If he does not have that, we will look at the rest of his spine for a leak but we can refer to ENT. Does he have one he is established with?"

## 2021-07-07 DIAGNOSIS — L4050 Arthropathic psoriasis, unspecified: Secondary | ICD-10-CM | POA: Diagnosis not present

## 2021-07-07 DIAGNOSIS — J321 Chronic frontal sinusitis: Secondary | ICD-10-CM | POA: Insufficient documentation

## 2021-07-07 DIAGNOSIS — R202 Paresthesia of skin: Secondary | ICD-10-CM | POA: Insufficient documentation

## 2021-07-07 DIAGNOSIS — M5000 Cervical disc disorder with myelopathy, unspecified cervical region: Secondary | ICD-10-CM | POA: Insufficient documentation

## 2021-07-07 DIAGNOSIS — Z79899 Other long term (current) drug therapy: Secondary | ICD-10-CM | POA: Diagnosis not present

## 2021-07-07 DIAGNOSIS — M65841 Other synovitis and tenosynovitis, right hand: Secondary | ICD-10-CM | POA: Diagnosis not present

## 2021-07-07 DIAGNOSIS — M79644 Pain in right finger(s): Secondary | ICD-10-CM | POA: Diagnosis not present

## 2021-07-11 ENCOUNTER — Other Ambulatory Visit: Payer: Self-pay | Admitting: Cardiovascular Disease

## 2021-07-18 DIAGNOSIS — Z79899 Other long term (current) drug therapy: Secondary | ICD-10-CM | POA: Diagnosis not present

## 2021-07-18 DIAGNOSIS — G43119 Migraine with aura, intractable, without status migrainosus: Secondary | ICD-10-CM | POA: Diagnosis not present

## 2021-07-18 DIAGNOSIS — G43719 Chronic migraine without aura, intractable, without status migrainosus: Secondary | ICD-10-CM | POA: Diagnosis not present

## 2021-07-18 DIAGNOSIS — Z049 Encounter for examination and observation for unspecified reason: Secondary | ICD-10-CM | POA: Diagnosis not present

## 2021-07-19 ENCOUNTER — Telehealth: Payer: Self-pay

## 2021-07-19 NOTE — Telephone Encounter (Signed)
I submitted a PA request for Emgality on CMM, Key: [id number]. Awaiting determination from [employer].

## 2021-07-19 NOTE — Telephone Encounter (Signed)
Request Reference Number: [id number]. EMGALITY INJ 120MG /ML is approved through 07/19/2022.

## 2021-08-03 DIAGNOSIS — G43719 Chronic migraine without aura, intractable, without status migrainosus: Secondary | ICD-10-CM | POA: Diagnosis not present

## 2021-08-03 DIAGNOSIS — G43119 Migraine with aura, intractable, without status migrainosus: Secondary | ICD-10-CM | POA: Diagnosis not present

## 2021-08-03 DIAGNOSIS — G518 Other disorders of facial nerve: Secondary | ICD-10-CM | POA: Diagnosis not present

## 2021-08-03 DIAGNOSIS — M542 Cervicalgia: Secondary | ICD-10-CM | POA: Diagnosis not present

## 2021-08-03 DIAGNOSIS — M791 Myalgia, unspecified site: Secondary | ICD-10-CM | POA: Diagnosis not present

## 2021-08-17 ENCOUNTER — Other Ambulatory Visit: Payer: Self-pay

## 2021-08-17 ENCOUNTER — Ambulatory Visit: Payer: BC Managed Care – PPO | Admitting: Physical Medicine and Rehabilitation

## 2021-08-17 ENCOUNTER — Ambulatory Visit: Payer: Self-pay

## 2021-08-17 ENCOUNTER — Ambulatory Visit (INDEPENDENT_AMBULATORY_CARE_PROVIDER_SITE_OTHER): Payer: BC Managed Care – PPO | Admitting: Orthopaedic Surgery

## 2021-08-17 DIAGNOSIS — M25512 Pain in left shoulder: Secondary | ICD-10-CM

## 2021-08-17 DIAGNOSIS — G8929 Other chronic pain: Secondary | ICD-10-CM | POA: Diagnosis not present

## 2021-08-17 DIAGNOSIS — M12812 Other specific arthropathies, not elsewhere classified, left shoulder: Secondary | ICD-10-CM

## 2021-08-17 MED ORDER — TRIAMCINOLONE ACETONIDE 40 MG/ML IJ SUSP
40.0000 mg | INTRAMUSCULAR | Status: AC | PRN
  Administered 2021-08-17: 40 mg via INTRA_ARTICULAR

## 2021-08-17 MED ORDER — BUPIVACAINE HCL 0.25 % IJ SOLN
5.0000 mL | INTRAMUSCULAR | Status: AC | PRN
  Administered 2021-08-17: 5 mL via INTRA_ARTICULAR

## 2021-08-17 NOTE — Progress Notes (Signed)
Office Visit Note   Patient: [name]           Date of Birth: 21-Feb-1951           MRN: [id number] Visit Date: 08/17/2021              Requested by: Seth Macadam, MD [place],  [place] [address] PCP: Seth Macadam, MD   Assessment & Plan: Visit Diagnoses:  1. Rotator cuff arthropathy of left shoulder   2. Chronic left shoulder pain     Plan: [name] returns today for recurrent left shoulder pain.  He has rotator cuff arthropathy.  His last cortisone injection was 11 months ago by [name].  He states that the injection really improved his pain and he still has very good function most of the time.  He started noticing worsening pain recently.  He would like to try another injection.  Left shoulder shows well compensated for flexion and abduction.  Range of motion is nearly normal.  Impression is left shoulder rotator cuff arthropathy.  He is still getting good relief from cortisone injections and he would like to have this repeated today.  Will check to see if [name] can get him in today.  If not he will make an appointment to see [name] for this injection.  Follow-Up Instructions: No follow-ups on file.   Orders:  Orders Placed This Encounter  Procedures   XR Shoulder Left   No orders of the defined types were placed in this encounter.     Procedures: No procedures performed   Clinical Data: No additional findings.   Subjective: Chief Complaint  Patient presents with   Left Shoulder - Pain    HPI  Review of Systems   Objective: Vital Signs: There were no vitals taken for this visit.  Physical Exam  Ortho Exam  Specialty Comments:  No specialty comments available.  Imaging: XR Shoulder Left  Result Date: 08/17/2021 Superior migration of humeral head and decreased acromiohumeral distance consistent with rotator cuff arthropathy.  Resorption of greater tuberosity    PMFS History: Patient Active Problem List    Diagnosis Date Noted   Rotator cuff arthropathy of left shoulder 08/17/2021   Chronic left shoulder pain 08/17/2021   Cervical stenosis of spinal canal 06/07/2021   Meningeal irritation 06/07/2021   Idiopathic intracranial hypotension 06/07/2021   Cervicalgia 03/07/2021   Chronic daily headache 03/07/2021   Renal stones 10/04/2020   Type 2 diabetes mellitus with unspecified complications ([place]) 82/99/3716   Benign prostatic hyperplasia without lower urinary tract symptoms 09/30/2020   GERD (gastroesophageal reflux disease) 05/05/2020   Irritable bowel syndrome 02/19/2020   Dependence on other enabling machines and devices 02/19/2020   ED (erectile dysfunction) of organic origin 02/19/2020   Major depression single episode, in partial remission ([place]) 02/19/2020   Obesity 02/19/2020   Chronic right-sided low back pain without sciatica 12/16/2019   Primary osteoarthritis of left knee 09/16/2019   Rotator cuff tear arthropathy of left shoulder 09/16/2019   Primary osteoarthritis of right knee 09/16/2019   Complex tear of medial meniscus of right knee as current injury 03/04/2019   Chronic diastolic heart failure ([place]) 11/25/2018   Bruxism (teeth grinding) 11/25/2018   Cephalalgia 11/25/2018   Cerebrovascular disease 11/25/2018   Morning headache 11/25/2018   Sleep related headaches 09/26/2018   Chronic idiopathic gout without tophus 02/13/2018   Hypertension    History of adenomatous polyp of colon 03/14/2016   Thrombosed  hemorrhoids 03/01/2016   Encounter for long-term (current) use of high-risk medication 06/30/2015   Psoriasis 12/25/2014   Major depressive disorder, recurrent, in full remission ([place]) 06/22/2014   Gout 04/21/2014   Chest pain, somewhat atypical 12/28/2013   Dyspnea on exertion 12/27/2011   Dizziness 08/15/2011   Heart valve replaced by other means 07/28/2011   Aortic stenosis, severe 07/18/2011   Coronary artery disease 07/06/2011   Peripheral vascular disease  ([place]) 07/06/2011   Hyperlipidemia 07/06/2011   Bicuspid aortic valve 07/06/2011   Past Medical History:  Diagnosis Date   Aortic stenosis    a. severe bicuspid AV stenosis s/p pericardial tissue valve 2012.   Arthritis    Coronary artery disease    a. s/p CABG (LIMA-LCx) at time of AVR 2012, mild nonobstructive LAD and RCA stenoses.   Depression    Depression    Diabetes mellitus without complication [facility])    ED (erectile dysfunction)    Fatty liver    Femoral-popliteal bypass graft occlusion, right [facility]) 1997   Dr. 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   Obesity    PAD (peripheral artery disease) ([facility])    PONV (postoperative nausea and vomiting)    PVD (peripheral vascular disease) ([place])     Family History  Problem Relation Age of Onset   Hypertension Father    Diabetes Father    Lung cancer Father    Breast cancer Sister    Healthy Brother    Heart disease Maternal Grandmother    Healthy Maternal Grandfather    Cancer Maternal Grandfather    Migraines Neg Hx    Headache Neg Hx    Colon cancer Neg Hx    Pancreatic cancer Neg Hx    Stomach cancer Neg Hx    Esophageal cancer Neg Hx     Past Surgical History:  Procedure Laterality Date   AORTIC VALVE REPLACEMENT  07/18/2011   Procedure: AORTIC VALVE REPLACEMENT (AVR);  Surgeon: Grace Isaac, MD;  Location: [place];  Service: Open Heart Surgery;  Laterality: N/A;   CARDIAC CATHETERIZATION  2009, 07/11/11   Dr. Irish Lack   CORONARY ARTERY BYPASS GRAFT  07/18/2011   Procedure: CORONARY ARTERY BYPASS GRAFTING (CABG);  Surgeon: Grace Isaac, MD;  Location: [place];  Service: Open Heart Surgery;   Laterality: N/A;  times one to mammary artery, left   fem stent Right 2017   FEMORAL ARTERY - POPLITEAL ARTERY BYPASS GRAFT     KNEE ARTHROSCOPY     left   MRI     to visualize aortic valve   PERIPHERAL VASCULAR CATHETERIZATION N/A 07/12/2016   Procedure: Abdominal Aortogram w/Lower Extremity;  Surgeon: Waynetta Sandy, MD;  Location: [place] [facility];  Service: Cardiovascular;  Laterality: N/A;   PERIPHERAL VASCULAR CATHETERIZATION Right 07/12/2016   Procedure: Peripheral Vascular Atherectomy;  Surgeon: Waynetta Sandy, MD;  Location: [place] [facility];  Service: Cardiovascular;  Laterality: Right;  Anterior Tibial and Popliteal   ROTATOR CUFF REPAIR     Left   US ECHOCARDIOGRAPHY  Social History   Occupational History   Occupation: Horticulturist, commercial: Research scientist (life sciences)   Tobacco Use   Smoking status: Never   Smokeless tobacco: Never  Vaping Use   Vaping Use: Never used  Substance and Sexual Activity   Alcohol use: No   Drug use: No   Sexual activity: Not on file

## 2021-08-17 NOTE — Progress Notes (Signed)
° °  [name] - 71 y.o. male MRN [id number]  Date of birth: November 09, 1950  Office Visit Note: Visit Date: 08/17/2021 PCP: Caren Macadam, MD Referred by: Caren Macadam, MD  Subjective: Chief Complaint  Patient presents with   Left Shoulder - Pain   HPI:  [name] is a 71 y.o. male who comes in today at the request of Dr. Eduard Roux for planned Left anesthetic glenohumeral arthrogram with fluoroscopic guidance.  The patient has failed conservative care including home exercise, medications, time and activity modification.  This injection will be diagnostic and hopefully therapeutic.  Please see requesting physician notes for further details and justification.   ROS Otherwise per HPI.  Assessment & Plan: Visit Diagnoses:    ICD-10-CM   1. Rotator cuff arthropathy of left shoulder  M12.812     2. Chronic left shoulder pain  M25.512 XR Shoulder Left   G89.29       Plan: No additional findings.   Meds & Orders: No orders of the defined types were placed in this encounter.   Orders Placed This Encounter  Procedures   XR Shoulder Left    Follow-up: As needed  Procedures: Large Joint Inj: L glenohumeral on 08/17/2021 9:58 AM Indications: pain and diagnostic evaluation Details: 22 G 3.5 in needle, fluoroscopy-guided anteromedial approach  Arthrogram: No  Medications: 40 mg triamcinolone acetonide 40 MG/ML; 5 mL bupivacaine 0.25 % Outcome: tolerated well, no immediate complications  There was excellent flow of contrast producing a partial arthrogram of the glenohumeral joint. The patient did have relief of symptoms during the anesthetic phase of the injection. Procedure, treatment alternatives, risks and benefits explained, specific risks discussed. Consent was given by the patient. Immediately prior to procedure a time out was called to verify the correct patient, procedure, equipment, support staff and site/side marked as required. Patient was prepped and draped in the  usual sterile fashion.         Clinical History: No specialty comments available.     Objective:  VS:  HT:     WT:    BMI:      BP:    HR: bpm   TEMP: ( )   RESP:  Physical Exam   Imaging: XR Shoulder Left  Result Date: 08/17/2021 Superior migration of humeral head and decreased acromiohumeral distance consistent with rotator cuff arthropathy.  Resorption of greater tuberosity

## 2021-08-22 DIAGNOSIS — M791 Myalgia, unspecified site: Secondary | ICD-10-CM | POA: Diagnosis not present

## 2021-08-22 DIAGNOSIS — M542 Cervicalgia: Secondary | ICD-10-CM | POA: Diagnosis not present

## 2021-08-22 DIAGNOSIS — G518 Other disorders of facial nerve: Secondary | ICD-10-CM | POA: Diagnosis not present

## 2021-08-22 DIAGNOSIS — G43119 Migraine with aura, intractable, without status migrainosus: Secondary | ICD-10-CM | POA: Diagnosis not present

## 2021-08-22 DIAGNOSIS — G43719 Chronic migraine without aura, intractable, without status migrainosus: Secondary | ICD-10-CM | POA: Diagnosis not present

## 2021-09-08 DIAGNOSIS — M542 Cervicalgia: Secondary | ICD-10-CM | POA: Diagnosis not present

## 2021-09-08 DIAGNOSIS — G518 Other disorders of facial nerve: Secondary | ICD-10-CM | POA: Diagnosis not present

## 2021-09-08 DIAGNOSIS — M791 Myalgia, unspecified site: Secondary | ICD-10-CM | POA: Diagnosis not present

## 2021-09-08 DIAGNOSIS — G43719 Chronic migraine without aura, intractable, without status migrainosus: Secondary | ICD-10-CM | POA: Diagnosis not present

## 2021-09-08 DIAGNOSIS — G43119 Migraine with aura, intractable, without status migrainosus: Secondary | ICD-10-CM | POA: Diagnosis not present

## 2021-09-12 ENCOUNTER — Other Ambulatory Visit: Payer: Self-pay | Admitting: Cardiovascular Disease

## 2021-09-21 DIAGNOSIS — G43119 Migraine with aura, intractable, without status migrainosus: Secondary | ICD-10-CM | POA: Diagnosis not present

## 2021-09-21 DIAGNOSIS — G518 Other disorders of facial nerve: Secondary | ICD-10-CM | POA: Diagnosis not present

## 2021-09-21 DIAGNOSIS — M791 Myalgia, unspecified site: Secondary | ICD-10-CM | POA: Diagnosis not present

## 2021-09-21 DIAGNOSIS — G43719 Chronic migraine without aura, intractable, without status migrainosus: Secondary | ICD-10-CM | POA: Diagnosis not present

## 2021-09-21 DIAGNOSIS — M542 Cervicalgia: Secondary | ICD-10-CM | POA: Diagnosis not present

## 2021-10-05 DIAGNOSIS — G518 Other disorders of facial nerve: Secondary | ICD-10-CM | POA: Diagnosis not present

## 2021-10-05 DIAGNOSIS — G43119 Migraine with aura, intractable, without status migrainosus: Secondary | ICD-10-CM | POA: Diagnosis not present

## 2021-10-05 DIAGNOSIS — M791 Myalgia, unspecified site: Secondary | ICD-10-CM | POA: Diagnosis not present

## 2021-10-05 DIAGNOSIS — M542 Cervicalgia: Secondary | ICD-10-CM | POA: Diagnosis not present

## 2021-10-05 DIAGNOSIS — G43719 Chronic migraine without aura, intractable, without status migrainosus: Secondary | ICD-10-CM | POA: Diagnosis not present

## 2021-10-19 DIAGNOSIS — G518 Other disorders of facial nerve: Secondary | ICD-10-CM | POA: Diagnosis not present

## 2021-10-19 DIAGNOSIS — M542 Cervicalgia: Secondary | ICD-10-CM | POA: Diagnosis not present

## 2021-10-19 DIAGNOSIS — L821 Other seborrheic keratosis: Secondary | ICD-10-CM | POA: Diagnosis not present

## 2021-10-19 DIAGNOSIS — L819 Disorder of pigmentation, unspecified: Secondary | ICD-10-CM | POA: Diagnosis not present

## 2021-10-19 DIAGNOSIS — M791 Myalgia, unspecified site: Secondary | ICD-10-CM | POA: Diagnosis not present

## 2021-10-19 DIAGNOSIS — D225 Melanocytic nevi of trunk: Secondary | ICD-10-CM | POA: Diagnosis not present

## 2021-10-19 DIAGNOSIS — G43719 Chronic migraine without aura, intractable, without status migrainosus: Secondary | ICD-10-CM | POA: Diagnosis not present

## 2021-10-19 DIAGNOSIS — L570 Actinic keratosis: Secondary | ICD-10-CM | POA: Diagnosis not present

## 2021-10-19 DIAGNOSIS — L814 Other melanin hyperpigmentation: Secondary | ICD-10-CM | POA: Diagnosis not present

## 2021-10-25 ENCOUNTER — Ambulatory Visit (INDEPENDENT_AMBULATORY_CARE_PROVIDER_SITE_OTHER): Payer: BC Managed Care – PPO | Admitting: Neurology

## 2021-10-25 ENCOUNTER — Encounter: Payer: Self-pay | Admitting: Neurology

## 2021-10-25 VITALS — BP 130/76 | HR 82 | Ht 72.0 in | Wt 216.4 lb

## 2021-10-25 DIAGNOSIS — M5134 Other intervertebral disc degeneration, thoracic region: Secondary | ICD-10-CM | POA: Diagnosis not present

## 2021-10-25 DIAGNOSIS — G9600 Cerebrospinal fluid leak, unspecified: Secondary | ICD-10-CM | POA: Diagnosis not present

## 2021-10-25 DIAGNOSIS — M4802 Spinal stenosis, cervical region: Secondary | ICD-10-CM | POA: Diagnosis not present

## 2021-10-25 DIAGNOSIS — R51 Headache with orthostatic component, not elsewhere classified: Secondary | ICD-10-CM | POA: Diagnosis not present

## 2021-10-25 DIAGNOSIS — M48061 Spinal stenosis, lumbar region without neurogenic claudication: Secondary | ICD-10-CM

## 2021-10-25 NOTE — Patient Instructions (Addendum)
-   Chronic Enhancement of the pachymeninges on MRI brain, likely chronic intracranial hypotension(fluid leak from around the brain or spine). Given intractable headaches and fluid leak from nose, ordered prone CT maxillofacial and brain and CT brain lab and he did not have it done, to look for csf fluid leak (he describes nasal drainage)and if negative then order  Reorder. I've reached out to Dr. Jobe Igo at [facility] imaging who suggested prone CT sinuses to look for fluid. prone CT sinuses to look for csf fluid leak (nasal drainage,intractable headaches, and signs of intracranial hypotension on MRI brain) we will reorder ? ?- Severe cervical stenosis at C3/C4, I have encouraged him to f/u with Neurosurgery who has been recommending decompression and he has appointment soon per patient; this could be where the csf leak is , CT Myelogram if nothing is seen leaking from brain ? ?- He states he is compliant with CPAP (AHI 52) for his severe sleep apnea. The cpap helped with morning and nocturnal headaches but he is still with headaches in the day after getting up and still continues to have chronic daily positional headaches  ? ?- He has stopped taking daily analgesics to avoid medication overuse headaches ? ?- We have tried multiple migraine medications including the new CGRP medications(Ajovy, Emgality, Nurtec etc) to no avail, nerve blocks, oral meds ? ?- If above is not diagnostic we will have to send to [facility] to Lourena Simmonds she is premier neurologist in csf leaks ?

## 2021-10-25 NOTE — Progress Notes (Signed)
?[facility] ? ? ? ?Provider:  Dr Jaynee Eagles ?Requesting Provider: Caren Macadam, MD ?Primary Care Provider:  Caren Macadam, MD ? ?CC:  headaches ? ?10/25/2021: [facility] and had nerve blocks, he stopped all his medications per Dr. Domingo Cocking. I ordered CT maxillofacial and csf brain lab and he did not comply(to find csf leak). He tried zonisamide and a muscle relaxer and that did not help. He has not followed up with neurosurgery for his severe cervical stenosis - csf leak could be in the brain or in the spine. but he says he has a follow up with neurosugery. He says he is compliant with cpap, his OSA AHI 52, he is fine laying down and worse when standing. Chronic Enhancement of the pachymeninges on MRI brain, likely chronic intracranial hypotension. Has dull pain with cough or sneezing. Had to check to see if his imaging was still approved, may need to rerder, calling now. Told him he had to get the imaging completed when we order it and likely the CT Myelogram ? ?Patient complains of symptoms per HPI as well as the following symptoms: positional headaches . Pertinent negatives and positives per HPI. All others negative ? ? ? ?Interval history May 30, 2021: Patient here for chronic daily headaches.  He has obstructive sleep apnea AHI of 52 seen in 2020, using CPAP help with his nocturnal and morning headaches but did not help with his daily or exertional headaches, behind the left eye, also with a component of rebound headache, medication overuse headache.  Patient had an occipital nerve block on March 10, 2021.  His insurance did not cover Ajovy so he was prescribed Emgality in August 2022.He follows with [place] neurosurgery.  ? ?Emgality did not help. He is using is cpap, still having daily headaches, his worse time is if he sneezes.  ? ?Treating his headaches with medication and CGRP medications have not helped. Treating his OSA with cpap only helped his  nocturnal and morning headaches. MRI showed possible low pressure headache(slight enhancement of the pachymeninges stable from 2020). MRI cervical spine in the past showed stenosis, need to ensure he was seen by neurosurgery.  ? ?HPI 02/16/2021:  [name] is a 71 y.o. male here as requested by Caren Macadam, MD for migraines.  Past medical history of open heart surgery, obstructive sleep apnea unknown compliance, diabetes, migraines, headache, hypertension.  Imaging performed 03/17/2021: MRI of the head was normal (personally reviewed images and agree) MRI of the brain showed scattered T2/FLAIR hyperintense foci in the hemispheres consistent with minimal chronic microvascular ischemic changes, they do not enhance, more than typical enhancement of the packing meninges which may cause intracranial hypotension, similar to March 2020. ? ?He is here again for chronic headaches behind the left eye. When he sneezes or cough it hurts. Saw him initially in 2020 and ordered MRI brain and sleep study, when we found OSA, he has been compliant, the headaches are always there 24x7, always behind the left eye, cpap may have helped the nocturnal and morning headaches but he still has daily chronic headaches. Very bad after he sneezes. When he has the headaches, he can have nausea, he has been taking daily tylenol but he knows about medication rebound, he has a history of migraines. Does not get worse standing for long periods, not positional in nature but it is exertional, when he lays down on his right  it helps, always on the left, pressure, pulsating, pounding, he  as some vision changes but doesn't have auras anymore like used. Seems to always be in the left eye.  ? ?Medications tried: metoprolol, depo-medrol, nortriptyline, topamax, amitriptyline,imitrex,maxalt ? ?MRI brain 10/11/2018: ?The brain parenchyma shows only minor age-appropriate changes of chronic microvascular ischemia. No structural lesion, tumor infarcts are  noted. ?No abnormal lesions are seen on diffusion-weighted views to suggest acute ischemia. The cortical sulci, fissures and cisterns are normal in size and appearance. Lateral, third and fourth ventricle are normal in size and appearance. No extra-axial fluid collections are seen. No evidence of mass effect or midline shift.  No abnormal lesions are seen on post contrast views.  On sagittal views the posterior fossa, pituitary gland and corpus callosum are unremarkable. No evidence of intracranial hemorrhage on gradient-echo views. The orbits and their contents, paranasal sinuses and calvarium are unremarkable.  Intracranial flow voids are present. ?  ?  ?  ?IMPRESSION:  Unremarkable MRI scan of the brain with and without contrast. Personally reviewed and agreed ? ?AHI 52 2020 tried cpap ? ?I reviewed Dr. Janeice Robinson notes from ENT, patient was seen in June of this year, for the past 10 years after undergoing open heart surgery he has had chronic almost daily left frontal headaches, CT of the sinuses 2 years ago was completely negative, he does have a history of migraine when he was younger, they were migraines with aura and he no longer has the aura he has some GI symptoms associated with the headaches and also suffers with sleep apnea and was prescribed CPAP about a year ago.  Dr. Janeice Robinson general exam was normal, diagnosis not sinus in origin likely migraine related, and he was referred to Dr. Redmond Baseman for possible candidacy for inspire implant. ? ?Reviewed notes, labs and imaging from outside physicians, which showed: ? ?Labs July 2020 to include normal CBC, CMP with creatinine 1.12, BUN 18 otherwise unremarkable, ? ?FINDINGS: MRI Cervical spine 10/2020 ?Alignment: Normal except for 1 mm of anterolisthesis at C7-T1. ?  ?Vertebrae: No fracture or primary bone lesion. See below regarding ?facet arthropathy on the right at C3-4. ?  ?Cord: No cord compression or primary cord lesion. See below ?regarding stenosis at C3-4. ?   ?Posterior Fossa, vertebral arteries, paraspinal tissues: Negative ?  ?Disc levels: ?  ?Foramen magnum is widely patent. C1-2 articulation shows mild ?osteoarthritis but no encroachment upon the neural structures. ?  ?C2-3: Spondylosis with endplate osteophytes and protruding disc ?material more prominent towards the left. Narrowing of the ventral ?subarachnoid space but no compression of the cord. Foraminal ?narrowing on the left that could possibly affect the left C3 nerve. ?  ?C3-4: Spondylosis with endplate osteophytes and shallow protrusion ?of the disc. Facet degeneration and hypertrophy, worse on the right, ?with edema on the right. There is canal narrowing with AP diameter ?in the midline measuring 7.7 mm. The subarachnoid space surrounding ?the cord is effaced but the cord is not frankly compressed. ?Bilateral foraminal stenosis could affect either C4 nerve. Edematous ?facet arthropathy on the right could certainly be symptomatic. ?  ?C4-5: Endplate osteophytes and bulging of the disc. Mild narrowing ?of the ventral subarachnoid space but no compressive narrowing of ?the canal. Mild facet hypertrophy. Mild bilateral foraminal ?narrowing, not grossly compressive. ?  ?C5-6: Spondylosis with uncovertebral hypertrophy on the left. Facet ?degeneration and hypertrophy on the left. No compressive canal ?stenosis. Left foraminal stenosis could compress the left C6 nerve. ?  ?C6-7: Left-sided predominant uncovertebral prominence and disc ?bulge. Mild left foraminal narrowing, not  definitely compressive. ?  ?C7-T1: Facet osteoarthritis on the left allowing 1 mm of ?anterolisthesis. Left foraminal narrowing that could affect the left ?C8 nerve. ?  ?IMPRESSION: ?In this patient with right-sided symptoms, the most concerning ?findings are at the C3-4 level. There is spondylosis with endplate ?osteophytes and shallow protrusion of disc material resulting in ?canal narrowing with AP diameter in the midline of only 7.7  mm. The ?subarachnoid space is effaced but the cord is not frankly ?compressed. There is bilateral facet degeneration and hypertrophy, ?with edematous change on the right. There is bilateral foraminal ?stenos

## 2021-10-26 LAB — BASIC METABOLIC PANEL
BUN/Creatinine Ratio: 15 (ref 10–24)
BUN: 17 mg/dL (ref 8–27)
CO2: 18 mmol/L — ABNORMAL LOW (ref 20–29)
Calcium: 9.2 mg/dL (ref 8.6–10.2)
Chloride: 109 mmol/L — ABNORMAL HIGH (ref 96–106)
Creatinine, Ser: 1.1 mg/dL (ref 0.76–1.27)
Glucose: 99 mg/dL (ref 70–99)
Potassium: 4.2 mmol/L (ref 3.5–5.2)
Sodium: 142 mmol/L (ref 134–144)
eGFR: 73 mL/min/{1.73_m2} (ref >59)

## 2021-11-02 ENCOUNTER — Telehealth: Payer: Self-pay | Admitting: Neurology

## 2021-11-02 NOTE — Telephone Encounter (Signed)
Medicare/[employer] [place] pending faxed notes to NIA  ?

## 2021-11-03 NOTE — Telephone Encounter (Signed)
Patient insurance [employer] did not approve the CT's.[name] NIA said that the next option is to do a peer to peer.. I did call them to make sure they received my notes and they Harmon Pier stated that they did receive the clinical notes.  ? ?They didn't really give me a reason on the denial other than she said there is a 180 days tto do a peer to peer. ? ?The tracking number is [id number] for all three CT's. The phone number to call is [phone]. ?

## 2021-11-07 ENCOUNTER — Other Ambulatory Visit: Payer: Self-pay | Admitting: Cardiovascular Disease

## 2021-11-09 NOTE — Telephone Encounter (Signed)
Patient left me a voicemail stating he got a letter in the mail from his insurance about the denial's for the CT's.  ? ?I called him back and had to leave him a voicemail I informed him in the message that I sent everything over from Dr. Jaynee Eagles notes and Dr. Jaynee Eagles is out of the office this week once she gets back I will see how she wants to proceed.  ?

## 2021-11-11 ENCOUNTER — Other Ambulatory Visit: Payer: Self-pay | Admitting: *Deleted

## 2021-11-11 DIAGNOSIS — I739 Peripheral vascular disease, unspecified: Secondary | ICD-10-CM

## 2021-11-11 DIAGNOSIS — I779 Disorder of arteries and arterioles, unspecified: Secondary | ICD-10-CM

## 2021-11-16 ENCOUNTER — Ambulatory Visit (HOSPITAL_COMMUNITY)
Admission: RE | Admit: 2021-11-16 | Discharge: 2021-11-16 | Disposition: A | Discharge status: Home / Self Care | Payer: BC Managed Care – PPO | Source: Ambulatory Visit | Attending: Vascular Surgery | Admitting: Vascular Surgery

## 2021-11-16 ENCOUNTER — Ambulatory Visit: Payer: BC Managed Care – PPO | Admitting: Physician Assistant

## 2021-11-16 ENCOUNTER — Ambulatory Visit (INDEPENDENT_AMBULATORY_CARE_PROVIDER_SITE_OTHER)
Admission: RE | Admit: 2021-11-16 | Discharge: 2021-11-16 | Disposition: A | Discharge status: Home / Self Care | Payer: BC Managed Care – PPO | Source: Ambulatory Visit | Attending: Vascular Surgery | Admitting: Vascular Surgery

## 2021-11-16 VITALS — BP 116/75 | HR 86 | Temp 98.0°F | Resp 20 | Ht 72.0 in | Wt 219.1 lb

## 2021-11-16 DIAGNOSIS — I739 Peripheral vascular disease, unspecified: Secondary | ICD-10-CM | POA: Diagnosis not present

## 2021-11-16 DIAGNOSIS — I779 Disorder of arteries and arterioles, unspecified: Secondary | ICD-10-CM | POA: Insufficient documentation

## 2021-11-16 DIAGNOSIS — R9389 Abnormal findings on diagnostic imaging of other specified body structures: Secondary | ICD-10-CM | POA: Insufficient documentation

## 2021-11-16 NOTE — Progress Notes (Signed)
?Office Note  ? ? ? ?CC:  follow up ?Requesting Provider:  Caren Macadam, MD ? ?HPI: [name] is a 71 y.o. (1951-04-19) male who presents for surveillance of PAD.  He has history of atherectomy of right AT and popliteal artery as well as drug-coated balloon angioplasty of right popliteal artery by Dr. Donzetta Matters on 07/12/2016.  He previously underwent right SFA to below the knee popliteal bypass with left greater saphenous vein in 1997 by Dr. Kellie Simmering however the bypass was noted to be occluded in 2008.  He denies any rest pain, or nonhealing wounds of bilateral lower extremities.  He does have some calf cramping on the right leg however symptoms are not severe enough to stop walking or prevent him from performing day-to-day activities.  He is on aspirin, statin, Pletal daily.  He denies tobacco use. ? ? ?Past Medical History:  ?Diagnosis Date  ? Aortic stenosis   ? a. severe bicuspid AV stenosis s/p pericardial tissue valve 2012.  ? Arthritis   ? Coronary artery disease   ? a. s/p CABG (LIMA-LCx) at time of AVR 2012, mild nonobstructive LAD and RCA stenoses.  ? Depression   ? Depression   ? Diabetes mellitus without complication ([place])   ? ED (erectile dysfunction)   ? Fatty liver   ? Femoral-popliteal bypass graft occlusion, right ([place]) 1997  ? Dr. Kellie Simmering  ? GERD (gastroesophageal reflux disease)   ? Gout   ? H/O migraine   ? up to his 32s  ? Headache(784.0)   ? Hematuria   ? negative workup  ? History of colonic polyps   ? Hyperlipemia, mixed   ? Hyperlipidemia   ? Hypertension   ? Irritable bowel syndrome 02/19/2020  ? Migraines   ? Near syncope   ? 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  ? Obesity   ? Obstructive sleep apnea 01/10/2021  ? PAD (peripheral artery disease) ([place])   ? PONV (postoperative nausea and vomiting)   ? PVD (peripheral vascular disease) ([place])   ? ? ?Past Surgical History:  ?Procedure Laterality Date  ? AORTIC VALVE REPLACEMENT  07/18/2011  ? Procedure: AORTIC VALVE  REPLACEMENT (AVR);  Surgeon: Grace Isaac, MD;  Location: [facility];  Service: Open Heart Surgery;  Laterality: N/A;  ? CARDIAC CATHETERIZATION  2009, 07/11/11  ? Dr. Irish Lack  ? CORONARY ARTERY BYPASS GRAFT  07/18/2011  ? Procedure: CORONARY ARTERY BYPASS GRAFTING (CABG);  Surgeon: Grace Isaac, MD;  Location: [facility];  Service: Open Heart Surgery;  Laterality: N/A;  times one to mammary artery, left  ? fem stent Right 2017  ? FEMORAL ARTERY - POPLITEAL ARTERY BYPASS GRAFT    ? KNEE ARTHROSCOPY    ? left  ? MRI    ? to visualize aortic valve  ? PERIPHERAL VASCULAR CATHETERIZATION N/A 07/12/2016  ? Procedure: Abdominal Aortogram w/Lower Extremity;  Surgeon: Waynetta Sandy, MD;  Location: [facility];  Service: Cardiovascular;  Laterality: N/A;  ? PERIPHERAL VASCULAR CATHETERIZATION Right 07/12/2016  ? Procedure: Peripheral Vascular Atherectomy;  Surgeon: Waynetta Sandy, MD;  Location: [facility];  Service: Cardiovascular;  Laterality: Right;  Anterior Tibial and Popliteal  ? ROTATOR CUFF REPAIR    ? Left  ? US ECHOCARDIOGRAPHY    ? ? ?Social History  ? ?Socioeconomic History  ? Marital status: Divorced  ?  Spouse name: Not on file  ? Number of children: 4  ? Years of education:  Not on file  ? Highest education level: Bachelor's degree (e.g., BA, AB, BS)  ?Occupational History  ? Occupation: [employer]  ?  Employer: [employer]   ?Tobacco Use  ? Smoking status: Never  ?  Passive exposure: Never  ? Smokeless tobacco: Never  ?Vaping Use  ? Vaping Use: Never used  ?Substance and Sexual Activity  ? Alcohol use: No  ? Drug use: No  ? Sexual activity: Not on file  ?Other Topics Concern  ? Not on file  ?Social History Narrative  ? 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 Left handed  ? Caffeine: about 1 cups daily  ? ?Social Determinants of Health  ? ?Financial Resource Strain: Not on file  ?Food Insecurity: Not on file  ?Transportation Needs: Not on file  ?Physical Activity: Not on  file  ?Stress: Not on file  ?Social Connections: Not on file  ?Intimate Partner Violence: Not on file  ? ? ?Family History  ?Problem Relation Age of Onset  ? Hypertension Father   ? Diabetes Father   ? Lung cancer Father   ? Breast cancer Sister   ? Healthy Brother   ? Heart disease Maternal Grandmother   ? Healthy Maternal Grandfather   ? Cancer Maternal Grandfather   ? Migraines Neg Hx   ? Headache Neg Hx   ? Colon cancer Neg Hx   ? Pancreatic cancer Neg Hx   ? Stomach cancer Neg Hx   ? Esophageal cancer Neg Hx   ? ? ?Current Outpatient Medications  ?Medication Sig Dispense Refill  ? Adalimumab 40 MG/0.8ML PNKT Inject 40 mg into the skin every 14 (fourteen) days.    ? amoxicillin (AMOXIL) 500 MG capsule TAKE 4 CAPSULES ONE HOUR BEFORE DENTAL APPOINTMENT. 8 capsule 0  ? aspirin 81 MG tablet Take 1 tablet (81 mg total) by mouth daily.    ? atorvastatin (LIPITOR) 80 MG tablet TAKE ONE TABLET AT BEDTIME. 90 tablet 3  ? cilostazol (PLETAL) 100 MG tablet TAKE (1) TABLET TWICE A DAY BEFORE MEALS. 60 tablet 11  ? citalopram (CELEXA) 40 MG tablet Take 40 mg by mouth daily at 12 noon.    ? Cyanocobalamin (VITAMIN B 12) 100 MCG LOZG Take 1 tablet by mouth daily.    ? furosemide (LASIX) 20 MG tablet TAKE 1 TABLET ONCE DAILY. 30 tablet 0  ? Galcanezumab-gnlm (EMGALITY) 120 MG/ML SOAJ Inject in to skin every 30 days. 1.12 mL 11  ? lansoprazole (PREVACID) 15 MG capsule Take 15 mg by mouth daily as needed. For acid reflux    ? metoprolol tartrate (LOPRESSOR) 50 MG tablet TAKE (1/2) TABLET TWICE DAILY. Please keep upcoming appt in August 2022 with Dr. Burt Knack before anymore refills. Thank you 90 tablet 2  ? Riboflavin (VITAMIN B-2 PO) Take 400 mg by mouth daily.    ? Rimegepant Sulfate (NURTEC) 75 MG TBDP Take 75 mg by mouth daily as needed. For migraines. Take as close to onset of migraine as possible. One daily maximum. 4 tablet 0  ? sodium chloride (OCEAN) 0.65 % SOLN nasal spray Place 1 spray into both nostrils as needed for  congestion.    ? tamsulosin (FLOMAX) 0.4 MG CAPS capsule Take 0.4 mg by mouth daily.     ? traMADol (ULTRAM) 50 MG tablet TAKE (1) TABLET TWICE A DAY AS NEEDED. 30 tablet 0  ? ?No current facility-administered medications for this visit.  ? ? ?Allergies  ?Allergen Reactions  ? Clopidogrel Itching  ?  Sulfamethoxazole-Trimethoprim Other (See Comments)  ?  Dizziness, flushed  ? Codeine Other (See Comments)  ?  Blood pressure drops  ? Flexeril [Cyclobenzaprine]   ?  sedation  ? Morphine And Related Itching  ? ? ? ?REVIEW OF SYSTEMS:  ? ?'[X]'$  denotes positive finding, '[ ]'$  denotes negative finding ?Cardiac  Comments:  ?Chest pain or chest pressure:    ?Shortness of breath upon exertion:    ?Short of breath when lying flat:    ?Irregular heart rhythm:    ?    ?Vascular    ?Pain in calf, thigh, or hip brought on by ambulation:    ?Pain in feet at night that wakes you up from your sleep:     ?Blood clot in your veins:    ?Leg swelling:     ?    ?Pulmonary    ?Oxygen at home:    ?Productive cough:     ?Wheezing:     ?    ?Neurologic    ?Sudden weakness in arms or legs:     ?Sudden numbness in arms or legs:     ?Sudden onset of difficulty speaking or slurred speech:    ?Temporary loss of vision in one eye:     ?Problems with dizziness:     ?    ?Gastrointestinal    ?Blood in stool:     ?Vomited blood:     ?    ?Genitourinary    ?Burning when urinating:     ?Blood in urine:    ?    ?Psychiatric    ?Major depression:     ?    ?Hematologic    ?Bleeding problems:    ?Problems with blood clotting too easily:    ?    ?Skin    ?Rashes or ulcers:    ?    ?Constitutional    ?Fever or chills:    ? ? ?PHYSICAL EXAMINATION: ? ?Vitals:  ? 11/16/21 1456  ?BP: 116/75  ?Pulse: 86  ?Resp: 20  ?Temp: 98 ?F (36.7 ?C)  ?TempSrc: Temporal  ?SpO2: 96%  ?Weight: 219 lb 1.6 oz (99.4 kg)  ?Height: 6' (1.829 m)  ? ? ?General:  WDWN in NAD; vital signs documented above ?Gait: Not observed ?HENT: WNL, normocephalic ?Pulmonary: normal non-labored breathing  , without Rales, rhonchi,  wheezing ?Cardiac: regular HR ?Abdomen: soft, NT, no masses ?Skin: without rashes ?Vascular Exam/Pulses: ? Right Left  ?Radial 2+ (normal) 2+ (normal)  ?DP absent absent  ?PT absent 2+

## 2021-11-24 DIAGNOSIS — J019 Acute sinusitis, unspecified: Secondary | ICD-10-CM | POA: Diagnosis not present

## 2021-11-24 DIAGNOSIS — H6123 Impacted cerumen, bilateral: Secondary | ICD-10-CM | POA: Diagnosis not present

## 2021-12-01 DIAGNOSIS — L4050 Arthropathic psoriasis, unspecified: Secondary | ICD-10-CM | POA: Diagnosis not present

## 2021-12-01 DIAGNOSIS — Z79899 Other long term (current) drug therapy: Secondary | ICD-10-CM | POA: Diagnosis not present

## 2021-12-08 DIAGNOSIS — J019 Acute sinusitis, unspecified: Secondary | ICD-10-CM | POA: Diagnosis not present

## 2021-12-13 ENCOUNTER — Ambulatory Visit (INDEPENDENT_AMBULATORY_CARE_PROVIDER_SITE_OTHER): Payer: BC Managed Care – PPO | Admitting: Adult Health

## 2021-12-13 ENCOUNTER — Other Ambulatory Visit: Payer: Self-pay | Admitting: Cardiovascular Disease

## 2021-12-13 ENCOUNTER — Encounter: Payer: Self-pay | Admitting: Adult Health

## 2021-12-13 VITALS — BP 111/86 | HR 93 | Ht 72.0 in | Wt 218.8 lb

## 2021-12-13 DIAGNOSIS — Z9989 Dependence on other enabling machines and devices: Secondary | ICD-10-CM

## 2021-12-13 DIAGNOSIS — G4733 Obstructive sleep apnea (adult) (pediatric): Secondary | ICD-10-CM | POA: Diagnosis not present

## 2021-12-13 DIAGNOSIS — I779 Disorder of arteries and arterioles, unspecified: Secondary | ICD-10-CM

## 2021-12-13 NOTE — Progress Notes (Signed)
Order for mask refit sent to [facility].  ?

## 2021-12-13 NOTE — Progress Notes (Signed)
? ? ?PATIENT: [name] ?DOB: 1951-02-12 ? ?REASON FOR VISIT: follow up ?HISTORY FROM: patient ?PRIMARY NEUROLOGIST: Dr. Brett Fairy ?Chief Complaint  ?Patient presents with  ? Follow-up  ?  Pt in 19  pt is here for CPAP follow up  pt states he still is  getting dry mouth  Pt states that he does wear CPAP 4 hours or more nightly   ? ? ?HISTORY OF PRESENT ILLNESS: ?Today 12/13/21 ?[name] is a 71 year old male with a history of obstructive sleep apnea on CPAP.  He returns today for follow-up. Uses the dreamwear mask.  States that he is a mouth breather.  States he is getting dry mouth at night.  Download is below ? ? ? ?REVIEW OF SYSTEMS: Out of a complete 14 system review of symptoms, the patient complains only of the following symptoms, and all other reviewed systems are negative. ? ? ?ESS 5 ? ?ALLERGIES: ?Allergies  ?Allergen Reactions  ? Clopidogrel Itching  ? Sulfamethoxazole-Trimethoprim Other (See Comments)  ?  Dizziness, flushed  ? Codeine Other (See Comments)  ?  Blood pressure drops  ? Flexeril [Cyclobenzaprine]   ?  sedation  ? Morphine And Related Itching  ? ? ?HOME MEDICATIONS: ?Outpatient Medications Prior to Visit  ?Medication Sig Dispense Refill  ? Adalimumab 40 MG/0.8ML PNKT Inject 40 mg into the skin every 14 (fourteen) days.    ? amoxicillin (AMOXIL) 875 MG tablet Take 875 mg by mouth 2 (two) times daily.    ? aspirin 81 MG tablet Take 1 tablet (81 mg total) by mouth daily.    ? atorvastatin (LIPITOR) 80 MG tablet TAKE ONE TABLET AT BEDTIME. 90 tablet 3  ? cilostazol (PLETAL) 100 MG tablet TAKE (1) TABLET TWICE A DAY BEFORE MEALS. 60 tablet 11  ? citalopram (CELEXA) 40 MG tablet Take 40 mg by mouth daily at 12 noon.    ? Cyanocobalamin (VITAMIN B 12) 100 MCG LOZG Take 1 tablet by mouth daily.    ? furosemide (LASIX) 20 MG tablet TAKE 1 TABLET ONCE DAILY. 30 tablet 0  ? Galcanezumab-gnlm (EMGALITY) 120 MG/ML SOAJ Inject in to skin every 30 days. 1.12 mL 11  ? lansoprazole (PREVACID) 15 MG  capsule Take 15 mg by mouth daily as needed. For acid reflux    ? metoprolol tartrate (LOPRESSOR) 50 MG tablet TAKE (1/2) TABLET TWICE DAILY. Please keep upcoming appt in August 2022 with Dr. Burt Knack before anymore refills. Thank you 90 tablet 2  ? Riboflavin (VITAMIN B-2 PO) Take 400 mg by mouth daily.    ? Rimegepant Sulfate (NURTEC) 75 MG TBDP Take 75 mg by mouth daily as needed. For migraines. Take as close to onset of migraine as possible. One daily maximum. 4 tablet 0  ? sodium chloride (OCEAN) 0.65 % SOLN nasal spray Place 1 spray into both nostrils as needed for congestion.    ? tamsulosin (FLOMAX) 0.4 MG CAPS capsule Take 0.4 mg by mouth daily.     ? traMADol (ULTRAM) 50 MG tablet TAKE (1) TABLET TWICE A DAY AS NEEDED. 30 tablet 0  ? amoxicillin (AMOXIL) 500 MG capsule TAKE 4 CAPSULES ONE HOUR BEFORE DENTAL APPOINTMENT. (Patient not taking: Reported on 12/13/2021) 8 capsule 0  ? ?No facility-administered medications prior to visit.  ? ? ?PAST MEDICAL HISTORY: ?Past Medical History:  ?Diagnosis Date  ? Aortic stenosis   ? a. severe bicuspid AV stenosis s/p pericardial tissue valve 2012.  ? Arthritis   ? Coronary artery disease   ?  a. s/p CABG (LIMA-LCx) at time of AVR 2012, mild nonobstructive LAD and RCA stenoses.  ? Depression   ? Depression   ? Diabetes mellitus without complication ([place])   ? ED (erectile dysfunction)   ? Fatty liver   ? Femoral-popliteal bypass graft occlusion, right ([place]) 1997  ? Dr. Kellie Simmering  ? GERD (gastroesophageal reflux disease)   ? Gout   ? H/O migraine   ? up to his 93s  ? Headache(784.0)   ? Hematuria   ? negative workup  ? History of colonic polyps   ? Hyperlipemia, mixed   ? Hyperlipidemia   ? Hypertension   ? Irritable bowel syndrome 02/19/2020  ? Migraines   ? Near syncope   ? 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  ? Obesity   ? Obstructive sleep apnea 01/10/2021  ? PAD (peripheral artery disease) ([place])   ? PONV (postoperative nausea and vomiting)   ? PVD  (peripheral vascular disease) ([place])   ? ? ?PAST SURGICAL HISTORY: ?Past Surgical History:  ?Procedure Laterality Date  ? AORTIC VALVE REPLACEMENT  07/18/2011  ? Procedure: AORTIC VALVE REPLACEMENT (AVR);  Surgeon: Grace Isaac, MD;  Location: [facility];  Service: Open Heart Surgery;  Laterality: N/A;  ? CARDIAC CATHETERIZATION  2009, 07/11/11  ? Dr. Irish Lack  ? CORONARY ARTERY BYPASS GRAFT  07/18/2011  ? Procedure: CORONARY ARTERY BYPASS GRAFTING (CABG);  Surgeon: Grace Isaac, MD;  Location: [facility];  Service: Open Heart Surgery;  Laterality: N/A;  times one to mammary artery, left  ? fem stent Right 2017  ? FEMORAL ARTERY - POPLITEAL ARTERY BYPASS GRAFT    ? KNEE ARTHROSCOPY    ? left  ? MRI    ? to visualize aortic valve  ? PERIPHERAL VASCULAR CATHETERIZATION N/A 07/12/2016  ? Procedure: Abdominal Aortogram w/Lower Extremity;  Surgeon: Waynetta Sandy, MD;  Location: [facility];  Service: Cardiovascular;  Laterality: N/A;  ? PERIPHERAL VASCULAR CATHETERIZATION Right 07/12/2016  ? Procedure: Peripheral Vascular Atherectomy;  Surgeon: Waynetta Sandy, MD;  Location: [facility];  Service: Cardiovascular;  Laterality: Right;  Anterior Tibial and Popliteal  ? ROTATOR CUFF REPAIR    ? Left  ? US ECHOCARDIOGRAPHY    ? ? ?FAMILY HISTORY: ?Family History  ?Problem Relation Age of Onset  ? Hypertension Father   ? Diabetes Father   ? Lung cancer Father   ? Breast cancer Sister   ? Healthy Brother   ? Heart disease Maternal Grandmother   ? Healthy Maternal Grandfather   ? Cancer Maternal Grandfather   ? Migraines Neg Hx   ? Headache Neg Hx   ? Colon cancer Neg Hx   ? Pancreatic cancer Neg Hx   ? Stomach cancer Neg Hx   ? Esophageal cancer Neg Hx   ? Sleep apnea Neg Hx   ? ? ?SOCIAL HISTORY: ?Social History  ? ?Socioeconomic History  ? Marital status: Divorced  ?  Spouse name: Not on file  ? Number of children: 4  ? Years of education: Not on file  ? Highest education level: Bachelor's  degree (e.g., BA, AB, BS)  ?Occupational History  ? Occupation: [employer]  ?  Employer: [employer]   ?Tobacco Use  ? Smoking status: Never  ?  Passive exposure: Never  ? Smokeless tobacco: Never  ?Vaping Use  ? Vaping Use: Never used  ?Substance and Sexual Activity  ? Alcohol use: No  ? Drug  use: No  ? Sexual activity: Not on file  ?Other Topics Concern  ? Not on file  ?Social History Narrative  ? 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 Left handed  ? Caffeine: about 1 cups daily  ? ?Social Determinants of Health  ? ?Financial Resource Strain: Not on file  ?Food Insecurity: Not on file  ?Transportation Needs: Not on file  ?Physical Activity: Not on file  ?Stress: Not on file  ?Social Connections: Not on file  ?Intimate Partner Violence: Not on file  ? ? ? ? ?PHYSICAL EXAM ? ?Vitals:  ? 12/13/21 1011  ?BP: 111/86  ?Pulse: 93  ?Weight: 218 lb 12.8 oz (99.2 kg)  ?Height: 6' (1.829 m)  ? ?Body mass index is 29.67 kg/m?. ? ?Generalized: Well developed, in no acute distress  ?Chest: Lungs clear to auscultation bilaterally ? ?Neurological examination  ?Mentation: Alert oriented to time, place, history taking. Follows all commands speech and language fluent ?Cranial nerve II-XII: Extraocular movements were full, visual field were full on confrontational test Head turning and shoulder shrug  were normal and symmetric. ?Motor: The motor testing reveals 5 over 5 strength of all 4 extremities. Good symmetric motor tone is noted throughout.  ?Sensory: Sensory testing is intact to soft touch on all 4 extremities. No evidence of extinction is noted.  ?Gait and station: Gait is normal.  ? ? ?DIAGNOSTIC DATA (LABS, IMAGING, TESTING) ?- I reviewed patient records, labs, notes, testing and imaging myself where available. ? ?Lab Results  ?Component Value Date  ? WBC 7.3 04/14/2016  ? HGB 12.6 (L) 07/12/2016  ? HCT 37.0 (L) 07/12/2016  ? MCV 83.1 04/14/2016  ? PLT 290 04/14/2016  ? ?   ?Component Value Date/Time  ? NA 142 10/25/2021  1037  ? K 4.2 10/25/2021 1037  ? CL 109 (H) 10/25/2021 1037  ? CO2 18 (L) 10/25/2021 1037  ? GLUCOSE 99 10/25/2021 1037  ? GLUCOSE 112 (H) 07/12/2016 0717  ? BUN 17 10/25/2021 1037  ? CREATININE 1.10 10/25/2021

## 2021-12-14 NOTE — Telephone Encounter (Signed)
Sent CT's to [facility] and they will call the patient to schedule. ?

## 2022-01-17 ENCOUNTER — Other Ambulatory Visit: Payer: BC Managed Care – PPO

## 2022-02-15 ENCOUNTER — Other Ambulatory Visit: Payer: Self-pay

## 2022-02-15 ENCOUNTER — Telehealth: Payer: Self-pay | Admitting: Orthopaedic Surgery

## 2022-02-15 DIAGNOSIS — G8929 Other chronic pain: Secondary | ICD-10-CM

## 2022-02-15 NOTE — Telephone Encounter (Signed)
Yes thanks 

## 2022-02-15 NOTE — Telephone Encounter (Signed)
Patient called needing to be referred back to Dr. Ernestina Patches for an injection in his left shoulder. The number to contact patient is [phone]

## 2022-02-15 NOTE — Telephone Encounter (Signed)
Called patient. LMOM that shoulder injection with Dr.Newton as been ordered.

## 2022-02-24 ENCOUNTER — Ambulatory Visit (INDEPENDENT_AMBULATORY_CARE_PROVIDER_SITE_OTHER): Payer: BC Managed Care – PPO

## 2022-02-24 ENCOUNTER — Ambulatory Visit: Payer: Self-pay

## 2022-02-24 ENCOUNTER — Ambulatory Visit: Payer: BC Managed Care – PPO | Admitting: Orthopaedic Surgery

## 2022-02-24 DIAGNOSIS — M1711 Unilateral primary osteoarthritis, right knee: Secondary | ICD-10-CM

## 2022-02-24 DIAGNOSIS — M1712 Unilateral primary osteoarthritis, left knee: Secondary | ICD-10-CM | POA: Diagnosis not present

## 2022-02-24 DIAGNOSIS — M170 Bilateral primary osteoarthritis of knee: Secondary | ICD-10-CM

## 2022-02-24 MED ORDER — BUPIVACAINE HCL 0.5 % IJ SOLN
2.0000 mL | INTRAMUSCULAR | Status: AC | PRN
Start: 2022-02-24 — End: 2022-02-24
  Administered 2022-02-24: 2 mL via INTRA_ARTICULAR

## 2022-02-24 MED ORDER — LIDOCAINE HCL 1 % IJ SOLN
2.0000 mL | INTRAMUSCULAR | Status: AC | PRN
Start: 2022-02-24 — End: 2022-02-24
  Administered 2022-02-24: 2 mL

## 2022-02-24 MED ORDER — METHYLPREDNISOLONE ACETATE 40 MG/ML IJ SUSP
40.0000 mg | INTRAMUSCULAR | Status: AC | PRN
  Administered 2022-02-24: 40 mg via INTRA_ARTICULAR

## 2022-02-24 NOTE — Progress Notes (Signed)
Office Visit Note   Patient: [name]           Date of Birth: [birth year]           MRN: [id number] Visit Date: 02/24/2022              Requested by: Caren Macadam, MD [address] [place],  [place] [address] PCP: Caren Macadam, MD   Assessment & Plan: Visit Diagnoses:  1. Primary osteoarthritis of right knee   2. Primary osteoarthritis of left knee     Plan: I impression is bilateral knee osteoarthritis worse on the left knee.  Treatment options reviewed and we repeated cortisone injections today.  Follow-up as needed.  Follow-Up Instructions: No follow-ups on file.   Orders:  Orders Placed This Encounter  Procedures   Large Joint Inj   XR KNEE 3 VIEW LEFT   XR KNEE 3 VIEW RIGHT   No orders of the defined types were placed in this encounter.     Procedures: Large Joint Inj: bilateral knee on 02/24/2022 10:20 AM Indications: pain Details: 22 G needle  Arthrogram: No  Medications (Right): 2 mL lidocaine 1 %; 2 mL bupivacaine 0.5 %; 40 mg methylPREDNISolone acetate 40 MG/ML Medications (Left): 2 mL lidocaine 1 %; 2 mL bupivacaine 0.5 %; 40 mg methylPREDNISolone acetate 40 MG/ML Outcome: tolerated well, no immediate complications Patient was prepped and draped in the usual sterile fashion.       Clinical Data: No additional findings.   Subjective: Chief Complaint  Patient presents with   Left Knee - Pain    HPI [name] is here for bilateral knee injections.  The previous ones were about a year and a half ago and they have worn off.  Denies any changes otherwise. Review of Systems   Objective: Vital Signs: There were no vitals taken for this visit.  Physical Exam  Ortho Exam Bilateral knee examinations are unchanged.  No joint effusion.  Slightly varus alignment to the left knee. Specialty Comments:  No specialty comments available.  Imaging: XR KNEE 3 VIEW LEFT  Result Date: 02/24/2022 Advanced tricompartmental degenerative joint  disease.  Bone-on-bone joint space narrowing.  XR KNEE 3 VIEW RIGHT  Result Date: 02/24/2022 Moderate tricompartmental osteoarthritis.    PMFS History: Patient Active Problem List   Diagnosis Date Noted   Abnormal MRI, neck 11/16/2021   Rotator cuff arthropathy of left shoulder 08/17/2021   Chronic left shoulder pain 08/17/2021   Chronic frontal sinusitis 07/07/2021   Facial paresthesia 07/07/2021   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 07/07/2021   Cervical stenosis of spinal canal 06/07/2021   Meningeal irritation 06/07/2021   Idiopathic intracranial hypotension 06/07/2021   Cervicalgia 03/07/2021   Chronic daily headache 03/07/2021   Migraine 01/10/2021   Obstructive sleep apnea 01/10/2021   Renal stones 10/04/2020   Type 2 diabetes mellitus with unspecified complications ([place]) 31/54/0086   Benign prostatic hyperplasia without lower urinary tract symptoms 09/30/2020   GERD (gastroesophageal reflux disease) 05/05/2020   Irritable bowel syndrome 02/19/2020   Dependence on other enabling machines and devices 02/19/2020   ED (erectile dysfunction) of organic origin 02/19/2020   Major depression single episode, in partial remission ([place]) 02/19/2020   Obesity 02/19/2020   Chronic right-sided low back pain without sciatica 12/16/2019   Primary osteoarthritis of left knee 09/16/2019   Rotator cuff tear arthropathy of left shoulder 09/16/2019   Primary osteoarthritis of right knee 09/16/2019   Complex tear of medial meniscus  of right knee as current injury 03/04/2019   Chronic diastolic heart failure ([place]) 11/25/2018   Bruxism (teeth grinding) 11/25/2018   Cephalalgia 11/25/2018   Cerebrovascular disease 11/25/2018   Morning headache 11/25/2018   Sleep related headaches 09/26/2018   Chronic idiopathic gout without tophus 02/13/2018   Hypertension    History of adenomatous polyp of colon 03/14/2016   Thrombosed hemorrhoids 03/01/2016   Encounter for  long-term (current) use of high-risk medication 06/30/2015   Psoriasis 12/25/2014   Major depressive disorder, recurrent, in full remission ([place]) 06/22/2014   Gout 04/21/2014   Chest pain, somewhat atypical 12/28/2013   Dyspnea on exertion 12/27/2011   Dizziness 08/15/2011   Heart valve replaced by other means 07/28/2011   Aortic stenosis, severe 07/18/2011   Coronary artery disease 07/06/2011   Peripheral vascular disease ([place]) 07/06/2011   Hyperlipidemia 07/06/2011   Bicuspid aortic valve 07/06/2011   Past Medical History:  Diagnosis Date   Aortic stenosis    a. severe bicuspid AV stenosis s/p pericardial tissue valve 2012.   Arthritis    Coronary artery disease    a. s/p CABG (LIMA-LCx) at time of AVR 2012, mild nonobstructive LAD and RCA stenoses.   Depression    Depression    Diabetes mellitus without complication [facility])    ED (erectile dysfunction)    Fatty liver    Femoral-popliteal bypass graft occlusion, right [facility]) 1997   Dr. 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   Obesity    Obstructive sleep apnea 01/10/2021   PAD (peripheral artery disease) (HCC)    PONV (postoperative nausea and vomiting)    PVD (peripheral vascular disease) ([place])     Family History  Problem Relation Age of Onset   Hypertension Father    Diabetes Father    Lung cancer Father    Breast cancer Sister    Healthy Brother    Heart disease Maternal Grandmother    Healthy Maternal Grandfather    Cancer Maternal Grandfather    Migraines Neg Hx    Headache Neg Hx    Colon cancer Neg Hx    Pancreatic cancer Neg Hx    Stomach cancer Neg Hx    Esophageal cancer Neg Hx    Sleep  apnea Neg Hx     Past Surgical History:  Procedure Laterality Date   AORTIC VALVE REPLACEMENT  07/18/2011   Procedure: AORTIC VALVE REPLACEMENT (AVR);  Surgeon: Grace Isaac, MD;  Location: [facility];  Service: Open Heart Surgery;  Laterality: N/A;   CARDIAC CATHETERIZATION  2009, 07/11/11   Dr. Irish Lack   CORONARY ARTERY BYPASS GRAFT  07/18/2011   Procedure: CORONARY ARTERY BYPASS GRAFTING (CABG);  Surgeon: Grace Isaac, MD;  Location: [facility];  Service: Open Heart Surgery;  Laterality: N/A;  times one to mammary artery, left   fem stent Right 2017   FEMORAL ARTERY - POPLITEAL ARTERY BYPASS GRAFT     KNEE ARTHROSCOPY     left   MRI     to visualize aortic valve   PERIPHERAL VASCULAR CATHETERIZATION  N/A 07/12/2016   Procedure: Abdominal Aortogram w/Lower Extremity;  Surgeon: Waynetta Sandy, MD;  Location: [facility];  Service: Cardiovascular;  Laterality: N/A;   PERIPHERAL VASCULAR CATHETERIZATION Right 07/12/2016   Procedure: Peripheral Vascular Atherectomy;  Surgeon: Waynetta Sandy, MD;  Location: [facility];  Service: Cardiovascular;  Laterality: Right;  Anterior Tibial and Popliteal   ROTATOR CUFF REPAIR     Left   US ECHOCARDIOGRAPHY     Social History   Occupational History   Occupation: Horticulturist, commercial: [employer]   Tobacco Use   Smoking status: Never    Passive exposure: Never   Smokeless tobacco: Never  Vaping Use   Vaping Use: Never used  Substance and Sexual Activity   Alcohol use: No   Drug use: No   Sexual activity: Not on file

## 2022-02-28 ENCOUNTER — Ambulatory Visit: Payer: Self-pay

## 2022-02-28 ENCOUNTER — Ambulatory Visit: Payer: BC Managed Care – PPO | Admitting: Physical Medicine and Rehabilitation

## 2022-02-28 DIAGNOSIS — M65842 Other synovitis and tenosynovitis, left hand: Secondary | ICD-10-CM | POA: Diagnosis not present

## 2022-02-28 DIAGNOSIS — M25512 Pain in left shoulder: Secondary | ICD-10-CM | POA: Diagnosis not present

## 2022-02-28 DIAGNOSIS — G8929 Other chronic pain: Secondary | ICD-10-CM | POA: Diagnosis not present

## 2022-02-28 DIAGNOSIS — M79645 Pain in left finger(s): Secondary | ICD-10-CM | POA: Diagnosis not present

## 2022-02-28 NOTE — Progress Notes (Unsigned)
  Numeric Pain Rating Scale and Functional Assessment Average Pain (5)   In the last MONTH (on 0-10 scale) has pain interfered with the following?  1. General activity like being  able to carry out your everyday physical activities such as walking, climbing stairs, carrying groceries, or moving a chair?  Rating(5)   +Driver, -BT, -Dye Allergies.  

## 2022-02-28 NOTE — Progress Notes (Unsigned)
   ODIES DESA - 71 y.o. male MRN [id number]  Date of birth: 07/24/1951  Office Visit Note: Visit Date: 02/28/2022 PCP: Caren Macadam, MD Referred by: Caren Macadam, MD  Subjective: Chief Complaint  Patient presents with   Left Shoulder - Pain   HPI:  [name] is a 71 y.o. male who comes in today at the request of Dr. Eduard Roux for planned Left anesthetic glenohumeral arthrogram with fluoroscopic guidance.  The patient has failed conservative care including home exercise, medications, time and activity modification.  This injection will be diagnostic and hopefully therapeutic.  Please see requesting physician notes for further details and justification.   ROS Otherwise per HPI.  Assessment & Plan: Visit Diagnoses:    ICD-10-CM   1. Chronic left shoulder pain  M25.512 XR C-ARM NO REPORT   G89.29       Plan: No additional findings.   Meds & Orders: No orders of the defined types were placed in this encounter.   Orders Placed This Encounter  Procedures   Large Joint Inj   XR C-ARM NO REPORT    Follow-up: Return for visit to requesting provider as needed.   Procedures: Large Joint Inj: L glenohumeral on 02/28/2022 8:15 AM Indications: pain and diagnostic evaluation Details: 22 G 3.5 in needle, fluoroscopy-guided anteromedial approach  Arthrogram: No  Medications: 40 mg triamcinolone acetonide 40 MG/ML; 5 mL bupivacaine 0.25 % Outcome: tolerated well, no immediate complications  There was excellent flow of contrast producing a partial arthrogram of the glenohumeral joint. The patient did have relief of symptoms during the anesthetic phase of the injection. Procedure, treatment alternatives, risks and benefits explained, specific risks discussed. Consent was given by the patient. Immediately prior to procedure a time out was called to verify the correct patient, procedure, equipment, support staff and site/side marked as required. Patient was prepped and draped in  the usual sterile fashion.          Clinical History: No specialty comments available.     Objective:  VS:  HT:    WT:   BMI:     BP:   HR: bpm  TEMP: ( )  RESP:  Physical Exam   Imaging: No results found.

## 2022-03-01 MED ORDER — TRIAMCINOLONE ACETONIDE 40 MG/ML IJ SUSP
40.0000 mg | INTRAMUSCULAR | Status: AC | PRN
  Administered 2022-02-28: 40 mg via INTRA_ARTICULAR

## 2022-03-01 MED ORDER — BUPIVACAINE HCL 0.25 % IJ SOLN
5.0000 mL | INTRAMUSCULAR | Status: AC | PRN
  Administered 2022-02-28: 5 mL via INTRA_ARTICULAR

## 2022-03-03 DIAGNOSIS — E538 Deficiency of other specified B group vitamins: Secondary | ICD-10-CM | POA: Diagnosis not present

## 2022-03-03 DIAGNOSIS — I10 Essential (primary) hypertension: Secondary | ICD-10-CM | POA: Diagnosis not present

## 2022-03-03 DIAGNOSIS — I739 Peripheral vascular disease, unspecified: Secondary | ICD-10-CM | POA: Diagnosis not present

## 2022-03-03 DIAGNOSIS — E118 Type 2 diabetes mellitus with unspecified complications: Secondary | ICD-10-CM | POA: Diagnosis not present

## 2022-03-03 DIAGNOSIS — Z23 Encounter for immunization: Secondary | ICD-10-CM | POA: Diagnosis not present

## 2022-03-03 DIAGNOSIS — Z125 Encounter for screening for malignant neoplasm of prostate: Secondary | ICD-10-CM | POA: Diagnosis not present

## 2022-03-03 DIAGNOSIS — Z0000 Encounter for general adult medical examination without abnormal findings: Secondary | ICD-10-CM | POA: Diagnosis not present

## 2022-03-03 DIAGNOSIS — N400 Enlarged prostate without lower urinary tract symptoms: Secondary | ICD-10-CM | POA: Diagnosis not present

## 2022-03-03 DIAGNOSIS — E782 Mixed hyperlipidemia: Secondary | ICD-10-CM | POA: Diagnosis not present

## 2022-03-07 ENCOUNTER — Ambulatory Visit: Payer: BC Managed Care – PPO | Admitting: Adult Health

## 2022-04-10 NOTE — Progress Notes (Unsigned)
Cardiology Office Note:    Date:  04/11/2022   ID:  [name], DOB 1951/07/03, MRN [id number]  PCP:  Caren Macadam, [facility] Cardiologist:  Sherren Mocha, MD    Referring MD: Caren Macadam, MD   Chief Complaint:  F/u for CAD, hx of AVR    Patient Profile: Severe bicuspid AS S/p bioprosthetic AVR in 2012 Coronary artery disease  S/p 1v CABG (L-LCx) in 2012 Peripheral arterial disease  S/p fem-pop bypass graft 1997 S/p atherectomy of R ant tib, R pop; angioplasty R pop in 2017 (Dr. Donzetta Matters) Intermittent claudication F/u by vasc surgery  Diabetes mellitus Hypertension  Hyperlipidemia   Prior CV Studies: ECHO COMPLETE WITH IMAGING ENHANCING AGENT 03/28/2021 Mild septal HK (postsurgical state), EF 60-65, mild concentric LVH, GR 1 DD, normal RVSF, trivial MR, s/p AVR functioning normally without stenosis or regurgitation open.  Mean gradient 6), mild dilation of ascending aorta (37 mm)  MYOVIEW 01/14/14 Normal   History of Present Illness:   [name] is a 71 y.o. male with the above problem list.  He was last seen by Dr. Burt Knack in Aug 2022. He returns for f/u. He is here alone. He has noted some shortness of breath over the past 1-2 mos. He has not had chest pain. He notes that he has to take a deep breath at times. He will feel out of breath going up a hill. However, he can still mow his yard without any appreciable shortness of breath. He has not had palpitations, orthopnea, leg edema, syncope.         Past Medical History:  Diagnosis Date   Aortic stenosis    a. severe bicuspid AV stenosis s/p pericardial tissue valve 2012.   Arthritis    Coronary artery disease    a. s/p CABG (LIMA-LCx) at time of AVR 2012, mild nonobstructive LAD and RCA stenoses.   Depression    Depression    Diabetes mellitus without complication [facility])    ED (erectile dysfunction)    Fatty liver    Femoral-popliteal bypass graft occlusion, right [facility]) 1997    Dr. 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   Obesity    Obstructive sleep apnea 01/10/2021   PAD (peripheral artery disease) (HCC)    PONV (postoperative nausea and vomiting)    PVD (peripheral vascular disease) (HCC)    Current Medications: Current Meds  Medication Sig   Adalimumab 40 MG/0.8ML PNKT Inject 40 mg into the skin every 14 (fourteen) days.   amoxicillin (AMOXIL) 500 MG capsule TAKE 4 CAPSULES ONE HOUR BEFORE DENTAL APPOINTMENT.   aspirin 81 MG tablet Take 1 tablet (81 mg total) by mouth daily.   atorvastatin (LIPITOR) 80 MG tablet TAKE ONE TABLET AT BEDTIME.   cilostazol (PLETAL) 100 MG tablet TAKE (1) TABLET TWICE A DAY BEFORE MEALS.   citalopram (CELEXA) 40 MG tablet Take 40 mg by mouth daily at 12 noon.   Cyanocobalamin (VITAMIN B 12) 100 MCG LOZG Take 1 tablet by mouth daily.   dapagliflozin propanediol (FARXIGA)  5 MG TABS tablet Take 5 mg by mouth daily.   furosemide (LASIX) 20 MG tablet TAKE ONE TABLET ONCE DAILY   Galcanezumab-gnlm (EMGALITY) 120 MG/ML SOAJ Inject in to skin every 30 days.   lansoprazole (PREVACID) 15 MG capsule Take 15 mg by mouth daily as needed. For acid reflux   metoprolol tartrate (LOPRESSOR) 50 MG tablet TAKE (1/2) TABLET TWICE DAILY. Please keep upcoming appt in August 2022 with Dr. Burt Knack before anymore refills. Thank you   Riboflavin (VITAMIN B-2 PO) Take 400 mg by mouth daily.   Rimegepant Sulfate (NURTEC) 75 MG TBDP Take 75 mg by mouth daily as needed. For migraines. Take as close to onset of migraine as possible. One daily maximum.   sodium chloride (OCEAN) 0.65 % SOLN nasal spray Place 1 spray into both nostrils as needed for  congestion.   tamsulosin (FLOMAX) 0.4 MG CAPS capsule Take 0.4 mg by mouth daily.    traMADol (ULTRAM) 50 MG tablet TAKE (1) TABLET TWICE A DAY AS NEEDED.    Allergies:   Clopidogrel, Sulfamethoxazole-trimethoprim, Codeine, Flexeril [cyclobenzaprine], and Morphine and related   Social History   Tobacco Use   Smoking status: Never    Passive exposure: Never   Smokeless tobacco: Never  Vaping Use   Vaping Use: Never used  Substance Use Topics   Alcohol use: No   Drug use: No    Family Hx: The patient's family history includes Breast cancer in his sister; Cancer in his maternal grandfather; Diabetes in his father; Healthy in his brother and maternal grandfather; Heart disease in his maternal grandmother; Hypertension in his father; Lung cancer in his father. There is no history of Migraines, Headache, Colon cancer, Pancreatic cancer, Stomach cancer, Esophageal cancer, or Sleep apnea.  Review of Systems  Constitutional: Negative for fever.  Cardiovascular:  Positive for claudication (mild R calf; stable).  Respiratory:  Negative for cough.   Gastrointestinal:  Negative for hematochezia.  Genitourinary:  Negative for hematuria.     EKGs/Labs/Other Test Reviewed:    EKG:  EKG is   ordered today.  The ekg ordered today demonstrates sinus rhythm with sinus arrhythmia, Left axis deviation, anteroseptal Q waves, T wave inversions 1, aVL, QTc 427, [place] prior tracings  Recent Labs: 10/25/2021: BUN 17; Creatinine, Ser 1.10; Potassium 4.2; Sodium 142   Labs obtained through [facility] - personally reviewed and interpreted: 03/03/2022: Total cholesterol 142, HDL 45, LDL 73, triglycerides 132, A1c 7, Hgb 15.9, creatinine 1.04, K+ 4.3, ALT 29    Risk Assessment/Calculations/Metrics:              Physical Exam:    VS:  BP 122/82   Pulse 83   Ht 6' (1.829 m)   Wt 221 lb 9.6 oz (100.5 kg)   SpO2 96%   BMI 30.05 kg/m     Wt Readings from Last 3 Encounters:  04/11/22 221 lb 9.6 oz  (100.5 kg)  12/13/21 218 lb 12.8 oz (99.2 kg)  11/16/21 219 lb 1.6 oz (99.4 kg)    Constitutional:      Appearance: Healthy appearance. Not in distress.  Neck:     Vascular: JVD normal.  Pulmonary:     Effort: Pulmonary effort is normal.     Breath sounds: No wheezing. No rales.  Cardiovascular:     Normal rate. Irregular rhythm. Normal S1. Normal S2.      Murmurs: There is no murmur.  Edema:    Peripheral edema absent.  Abdominal:  Palpations: Abdomen is soft.  Skin:    General: Skin is warm and dry.  Neurological:     General: No focal deficit present.     Mental Status: Alert and oriented to person, place and time.          ASSESSMENT & PLAN:   Dyspnea on exertion He notes shortness of breath from time to time.  He has not had chest discomfort.  He does have lateral T wave inversions on his electrocardiogram.  This has been noted in the past.  He is diabetic and may not experience chest pain.  His last stress test was in 2015.  I have recommended proceeding with an exercise Myoview to rule out ischemia.  He he just had an echocardiogram last year.  His cardiac exam is normal.  I do not think he needs a repeat echocardiogram at this time.  He does have sinus arrhythmia on his electrocardiogram but no symptoms of palpitations. Arrange exercise Myoview Follow-up 6 months or sooner if symptoms worsen  Coronary artery disease History of CABG x1 at the time of his AVR.  Last Myoview in 2015.  He is not having chest pain but does note shortness of breath from time to time.  As noted, I will arrange an exercise Myoview.  Continue aspirin 81 mg daily, Lipitor 80 mg daily.  Follow-up in 6 months.  Aortic stenosis, severe Status post bioprosthetic AVR.  Echocardiogram in August 2022 demonstrated normally functioning AVR.  Cardiac exam is normal.  He will likely need a repeat echocardiogram again in 1 year.  Continue SBE prophylaxis.  Hypertension Blood pressure is controlled.   Continue metoprolol tartrate 25 mg twice daily.  Hyperlipidemia Recent LDL slightly above 70.  Continue Lipitor 80 mg daily.  Continue to work on diet.  Consider repeat lipid panel at follow-up.  If LDL remains above 70, consider adding ezetimibe.  Peripheral vascular disease ([place]) Managed by vascular surgery.  He has moderate claudication in the right calf which is overall stable.  Type 2 diabetes mellitus with unspecified complications (HCC) Recent A1c 7.  Continue follow-up with primary care.        Shared Decision Making/Informed Consent The risks [chest pain, shortness of breath, cardiac arrhythmias, dizziness, blood pressure fluctuations, myocardial infarction, stroke/transient ischemic attack, nausea, vomiting, allergic reaction, radiation exposure, metallic taste sensation and life-threatening complications (estimated to be 1 in 10,000)], benefits (risk stratification, diagnosing coronary artery disease, treatment guidance) and alternatives of a nuclear stress test were discussed in detail with Mr. Weidinger and he agrees to proceed.   Dispo:  Return in about 6 months (around 10/10/2022) for Routine Follow Up, w/ Dr. Burt Knack.   Medication Adjustments/Labs and Tests Ordered: Current medicines are reviewed at length with the patient today.  Concerns regarding medicines are outlined above.  Tests Ordered: Orders Placed This Encounter  Procedures   MYOCARDIAL PERFUSION IMAGING   EKG 12-Lead   Medication Changes: No orders of the defined types were placed in this encounter.  Signed, Richardson Dopp, PA-C  04/11/2022 9:06 AM    [facility] [address], [place], [place]  [address] Phone: [phone]; Fax: [phone]

## 2022-04-11 ENCOUNTER — Ambulatory Visit: Payer: BC Managed Care – PPO | Attending: Physician Assistant | Admitting: Physician Assistant

## 2022-04-11 ENCOUNTER — Encounter: Payer: Self-pay | Admitting: *Deleted

## 2022-04-11 ENCOUNTER — Encounter: Payer: Self-pay | Admitting: Physician Assistant

## 2022-04-11 VITALS — BP 122/82 | HR 83 | Ht 72.0 in | Wt 221.6 lb

## 2022-04-11 DIAGNOSIS — I739 Peripheral vascular disease, unspecified: Secondary | ICD-10-CM

## 2022-04-11 DIAGNOSIS — E7800 Pure hypercholesterolemia, unspecified: Secondary | ICD-10-CM

## 2022-04-11 DIAGNOSIS — I350 Nonrheumatic aortic (valve) stenosis: Secondary | ICD-10-CM

## 2022-04-11 DIAGNOSIS — I2510 Atherosclerotic heart disease of native coronary artery without angina pectoris: Secondary | ICD-10-CM

## 2022-04-11 DIAGNOSIS — R0602 Shortness of breath: Secondary | ICD-10-CM

## 2022-04-11 DIAGNOSIS — R0609 Other forms of dyspnea: Secondary | ICD-10-CM | POA: Diagnosis not present

## 2022-04-11 DIAGNOSIS — I10 Essential (primary) hypertension: Secondary | ICD-10-CM

## 2022-04-11 DIAGNOSIS — E118 Type 2 diabetes mellitus with unspecified complications: Secondary | ICD-10-CM

## 2022-04-11 NOTE — Assessment & Plan Note (Signed)
Recent LDL slightly above 70.  Continue Lipitor 80 mg daily.  Continue to work on diet.  Consider repeat lipid panel at follow-up.  If LDL remains above 70, consider adding ezetimibe.

## 2022-04-11 NOTE — Assessment & Plan Note (Signed)
Status post bioprosthetic AVR.  Echocardiogram in August 2022 demonstrated normally functioning AVR.  Cardiac exam is normal.  He will likely need a repeat echocardiogram again in 1 year.  Continue SBE prophylaxis.

## 2022-04-11 NOTE — Addendum Note (Signed)
Addended byKathlen Mody, Nicki Reaper T on: 04/11/2022 01:58 PM   Modules accepted: Orders

## 2022-04-11 NOTE — Patient Instructions (Signed)
Medication Instructions:  Your physician recommends that you continue on your current medications as directed. Please refer to the Current Medication list given to you today.  *If you need a refill on your cardiac medications before your next appointment, please call your pharmacy*   Lab Work: None ordered  If you have labs (blood work) drawn today and your tests are completely normal, you will receive your results only by: [place] (if you have MyChart) OR A paper copy in the mail If you have any lab test that is abnormal or we need to change your treatment, we will call you to review the results.   Testing/Procedures: Your physician has requested that you have en exercise stress myoview. For further information please visit [email or web address]. Please follow instruction sheet, as given.    Follow-Up: At [facility], you and your health needs are our priority.  As part of our continuing mission to provide you with exceptional heart care, we have created designated Provider Care Teams.  These Care Teams include your primary Cardiologist (physician) and Advanced Practice Providers (APPs -  Physician Assistants and Nurse Practitioners) who all work together to provide you with the care you need, when you need it.  We recommend signing up for the patient portal called "MyChart".  Sign up information is provided on this After Visit Summary.  MyChart is used to connect with patients for Virtual Visits (Telemedicine).  Patients are able to view lab/test results, encounter notes, upcoming appointments, etc.  Non-urgent messages can be sent to your provider as well.   To learn more about what you can do with MyChart, go to [email or web address].    Your next appointment:   6 month(s)  The format for your next appointment:   In Person  Provider:   Sherren Mocha, MD     Other Instructions   Important Information About Sugar

## 2022-04-11 NOTE — Assessment & Plan Note (Signed)
History of CABG x1 at the time of his AVR.  Last Myoview in 2015.  He is not having chest pain but does note shortness of breath from time to time.  As noted, I will arrange an exercise Myoview.  Continue aspirin 81 mg daily, Lipitor 80 mg daily.  Follow-up in 6 months.

## 2022-04-11 NOTE — Assessment & Plan Note (Signed)
Blood pressure is controlled.  Continue metoprolol tartrate 25 mg twice daily.

## 2022-04-11 NOTE — Assessment & Plan Note (Signed)
Recent A1c 7.  Continue follow-up with primary care.

## 2022-04-11 NOTE — Assessment & Plan Note (Signed)
He notes shortness of breath from time to time.  He has not had chest discomfort.  He does have lateral T wave inversions on his electrocardiogram.  This has been noted in the past.  He is diabetic and may not experience chest pain.  His last stress test was in 2015.  I have recommended proceeding with an exercise Myoview to rule out ischemia.  He he just had an echocardiogram last year.  His cardiac exam is normal.  I do not think he needs a repeat echocardiogram at this time.  He does have sinus arrhythmia on his electrocardiogram but no symptoms of palpitations. Arrange exercise Myoview Follow-up 6 months or sooner if symptoms worsen

## 2022-04-11 NOTE — Assessment & Plan Note (Signed)
Managed by vascular surgery.  He has moderate claudication in the right calf which is overall stable.

## 2022-04-19 ENCOUNTER — Telehealth (HOSPITAL_COMMUNITY): Payer: Self-pay | Admitting: *Deleted

## 2022-04-19 ENCOUNTER — Encounter (HOSPITAL_COMMUNITY): Payer: Self-pay | Admitting: *Deleted

## 2022-04-19 NOTE — Telephone Encounter (Signed)
Left message on voicemail in reference to upcoming appointment scheduled for 04/26/22. Phone number given for a call back so details instructions can be given. [place] Mychart letter sent

## 2022-04-21 ENCOUNTER — Other Ambulatory Visit: Payer: Self-pay | Admitting: Cardiovascular Disease

## 2022-04-24 ENCOUNTER — Telehealth (HOSPITAL_COMMUNITY): Payer: Self-pay | Admitting: Radiology

## 2022-04-24 NOTE — Telephone Encounter (Signed)
Patient given detailed instructions per Myocardial Perfusion Study Information Sheet for the test on 04/26/2022 at 7:30. Patient notified to arrive 15 minutes early and that it is imperative to arrive on time for appointment to keep from having the test rescheduled.  If you need to cancel or reschedule your appointment, please call the office within 24 hours of your appointment. . Patient verbalized understanding.EHK

## 2022-04-26 ENCOUNTER — Encounter: Payer: Self-pay | Admitting: Physician Assistant

## 2022-04-26 ENCOUNTER — Ambulatory Visit (HOSPITAL_COMMUNITY): Payer: BC Managed Care – PPO | Attending: Physician Assistant

## 2022-04-26 ENCOUNTER — Telehealth: Payer: Self-pay | Admitting: Physician Assistant

## 2022-04-26 DIAGNOSIS — I350 Nonrheumatic aortic (valve) stenosis: Secondary | ICD-10-CM | POA: Insufficient documentation

## 2022-04-26 DIAGNOSIS — I2510 Atherosclerotic heart disease of native coronary artery without angina pectoris: Secondary | ICD-10-CM

## 2022-04-26 DIAGNOSIS — R0602 Shortness of breath: Secondary | ICD-10-CM | POA: Insufficient documentation

## 2022-04-26 LAB — MYOCARDIAL PERFUSION IMAGING
Angina Index: 0
Base ST Depression (mm): 1 mm
Duke Treadmill Score: 2
Estimated workload: 8.8
Exercise duration (min): 7 min
Exercise duration (sec): 11 s
LV dias vol: 71 mL (ref 62–150)
LV sys vol: 44 mL
MPHR: 151 {beats}/min
Nuc Stress EF: 48 % — AB
Peak HR: 144 {beats}/min
Percent HR: 95 %
Rest HR: 84 {beats}/min
Rest Nuclear Isotope Dose: 10.1 mCi
SDS: 0
SRS: 0
SSS: 0
ST Depression (mm): 1 mm
Stress Nuclear Isotope Dose: 33 mCi
TID: 1.02

## 2022-04-26 MED ORDER — TECHNETIUM TC 99M TETROFOSMIN IV KIT
33.0000 | PACK | Freq: Once | INTRAVENOUS | Status: AC | PRN
  Administered 2022-04-26: 33 via INTRAVENOUS

## 2022-04-26 MED ORDER — TECHNETIUM TC 99M TETROFOSMIN IV KIT
10.1000 | PACK | Freq: Once | INTRAVENOUS | Status: AC | PRN
  Administered 2022-04-26: 10.1 via INTRAVENOUS

## 2022-04-26 NOTE — Telephone Encounter (Signed)
The patient has been notified of the result and verbalized understanding.  All questions (if any) were answered. Antonieta Iba, RN 04/26/2022 4:23 PM   Echocardiogram has been ordered.

## 2022-04-26 NOTE — Telephone Encounter (Signed)
Patient returning call for stress test results. 

## 2022-04-26 NOTE — Telephone Encounter (Signed)
-----   Message from Liliane Shi, [place] sent at 04/26/2022  3:38 PM EDT ----- Stress shows no ischemia. EF was mildly low on stress test but this may be inadequately assessed on this study. We need to get an echocardiogram to evaluate EF.  PLAN:  -Continue current meds -Arrange echocardiogram (Dx: shortness of breath, low EF on stress test) -Send copy to PCP Richardson Dopp, PA-C    04/26/2022 3:34 PM

## 2022-05-05 ENCOUNTER — Ambulatory Visit (HOSPITAL_COMMUNITY): Payer: BC Managed Care – PPO | Attending: Cardiology

## 2022-05-05 DIAGNOSIS — I351 Nonrheumatic aortic (valve) insufficiency: Secondary | ICD-10-CM | POA: Diagnosis not present

## 2022-05-05 DIAGNOSIS — I2510 Atherosclerotic heart disease of native coronary artery without angina pectoris: Secondary | ICD-10-CM | POA: Diagnosis not present

## 2022-05-05 DIAGNOSIS — I350 Nonrheumatic aortic (valve) stenosis: Secondary | ICD-10-CM | POA: Diagnosis not present

## 2022-05-05 MED ORDER — PERFLUTREN LIPID MICROSPHERE
1.0000 mL | INTRAVENOUS | Status: AC | PRN
  Administered 2022-05-05: 2 mL via INTRAVENOUS

## 2022-05-09 ENCOUNTER — Encounter: Payer: Self-pay | Admitting: Physician Assistant

## 2022-05-09 ENCOUNTER — Telehealth: Payer: Self-pay | Admitting: Cardiovascular Disease

## 2022-05-09 DIAGNOSIS — I77810 Thoracic aortic ectasia: Secondary | ICD-10-CM

## 2022-05-09 DIAGNOSIS — I712 Thoracic aortic aneurysm, without rupture, unspecified: Secondary | ICD-10-CM | POA: Insufficient documentation

## 2022-05-09 DIAGNOSIS — Q2544 Congenital dilation of aorta: Secondary | ICD-10-CM

## 2022-05-09 HISTORY — DX: Thoracic aortic aneurysm, without rupture, unspecified: I71.20

## 2022-05-09 LAB — ECHOCARDIOGRAM COMPLETE
AR max vel: 1.21 cm2
AV Area VTI: 1.13 cm2
AV Area mean vel: 1.17 cm2
AV Mean grad: 14.6 mmHg
AV Peak grad: 26.1 mmHg
Ao pk vel: 2.55 m/s
Area-P 1/2: 2.66 cm2
S' Lateral: 2.9 cm

## 2022-05-09 NOTE — Telephone Encounter (Signed)
Pt calling to f/u on Echo results. Please advise 

## 2022-05-09 NOTE — Telephone Encounter (Signed)
See echocardiogram results. Note sent to pt via MyChart. Richardson Dopp, PA-C    05/09/2022 5:56 PM

## 2022-05-10 NOTE — Telephone Encounter (Signed)
-----   Message from Liliane Shi, [place] sent at 05/09/2022  5:55 PM EDT ----- Echo with normal EF.  Aortic valve prosthesis is functioning normally with just mild leakage.  There is moderate dilation of the aortic root and ascending aorta at 45 mm. PLAN:  -Arrange chest/aorta CTA (Dx: Dilated ascending aorta) -Send copy to PCP Richardson Dopp, PA-C    05/09/2022 5:49 PM

## 2022-05-23 ENCOUNTER — Telehealth: Payer: Self-pay | Admitting: Physical Medicine and Rehabilitation

## 2022-05-23 NOTE — Telephone Encounter (Signed)
IC talked with patient. He is wanting another injection. Previous one done in August. Ok for repeat before end of year?

## 2022-05-23 NOTE — Telephone Encounter (Signed)
IC appt scheduled with Dr Rolena Infante. Patient didn't want to wait until Dr Newtons next available on 11/1

## 2022-05-23 NOTE — Telephone Encounter (Signed)
Pt called requesting to set an injection appt. Please call pt at [phone].

## 2022-05-24 ENCOUNTER — Encounter: Payer: Self-pay | Admitting: Physician Assistant

## 2022-05-24 ENCOUNTER — Ambulatory Visit
Admission: RE | Admit: 2022-05-24 | Discharge: 2022-05-24 | Disposition: A | Discharge status: Home / Self Care | Payer: BC Managed Care – PPO | Source: Ambulatory Visit | Attending: Physician Assistant | Admitting: Physician Assistant

## 2022-05-24 DIAGNOSIS — I712 Thoracic aortic aneurysm, without rupture, unspecified: Secondary | ICD-10-CM | POA: Diagnosis not present

## 2022-05-24 DIAGNOSIS — I700 Atherosclerosis of aorta: Secondary | ICD-10-CM | POA: Diagnosis not present

## 2022-05-24 DIAGNOSIS — I2510 Atherosclerotic heart disease of native coronary artery without angina pectoris: Secondary | ICD-10-CM | POA: Diagnosis not present

## 2022-05-24 DIAGNOSIS — Q2544 Congenital dilation of aorta: Secondary | ICD-10-CM

## 2022-05-24 DIAGNOSIS — K449 Diaphragmatic hernia without obstruction or gangrene: Secondary | ICD-10-CM | POA: Diagnosis not present

## 2022-05-24 MED ORDER — IOPAMIDOL (ISOVUE-370) INJECTION 76%
75.0000 mL | Freq: Once | INTRAVENOUS | Status: AC | PRN
  Administered 2022-05-24: 75 mL via INTRAVENOUS

## 2022-05-25 ENCOUNTER — Encounter: Payer: Self-pay | Admitting: Cardiovascular Disease

## 2022-05-25 ENCOUNTER — Encounter: Payer: Self-pay | Admitting: Orthopaedic Surgery

## 2022-05-25 ENCOUNTER — Telehealth: Payer: Self-pay

## 2022-05-25 ENCOUNTER — Ambulatory Visit (INDEPENDENT_AMBULATORY_CARE_PROVIDER_SITE_OTHER): Payer: BC Managed Care – PPO | Admitting: Orthopaedic Surgery

## 2022-05-25 DIAGNOSIS — M1711 Unilateral primary osteoarthritis, right knee: Secondary | ICD-10-CM

## 2022-05-25 DIAGNOSIS — M1712 Unilateral primary osteoarthritis, left knee: Secondary | ICD-10-CM | POA: Diagnosis not present

## 2022-05-25 DIAGNOSIS — M170 Bilateral primary osteoarthritis of knee: Secondary | ICD-10-CM

## 2022-05-25 MED ORDER — BUPIVACAINE HCL 0.5 % IJ SOLN
2.0000 mL | INTRAMUSCULAR | Status: AC | PRN
  Administered 2022-05-25: 2 mL via INTRA_ARTICULAR

## 2022-05-25 MED ORDER — LIDOCAINE HCL 1 % IJ SOLN
2.0000 mL | INTRAMUSCULAR | Status: AC | PRN
  Administered 2022-05-25: 2 mL

## 2022-05-25 MED ORDER — METHYLPREDNISOLONE ACETATE 40 MG/ML IJ SUSP
40.0000 mg | INTRAMUSCULAR | Status: AC | PRN
  Administered 2022-05-25: 40 mg via INTRA_ARTICULAR

## 2022-05-25 NOTE — Progress Notes (Signed)
Office Visit Note   Patient: [name]           Date of Birth: Jun 15, 1951           MRN: [id number] Visit Date: 05/25/2022              Requested by: Caren Macadam, MD [place],  [place] [address] PCP: Caren Macadam, MD   Assessment & Plan: Visit Diagnoses:  1. Bilateral primary osteoarthritis of knee     Plan: Bilateral knee injections performed today.  We will obtain [employer] authorization today.  This patient is diagnosed with osteoarthritis of the knee(s).    Radiographs show evidence of joint space narrowing, osteophytes, subchondral sclerosis and/or subchondral cysts.  This patient has knee pain which interferes with functional and activities of daily living.    This patient has experienced inadequate response, adverse effects and/or intolerance with conservative treatments such as acetaminophen, NSAIDS, topical creams, physical therapy or regular exercise, knee bracing and/or weight loss.   This patient has experienced inadequate response or has a contraindication to intra articular steroid injections for at least 3 months.   This patient is not scheduled to have a total knee replacement within 6 months of starting treatment with viscosupplementation.  Follow-Up Instructions: No follow-ups on file.   Orders:  No orders of the defined types were placed in this encounter.  No orders of the defined types were placed in this encounter.     Procedures: Large Joint Inj: bilateral knee on 05/25/2022 8:23 AM Indications: pain Details: 22 G needle  Arthrogram: No  Medications (Right): 2 mL lidocaine 1 %; 2 mL bupivacaine 0.5 %; 40 mg methylPREDNISolone acetate 40 MG/ML Medications (Left): 2 mL lidocaine 1 %; 2 mL bupivacaine 0.5 %; 40 mg methylPREDNISolone acetate 40 MG/ML Outcome: tolerated well, no immediate complications Patient was prepped and draped in the usual sterile fashion.       Clinical Data: No additional  findings.   Subjective: Chief Complaint  Patient presents with   Left Knee - Pain   Right Knee - Pain    HPI [name] comes back today for bilateral knee pain.  He requests repeat cortisone injections.  Previous ones were about 3 months ago. Review of Systems   Objective: Vital Signs: There were no vitals taken for this visit.  Physical Exam  Ortho Exam Examination of the knees is unchanged. Specialty Comments:  No specialty comments available.  Imaging: CT ANGIO CHEST AORTA W/CM & OR WO/CM  Result Date: 05/24/2022 CLINICAL DATA:  Aortic atherosclerosis, dilated ascending thoracic aorta EXAM: CT ANGIOGRAPHY CHEST WITH CONTRAST TECHNIQUE: Multidetector CT imaging of the chest was performed using the standard protocol during bolus administration of intravenous contrast. Multiplanar CT image reconstructions and MIPs were obtained to evaluate the vascular anatomy. RADIATION DOSE REDUCTION: This exam was performed according to the departmental dose-optimization program which includes automated exposure control, adjustment of the mA and/or kV according to patient size and/or use of iterative reconstruction technique. CONTRAST:  36m ISOVUE-370 IOPAMIDOL (ISOVUE-370) INJECTION 76% COMPARISON:  Remote prior CT scan of the chest 07/14/2011 FINDINGS: Cardiovascular: Conventional 3 vessel arch anatomy. Surgical changes of prior aortic valve replacement. Expected appearance of postsurgical changes and pledget. Mild aneurysmal dilation of the tubular portion of the ascending thoracic aorta with a maximal diameter of 4.1 cm. Trace atherosclerotic plaque. No dissection. Calcifications present throughout the coronary arteries. The heart is normal in size. No pericardial effusion. Mediastinum/Nodes: Unremarkable CT appearance of the thyroid  gland. No suspicious mediastinal or hilar adenopathy. No soft tissue mediastinal mass. Moderate hiatal hernia. Lungs/Pleura: Lungs are essentially clear. Small 5 mm  subpleural pulmonary nodule along the left major fissure (image 76 series 7). Per Fleischner Society guidelines, no routine follow-up imaging is recommended. Upper Abdomen: No acute abnormality. 1.6 cm pancreatic cystic lesion in the pancreatic tail the similar compared to prior MRI. Partially imaged benign renal cysts again noted. No imaging follow-up required. Musculoskeletal: No acute fracture or aggressive appearing lytic or blastic osseous lesion. Review of the MIP images confirms the above findings. IMPRESSION: 1. Mild aneurysmal dilation of the ascending thoracic aorta at 4.1 cm. Recommend annual imaging followup by CTA or MRA. This recommendation follows 2010 ACCF/AHA/AATS/ACR/ASA/SCA/SCAI/SIR/STS/SVM Guidelines for the Diagnosis and Management of Patients with Thoracic Aortic Disease. Circulation. 2010; 121: Y185-U314. Aortic aneurysm NOS (ICD10-I71.9) 2. Aortic and coronary artery atherosclerotic vascular calcifications. 3. Surgical changes of prior aortic valve replacement. 4. Moderate hiatal hernia. 5. Additional ancillary findings as above. Aortic Atherosclerosis (ICD10-I70.0). Signed, Criselda Peaches, MD, [facility] Electronically Signed   By: Jacqulynn Cadet M.D.   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 07/07/2021   Cervical stenosis of spinal canal 06/07/2021   Meningeal irritation 06/07/2021   Idiopathic intracranial hypotension 06/07/2021   Cervicalgia 03/07/2021   Chronic daily headache 03/07/2021   Migraine 01/10/2021   Obstructive sleep apnea 01/10/2021    Renal stones 10/04/2020   Type 2 diabetes mellitus with unspecified complications ([place]) 97/08/6376   Benign prostatic hyperplasia without lower urinary tract symptoms 09/30/2020   GERD (gastroesophageal reflux disease) 05/05/2020   Irritable bowel syndrome 02/19/2020   Dependence on other enabling machines and devices 02/19/2020   ED (erectile dysfunction) of organic origin 02/19/2020   Major depression single episode, in partial remission ([place]) 02/19/2020   Obesity 02/19/2020   Chronic right-sided low back pain without sciatica 12/16/2019   Primary osteoarthritis of left knee 09/16/2019   Rotator cuff tear arthropathy of left shoulder 09/16/2019   Primary osteoarthritis of right knee 09/16/2019   Complex tear of medial meniscus of right knee as current injury 03/04/2019   Chronic diastolic heart failure ([place]) 11/25/2018   Bruxism (teeth grinding) 11/25/2018   Cephalalgia 11/25/2018   Cerebrovascular disease 11/25/2018   Morning headache 11/25/2018   Sleep related headaches 09/26/2018   Chronic idiopathic gout without tophus 02/13/2018   Hypertension    History of adenomatous polyp of colon 03/14/2016   Thrombosed hemorrhoids 03/01/2016   Encounter for long-term (current) use of high-risk medication 06/30/2015   Psoriasis 12/25/2014   Major depressive disorder, recurrent, in full remission ([place]) 06/22/2014   Gout 04/21/2014   Chest pain, somewhat atypical 12/28/2013   Dyspnea on exertion 12/27/2011   Dizziness 08/15/2011   Heart valve replaced by other means 07/28/2011   Aortic stenosis, severe 07/18/2011   Coronary artery disease 07/06/2011   Peripheral vascular disease ([place]) 07/06/2011   Hyperlipidemia 07/06/2011   Bicuspid aortic valve 07/06/2011   Past Medical History:  Diagnosis Date   Aortic stenosis    a. severe bicuspid AV stenosis s/p pericardial tissue valve 2012.  Arthritis    Coronary artery disease    a. s/p CABG (LIMA-LCx) at time of AVR 2012, mild  nonobstructive LAD and RCA stenoses. // Myoview 03/2022: EF 40, no ischemia; low risk   Depression    Depression    Diabetes mellitus without complication ([place])    ED (erectile dysfunction)    Fatty liver    Femoral-popliteal bypass graft occlusion, right [facility]) 1997   Dr. 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   Obesity    Obstructive sleep apnea 01/10/2021   PAD (peripheral artery disease) (HCC)    PONV (postoperative nausea and vomiting)    PVD (peripheral vascular disease) (HCC)    Thoracic aortic aneurysm ([place]) 05/09/2022   Echocardiogram 10/23: EF 55-60, no RWMA, mild LVH, normal RVSF, mild MR, s/p AVR with mild AI and normal gradient (15 mmHg), mod dilation of aortic root and ascending aorta (45 mm).  Chest CT 10/23: Ascending thoracic aorta 4.1 cm; aortic atherosclerosis    Family History  Problem Relation Age of Onset   Hypertension Father    Diabetes Father    Lung cancer Father    Breast cancer Sister    Healthy Brother    Heart disease Maternal Grandmother    Healthy Maternal Grandfather    Cancer Maternal Grandfather    Migraines Neg Hx    Headache Neg Hx    Colon cancer Neg Hx    Pancreatic cancer Neg Hx    Stomach cancer Neg Hx    Esophageal cancer Neg Hx    Sleep apnea Neg Hx     Past Surgical History:  Procedure Laterality Date   AORTIC VALVE REPLACEMENT  07/18/2011   Procedure: AORTIC VALVE REPLACEMENT (AVR);  Surgeon: Grace Isaac, MD;  Location: [place];  Service: Open Heart Surgery;  Laterality: N/A;   CARDIAC CATHETERIZATION  2009, 07/11/11   Dr. Irish Lack   CORONARY ARTERY BYPASS GRAFT  07/18/2011   Procedure: CORONARY ARTERY BYPASS  GRAFTING (CABG);  Surgeon: Grace Isaac, MD;  Location: [place];  Service: Open Heart Surgery;  Laterality: N/A;  times one to mammary artery, left   fem stent Right 2017   FEMORAL ARTERY - POPLITEAL ARTERY BYPASS GRAFT     KNEE ARTHROSCOPY     left   MRI     to visualize aortic valve   PERIPHERAL VASCULAR CATHETERIZATION N/A 07/12/2016   Procedure: Abdominal Aortogram w/Lower Extremity;  Surgeon: Waynetta Sandy, MD;  Location: [facility];  Service: Cardiovascular;  Laterality: N/A;   PERIPHERAL VASCULAR CATHETERIZATION Right 07/12/2016   Procedure: Peripheral Vascular Atherectomy;  Surgeon: Waynetta Sandy, MD;  Location: [facility];  Service: Cardiovascular;  Laterality: Right;  Anterior Tibial and Popliteal   ROTATOR CUFF REPAIR     Left   US ECHOCARDIOGRAPHY     Social History   Occupational History   Occupation: Horticulturist, commercial: [employer]   Tobacco Use   Smoking status: Never    Passive exposure: Never   Smokeless tobacco: Never  Vaping Use   Vaping Use: Never used  Substance and Sexual Activity   Alcohol use: No   Drug use: No   Sexual activity: Not on file

## 2022-05-25 NOTE — Telephone Encounter (Signed)
Please precert for bilateral knee gel injection. Dr.Xu's patient. Thanks!

## 2022-05-26 ENCOUNTER — Ambulatory Visit: Payer: Self-pay

## 2022-05-26 ENCOUNTER — Ambulatory Visit (INDEPENDENT_AMBULATORY_CARE_PROVIDER_SITE_OTHER): Payer: BC Managed Care – PPO | Admitting: Sports Medicine

## 2022-05-26 ENCOUNTER — Telehealth: Payer: Self-pay

## 2022-05-26 ENCOUNTER — Encounter: Payer: Self-pay | Admitting: Sports Medicine

## 2022-05-26 DIAGNOSIS — G8929 Other chronic pain: Secondary | ICD-10-CM | POA: Diagnosis not present

## 2022-05-26 DIAGNOSIS — M25512 Pain in left shoulder: Secondary | ICD-10-CM | POA: Diagnosis not present

## 2022-05-26 DIAGNOSIS — Q2544 Congenital dilation of aorta: Secondary | ICD-10-CM

## 2022-05-26 MED ORDER — METHYLPREDNISOLONE ACETATE 40 MG/ML IJ SUSP
40.0000 mg | INTRAMUSCULAR | Status: AC | PRN
  Administered 2022-05-26: 40 mg via INTRA_ARTICULAR

## 2022-05-26 MED ORDER — LIDOCAINE HCL 1 % IJ SOLN
2.0000 mL | INTRAMUSCULAR | Status: AC | PRN
  Administered 2022-05-26: 2 mL

## 2022-05-26 MED ORDER — BUPIVACAINE HCL 0.25 % IJ SOLN
2.0000 mL | INTRAMUSCULAR | Status: AC | PRN
  Administered 2022-05-26: 2 mL via INTRA_ARTICULAR

## 2022-05-26 NOTE — Telephone Encounter (Signed)
Patient notified via [place]. Repeat CTA has been ordered.

## 2022-05-26 NOTE — Telephone Encounter (Signed)
-----   Message from Liliane Shi, PA-C sent at 05/24/2022  5:22 PM EDT ----- Results sent to [name] via [place]. See MyChart comment. PLAN: -Schedule repeat gated chest/aorta CTA in 1 year -Send copy to PCP  Richardson Dopp, PA-C    05/24/2022 5:20 PM

## 2022-05-26 NOTE — Progress Notes (Signed)
   Procedure Note  Patient: [name]             Date of Birth: 1950/11/23           MRN: [id number]             Visit Date: 05/26/2022  Procedures: Visit Diagnoses:  1. Chronic left shoulder pain    Large Joint Inj: L glenohumeral on 05/26/2022 1:08 PM Indications: pain Details: 22 G 3.5 in needle, ultrasound-guided posterior approach Medications: 2 mL lidocaine 1 %; 2 mL bupivacaine 0.25 %; 40 mg methylPREDNISolone acetate 40 MG/ML Outcome: tolerated well, no immediate complications  US-guided glenohumeral joint injection, Left shoulder After discussion on risks/benefits/indications, informed verbal consent was obtained. A timeout was then performed. The patient was positioned lying lateral recumbent on examination table. The patient's shoulder was prepped with betadine and multiple alcohol swabs and utilizing ultrasound guidance, the patient's glenohumeral joint was identified on ultrasound. Using ultrasound guidance a 22-gauge, 3.5 inch needle with a mixture of 2:2:1 cc's lidocaine:bupivicaine:depomedrol was directed from a lateral to medial direction via in-plane technique into the glenohumeral joint with visualization of appropriate spread of injectate into the joint. Patient tolerated the procedure well without immediate complications.      Procedure, treatment alternatives, risks and benefits explained, specific risks discussed. Consent was given by the patient. Immediately prior to procedure a time out was called to verify the correct patient, procedure, equipment, support staff and site/side marked as required. Patient was prepped and draped in the usual sterile fashion.     - I evaluated the patient about 10 minutes post-injection and he had some improvement in pain and range of motion - follow-up with Dr. Erlinda Hong as indicated; I am happy to see them as needed  Elba Barman, DO [place]  This note was  dictated using Dragon naturally speaking software and may contain errors in syntax, spelling, or content which have not been identified prior to signing this note.

## 2022-05-29 NOTE — Telephone Encounter (Signed)
Will submit later, [employer] portal is currently down.

## 2022-05-29 NOTE — Telephone Encounter (Signed)
VOB submitted [employer], bilateral knee.

## 2022-06-02 DIAGNOSIS — E119 Type 2 diabetes mellitus without complications: Secondary | ICD-10-CM | POA: Diagnosis not present

## 2022-06-02 DIAGNOSIS — E782 Mixed hyperlipidemia: Secondary | ICD-10-CM | POA: Diagnosis not present

## 2022-06-02 DIAGNOSIS — I10 Essential (primary) hypertension: Secondary | ICD-10-CM | POA: Diagnosis not present

## 2022-06-02 DIAGNOSIS — Z23 Encounter for immunization: Secondary | ICD-10-CM | POA: Diagnosis not present

## 2022-06-13 ENCOUNTER — Telehealth: Payer: Self-pay

## 2022-06-13 NOTE — Patient Outreach (Signed)
  Care Coordination   06/13/2022 Name: [name] MRN: [id number] DOB: May 06, 1951   Care Coordination Outreach Attempts:  An unsuccessful telephone outreach was attempted today to offer the patient information about available care coordination services as a benefit of their health plan.   Follow Up Plan:  Additional outreach attempts will be made to offer the patient care coordination information and services.   Encounter Outcome:  No Answer  Care Coordination Interventions Activated:  No   Care Coordination Interventions:  No, not indicated    Jone Baseman, RN, MSN [facility] Care Management Care Management Coordinator Direct Line [phone]

## 2022-06-21 DIAGNOSIS — L4050 Arthropathic psoriasis, unspecified: Secondary | ICD-10-CM | POA: Diagnosis not present

## 2022-06-21 DIAGNOSIS — Z79899 Other long term (current) drug therapy: Secondary | ICD-10-CM | POA: Diagnosis not present

## 2022-07-03 DIAGNOSIS — S0033XA Contusion of nose, initial encounter: Secondary | ICD-10-CM | POA: Diagnosis not present

## 2022-07-03 DIAGNOSIS — S0031XA Abrasion of nose, initial encounter: Secondary | ICD-10-CM | POA: Diagnosis not present

## 2022-07-04 ENCOUNTER — Encounter: Payer: Self-pay | Admitting: Gastroenterology

## 2022-07-06 ENCOUNTER — Telehealth: Payer: Self-pay

## 2022-07-06 NOTE — Patient Instructions (Signed)
Visit Information  Thank you for taking time to visit with me today. Please don't hesitate to contact me if I can be of assistance to you.   Following are the goals we discussed today:   Goals Addressed             This Visit's Progress    COMPLETED: Care Coordination Activities-No follow up required       Care Coordination Interventions: Advised patient to schedule annual exam and flu vaccine.  Discussed [facility] services and support.  Patient decline.             If you are experiencing a Mental Health or [place] or need someone to talk to, please call the Suicide and Crisis Lifeline: 988   Patient verbalizes understanding of instructions and care plan provided today and agrees to view in [place]. Active MyChart status and patient understanding of how to access instructions and care plan via MyChart confirmed with patient.     No further follow up required: decline  Jone Baseman, RN, MSN [place] Management Care Management Coordinator Direct Line [phone]

## 2022-07-06 NOTE — Patient Outreach (Signed)
  Care Coordination   Initial Visit Note   07/06/2022 Name: PHUOC HUY MRN: [id number] DOB: 1951-02-13  LAQUINTON BIHM is a 71 y.o. year old male who sees Caren Macadam, MD for primary care. I spoke with  [name] by phone today.  What matters to the patients health and wellness today?  none    Goals Addressed             This Visit's Progress    COMPLETED: Care Coordination Activities-No follow up required       Care Coordination Interventions: Advised patient to schedule annual exam and flu vaccine.  Discussed [facility] services and support.  Patient decline.           SDOH assessments and interventions completed:  Yes  SDOH Interventions Today    Flowsheet Row Most Recent Value  SDOH Interventions   Housing Interventions Intervention Not Indicated  Transportation Interventions Intervention Not Indicated        Care Coordination Interventions:  Yes, provided   Follow up plan: No further intervention required.   Encounter Outcome:  Pt. Visit Completed   Jone Baseman, RN, MSN [place] Management Care Management Coordinator Direct Line [phone]

## 2022-08-01 ENCOUNTER — Telehealth: Payer: Self-pay | Admitting: Adult Health

## 2022-08-01 ENCOUNTER — Other Ambulatory Visit: Payer: Self-pay | Admitting: Cardiovascular Disease

## 2022-08-01 NOTE — Telephone Encounter (Signed)
New, Willodean Rosenthal, RN; Redmond Pulling, Jake Shark Received, Thank you!     Previous Messages    ----- Message ----- From: Brandon Melnick, RN Sent: 08/01/2022   4:07 PM EST To: Darlina Guys; Miquel Dunn; Nash Shearer; * Subject: new head gear                                  Good afternoon,  Happy New Year.  There is an order from 12-13-2021 that includes head gear.  I believe the order is good for a year.  Can this order be used for his request?    [place]" Male, 72 y.o., 08/15/1950 MRN: [id number] Phone: [phone]   Thank you,  Newman Pies

## 2022-08-01 NOTE — Telephone Encounter (Signed)
I sent message to [employer] for head gear.  (Last order 12-13-2021 (good for 12 months).

## 2022-08-01 NOTE — Telephone Encounter (Signed)
Pt states he needs new head gear for cpap

## 2022-08-14 ENCOUNTER — Telehealth: Payer: Self-pay

## 2022-08-14 NOTE — Telephone Encounter (Signed)
Resubmitted VOB to [employer], due to [facility] not being a preferred product.

## 2022-08-15 DIAGNOSIS — M79645 Pain in left finger(s): Secondary | ICD-10-CM | POA: Diagnosis not present

## 2022-08-15 DIAGNOSIS — M65842 Other synovitis and tenosynovitis, left hand: Secondary | ICD-10-CM | POA: Diagnosis not present

## 2022-08-17 ENCOUNTER — Telehealth: Payer: Self-pay | Admitting: Gastroenterology

## 2022-08-17 NOTE — Telephone Encounter (Signed)
Received a message from [facility] nurse  to check with patient to see if patient was on blood thinner. I had to leave a voicemail, and also left a note in the appointment notes. Advised patient to call back to let us know and let him know that if he is on a blood thinner that he would need OV. Just an FYI.   I will try and reach out to pt again tomorrow to follow up.

## 2022-08-18 ENCOUNTER — Telehealth: Payer: Self-pay | Admitting: *Deleted

## 2022-08-18 NOTE — Telephone Encounter (Signed)
Call to verify if on blood thinners.. Unable to reach LM with call back #.  Pt's chart shows he is on blood thinners attempting to reach pt to clarify if he is or not on them and if requires and OV prior to procedure.

## 2022-08-21 ENCOUNTER — Telehealth: Payer: Self-pay | Admitting: *Deleted

## 2022-08-21 NOTE — Telephone Encounter (Signed)
Left Message - Called to verify if pt is on a anticoagulant or not. Unable to reach. LM with # for call back.

## 2022-08-21 NOTE — Telephone Encounter (Signed)
Patient returned call, stated he just takes a daily aspirin. No blood thinner.

## 2022-08-22 ENCOUNTER — Other Ambulatory Visit: Payer: Self-pay

## 2022-08-22 DIAGNOSIS — M170 Bilateral primary osteoarthritis of knee: Secondary | ICD-10-CM

## 2022-08-25 ENCOUNTER — Ambulatory Visit (AMBULATORY_SURGERY_CENTER): Payer: BC Managed Care – PPO | Admitting: *Deleted

## 2022-08-25 ENCOUNTER — Telehealth: Payer: Self-pay | Admitting: *Deleted

## 2022-08-25 VITALS — Ht 72.0 in | Wt 204.0 lb

## 2022-08-25 DIAGNOSIS — Z86010 Personal history of colonic polyps: Secondary | ICD-10-CM

## 2022-08-25 MED ORDER — NA SULFATE-K SULFATE-MG SULF 17.5-3.13-1.6 GM/177ML PO SOLN: 1.0000 | Freq: Once | ORAL | 0 refills | Status: AC

## 2022-08-25 NOTE — Telephone Encounter (Signed)
LM with pt including the # for call back.

## 2022-08-25 NOTE — Progress Notes (Addendum)
No egg or soy allergy known to patient  No issues known to pt with past sedation with any surgeries or procedures Patient denies ever being told they had issues or difficulty with intubation  No FH of Malignant Hyperthermia Pt is not on diet pills Pt is not on  home 02  Pt is not on blood thinners only ASA per pt Pt has some issues with constipation  take fiber daily to help.  Instructed to do Miralax 5 days prior to procedure and why not to take fiber.  Pt stated he understood and will do Miralax.  Pt is not on dialysis Pt denies any upcoming cardiac testing Pt encouraged to use to use Singlecare or Goodrx to reduce cost  Patient's chart reviewed by [name] CNRA prior to previsit and patient appropriate for the [place].  Previsit completed and red dot placed by patient's name on their procedure day (on provider's schedule).  . Visit in person. No issues with gait or standing Instructions reviewed with pt and pt states understanding. Instructed to review again prior to procedure. Pt states they will.  Instructions sent by mail

## 2022-08-31 ENCOUNTER — Ambulatory Visit: Payer: BC Managed Care – PPO | Admitting: Orthopaedic Surgery

## 2022-08-31 DIAGNOSIS — M1712 Unilateral primary osteoarthritis, left knee: Secondary | ICD-10-CM | POA: Diagnosis not present

## 2022-08-31 DIAGNOSIS — M170 Bilateral primary osteoarthritis of knee: Secondary | ICD-10-CM

## 2022-08-31 DIAGNOSIS — M1711 Unilateral primary osteoarthritis, right knee: Secondary | ICD-10-CM | POA: Diagnosis not present

## 2022-08-31 MED ORDER — SODIUM HYALURONATE 60 MG/3ML IX PRSY
60.0000 mg | PREFILLED_SYRINGE | INTRA_ARTICULAR | Status: AC | PRN
  Administered 2022-08-31: 60 mg via INTRA_ARTICULAR

## 2022-08-31 MED ORDER — LIDOCAINE HCL 1 % IJ SOLN
2.0000 mL | INTRAMUSCULAR | Status: AC | PRN
  Administered 2022-08-31: 2 mL

## 2022-08-31 MED ORDER — BUPIVACAINE HCL 0.5 % IJ SOLN
2.0000 mL | INTRAMUSCULAR | Status: AC | PRN
  Administered 2022-08-31: 2 mL via INTRA_ARTICULAR

## 2022-08-31 NOTE — Progress Notes (Signed)
Office Visit Note   Patient: [name]           Date of Birth: Jul 30, 1951           MRN: [id number] Visit Date: 08/31/2022              Requested by: Caren Macadam, MD [address],  [place] [address] PCP: Caren Macadam, MD   Assessment & Plan: Visit Diagnoses:  1. Bilateral primary osteoarthritis of knee     Plan: Bilateral durolane injections performed today.  Tolerated well.  Follow up as needed.  Follow-Up Instructions: No follow-ups on file.   Orders:  No orders of the defined types were placed in this encounter.  No orders of the defined types were placed in this encounter.     Procedures: Large Joint Inj: bilateral knee on 08/31/2022 9:31 AM Indications: pain Details: 22 G needle  Arthrogram: No  Medications (Right): 2 mL lidocaine 1 %; 2 mL bupivacaine 0.5 %; 60 mg Sodium Hyaluronate 60 MG/3ML Medications (Left): 2 mL lidocaine 1 %; 2 mL bupivacaine 0.5 %; 60 mg Sodium Hyaluronate 60 MG/3ML Outcome: tolerated well, no immediate complications Patient was prepped and draped in the usual sterile fashion.       Clinical Data: No additional findings.   Subjective: Chief Complaint  Patient presents with   Right Knee - Pain   Left Knee - Pain    HPI [name] is here for durolane injections  Review of Systems   Objective: Vital Signs: There were no vitals taken for this visit.  Physical Exam  Ortho Exam  Specialty Comments:  No specialty comments available.  Imaging: No results found.   PMFS History: Patient Active Problem List   Diagnosis Date Noted   Thoracic aortic aneurysm ([facility]) 05/09/2022   Abnormal MRI, neck 11/16/2021   Rotator cuff arthropathy of left shoulder 08/17/2021   Chronic left shoulder pain 08/17/2021   Chronic frontal sinusitis 07/07/2021   Facial paresthesia 07/07/2021   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 07/07/2021   Cervical stenosis of spinal canal 06/07/2021    Meningeal irritation 06/07/2021   Idiopathic intracranial hypotension 06/07/2021   Cervicalgia 03/07/2021   Chronic daily headache 03/07/2021   Migraine 01/10/2021   Obstructive sleep apnea 01/10/2021   Renal stones 10/04/2020   Type 2 diabetes mellitus with unspecified complications ([place]) 34/74/2595   Benign prostatic hyperplasia without lower urinary tract symptoms 09/30/2020   GERD (gastroesophageal reflux disease) 05/05/2020   Irritable bowel syndrome 02/19/2020   Dependence on other enabling machines and devices 02/19/2020   ED (erectile dysfunction) of organic origin 02/19/2020   Major depression single episode, in partial remission ([facility]) 02/19/2020   Obesity 02/19/2020   Chronic right-sided low back pain without sciatica 12/16/2019   Primary osteoarthritis of left knee 09/16/2019   Rotator cuff tear arthropathy of left shoulder 09/16/2019   Primary osteoarthritis of right knee 09/16/2019   Complex tear of medial meniscus of right knee as current injury 03/04/2019   Chronic diastolic heart failure ([facility]) 11/25/2018   Bruxism (teeth grinding) 11/25/2018   Cephalalgia 11/25/2018   Cerebrovascular disease 11/25/2018   Morning headache 11/25/2018   Sleep related headaches 09/26/2018   Chronic idiopathic gout without tophus 02/13/2018   Hypertension    History of adenomatous polyp of colon 03/14/2016   Thrombosed hemorrhoids 03/01/2016   Encounter for long-term (current) use of high-risk medication 06/30/2015   Psoriasis 12/25/2014   Major depressive disorder, recurrent, in full  remission ([facility]) 06/22/2014   Gout 04/21/2014   Chest pain, somewhat atypical 12/28/2013   Dyspnea on exertion 12/27/2011   Dizziness 08/15/2011   Heart valve replaced by other means 07/28/2011   Aortic stenosis, severe 07/18/2011   Coronary artery disease 07/06/2011   Peripheral vascular disease ([facility]) 07/06/2011   Hyperlipidemia 07/06/2011   Bicuspid aortic valve 07/06/2011   Past Medical History:   Diagnosis Date   Aortic stenosis    a. severe bicuspid AV stenosis s/p pericardial tissue valve 2012.   Arthritis    Coronary artery disease    a. s/p CABG (LIMA-LCx) at time of AVR 2012, mild nonobstructive LAD and RCA stenoses. // Myoview 03/2022: EF 35, no ischemia; low risk   Depression    Depression    Diabetes mellitus without complication ([facility])    ED (erectile dysfunction)    Encounter for long-term (current) use of high-risk medication 06/30/2015   Fatty liver    Femoral-popliteal bypass graft occlusion, right [facility]) 1997   Dr. 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   Obesity    Obstructive sleep apnea 01/10/2021   PAD (peripheral artery disease) (HCC)    PONV (postoperative nausea and vomiting)    PVD (peripheral vascular disease) (HCC)    Thoracic aortic aneurysm ([facility]) 05/09/2022   Echocardiogram 10/23: EF 55-60, no RWMA, mild LVH, normal RVSF, mild MR, s/p AVR with mild AI and normal gradient (15 mmHg), mod dilation of aortic root and ascending aorta (45 mm).  Chest CT 10/23: Ascending thoracic aorta 4.1 cm; aortic atherosclerosis    Family History  Problem Relation Age of Onset   Hypertension Father    Diabetes Father    Lung cancer Father    Breast cancer Sister    Healthy Brother    Heart disease Maternal Grandmother    Healthy Maternal Grandfather    Cancer Maternal Grandfather    Migraines Neg Hx    Headache Neg Hx    Colon cancer Neg Hx    Pancreatic cancer Neg Hx    Stomach cancer Neg Hx    Esophageal cancer Neg Hx    Sleep apnea Neg Hx    Colon polyps Neg Hx    Rectal cancer Neg Hx     Past Surgical History:  Procedure  Laterality Date   AORTIC VALVE REPLACEMENT  07/18/2011   Procedure: AORTIC VALVE REPLACEMENT (AVR);  Surgeon: Grace Isaac, MD;  Location: [facility];  Service: Open Heart Surgery;  Laterality: N/A;   CARDIAC CATHETERIZATION  2009, 07/11/11   Dr. Irish Lack   CORONARY ARTERY BYPASS GRAFT  07/18/2011   Procedure: CORONARY ARTERY BYPASS GRAFTING (CABG);  Surgeon: Grace Isaac, MD;  Location: [facility];  Service: Open Heart Surgery;  Laterality: N/A;  times one to mammary artery, left   fem stent Right 2017   FEMORAL ARTERY - POPLITEAL ARTERY BYPASS GRAFT     KNEE ARTHROSCOPY     left   MRI     to visualize aortic valve   PERIPHERAL VASCULAR  CATHETERIZATION N/A 07/12/2016   Procedure: Abdominal Aortogram w/Lower Extremity;  Surgeon: Waynetta Sandy, MD;  Location: [facility];  Service: Cardiovascular;  Laterality: N/A;   PERIPHERAL VASCULAR CATHETERIZATION Right 07/12/2016   Procedure: Peripheral Vascular Atherectomy;  Surgeon: Waynetta Sandy, MD;  Location: [facility];  Service: Cardiovascular;  Laterality: Right;  Anterior Tibial and Popliteal   ROTATOR CUFF REPAIR     Left   US ECHOCARDIOGRAPHY     Social History   Occupational History   Occupation: Horticulturist, commercial: [employer]   Tobacco Use   Smoking status: Never    Passive exposure: Never   Smokeless tobacco: Never  Vaping Use   Vaping Use: Never used  Substance and Sexual Activity   Alcohol use: No   Drug use: No   Sexual activity: Not on file

## 2022-09-01 DIAGNOSIS — I10 Essential (primary) hypertension: Secondary | ICD-10-CM | POA: Diagnosis not present

## 2022-09-01 DIAGNOSIS — R519 Headache, unspecified: Secondary | ICD-10-CM | POA: Diagnosis not present

## 2022-09-01 DIAGNOSIS — G8929 Other chronic pain: Secondary | ICD-10-CM | POA: Diagnosis not present

## 2022-09-01 DIAGNOSIS — E782 Mixed hyperlipidemia: Secondary | ICD-10-CM | POA: Diagnosis not present

## 2022-09-05 ENCOUNTER — Encounter: Payer: Self-pay | Admitting: Gastroenterology

## 2022-09-15 ENCOUNTER — Ambulatory Visit (AMBULATORY_SURGERY_CENTER): Payer: BC Managed Care – PPO | Admitting: Gastroenterology

## 2022-09-15 ENCOUNTER — Encounter: Payer: Self-pay | Admitting: Gastroenterology

## 2022-09-15 VITALS — BP 125/87 | HR 66 | Temp 97.5°F | Resp 12 | Ht 72.0 in | Wt 204.0 lb

## 2022-09-15 DIAGNOSIS — D122 Benign neoplasm of ascending colon: Secondary | ICD-10-CM

## 2022-09-15 DIAGNOSIS — D125 Benign neoplasm of sigmoid colon: Secondary | ICD-10-CM

## 2022-09-15 DIAGNOSIS — Z86010 Personal history of colonic polyps: Secondary | ICD-10-CM

## 2022-09-15 DIAGNOSIS — D123 Benign neoplasm of transverse colon: Secondary | ICD-10-CM

## 2022-09-15 DIAGNOSIS — Z860101 Personal history of adenomatous and serrated colon polyps: Secondary | ICD-10-CM

## 2022-09-15 DIAGNOSIS — D124 Benign neoplasm of descending colon: Secondary | ICD-10-CM | POA: Diagnosis not present

## 2022-09-15 DIAGNOSIS — Z09 Encounter for follow-up examination after completed treatment for conditions other than malignant neoplasm: Secondary | ICD-10-CM

## 2022-09-15 MED ORDER — SODIUM CHLORIDE 0.9 % IV SOLN: 500.0000 mL | Freq: Once | INTRAVENOUS | Status: DC

## 2022-09-15 NOTE — Progress Notes (Unsigned)
GASTROENTEROLOGY PROCEDURE H&P NOTE   Primary Care Physician: Caren Macadam, MD  HPI: [name] is a 72 y.o. male who presents for Colonoscopy for surveillance.  Past Medical History:  Diagnosis Date   Aortic stenosis    a. severe bicuspid AV stenosis s/p pericardial tissue valve 2012.   Arthritis    Coronary artery disease    a. s/p CABG (LIMA-LCx) at time of AVR 2012, mild nonobstructive LAD and RCA stenoses. // Myoview 03/2022: EF 42, no ischemia; low risk   Depression    Depression    Diabetes mellitus without complication ([place])    ED (erectile dysfunction)    Encounter for long-term (current) use of high-risk medication 06/30/2015   Fatty liver    Femoral-popliteal bypass graft occlusion, right [facility]) 1997   Dr. 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   Obesity    Obstructive sleep apnea 01/10/2021   PAD (peripheral artery disease) (HCC)    PONV (postoperative nausea and vomiting)    PVD (peripheral vascular disease) (HCC)    Thoracic aortic aneurysm ([place]) 05/09/2022   Echocardiogram 10/23: EF 55-60, no RWMA, mild LVH, normal RVSF, mild MR, s/p AVR with mild AI and normal gradient (15 mmHg), mod dilation of aortic root and ascending aorta (45 mm).  Chest CT 10/23: Ascending thoracic aorta 4.1 cm; aortic atherosclerosis   Past Surgical History:  Procedure Laterality Date   AORTIC VALVE REPLACEMENT  07/18/2011   Procedure: AORTIC VALVE REPLACEMENT (AVR);  Surgeon: Grace Isaac, MD;  Location: [facility];  Service: Open Heart Surgery;  Laterality: N/A;   CARDIAC CATHETERIZATION  2009, 07/11/11   Dr. Irish Lack   CORONARY ARTERY BYPASS  GRAFT  07/18/2011   Procedure: CORONARY ARTERY BYPASS GRAFTING (CABG);  Surgeon: Grace Isaac, MD;  Location: [facility];  Service: Open Heart Surgery;  Laterality: N/A;  times one to mammary artery, left   fem stent Right 2017   FEMORAL ARTERY - POPLITEAL ARTERY BYPASS GRAFT     KNEE ARTHROSCOPY     left   MRI     to visualize aortic valve   PERIPHERAL VASCULAR CATHETERIZATION N/A 07/12/2016   Procedure: Abdominal Aortogram w/Lower Extremity;  Surgeon: Waynetta Sandy, MD;  Location: [facility];  Service: Cardiovascular;  Laterality: N/A;   PERIPHERAL VASCULAR CATHETERIZATION Right 07/12/2016   Procedure: Peripheral Vascular Atherectomy;  Surgeon: Waynetta Sandy, MD;  Location: [facility] [place] [facility];  Service: Cardiovascular;  Laterality: Right;  Anterior Tibial and Popliteal   ROTATOR CUFF REPAIR     Left   US ECHOCARDIOGRAPHY     Current Outpatient Medications  Medication Sig Dispense Refill   Adalimumab 40 MG/0.8ML PNKT Inject 40 mg into the skin every 14 (fourteen) days.     amoxicillin (AMOXIL) 500 MG capsule TAKE 4 CAPSULES ONE HOUR BEFORE DENTAL APPOINTMENT. 8 capsule 0   aspirin 81 MG tablet Take 1 tablet (81 mg total) by mouth daily.     atorvastatin (LIPITOR) 80 MG tablet TAKE ONE TABLET BY  MOUTH AT BEDTIME 90 tablet 0   cilostazol (PLETAL) 100 MG tablet TAKE (1) TABLET TWICE A DAY BEFORE MEALS. 60 tablet 11   citalopram (CELEXA) 40 MG tablet Take 40 mg by mouth daily at 12 noon.     Cyanocobalamin (VITAMIN B 12) 100 MCG LOZG Take 1 tablet by mouth daily. (Patient not taking: Reported on 08/25/2022)     dapagliflozin propanediol (FARXIGA) 5 MG TABS tablet Take 5 mg by mouth daily.     enalapril (VASOTEC) 2.5 MG tablet Take by mouth.     fluticasone (FLONASE) 50 MCG/ACT nasal spray Place into both nostrils.     furosemide (LASIX) 20 MG tablet TAKE ONE TABLET BY MOUTH DAILY 90 tablet 3   Galcanezumab-gnlm (EMGALITY) 120 MG/ML SOAJ Inject in to skin every  30 days. (Patient not taking: Reported on 08/25/2022) 1.12 mL 11   lansoprazole (PREVACID) 15 MG capsule Take 15 mg by mouth daily as needed. For acid reflux     metoprolol tartrate (LOPRESSOR) 50 MG tablet TAKE (1/2) TABLET TWICE DAILY. 90 tablet 3   Riboflavin (VITAMIN B-2 PO) Take 400 mg by mouth daily. (Patient not taking: Reported on 08/25/2022)     Rimegepant Sulfate (NURTEC) 75 MG TBDP Take 75 mg by mouth daily as needed. For migraines. Take as close to onset of migraine as possible. One daily maximum. (Patient not taking: Reported on 08/25/2022) 4 tablet 0   sodium chloride (OCEAN) 0.65 % SOLN nasal spray Place 1 spray into both nostrils as needed for congestion.     tamsulosin (FLOMAX) 0.4 MG CAPS capsule Take 0.4 mg by mouth daily.      No current facility-administered medications for this visit.    Current Outpatient Medications:    Adalimumab 40 MG/0.8ML PNKT, Inject 40 mg into the skin every 14 (fourteen) days., Disp: , Rfl:    amoxicillin (AMOXIL) 500 MG capsule, TAKE 4 CAPSULES ONE HOUR BEFORE DENTAL APPOINTMENT., Disp: 8 capsule, Rfl: 0   aspirin 81 MG tablet, Take 1 tablet (81 mg total) by mouth daily., Disp: , Rfl:    atorvastatin (LIPITOR) 80 MG tablet, TAKE ONE TABLET BY MOUTH AT BEDTIME, Disp: 90 tablet, Rfl: 0   cilostazol (PLETAL) 100 MG tablet, TAKE (1) TABLET TWICE A DAY BEFORE MEALS., Disp: 60 tablet, Rfl: 11   citalopram (CELEXA) 40 MG tablet, Take 40 mg by mouth daily at 12 noon., Disp: , Rfl:    Cyanocobalamin (VITAMIN B 12) 100 MCG LOZG, Take 1 tablet by mouth daily. (Patient not taking: Reported on 08/25/2022), Disp: , Rfl:    dapagliflozin propanediol (FARXIGA) 5 MG TABS tablet, Take 5 mg by mouth daily., Disp: , Rfl:    enalapril (VASOTEC) 2.5 MG tablet, Take by mouth., Disp: , Rfl:    fluticasone (FLONASE) 50 MCG/ACT nasal spray, Place into both nostrils., Disp: , Rfl:    furosemide (LASIX) 20 MG tablet, TAKE ONE TABLET BY MOUTH DAILY, Disp: 90 tablet, Rfl: 3    Galcanezumab-gnlm (EMGALITY) 120 MG/ML SOAJ, Inject in to skin every 30 days. (Patient not taking: Reported on 08/25/2022), Disp: 1.12 mL, Rfl: 11   lansoprazole (PREVACID) 15 MG capsule, Take 15 mg by mouth daily as needed. For acid reflux, Disp: , Rfl:    metoprolol tartrate (LOPRESSOR) 50 MG tablet, TAKE (1/2) TABLET TWICE DAILY., Disp: 90 tablet, Rfl: 3   Riboflavin (VITAMIN B-2 PO), Take 400 mg by mouth daily. (Patient not taking: Reported on 08/25/2022), Disp: , Rfl:    Rimegepant Sulfate (  NURTEC) 75 MG TBDP, Take 75 mg by mouth daily as needed. For migraines. Take as close to onset of migraine as possible. One daily maximum. (Patient not taking: Reported on 08/25/2022), Disp: 4 tablet, Rfl: 0   sodium chloride (OCEAN) 0.65 % SOLN nasal spray, Place 1 spray into both nostrils as needed for congestion., Disp: , Rfl:    tamsulosin (FLOMAX) 0.4 MG CAPS capsule, Take 0.4 mg by mouth daily. , Disp: , Rfl:  Allergies  Allergen Reactions   Clopidogrel Itching   Sulfamethoxazole-Trimethoprim Other (See Comments)    Dizziness, flushed   Codeine Other (See Comments)    Blood pressure drops   Flexeril [Cyclobenzaprine]     sedation   Morphine And Related Itching   Family History  Problem Relation Age of Onset   Hypertension Father    Diabetes Father    Lung cancer Father    Breast cancer Sister    Healthy Brother    Heart disease Maternal Grandmother    Healthy Maternal Grandfather    Cancer Maternal Grandfather    Migraines Neg Hx    Headache Neg Hx    Colon cancer Neg Hx    Pancreatic cancer Neg Hx    Stomach cancer Neg Hx    Esophageal cancer Neg Hx    Sleep apnea Neg Hx    Colon polyps Neg Hx    Rectal cancer Neg Hx    Social History   Socioeconomic History   Marital status: Divorced    Spouse name: Not on file   Number of children: 4   Years of education: Not on file   Highest education level: Bachelor's degree (e.g., BA, AB, BS)  Occupational History   Occupation: Optician, dispensing: Research scientist (life sciences)   Tobacco Use   Smoking status: Never    Passive exposure: Never   Smokeless tobacco: Never  Vaping Use   Vaping Use: Never used  Substance and Sexual Activity   Alcohol use: No   Drug use: No   Sexual activity: Not on file  Other Topics Concern   Not on file  Social History Narrative   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   Left handed   Caffeine: about 1 cups daily   Social Determinants of Health   Financial Resource Strain: Not on file  Food Insecurity: Not on file  Transportation Needs: No Transportation Needs (07/06/2022)   PRAPARE - Hydrologist (Medical): No    Lack of Transportation (Non-Medical): No  Physical Activity: Not on file  Stress: Not on file  Social Connections: Not on file  Intimate Partner Violence: Not on file    Physical Exam: There were no vitals filed for this visit. There is no height or weight on file to calculate BMI. GEN: NAD EYE: Sclerae anicteric ENT: MMM CV: Non-tachycardic GI: Soft, NT/ND NEURO:  Alert & Oriented x 3  Lab Results: No results for input(s): "WBC", "HGB", "HCT", "PLT" in the last 72 hours. BMET No results for input(s): "NA", "K", "CL", "CO2", "GLUCOSE", "BUN", "CREATININE", "CALCIUM" in the last 72 hours. LFT No results for input(s): "PROT", "ALBUMIN", "AST", "ALT", "ALKPHOS", "BILITOT", "BILIDIR", "IBILI" in the last 72 hours. PT/INR No results for input(s): "LABPROT", "INR" in the last 72 hours.   Impression / Plan: This is a 72 y.o.male who presents for Colonoscopy for surveillance.   The risks and benefits of endoscopic evaluation/treatment were discussed with the patient and/or family; these  include but are not limited to the risk of perforation, infection, bleeding, missed lesions, lack of diagnosis, severe illness requiring hospitalization, as well as anesthesia and sedation related illnesses.  The patient's history has been reviewed, patient examined,  no change in status, and deemed stable for procedure.  The patient and/or family is agreeable to proceed.    Justice Britain, MD [facility] Office # [phone]

## 2022-09-15 NOTE — Progress Notes (Unsigned)
Patient stable to recovery, report given to RN

## 2022-09-15 NOTE — Patient Instructions (Addendum)
Recommendation:           - The patient will be observed post-procedure,                            until all discharge criteria are met.                           - Discharge patient to home.                           - Patient has a contact number available for                            emergencies. The signs and symptoms of potential                            delayed complications were discussed with the                            patient. Return to normal activities tomorrow.                            Written discharge instructions were provided to the                            patient.                           - High fiber diet.                           - Use FiberCon 1-2 tablets PO daily.                           - Continue present medications.                           - Await pathology results.                           - Repeat colonoscopy in 1 year for surveillance due                            to number of polyps remove today.                           - The findings and recommendations were discussed                            with the patient.                           - The findings and recommendations were discussed                            with the designated responsible adult.  Handouts on  high fiber diet, hemorrhoids and polyps given.  YOU HAD AN ENDOSCOPIC PROCEDURE TODAY AT [facility]:   Refer to the procedure report that was given to you for any specific questions about what was found during the examination.  If the procedure report does not answer your questions, please call your gastroenterologist to clarify.  If you requested that your care partner not be given the details of your procedure findings, then the procedure report has been included in a sealed envelope for you to review at your convenience later.  YOU SHOULD EXPECT: Some feelings of bloating in the abdomen. Passage of more gas than usual.  Walking can help get rid of the air that  was put into your GI tract during the procedure and reduce the bloating. If you had a lower endoscopy (such as a colonoscopy or flexible sigmoidoscopy) you may notice spotting of blood in your stool or on the toilet paper. If you underwent a bowel prep for your procedure, you may not have a normal bowel movement for a few days.  Please Note:  You might notice some irritation and congestion in your nose or some drainage.  This is from the oxygen used during your procedure.  There is no need for concern and it should clear up in a day or so.  SYMPTOMS TO REPORT IMMEDIATELY:  Following lower endoscopy (colonoscopy or flexible sigmoidoscopy):  Excessive amounts of blood in the stool  Significant tenderness or worsening of abdominal pains  Swelling of the abdomen that is new, acute  Fever of 100F or higher  For urgent or emergent issues, a gastroenterologist can be reached at any hour by calling [phone]. Do not use MyChart messaging for urgent concerns.   DIET:  We do recommend a small meal at first, but then you may proceed to your regular diet.  Drink plenty of fluids but you should avoid alcoholic beverages for 24 hours.  ACTIVITY:  You should plan to take it easy for the rest of today and you should NOT DRIVE or use heavy machinery until tomorrow (because of the sedation medicines used during the test).    FOLLOW UP: Our staff will call the number listed on your records the next business day following your procedure.  We will call around 7:15- 8:00 am to check on you and address any questions or concerns that you may have regarding the information given to you following your procedure. If we do not reach you, we will leave a message.     If any biopsies were taken you will be contacted by phone or by letter within the next 1-3 weeks.  Please call us at [phone] if you have not heard about the biopsies in 3 weeks.    SIGNATURES/CONFIDENTIALITY: You and/or your care partner have  signed paperwork which will be entered into your electronic medical record.  These signatures attest to the fact that that the information above on your After Visit Summary has been reviewed and is understood.  Full responsibility of the confidentiality of this discharge information lies with you and/or your care-partner.

## 2022-09-15 NOTE — Progress Notes (Signed)
Called to room to assist during endoscopic procedure.  Patient ID and intended procedure confirmed with present staff. Received instructions for my participation in the procedure from the performing physician.  

## 2022-09-15 NOTE — Progress Notes (Signed)
Pt's states no medical or surgical changes since previsit or office visit. VS assessed by [place] ?

## 2022-09-15 NOTE — Op Note (Signed)
[place] Patient Name: [name] Procedure Date: 09/15/2022 11:01 AM MRN: [id number] Endoscopist: Justice Britain , MD, [id number] Age: 72 Referring MD:  Date of Birth: 1950/09/26 Gender: Male Account #: [id number] Procedure:                Colonoscopy Indications:              Surveillance: Personal history of adenomatous                            polyps on last colonoscopy 3 years ago Medicines:                Monitored Anesthesia Care Procedure:                Pre-Anesthesia Assessment:                           - Prior to the procedure, a History and Physical                            was performed, and patient medications and                            allergies were reviewed. The patient's tolerance of                            previous anesthesia was also reviewed. The risks                            and benefits of the procedure and the sedation                            options and risks were discussed with the patient.                            All questions were answered, and informed consent                            was obtained. Prior Anticoagulants: The patient has                            taken no anticoagulant or antiplatelet agents. ASA                            Grade Assessment: III - A patient with severe                            systemic disease. After reviewing the risks and                            benefits, the patient was deemed in satisfactory                            condition to undergo the procedure.  After obtaining informed consent, the colonoscope                            was passed under direct vision. Throughout the                            procedure, the patient's blood pressure, pulse, and                            oxygen saturations were monitored continuously. The                            CF [id number] was introduced through the anus                            and advanced  to the 3 cm into the ileum. The                            colonoscopy was performed without difficulty. The                            patient tolerated the procedure. The quality of the                            bowel preparation was adequate. The terminal ileum,                            ileocecal valve, appendiceal orifice, and rectum                            were photographed. Scope In: 11:09:35 AM Scope Out: 11:29:03 AM Scope Withdrawal Time: 0 hours 16 minutes 38 seconds  Total Procedure Duration: 0 hours 19 minutes 28 seconds  Findings:                 The digital rectal exam findings include                            hemorrhoids. Pertinent negatives include no                            palpable rectal lesions.                           The terminal ileum and ileocecal valve appeared                            normal.                           11, sessile polyps were found in the sigmoid colon                            (1), descending colon (3), transverse colon (1),  hepatic flexure (1), and ascending colon (5). The                            polyps were 2 to 12 mm in size. These polyps were                            removed with a cold snare. Resection and retrieval                            were complete.                           Normal mucosa was found in the entire colon                            otherwise.                           Non-bleeding non-thrombosed external and internal                            hemorrhoids were found during retroflexion, during                            perianal exam and during digital exam. The                            hemorrhoids were Grade II (internal hemorrhoids                            that prolapse but reduce spontaneously). Complications:            No immediate complications. Estimated Blood Loss:     Estimated blood loss was minimal. Impression:               - Hemorrhoids found on digital  rectal exam.                           - The examined portion of the ileum was normal.                           - 11, 2 to 12 mm polyps in the sigmoid colon, in                            the descending colon, in the transverse colon, at                            the hepatic flexure and in the ascending colon,                            removed with a cold snare. Resected and retrieved.                           - Normal mucosa in the entire examined colon  otherwise.                           - Non-bleeding non-thrombosed external and internal                            hemorrhoids. Recommendation:           - The patient will be observed post-procedure,                            until all discharge criteria are met.                           - Discharge patient to home.                           - Patient has a contact number available for                            emergencies. The signs and symptoms of potential                            delayed complications were discussed with the                            patient. Return to normal activities tomorrow.                            Written discharge instructions were provided to the                            patient.                           - High fiber diet.                           - Use FiberCon 1-2 tablets PO daily.                           - Continue present medications.                           - Await pathology results.                           - Repeat colonoscopy in 1 year for surveillance due                            to number of polyps remove today.                           - The findings and recommendations were discussed                            with the patient.                           -  The findings and recommendations were discussed                            with the designated responsible adult. Justice Britain, MD 09/15/2022 11:34:28 AM

## 2022-09-18 ENCOUNTER — Telehealth: Payer: Self-pay | Admitting: *Deleted

## 2022-09-18 NOTE — Telephone Encounter (Signed)
No answer on  follow up call. Left message.   

## 2022-09-25 ENCOUNTER — Encounter: Payer: Self-pay | Admitting: Gastroenterology

## 2022-10-09 ENCOUNTER — Other Ambulatory Visit: Payer: Self-pay | Admitting: Cardiovascular Disease

## 2022-10-16 ENCOUNTER — Ambulatory Visit: Payer: BC Managed Care – PPO | Admitting: Cardiovascular Disease

## 2022-10-16 NOTE — Progress Notes (Deleted)
Cardiology Office Note:    Date:  10/16/2022   ID:  [name], DOB 1951/02/04, MRN ID:[id number]  PCP:  Caren Macadam, [place] Prov[facility] Cardiologist:  Sherren Mocha, MD { Click to update primary MD,subspecialty MD or APP then REFRESH:1}    Referring MD: Caren Macadam, MD   No chief complaint on file. ***  History of Present Illness:    [name] is a 72 y.o. male with a hx of ***  Past Medical History:  Diagnosis Date   Aortic stenosis    a. severe bicuspid AV stenosis s/p pericardial tissue valve 2012.   Arthritis    Coronary artery disease    a. s/p CABG (LIMA-LCx) at time of AVR 2012, mild nonobstructive LAD and RCA stenoses. // Myoview 03/2022: EF 11, no ischemia; low risk   Depression    Depression    Diabetes mellitus without complication ([place])    ED (erectile dysfunction)    Encounter for long-term (current) use of high-risk medication 06/30/2015   Fatty liver    Femoral-popliteal bypass graft occlusion, right [facility]) 1997   Dr. Kellie Simmering   GERD (gastroesophageal reflux disease)    Gout    H/O migraine    up to his 74s   Headache(784.0)    Heart murmur    Hematuria    negative workup   History of colonic polyps    Hyperlipemia, mixed    Hyperlipidemia    Hypertension    Irritable bowel syndrome 02/19/2020   Migraines    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   Obesity    Obstructive sleep apnea 01/10/2021   PAD (peripheral artery disease) (HCC)    PONV (postoperative nausea and vomiting)    PVD (peripheral vascular disease) (HCC)    Thoracic aortic aneurysm ([place]) 05/09/2022   Echocardiogram 10/23: EF 55-60, no RWMA, mild LVH, normal RVSF, mild MR, s/p AVR with mild AI and normal gradient (15 mmHg), mod dilation of aortic root and ascending aorta (45 mm).  Chest CT 10/23: Ascending thoracic aorta 4.1 cm; aortic atherosclerosis    Past Surgical History:  Procedure Laterality Date   AORTIC  VALVE REPLACEMENT  07/18/2011   Procedure: AORTIC VALVE REPLACEMENT (AVR);  Surgeon: Grace Isaac, MD;  Location: [facility];  Service: Open Heart Surgery;  Laterality: N/A;   CARDIAC CATHETERIZATION  2009, 07/11/11   Dr. Irish Lack   CORONARY ARTERY BYPASS GRAFT  07/18/2011   Procedure: CORONARY ARTERY BYPASS GRAFTING (CABG);  Surgeon: Grace Isaac, MD;  Location: [facility];  Service: Open Heart Surgery;  Laterality: N/A;  times one to mammary artery, left   fem stent Right 2017   FEMORAL ARTERY - POPLITEAL ARTERY BYPASS GRAFT     KNEE ARTHROSCOPY     left   MRI     to visualize aortic valve   PERIPHERAL VASCULAR CATHETERIZATION N/A 07/12/2016   Procedure: Abdominal Aortogram w/Lower Extremity;  Surgeon: Waynetta Sandy, MD;  Location: [facility];  Service: Cardiovascular;  Laterality: N/A;   PERIPHERAL VASCULAR CATHETERIZATION Right 07/12/2016   Procedure: Peripheral Vascular Atherectomy;  Surgeon: Waynetta Sandy, MD;  Location: [facility];  Service: Cardiovascular;  Laterality: Right;  Anterior Tibial and Popliteal   ROTATOR CUFF REPAIR     Left   US ECHOCARDIOGRAPHY      Current Medications: No outpatient medications have been marked as taking for the 10/16/22 encounter (  Appointment) with Sherren Mocha, MD.     Allergies:   Clopidogrel, Sulfamethoxazole-trimethoprim, Codeine, Flexeril [cyclobenzaprine], and Morphine and related   Social History   Socioeconomic History   Marital status: Divorced    Spouse name: Not on file   Number of children: 4   Years of education: Not on file   Highest education level: Bachelor's degree (e.g., BA, AB, BS)  Occupational History   Occupation: Horticulturist, commercial: Research scientist (life sciences)   Tobacco Use   Smoking status: Never    Passive exposure: Never   Smokeless tobacco: Never  Vaping Use   Vaping Use: Never used  Substance and Sexual Activity   Alcohol use: No   Drug use: No   Sexual activity: Not on  file  Other Topics Concern   Not on file  Social History Narrative   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   Left handed   Caffeine: about 1 cups daily   Social Determinants of Health   Financial Resource Strain: Not on file  Food Insecurity: Not on file  Transportation Needs: No Transportation Needs (07/06/2022)   PRAPARE - Hydrologist (Medical): No    Lack of Transportation (Non-Medical): No  Physical Activity: Not on file  Stress: Not on file  Social Connections: Not on file     Family History: The patient's ***family history includes Breast cancer in his sister; Cancer in his maternal grandfather; Diabetes in his father; Healthy in his brother and maternal grandfather; Heart disease in his maternal grandmother; Hypertension in his father; Lung cancer in his father. There is no history of Migraines, Headache, Colon cancer, Pancreatic cancer, Stomach cancer, Esophageal cancer, Sleep apnea, Colon polyps, or Rectal cancer.  ROS:   Please see the history of present illness.    *** All other systems reviewed and are negative.  EKGs/Labs/Other Studies Reviewed:    The following studies were reviewed today: Cardiac Studies & Procedures     STRESS TESTS  MYOCARDIAL PERFUSION IMAGING 04/26/2022  Narrative   The study is normal. The study is low risk.   Additional 1.0 mm of down sloping ST depression in the inferior and lateral leads (I, II, III, aVL, aVF, V5 and V6) was noted.   LV perfusion is normal.   Left ventricular function is abnormal. Nuclear stress EF: 48 %. The left ventricular ejection fraction is moderately decreased (30-44%). End diastolic cavity size is normal.   Prior study available for comparison from 01/14/2014. No changes compared to prior study.  Low risk stress nuclear study with normal perfusion. Mildly reduced left ventricular global systolic function may be artifactual due to poor gating (note frequent PACs).  Recommend  correlation with echocardiogram. Nodiagnostic ECG response to exercise due to baseline ST depression.   ECHOCARDIOGRAM  ECHOCARDIOGRAM COMPLETE 05/09/2022  Narrative ECHOCARDIOGRAM REPORT    Patient Name:   [name] Date of Exam: 05/05/2022 Medical Rec #:  [id number]        Height:       72.0 in Accession #:    [id number]       Weight:       221.0 lb Date of Birth:  Dec 30, 1950        BSA:          2.223 m Patient Age:    73 years         BP:           122/82 mmHg Patient Gender:  M                HR:           77 bpm. Exam Location:  [address]  Procedure: 2D Echo, Cardiac Doppler, Color Doppler and Intracardiac Opacification Agent  Indications:    Aortic Stenosis I35.0  History:        Patient has prior history of Echocardiogram examinations, most recent 03/28/2021. Prior CABG; Risk Factors:Hypertension, Diabetes and Dyslipidemia. Aortic Valve: 23 mm Edwards bioprosthetic valve is present in the aortic position.  Sonographer:    Mikki Santee RDCS Referring Phys: [id number] WEAVER  IMPRESSIONS   1. S/P AVR with mild AI and normal mean gradient of 15 mmHg). 2. Left ventricular ejection fraction, by estimation, is 55 to 60%. The left ventricle has normal function. The left ventricle has no regional wall motion abnormalities. There is mild concentric left ventricular hypertrophy. Left ventricular diastolic parameters are indeterminate. 3. Right ventricular systolic function is normal. The right ventricular size is normal. Tricuspid regurgitation signal is inadequate for assessing PA pressure. 4. The mitral valve is normal in structure. Mild mitral valve regurgitation. No evidence of mitral stenosis. 5. The aortic valve has been repaired/replaced. Aortic valve regurgitation is mild. No aortic stenosis is present. There is a 23 mm Edwards bioprosthetic valve present in the aortic position. 6. Aortic dilatation noted. There is moderate dilatation of the aortic root  and of the ascending aorta, measuring 45 mm. 7. The inferior vena cava is normal in size with greater than 50% respiratory variability, suggesting right atrial pressure of 3 mmHg.  FINDINGS Left Ventricle: Left ventricular ejection fraction, by estimation, is 55 to 60%. The left ventricle has normal function. The left ventricle has no regional wall motion abnormalities. Definity contrast agent was given IV to delineate the left ventricular endocardial borders. The left ventricular internal cavity size was normal in size. There is mild concentric left ventricular hypertrophy. Abnormal (paradoxical) septal motion consistent with post-operative status. Left ventricular diastolic parameters are indeterminate.  Right Ventricle: The right ventricular size is normal. Right ventricular systolic function is normal. Tricuspid regurgitation signal is inadequate for assessing PA pressure. The tricuspid regurgitant velocity is 2.01 m/s, and with an assumed right atrial pressure of 3 mmHg, the estimated right ventricular systolic pressure is 99.2 mmHg.  Left Atrium: Left atrial size was normal in size.  Right Atrium: Right atrial size was normal in size.  Pericardium: There is no evidence of pericardial effusion.  Mitral Valve: The mitral valve is normal in structure. Mild mitral annular calcification. Mild mitral valve regurgitation. No evidence of mitral valve stenosis.  Tricuspid Valve: The tricuspid valve is normal in structure. Tricuspid valve regurgitation is trivial. No evidence of tricuspid stenosis.  Aortic Valve: The aortic valve has been repaired/replaced. Aortic valve regurgitation is mild. No aortic stenosis is present. Aortic valve mean gradient measures 14.6 mmHg. Aortic valve peak gradient measures 26.1 mmHg. Aortic valve area, by VTI measures 1.13 cm. There is a 23 mm Edwards bioprosthetic valve present in the aortic position.  Pulmonic Valve: The pulmonic valve was normal in structure.  Pulmonic valve regurgitation is trivial. No evidence of pulmonic stenosis.  Aorta: Aortic dilatation noted. There is moderate dilatation of the aortic root and of the ascending aorta, measuring 45 mm.  Venous: The inferior vena cava is normal in size with greater than 50% respiratory variability, suggesting right atrial pressure of 3 mmHg.  IAS/Shunts: No atrial level shunt detected by color flow  Doppler.  Additional Comments: S/P AVR with mild AI and normal mean gradient of 15 mmHg).   LEFT VENTRICLE PLAX 2D LVIDd:         4.50 cm LVIDs:         2.90 cm LV PW:         1.10 cm LV IVS:        1.20 cm LVOT diam:     2.10 cm LV SV:         62 LV SV Index:   28 LVOT Area:     3.46 cm   RIGHT VENTRICLE RV Basal diam:  3.00 cm RV Mid diam:    2.40 cm RV S prime:     7.83 cm/s TAPSE (M-mode): 1.2 cm  LEFT ATRIUM             Index        RIGHT ATRIUM           Index LA diam:        4.20 cm 1.89 cm/m   RA Area:     13.40 cm LA Vol (A2C):   73.1 ml 32.88 ml/m  RA Volume:   28.50 ml  12.82 ml/m LA Vol (A4C):   50.9 ml 22.90 ml/m LA Biplane Vol: 64.2 ml 28.88 ml/m AORTIC VALVE AV Area (Vmax):    1.21 cm AV Area (Vmean):   1.17 cm AV Area (VTI):     1.13 cm AV Vmax:           255.40 cm/s AV Vmean:          175.400 cm/s AV VTI:            0.547 m AV Peak Grad:      26.1 mmHg AV Mean Grad:      14.6 mmHg LVOT Vmax:         89.10 cm/s LVOT Vmean:        59.200 cm/s LVOT VTI:          0.178 m LVOT/AV VTI ratio: 0.33  AORTA Ao Root diam: 3.10 cm Ao Asc diam:  4.25 cm  MITRAL VALVE               TRICUSPID VALVE MV Area (PHT): 2.66 cm    TR Peak grad:   16.2 mmHg MV Decel Time: 285 msec    TR Vmax:        201.00 cm/s MV E velocity: 66.30 cm/s MV A velocity: 76.80 cm/s  SHUNTS MV E/A ratio:  0.86        Systemic VTI:  0.18 m Systemic Diam: 2.10 cm  Kirk Ruths MD Electronically signed by Kirk Ruths MD Signature Date/Time: 05/09/2022/3:17:59 PM    Final               EKG:  EKG is *** ordered today.  The ekg ordered today demonstrates ***  Recent Labs: 10/25/2021: BUN 17; Creatinine, Ser 1.10; Potassium 4.2; Sodium 142  Recent Lipid Panel    Component Value Date/Time   CHOL 122 03/21/2018 0755   TRIG 138 03/21/2018 0755   HDL 42 03/21/2018 0755   CHOLHDL 2.9 03/21/2018 0755   CHOLHDL 4.5 05/12/2016 0854   VLDL 28 05/12/2016 0854   LDLCALC 52 03/21/2018 0755     Risk Assessment/Calculations:   {Does this patient have ATRIAL FIBRILLATION?[phone]  No BP recorded.  {Refresh Note OR Click here to enter BP  :1}***  Physical Exam:    VS:  There were no vitals taken for this visit.    Wt Readings from Last 3 Encounters:  09/15/22 204 lb (92.5 kg)  08/25/22 204 lb (92.5 kg)  04/26/22 221 lb (100.2 kg)     GEN: *** Well nourished, well developed in no acute distress HEENT: Normal NECK: No JVD; No carotid bruits LYMPHATICS: No lymphadenopathy CARDIAC: ***RRR, no murmurs, rubs, gallops RESPIRATORY:  Clear to auscultation without rales, wheezing or rhonchi  ABDOMEN: Soft, non-tender, non-distended MUSCULOSKELETAL:  No edema; No deformity  SKIN: Warm and dry NEUROLOGIC:  Alert and oriented x 3 PSYCHIATRIC:  Normal affect   ASSESSMENT:    No diagnosis found. PLAN:    In order of problems listed above:  ***      {Are you ordering a CV Procedure (e.g. stress test, cath, DCCV, TEE, etc)?   Press F2        :[id number]    Medication Adjustments/Labs and Tests Ordered: Current medicines are reviewed at length with the patient today.  Concerns regarding medicines are outlined above.  No orders of the defined types were placed in this encounter.  No orders of the defined types were placed in this encounter.   There are no Patient Instructions on file for this visit.   Signed, Sherren Mocha, MD  10/16/2022 7:56 AM    [place]

## 2022-10-20 ENCOUNTER — Other Ambulatory Visit: Payer: Self-pay | Admitting: Cardiovascular Disease

## 2022-11-23 DIAGNOSIS — L4050 Arthropathic psoriasis, unspecified: Secondary | ICD-10-CM | POA: Diagnosis not present

## 2022-11-23 DIAGNOSIS — D84821 Immunodeficiency due to drugs: Secondary | ICD-10-CM | POA: Diagnosis not present

## 2022-11-23 DIAGNOSIS — M1A00X0 Idiopathic chronic gout, unspecified site, without tophus (tophi): Secondary | ICD-10-CM | POA: Diagnosis not present

## 2022-11-23 DIAGNOSIS — Z79899 Other long term (current) drug therapy: Secondary | ICD-10-CM | POA: Diagnosis not present

## 2022-12-05 ENCOUNTER — Other Ambulatory Visit: Payer: Self-pay | Admitting: *Deleted

## 2022-12-05 DIAGNOSIS — I779 Disorder of arteries and arterioles, unspecified: Secondary | ICD-10-CM

## 2022-12-05 DIAGNOSIS — I739 Peripheral vascular disease, unspecified: Secondary | ICD-10-CM

## 2022-12-13 ENCOUNTER — Ambulatory Visit (HOSPITAL_COMMUNITY)
Admission: RE | Admit: 2022-12-13 | Discharge: 2022-12-13 | Disposition: A | Discharge status: Home / Self Care | Payer: BC Managed Care – PPO | Source: Ambulatory Visit | Attending: Vascular Surgery | Admitting: Vascular Surgery

## 2022-12-13 ENCOUNTER — Ambulatory Visit: Payer: BC Managed Care – PPO | Admitting: Physician Assistant

## 2022-12-13 VITALS — BP 124/87 | HR 91 | Temp 97.8°F | Wt 213.0 lb

## 2022-12-13 DIAGNOSIS — I739 Peripheral vascular disease, unspecified: Secondary | ICD-10-CM

## 2022-12-13 DIAGNOSIS — I779 Disorder of arteries and arterioles, unspecified: Secondary | ICD-10-CM | POA: Insufficient documentation

## 2022-12-13 LAB — VAS US ABI WITH/WO TBI
Left ABI: 0.99
Right ABI: 0.62

## 2022-12-13 NOTE — Progress Notes (Unsigned)
Office Note     CC:  follow up Requesting Provider:  Aliene Beams, MD  HPI: [name] is a 72 y.o. (1951/04/09) male who presents for routine follow up of PAD.   Past Medical History:  Diagnosis Date   Aortic stenosis    a. severe bicuspid AV stenosis s/p pericardial tissue valve 2012.   Arthritis    Coronary artery disease    a. s/p CABG (LIMA-LCx) at time of AVR 2012, mild nonobstructive LAD and RCA stenoses. // Myoview 03/2022: EF 48, no ischemia; low risk   Depression    Depression    Diabetes mellitus without complication (HCC)    ED (erectile dysfunction)    Encounter for long-term (current) use of high-risk medication 06/30/2015   Fatty liver    Femoral-popliteal bypass graft occlusion, right [facility]) 1997   Dr. 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   Obesity    Obstructive sleep apnea 01/10/2021   PAD (peripheral artery disease) (HCC)    PONV (postoperative nausea and vomiting)    PVD (peripheral vascular disease) (HCC)    Thoracic aortic aneurysm (HCC) 05/09/2022   Echocardiogram 10/23: EF 55-60, no RWMA, mild LVH, normal RVSF, mild MR, s/p AVR with mild AI and normal gradient (15 mmHg), mod dilation of aortic root and ascending aorta (45 mm).  Chest CT 10/23: Ascending thoracic aorta 4.1 cm; aortic atherosclerosis    Past Surgical History:  Procedure Laterality Date   AORTIC VALVE REPLACEMENT  07/18/2011   Procedure: AORTIC VALVE REPLACEMENT (AVR);  Surgeon: Delight Ovens, MD;  Location: [facility] OR;  Service: Open Heart Surgery;  Laterality: N/A;   CARDIAC CATHETERIZATION  2009, 07/11/11   Dr. Eldridge Dace   CORONARY ARTERY  BYPASS GRAFT  07/18/2011   Procedure: CORONARY ARTERY BYPASS GRAFTING (CABG);  Surgeon: Delight Ovens, MD;  Location: [facility] OR;  Service: Open Heart Surgery;  Laterality: N/A;  times one to mammary artery, left   fem stent Right 2017   FEMORAL ARTERY - POPLITEAL ARTERY BYPASS GRAFT     KNEE ARTHROSCOPY     left   MRI     to visualize aortic valve   PERIPHERAL VASCULAR CATHETERIZATION N/A 07/12/2016   Procedure: Abdominal Aortogram w/Lower Extremity;  Surgeon: Maeola Harman, MD;  Location: [facility] INVASIVE CV LAB;  Service: Cardiovascular;  Laterality: N/A;   PERIPHERAL VASCULAR CATHETERIZATION Right 07/12/2016   Procedure: Peripheral Vascular Atherectomy;  Surgeon: Maeola Harman, MD;  Location: [facility] INVASIVE CV LAB;  Service: Cardiovascular;  Laterality: Right;  Anterior Tibial and Popliteal   ROTATOR CUFF REPAIR     Left   US ECHOCARDIOGRAPHY      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   Tobacco Use  Smoking status: Never    Passive exposure: Never   Smokeless tobacco: Never  Vaping Use   Vaping Use: Never used  Substance and Sexual Activity   Alcohol use: No   Drug use: No   Sexual activity: Not on file  Other Topics Concern   Not on file  Social History Narrative   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   Left handed   Caffeine: about 1 cups daily   Social Determinants of Health   Financial Resource Strain: Not on file  Food Insecurity: Not on file  Transportation Needs: No Transportation Needs (07/06/2022)   PRAPARE - Administrator, Civil Service (Medical): No    Lack of Transportation (Non-Medical): No  Physical Activity: Not on file  Stress: Not on file  Social Connections: Not on  file  Intimate Partner Violence: Not on file   *** Family History  Problem Relation Age of Onset   Hypertension Father    Diabetes Father    Lung cancer Father    Breast cancer Sister    Healthy Brother    Heart disease Maternal Grandmother    Healthy Maternal Grandfather    Cancer Maternal Grandfather    Migraines Neg Hx    Headache Neg Hx    Colon cancer Neg Hx    Pancreatic cancer Neg Hx    Stomach cancer Neg Hx    Esophageal cancer Neg Hx    Sleep apnea Neg Hx    Colon polyps Neg Hx    Rectal cancer Neg Hx     Current Outpatient Medications  Medication Sig Dispense Refill   Adalimumab 40 MG/0.8ML PNKT Inject 40 mg into the skin every 14 (fourteen) days.     amoxicillin (AMOXIL) 500 MG capsule TAKE 4 CAPSULES ONE HOUR BEFORE DENTAL APPOINTMENT. 8 capsule 0   aspirin 81 MG tablet Take 1 tablet (81 mg total) by mouth daily.     atorvastatin (LIPITOR) 80 MG tablet TAKE ONE TABLET BY MOUTH AT BEDTIME 90 tablet 0   dapagliflozin propanediol (FARXIGA) 5 MG TABS tablet Take 5 mg by mouth daily.     enalapril (VASOTEC) 2.5 MG tablet Take by mouth.     fluticasone (FLONASE) 50 MCG/ACT nasal spray Place into both nostrils.     furosemide (LASIX) 20 MG tablet TAKE ONE TABLET BY MOUTH DAILY 90 tablet 1   lansoprazole (PREVACID) 15 MG capsule Take 15 mg by mouth daily as needed. For acid reflux     metoprolol tartrate (LOPRESSOR) 50 MG tablet TAKE (1/2) TABLET TWICE DAILY. 90 tablet 3   sodium chloride (OCEAN) 0.65 % SOLN nasal spray Place 1 spray into both nostrils as needed for congestion.     tamsulosin (FLOMAX) 0.4 MG CAPS capsule Take 0.4 mg by mouth daily.      No current facility-administered medications for this visit.    Allergies  Allergen Reactions   Clopidogrel Itching   Sulfamethoxazole-Trimethoprim Other (See Comments)    Dizziness, flushed   Codeine Other (See Comments)    Blood pressure drops   Flexeril [Cyclobenzaprine]     sedation   Morphine And Codeine  Itching     REVIEW OF SYSTEMS:  *** [X]  denotes positive finding, [ ]  denotes negative finding Cardiac  Comments:  Chest pain or chest pressure:    Shortness of breath upon exertion:    Short of breath when lying flat:    Irregular heart rhythm:  Vascular    Pain in calf, thigh, or hip brought on by ambulation:    Pain in feet at night that wakes you up from your sleep:     Blood clot in your veins:    Leg swelling:         Pulmonary    Oxygen at home:    Productive cough:     Wheezing:         Neurologic    Sudden weakness in arms or legs:     Sudden numbness in arms or legs:     Sudden onset of difficulty speaking or slurred speech:    Temporary loss of vision in one eye:     Problems with dizziness:         Gastrointestinal    Blood in stool:     Vomited blood:         Genitourinary    Burning when urinating:     Blood in urine:        Psychiatric    Major depression:         Hematologic    Bleeding problems:    Problems with blood clotting too easily:        Skin    Rashes or ulcers:        Constitutional    Fever or chills:      PHYSICAL EXAMINATION:  Vitals:   12/13/22 1452  BP: 124/87  Pulse: 91  Temp: 97.8 F (36.6 C)  TempSrc: Temporal  SpO2: 96%  Weight: 213 lb (96.6 kg)    General:  WDWN in NAD; vital signs documented above Gait: Not observed HENT: WNL, normocephalic Pulmonary: normal non-labored breathing , without Rales, rhonchi,  wheezing Cardiac: {Desc; regular/irreg:14544} HR Abdomen: soft, NT, no masses Skin: {With/Without:20273} rashes Vascular Exam/Pulses: *** Extremities: {With/Without:20273} ischemic changes, {With/Without:20273} Gangrene , {With/Without:20273} cellulitis; {With/Without:20273} open wounds;  Musculoskeletal: no muscle wasting or atrophy  Neurologic: A&O X 3*** Psychiatric:  The pt has {Desc; normal/abnormal:11317::"Normal"} affect.   Non-Invasive Vascular Imaging:    +-------+-----------+-----------+------------+------------+  ABI/TBIToday's ABIToday's TBIPrevious ABIPrevious TBI  +-------+-----------+-----------+------------+------------+  Right 0.62       0.47       0.73        0.46          +-------+-----------+-----------+------------+------------+  Left  0.99       0.52       0.95        0.65          +-------+-----------+-----------+------------+------------+    ASSESSMENT/PLAN:: 72 y.o. male here for follow up for ***   -***   Graceann Congress, PA-C [facility] [phone]  Clinic MD:   [facility]/ Randie Heinz

## 2022-12-14 ENCOUNTER — Encounter: Payer: Self-pay | Admitting: Physician Assistant

## 2022-12-18 ENCOUNTER — Encounter: Payer: Self-pay | Admitting: *Deleted

## 2022-12-19 ENCOUNTER — Ambulatory Visit: Payer: BC Managed Care – PPO | Admitting: Adult Health

## 2022-12-19 ENCOUNTER — Encounter: Payer: Self-pay | Admitting: Adult Health

## 2022-12-19 VITALS — BP 115/70 | HR 101 | Ht 71.0 in | Wt 210.2 lb

## 2022-12-19 DIAGNOSIS — G4733 Obstructive sleep apnea (adult) (pediatric): Secondary | ICD-10-CM

## 2022-12-19 NOTE — Patient Instructions (Signed)
Continue using CPAP nightly and greater than 4 hours each night °If your symptoms worsen or you develop new symptoms please let us know.  ° °

## 2022-12-19 NOTE — Progress Notes (Signed)
PATIENT: [name] DOB: 1951-06-19  REASON FOR VISIT: follow up HISTORY FROM: patient PRIMARY NEUROLOGIST: Dr. Vickey Huger  Chief Complaint  Patient presents with   Follow-up    Cpap follow up, Rm 19.  Alone.  No concerns.       HISTORY OF PRESENT ILLNESS: Today 12/19/22:  [name] is a 72 y.o. male with a history of OSA on CPAP. Returns today for follow-up.  Reports that CPAP is working well. Denies any new issues. DL is below.        REVIEW OF SYSTEMS: Out of a complete 14 system review of symptoms, the patient complains only of the following symptoms, and all other reviewed systems are negative.  ESS 5  ALLERGIES: Allergies  Allergen Reactions   Clopidogrel Itching   Sulfamethoxazole-Trimethoprim Other (See Comments)    Dizziness, flushed   Codeine Other (See Comments)    Blood pressure drops   Flexeril [Cyclobenzaprine]     sedation   Morphine And Codeine Itching    HOME MEDICATIONS:    PAST MEDICAL HISTORY: Past Medical History:  Diagnosis Date   Aortic stenosis    a. severe bicuspid AV stenosis s/p pericardial tissue valve 2012.   Arthritis    Coronary artery disease    a. s/p CABG (LIMA-LCx) at time of AVR 2012, mild nonobstructive LAD and RCA stenoses. // Myoview 03/2022: EF 48, no ischemia; low risk   Depression    Depression    Diabetes mellitus without complication (HCC)    ED (erectile dysfunction)    Encounter for long-term (current) use of high-risk medication 06/30/2015   Fatty liver    Femoral-popliteal bypass graft occlusion, right [facility]) 1997   Dr. 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    Obesity    Obstructive sleep apnea 01/10/2021   PAD (peripheral artery disease) (HCC)    PONV (postoperative nausea and vomiting)    PVD (peripheral vascular disease) (HCC)    Thoracic aortic aneurysm (HCC) 05/09/2022   Echocardiogram 10/23: EF 55-60, no RWMA, mild LVH, normal RVSF, mild MR, s/p AVR with mild AI and normal gradient (15 mmHg), mod dilation of aortic root and ascending aorta (45 mm).  Chest CT 10/23: Ascending thoracic aorta 4.1 cm; aortic atherosclerosis    PAST SURGICAL HISTORY: Past Surgical History:  Procedure Laterality Date   AORTIC VALVE REPLACEMENT  07/18/2011   Procedure: AORTIC VALVE REPLACEMENT (AVR);  Surgeon: Delight Ovens, MD;  Location: [facility] OR;  Service: Open Heart Surgery;  Laterality: N/A;   CARDIAC CATHETERIZATION  2009, 07/11/11   Dr. Eldridge Dace   CORONARY ARTERY BYPASS GRAFT  07/18/2011   Procedure: CORONARY ARTERY BYPASS GRAFTING (CABG);  Surgeon: Delight Ovens, MD;  Location: [facility] OR;  Service: Open Heart Surgery;  Laterality: N/A;  times one to mammary artery, left   fem stent Right 2017   FEMORAL ARTERY - POPLITEAL ARTERY BYPASS GRAFT     KNEE ARTHROSCOPY     left   MRI     to visualize  aortic valve   PERIPHERAL VASCULAR CATHETERIZATION N/A 07/12/2016   Procedure: Abdominal Aortogram w/Lower Extremity;  Surgeon: Maeola Harman, MD;  Location: [facility] INVASIVE CV LAB;  Service: Cardiovascular;  Laterality: N/A;   PERIPHERAL VASCULAR CATHETERIZATION Right 07/12/2016   Procedure: Peripheral Vascular Atherectomy;  Surgeon: Maeola Harman, MD;  Location: [facility] INVASIVE CV LAB;  Service: Cardiovascular;  Laterality: Right;  Anterior Tibial and Popliteal   ROTATOR CUFF REPAIR     Left   US ECHOCARDIOGRAPHY      FAMILY HISTORY: Family History  Problem Relation Age of Onset   Hypertension Father    Diabetes Father    Lung cancer Father    Breast cancer Sister    Healthy Brother    Heart disease Maternal Grandmother    Healthy  Maternal Grandfather    Cancer Maternal Grandfather    Migraines Neg Hx    Headache Neg Hx    Colon cancer Neg Hx    Pancreatic cancer Neg Hx    Stomach cancer Neg Hx    Esophageal cancer Neg Hx    Sleep apnea Neg Hx    Colon polyps Neg Hx    Rectal cancer Neg Hx     SOCIAL HISTORY: 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   Tobacco Use   Smoking status: Never    Passive exposure: Never   Smokeless tobacco: Never  Vaping Use   Vaping Use: Never used  Substance and Sexual Activity   Alcohol use: No   Drug use: No   Sexual activity: Not on file  Other Topics Concern   Not on file  Social History Narrative   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   Left handed   Caffeine: about 1 cups daily   Social Determinants of Health   Financial Resource Strain: Not on file  Food Insecurity: Not on file  Transportation Needs: No Transportation Needs (07/06/2022)   PRAPARE - Administrator, Civil Service (Medical): No    Lack of Transportation (Non-Medical): No  Physical Activity: Not on file  Stress: Not on file  Social Connections: Not on file  Intimate Partner Violence: Not on file      PHYSICAL EXAM  Vitals:   12/19/22 0955  BP: 115/70  Pulse: (!) 101  Weight: 210 lb 3.2 oz (95.3 kg)  Height: 5\' 11"  (1.803 m)   Body mass index is 29.32 kg/m.  Generalized: Well developed, in no acute distress  Chest: Lungs clear to auscultation bilaterally  Neurological examination  Mentation: Alert oriented to time, place, history taking. Follows all commands speech and language fluent Cranial nerve II-XII: Facial Symmetry noted  DIAGNOSTIC DATA (LABS, IMAGING, TESTING) - I reviewed patient records, labs, notes,  testing and imaging myself where available.  Lab Results  Component Value Date   WBC 7.3 04/14/2016   HGB 12.6 (L) 07/12/2016   HCT 37.0 (L) 07/12/2016   MCV 83.1 04/14/2016   PLT 290 04/14/2016      Component Value Date/Time   NA 142 10/25/2021 1037   K 4.2 10/25/2021 1037   CL 109 (H) 10/25/2021 1037   CO2 18 (L) 10/25/2021 1037   GLUCOSE 99  10/25/2021 1037   GLUCOSE 112 (H) 07/12/2016 0717   BUN 17 10/25/2021 1037   CREATININE 1.10 10/25/2021 1037   CREATININE 1.20 04/14/2016 1029   CALCIUM 9.2 10/25/2021 1037   PROT 6.6 05/12/2016 0854   ALBUMIN 3.9 05/12/2016 0854   AST 40 (H) 05/12/2016 0854   ALT 38 05/12/2016 0854   ALKPHOS 55 05/12/2016 0854   BILITOT 0.5 05/12/2016 0854   GFRNONAA 67 12/15/2016 0857   GFRAA 78 12/15/2016 0857   Lab Results  Component Value Date   CHOL 122 03/21/2018   HDL 42 03/21/2018   LDLCALC 52 03/21/2018   TRIG 138 03/21/2018   CHOLHDL 2.9 03/21/2018   Lab Results  Component Value Date   HGBA1C 5.8 (H) 07/14/2011   No results found for: "VITAMINB12"     ASSESSMENT AND PLAN 72 y.o. year old male  has a past medical history of Aortic stenosis, Arthritis, Coronary artery disease, Depression, Depression, Diabetes mellitus without complication (HCC), ED (erectile dysfunction), Encounter for long-term (current) use of high-risk medication (06/30/2015), Fatty liver, Femoral-popliteal bypass graft occlusion, right (HCC) (1997), GERD (gastroesophageal reflux disease), Gout, H/O migraine, Headache(784.0), Heart murmur, Hematuria, History of colonic polyps, Hyperlipemia, mixed, Hyperlipidemia, Hypertension, Irritable bowel syndrome (02/19/2020), Migraines, Near syncope, Obesity, Obstructive sleep apnea (01/10/2021), PAD (peripheral artery disease) (HCC), PONV (postoperative nausea and vomiting), PVD (peripheral vascular disease) (HCC), and Thoracic aortic aneurysm (HCC) (05/09/2022). here with:  OSA on CPAP  - CPAP compliance excellent - Good  treatment of AHI  - Encourage patient to use CPAP nightly and > 4 hours each night - F/U in 1 year or sooner if needed     Butch Penny, MSN, NP-C 12/19/2022, 10:44 AM [facility] [address] [place], [place] [address] [phone]

## 2022-12-21 ENCOUNTER — Other Ambulatory Visit: Payer: Self-pay

## 2022-12-21 DIAGNOSIS — I739 Peripheral vascular disease, unspecified: Secondary | ICD-10-CM

## 2023-01-16 ENCOUNTER — Other Ambulatory Visit: Payer: Self-pay | Admitting: Cardiovascular Disease

## 2023-01-30 DIAGNOSIS — I739 Peripheral vascular disease, unspecified: Secondary | ICD-10-CM | POA: Diagnosis not present

## 2023-01-30 DIAGNOSIS — I2510 Atherosclerotic heart disease of native coronary artery without angina pectoris: Secondary | ICD-10-CM | POA: Diagnosis not present

## 2023-01-30 DIAGNOSIS — I10 Essential (primary) hypertension: Secondary | ICD-10-CM | POA: Diagnosis not present

## 2023-01-30 DIAGNOSIS — E1169 Type 2 diabetes mellitus with other specified complication: Secondary | ICD-10-CM | POA: Diagnosis not present

## 2023-01-30 DIAGNOSIS — E782 Mixed hyperlipidemia: Secondary | ICD-10-CM | POA: Diagnosis not present

## 2023-02-05 ENCOUNTER — Telehealth: Payer: Self-pay | Admitting: Physical Medicine and Rehabilitation

## 2023-02-05 ENCOUNTER — Telehealth: Payer: Self-pay | Admitting: Orthopaedic Surgery

## 2023-02-05 DIAGNOSIS — G8929 Other chronic pain: Secondary | ICD-10-CM

## 2023-02-05 NOTE — Telephone Encounter (Signed)
Patient would like gel injections in his knees.

## 2023-02-05 NOTE — Telephone Encounter (Signed)
Patient called. Would like an appointment with Dr. Newton 

## 2023-02-07 NOTE — Telephone Encounter (Signed)
Spoke with patient and he is requesting a left shoulder injection. Patient is having same pain and has had no new falls, accidents or injuries. Scheduled 02/14/23

## 2023-02-07 NOTE — Telephone Encounter (Signed)
VOB submitted for Durolane, bilateral knee. Next gel injection needs to be scheduled after 03/01/2023.

## 2023-02-09 ENCOUNTER — Telehealth: Payer: Self-pay

## 2023-02-09 NOTE — Telephone Encounter (Signed)
Faxed completed PA form to [employer] at [phone] and [phone] for Durolane, bilateral knee. PA Pending

## 2023-02-13 ENCOUNTER — Other Ambulatory Visit: Payer: Self-pay

## 2023-02-13 DIAGNOSIS — M170 Bilateral primary osteoarthritis of knee: Secondary | ICD-10-CM

## 2023-02-14 ENCOUNTER — Other Ambulatory Visit: Payer: Self-pay

## 2023-02-14 ENCOUNTER — Ambulatory Visit: Payer: BC Managed Care – PPO | Admitting: Physical Medicine and Rehabilitation

## 2023-02-14 VITALS — BP 113/61 | HR 56

## 2023-02-14 DIAGNOSIS — M25512 Pain in left shoulder: Secondary | ICD-10-CM | POA: Diagnosis not present

## 2023-02-14 DIAGNOSIS — G8929 Other chronic pain: Secondary | ICD-10-CM | POA: Diagnosis not present

## 2023-02-14 NOTE — Progress Notes (Signed)
Left shoulder injection-taking tylenol to knock edge off.   Functional Pain Scale - descriptive words and definitions  Moderate (4)   Constantly aware of pain, can complete ADLs with modification/sleep marginally affected at times/passive distraction is of no use, but active distraction gives some relief. Moderate range order  Average Pain 5   +Driver, -BT, -Dye Allergies.

## 2023-02-16 ENCOUNTER — Other Ambulatory Visit: Payer: Self-pay | Admitting: Cardiovascular Disease

## 2023-02-19 DIAGNOSIS — H04202 Unspecified epiphora, left lacrimal gland: Secondary | ICD-10-CM | POA: Diagnosis not present

## 2023-02-27 DIAGNOSIS — I10 Essential (primary) hypertension: Secondary | ICD-10-CM | POA: Diagnosis not present

## 2023-02-27 MED ORDER — BUPIVACAINE HCL 0.25 % IJ SOLN
5.0000 mL | INTRAMUSCULAR | Status: AC | PRN
Start: 2023-02-14 — End: 2023-02-14
  Administered 2023-02-14: 5 mL via INTRA_ARTICULAR

## 2023-02-27 MED ORDER — TRIAMCINOLONE ACETONIDE 40 MG/ML IJ SUSP
40.0000 mg | INTRAMUSCULAR | Status: AC | PRN
Start: 2023-02-14 — End: 2023-02-14
  Administered 2023-02-14: 40 mg via INTRA_ARTICULAR

## 2023-02-27 NOTE — Progress Notes (Signed)
   [name] - 72 y.o. male MRN [id number]  Date of birth: 03-23-1951  Office Visit Note: Visit Date: 02/14/2023 PCP: Aliene Beams, MD Referred by: Aliene Beams, MD  Subjective: Chief Complaint  Patient presents with   Left Shoulder - Pain   HPI:  [name] is a 72 y.o. male who comes in todayHPI ROS Otherwise per HPI.  Assessment & Plan: Visit Diagnoses:    ICD-10-CM   1. Chronic left shoulder pain  M25.512 XR C-ARM NO REPORT   G89.29       Plan: No additional findings.   Meds & Orders: No orders of the defined types were placed in this encounter.   Orders Placed This Encounter  Procedures   Large Joint Inj   XR C-ARM NO REPORT    Follow-up: No follow-ups on file.   Procedures: Large Joint Inj: L glenohumeral on 02/14/2023 2:30 PM Indications: pain and diagnostic evaluation Details: 22 G 3.5 in needle, fluoroscopy-guided anteromedial approach  Arthrogram: No  Medications: 40 mg triamcinolone acetonide 40 MG/ML; 5 mL bupivacaine 0.25 % Outcome: tolerated well, no immediate complications  There was excellent flow of contrast producing a partial arthrogram of the glenohumeral joint. The patient did have relief of symptoms during the anesthetic phase of the injection. Procedure, treatment alternatives, risks and benefits explained, specific risks discussed. Consent was given by the patient. Immediately prior to procedure a time out was called to verify the correct patient, procedure, equipment, support staff and site/side marked as required. Patient was prepped and draped in the usual sterile fashion.          Clinical History: No specialty comments available.     Objective:  VS:  HT:    WT:   BMI:     BP:113/61  HR:(!) 56bpm  TEMP: ( )  RESP:  Physical Exam   Imaging: No results found.

## 2023-03-02 ENCOUNTER — Encounter: Payer: Self-pay | Admitting: Orthopaedic Surgery

## 2023-03-02 ENCOUNTER — Ambulatory Visit: Payer: BC Managed Care – PPO | Admitting: Orthopaedic Surgery

## 2023-03-02 DIAGNOSIS — M1712 Unilateral primary osteoarthritis, left knee: Secondary | ICD-10-CM

## 2023-03-02 DIAGNOSIS — M1711 Unilateral primary osteoarthritis, right knee: Secondary | ICD-10-CM | POA: Diagnosis not present

## 2023-03-02 DIAGNOSIS — M170 Bilateral primary osteoarthritis of knee: Secondary | ICD-10-CM | POA: Diagnosis not present

## 2023-03-02 MED ORDER — SODIUM HYALURONATE 60 MG/3ML IX PRSY
60.0000 mg | PREFILLED_SYRINGE | INTRA_ARTICULAR | Status: AC | PRN
Start: 2023-03-02 — End: 2023-03-02
  Administered 2023-03-02: 60 mg via INTRA_ARTICULAR

## 2023-03-02 NOTE — Progress Notes (Signed)
Office Visit Note   Patient: [name]           Date of Birth: 1950-11-13           MRN: [id number] Visit Date: 03/02/2023              Requested by: Aliene Beams, MD [phone] [address] [place],  [place] [address] PCP: Aliene Beams, MD   Assessment & Plan: Visit Diagnoses:  1. Primary osteoarthritis of right knee   2. Primary osteoarthritis of left knee     Plan: Stan under went bilateral Durolane injections today.  He tolerated these well.  Follow-up as needed.  Follow-Up Instructions: No follow-ups on file.   Orders:  No orders of the defined types were placed in this encounter.  No orders of the defined types were placed in this encounter.     Procedures: Large Joint Inj: bilateral knee on 03/02/2023 10:59 AM Indications: pain Details: 22 G needle  Arthrogram: No  Medications (Right): 60 mg Sodium Hyaluronate 60 MG/3ML Medications (Left): 60 mg Sodium Hyaluronate 60 MG/3ML Outcome: tolerated well, no immediate complications Patient was prepped and draped in the usual sterile fashion.       Clinical Data: No additional findings.   Subjective: Chief Complaint  Patient presents with   Right Knee - Follow-up    Durolane   Left Knee - Follow-up    Durolane    HPI  Review of Systems   Objective: Vital Signs: There were no vitals taken for this visit.  Physical Exam  Ortho Exam  Specialty Comments:  No specialty comments available.  Imaging: No results found.   PMFS History: Patient Active Problem List   Diagnosis Date Noted   Thoracic aortic aneurysm (HCC) 05/09/2022   Abnormal MRI, neck 11/16/2021   Rotator cuff arthropathy of left shoulder 08/17/2021   Chronic left shoulder pain 08/17/2021   Chronic frontal sinusitis 07/07/2021   Facial paresthesia 07/07/2021   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 07/07/2021   Cervical stenosis of spinal canal 06/07/2021   Meningeal irritation  06/07/2021   Idiopathic intracranial hypotension 06/07/2021   Cervicalgia 03/07/2021   Chronic daily headache 03/07/2021   Migraine 01/10/2021   Obstructive sleep apnea 01/10/2021   Renal stones 10/04/2020   Type 2 diabetes mellitus with unspecified complications (HCC) 10/04/2020   Benign prostatic hyperplasia without lower urinary tract symptoms 09/30/2020   GERD (gastroesophageal reflux disease) 05/05/2020   Irritable bowel syndrome 02/19/2020   Dependence on other enabling machines and devices 02/19/2020   ED (erectile dysfunction) of organic origin 02/19/2020   Major depression single episode, in partial remission (HCC) 02/19/2020   Obesity 02/19/2020   Chronic right-sided low back pain without sciatica 12/16/2019   Primary osteoarthritis of left knee 09/16/2019   Rotator cuff tear arthropathy of left shoulder 09/16/2019   Primary osteoarthritis of right knee 09/16/2019   Complex tear of medial meniscus of right knee as current injury 03/04/2019   Chronic diastolic heart failure (HCC) 11/25/2018   Bruxism (teeth grinding) 11/25/2018   Cephalalgia 11/25/2018   Cerebrovascular disease 11/25/2018   Morning headache 11/25/2018   Sleep related headaches 09/26/2018   Chronic idiopathic gout without tophus 02/13/2018   Hypertension    History of adenomatous polyp of colon 03/14/2016   Thrombosed hemorrhoids 03/01/2016   Encounter for long-term (current) use of high-risk medication 06/30/2015   Psoriasis 12/25/2014   Major depressive disorder, recurrent, in full remission (HCC) 06/22/2014  Gout 04/21/2014   Chest pain, somewhat atypical 12/28/2013   Dyspnea on exertion 12/27/2011   Dizziness 08/15/2011   Heart valve replaced by other means 07/28/2011   Aortic stenosis, severe 07/18/2011   Coronary artery disease 07/06/2011   Peripheral vascular disease (HCC) 07/06/2011   Hyperlipidemia 07/06/2011   Bicuspid aortic valve 07/06/2011   Past Medical History:  Diagnosis Date    Aortic stenosis    a. severe bicuspid AV stenosis s/p pericardial tissue valve 2012.   Arthritis    Coronary artery disease    a. s/p CABG (LIMA-LCx) at time of AVR 2012, mild nonobstructive LAD and RCA stenoses. // Myoview 03/2022: EF 48, no ischemia; low risk   Depression    Depression    Diabetes mellitus without complication (HCC)    ED (erectile dysfunction)    Encounter for long-term (current) use of high-risk medication 06/30/2015   Fatty liver    Femoral-popliteal bypass graft occlusion, right [facility]) 1997   Dr. 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   Obesity    Obstructive sleep apnea 01/10/2021   PAD (peripheral artery disease) (HCC)    PONV (postoperative nausea and vomiting)    PVD (peripheral vascular disease) (HCC)    Thoracic aortic aneurysm (HCC) 05/09/2022   Echocardiogram 10/23: EF 55-60, no RWMA, mild LVH, normal RVSF, mild MR, s/p AVR with mild AI and normal gradient (15 mmHg), mod dilation of aortic root and ascending aorta (45 mm).  Chest CT 10/23: Ascending thoracic aorta 4.1 cm; aortic atherosclerosis    Family History  Problem Relation Age of Onset   Hypertension Father    Diabetes Father    Lung cancer Father    Breast cancer Sister    Healthy Brother    Heart disease Maternal Grandmother    Healthy Maternal Grandfather    Cancer Maternal Grandfather    Migraines Neg Hx    Headache Neg Hx    Colon cancer Neg Hx    Pancreatic cancer Neg Hx    Stomach cancer Neg Hx    Esophageal cancer Neg Hx    Sleep apnea Neg Hx    Colon polyps Neg Hx    Rectal cancer Neg Hx     Past Surgical History:  Procedure Laterality Date    AORTIC VALVE REPLACEMENT  07/18/2011   Procedure: AORTIC VALVE REPLACEMENT (AVR);  Surgeon: Delight Ovens, MD;  Location: [facility] OR;  Service: Open Heart Surgery;  Laterality: N/A;   CARDIAC CATHETERIZATION  2009, 07/11/11   Dr. Eldridge Dace   CORONARY ARTERY BYPASS GRAFT  07/18/2011   Procedure: CORONARY ARTERY BYPASS GRAFTING (CABG);  Surgeon: Delight Ovens, MD;  Location: [facility] OR;  Service: Open Heart Surgery;  Laterality: N/A;  times one to mammary artery, left   fem stent Right 2017   FEMORAL ARTERY - POPLITEAL ARTERY BYPASS GRAFT     KNEE ARTHROSCOPY     left   MRI     to visualize aortic valve   PERIPHERAL VASCULAR CATHETERIZATION N/A 07/12/2016  Procedure: Abdominal Aortogram w/Lower Extremity;  Surgeon: Maeola Harman, MD;  Location: [facility] INVASIVE CV LAB;  Service: Cardiovascular;  Laterality: N/A;   PERIPHERAL VASCULAR CATHETERIZATION Right 07/12/2016   Procedure: Peripheral Vascular Atherectomy;  Surgeon: Maeola Harman, MD;  Location: [facility] INVASIVE CV LAB;  Service: Cardiovascular;  Laterality: Right;  Anterior Tibial and Popliteal   ROTATOR CUFF REPAIR     Left   US ECHOCARDIOGRAPHY     Social History   Occupational History   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[employer]   Tobacco Use   Smoking status: Never    Passive exposure: Never   Smokeless tobacco: Never  Vaping Use   Vaping status: Never Used  Substance and Sexual Activity   Alcohol use: No   Drug use: No   Sexual activity: Not on file

## 2023-03-21 DIAGNOSIS — G4733 Obstructive sleep apnea (adult) (pediatric): Secondary | ICD-10-CM | POA: Diagnosis not present

## 2023-03-22 DIAGNOSIS — H1131 Conjunctival hemorrhage, right eye: Secondary | ICD-10-CM | POA: Diagnosis not present

## 2023-03-28 DIAGNOSIS — R29810 Facial weakness: Secondary | ICD-10-CM | POA: Diagnosis not present

## 2023-03-28 DIAGNOSIS — R29711 NIHSS score 11: Secondary | ICD-10-CM | POA: Diagnosis not present

## 2023-03-28 DIAGNOSIS — R1310 Dysphagia, unspecified: Secondary | ICD-10-CM | POA: Diagnosis not present

## 2023-03-28 DIAGNOSIS — E785 Hyperlipidemia, unspecified: Secondary | ICD-10-CM | POA: Diagnosis not present

## 2023-03-28 DIAGNOSIS — Z954 Presence of other heart-valve replacement: Secondary | ICD-10-CM | POA: Diagnosis not present

## 2023-03-28 DIAGNOSIS — I10 Essential (primary) hypertension: Secondary | ICD-10-CM | POA: Diagnosis not present

## 2023-03-28 DIAGNOSIS — G4733 Obstructive sleep apnea (adult) (pediatric): Secondary | ICD-10-CM | POA: Diagnosis not present

## 2023-03-28 DIAGNOSIS — I63511 Cerebral infarction due to unspecified occlusion or stenosis of right middle cerebral artery: Secondary | ICD-10-CM | POA: Diagnosis not present

## 2023-03-28 DIAGNOSIS — G8194 Hemiplegia, unspecified affecting left nondominant side: Secondary | ICD-10-CM | POA: Diagnosis not present

## 2023-03-28 DIAGNOSIS — F3342 Major depressive disorder, recurrent, in full remission: Secondary | ICD-10-CM | POA: Diagnosis not present

## 2023-03-28 DIAGNOSIS — I080 Rheumatic disorders of both mitral and aortic valves: Secondary | ICD-10-CM | POA: Diagnosis not present

## 2023-03-28 DIAGNOSIS — R531 Weakness: Secondary | ICD-10-CM | POA: Diagnosis not present

## 2023-03-28 DIAGNOSIS — I2510 Atherosclerotic heart disease of native coronary artery without angina pectoris: Secondary | ICD-10-CM | POA: Diagnosis not present

## 2023-03-28 DIAGNOSIS — R509 Fever, unspecified: Secondary | ICD-10-CM | POA: Diagnosis not present

## 2023-03-28 DIAGNOSIS — E1151 Type 2 diabetes mellitus with diabetic peripheral angiopathy without gangrene: Secondary | ICD-10-CM | POA: Diagnosis not present

## 2023-03-28 DIAGNOSIS — I6523 Occlusion and stenosis of bilateral carotid arteries: Secondary | ICD-10-CM | POA: Diagnosis not present

## 2023-03-28 DIAGNOSIS — Z7409 Other reduced mobility: Secondary | ICD-10-CM | POA: Diagnosis not present

## 2023-03-28 DIAGNOSIS — R451 Restlessness and agitation: Secondary | ICD-10-CM | POA: Diagnosis not present

## 2023-03-28 DIAGNOSIS — I517 Cardiomegaly: Secondary | ICD-10-CM | POA: Diagnosis not present

## 2023-03-28 DIAGNOSIS — J9600 Acute respiratory failure, unspecified whether with hypoxia or hypercapnia: Secondary | ICD-10-CM | POA: Diagnosis not present

## 2023-03-28 DIAGNOSIS — J690 Pneumonitis due to inhalation of food and vomit: Secondary | ICD-10-CM | POA: Diagnosis not present

## 2023-03-28 DIAGNOSIS — I639 Cerebral infarction, unspecified: Secondary | ICD-10-CM

## 2023-03-28 DIAGNOSIS — K219 Gastro-esophageal reflux disease without esophagitis: Secondary | ICD-10-CM | POA: Diagnosis not present

## 2023-03-28 DIAGNOSIS — E1165 Type 2 diabetes mellitus with hyperglycemia: Secondary | ICD-10-CM | POA: Diagnosis not present

## 2023-03-28 DIAGNOSIS — I6503 Occlusion and stenosis of bilateral vertebral arteries: Secondary | ICD-10-CM | POA: Diagnosis not present

## 2023-03-28 DIAGNOSIS — R29709 NIHSS score 9: Secondary | ICD-10-CM | POA: Diagnosis not present

## 2023-03-28 DIAGNOSIS — I7121 Aneurysm of the ascending aorta, without rupture: Secondary | ICD-10-CM | POA: Diagnosis not present

## 2023-03-28 DIAGNOSIS — E7800 Pure hypercholesterolemia, unspecified: Secondary | ICD-10-CM | POA: Diagnosis not present

## 2023-03-28 DIAGNOSIS — R918 Other nonspecific abnormal finding of lung field: Secondary | ICD-10-CM | POA: Diagnosis not present

## 2023-03-28 DIAGNOSIS — G459 Transient cerebral ischemic attack, unspecified: Secondary | ICD-10-CM | POA: Diagnosis not present

## 2023-03-28 DIAGNOSIS — I6521 Occlusion and stenosis of right carotid artery: Secondary | ICD-10-CM | POA: Diagnosis not present

## 2023-03-28 DIAGNOSIS — Q2112 Patent foramen ovale: Secondary | ICD-10-CM | POA: Diagnosis not present

## 2023-03-28 DIAGNOSIS — R569 Unspecified convulsions: Secondary | ICD-10-CM | POA: Diagnosis not present

## 2023-03-28 DIAGNOSIS — Z4682 Encounter for fitting and adjustment of non-vascular catheter: Secondary | ICD-10-CM | POA: Diagnosis not present

## 2023-03-28 HISTORY — DX: Cerebral infarction, unspecified: I63.9

## 2023-04-03 ENCOUNTER — Telehealth: Payer: Self-pay | Admitting: Cardiovascular Disease

## 2023-04-03 NOTE — Telephone Encounter (Signed)
Seth Bollman, MD  Lars Mage, RN; P Cv Div [address] Scheduling Good morning. This is a patient of mine who was just released from [facility] after a stroke. Could you add him on to my schedule this Thursday for a post-hospital cardiology follow-up visit?  Thank you!

## 2023-04-03 NOTE — Telephone Encounter (Signed)
Patient is requesting call back to discuss getting heart monitor ordered and to also discuss appt and possibly getting a sooner time. Please advise.

## 2023-04-03 NOTE — Telephone Encounter (Signed)
Spoke with the patient and added him on to Dr. Earmon Phoenix schedule for Thursday. Patient is aware.

## 2023-04-05 ENCOUNTER — Encounter: Payer: Self-pay | Admitting: Cardiovascular Disease

## 2023-04-05 ENCOUNTER — Other Ambulatory Visit: Payer: Self-pay | Admitting: Cardiovascular Disease

## 2023-04-05 ENCOUNTER — Ambulatory Visit: Payer: BC Managed Care – PPO | Attending: Cardiovascular Disease | Admitting: Cardiovascular Disease

## 2023-04-05 ENCOUNTER — Ambulatory Visit (INDEPENDENT_AMBULATORY_CARE_PROVIDER_SITE_OTHER): Payer: BC Managed Care – PPO

## 2023-04-05 VITALS — BP 108/70 | HR 97 | Ht 71.0 in | Wt 201.4 lb

## 2023-04-05 DIAGNOSIS — I739 Peripheral vascular disease, unspecified: Secondary | ICD-10-CM | POA: Diagnosis not present

## 2023-04-05 DIAGNOSIS — I491 Atrial premature depolarization: Secondary | ICD-10-CM

## 2023-04-05 DIAGNOSIS — I10 Essential (primary) hypertension: Secondary | ICD-10-CM

## 2023-04-05 DIAGNOSIS — I639 Cerebral infarction, unspecified: Secondary | ICD-10-CM

## 2023-04-05 DIAGNOSIS — I2510 Atherosclerotic heart disease of native coronary artery without angina pectoris: Secondary | ICD-10-CM | POA: Diagnosis not present

## 2023-04-05 DIAGNOSIS — I359 Nonrheumatic aortic valve disorder, unspecified: Secondary | ICD-10-CM | POA: Diagnosis not present

## 2023-04-05 DIAGNOSIS — I6521 Occlusion and stenosis of right carotid artery: Secondary | ICD-10-CM

## 2023-04-05 DIAGNOSIS — I440 Atrioventricular block, first degree: Secondary | ICD-10-CM | POA: Diagnosis not present

## 2023-04-05 DIAGNOSIS — I493 Ventricular premature depolarization: Secondary | ICD-10-CM | POA: Diagnosis not present

## 2023-04-05 NOTE — Progress Notes (Signed)
Cardiology Office Note:    Date:  04/05/2023   ID:  [name], DOB 06/20/1951, MRN [id number]  PCP:  Aliene Beams, MD    [facility] Providers Cardiologist:  [name], MD     Referring MD: Aliene Beams, MD   No chief complaint on file.   History of Present Illness:    [name] is a 72 y.o. male with a hx of:  Severe bicuspid AS S/p bioprosthetic AVR in 2012 Coronary artery disease  S/p 1v CABG (L-LCx) in 2012 Peripheral arterial disease  S/p fem-pop bypass graft 1997 S/p atherectomy of R ant tib, R pop; angioplasty R pop in 2017 (Dr. Randie Heinz) Intermittent claudication F/u by vasc surgery  Diabetes mellitus Hypertension  Hyperlipidemia Stroke 03/2023 - treated at [facility]  The patient is here alone today.  He was just hospitalized at [facility] with a stroke.  He was driving his car and reportedly was driving erratically.  He was pulled over by law enforcement and it was noted that he was experiencing a medical problem.  He was taken emergently to [facility] where he underwent acute stroke evaluation.  He ultimately required intubation and mechanical ventilation for about 24 to 48 hours per his report.  All of these records are not currently available to me.  I am able to review the imaging studies that were performed while he was in the hospital.  These include an echocardiogram, CTA of the head and neck, and brain MRI.  His echo showed essentially normal function of his aortic bioprosthesis with only mild aortic insufficiency, normal LV function, and a negative bubble study.  Since hospital discharge, he reports that he is doing well.  He complains of soreness in his buttock and legs and he thinks this is from laying in bed for so long.  He has no chest pain or pressure.  He does have mild shortness of breath with activity.  No orthopnea or PND.  No leg edema.  He is taking Plavix 75 mg daily.  He was noted to have a moderate right  carotid stenosis and it was recommended that he follow closely with vascular surgery.  Past Medical History:  Diagnosis Date   Aortic stenosis    a. severe bicuspid AV stenosis s/p pericardial tissue valve 2012.   Arthritis    Coronary artery disease    a. s/p CABG (LIMA-LCx) at time of AVR 2012, mild nonobstructive LAD and RCA stenoses. // Myoview 03/2022: EF 48, no ischemia; low risk   Depression    Depression    Diabetes mellitus without complication (HCC)    ED (erectile dysfunction)    Encounter for long-term (current) use of high-risk medication 06/30/2015   Fatty liver    Femoral-popliteal bypass graft occlusion, right [facility]) 1997   Dr. 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   Obesity    Obstructive sleep apnea 01/10/2021   PAD (peripheral artery disease) (HCC)    PONV (postoperative nausea and vomiting)    PVD (peripheral vascular  disease) [facility])    Thoracic aortic aneurysm (HCC) 05/09/2022   Echocardiogram 10/23: EF 55-60, no RWMA, mild LVH, normal RVSF, mild MR, s/p AVR with mild AI and normal gradient (15 mmHg), mod dilation of aortic root and ascending aorta (45 mm).  Chest CT 10/23: Ascending thoracic aorta 4.1 cm; aortic atherosclerosis    Past Surgical History:  Procedure Laterality Date   AORTIC VALVE REPLACEMENT  07/18/2011   Procedure: AORTIC VALVE REPLACEMENT (AVR);  Surgeon: Delight Ovens, MD;  Location: [facility] OR;  Service: Open Heart Surgery;  Laterality: N/A;   CARDIAC CATHETERIZATION  2009, 07/11/11   Dr. Eldridge Dace   CORONARY ARTERY BYPASS GRAFT  07/18/2011   Procedure: CORONARY ARTERY BYPASS GRAFTING (CABG);  Surgeon: Delight Ovens, MD;  Location: [facility] OR;  Service: Open Heart Surgery;  Laterality: N/A;  times one to mammary artery, left   fem stent Right 2017   FEMORAL ARTERY - POPLITEAL ARTERY BYPASS GRAFT     KNEE ARTHROSCOPY     left   MRI     to visualize aortic valve   PERIPHERAL VASCULAR CATHETERIZATION N/A 07/12/2016   Procedure: Abdominal Aortogram w/Lower Extremity;  Surgeon: Maeola Harman, MD;  Location: [facility] INVASIVE CV LAB;  Service: Cardiovascular;  Laterality: N/A;   PERIPHERAL VASCULAR CATHETERIZATION Right 07/12/2016   Procedure: Peripheral Vascular Atherectomy;  Surgeon: Maeola Harman, MD;  Location: [facility] INVASIVE CV LAB;  Service: Cardiovascular;  Laterality: Right;  Anterior Tibial and Popliteal   ROTATOR CUFF REPAIR     Left   US ECHOCARDIOGRAPHY      Current Medications: Current Meds  Medication Sig   Adalimumab 40 MG/0.8ML PNKT Inject 40 mg into the skin every 14 (fourteen) days.   amoxicillin (AMOXIL) 500 MG capsule TAKE 4 CAPSULES ONE HOUR BEFORE DENTAL APPOINTMENT.   atorvastatin (LIPITOR) 80 MG tablet Take 1 tablet (80 mg total) by mouth daily.   dapagliflozin propanediol (FARXIGA) 5 MG TABS tablet Take 5 mg by mouth daily.   enalapril (VASOTEC) 2.5 MG tablet Take 2.5 mg by mouth daily.   fluticasone (FLONASE) 50 MCG/ACT nasal spray Place into both nostrils.   furosemide (LASIX) 20 MG tablet TAKE ONE TABLET BY MOUTH DAILY   lansoprazole (PREVACID) 15 MG capsule Take 15 mg by mouth daily as needed. For acid reflux   metoprolol tartrate (LOPRESSOR) 50 MG tablet TAKE (1/2) TABLET TWICE DAILY.   tamsulosin (FLOMAX) 0.4 MG CAPS capsule Take 0.4 mg by mouth daily.    [DISCONTINUED] aspirin 81 MG tablet Take 1 tablet (81 mg total) by mouth daily.   [DISCONTINUED] sodium chloride (OCEAN) 0.65 % SOLN nasal spray Place 1 spray into both nostrils as needed for congestion.     Allergies:   Clopidogrel, Sulfamethoxazole-trimethoprim, Codeine, Flexeril [cyclobenzaprine],  and Morphine and codeine   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   Tobacco Use   Smoking status: Never    Passive exposure: Never   Smokeless tobacco: Never  Vaping Use   Vaping status: Never Used  Substance and Sexual Activity   Alcohol use: No   Drug use: No   Sexual activity: Not on file  Other Topics Concern   Not on  file  Social History Narrative   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   Left handed   Caffeine: about 1 cups daily   Social Determinants of Health   Financial Resource Strain: Low Risk  (03/30/2023)   Received [facility]   Overall Financial Resource Strain (CARDIA)    Difficulty of Paying Living Expenses: Not hard at all  Food Insecurity: No Food Insecurity (03/30/2023)   Received from [facility]   Hunger Vital Sign    Worried About Running Out of Food in the Last Year: Never true    Ran Out of Food in the Last Year: Never true  Transportation Needs: No Transportation Needs (03/30/2023)   Received from [facility] - Transportation    Lack of Transportation (Medical): No    Lack of Transportation (Non-Medical): No  Physical Activity: Sufficiently Active (07/04/2021)   Received from [facility]   Exercise Vital Sign    Days of Exercise per Week: 5 days    Minutes of Exercise per Session: 30 min  Stress: No Stress Concern Present (03/30/2023)   Received from [facility] - Occupational Stress Questionnaire    Feeling of Stress : Not at all  Social Connections: Unknown (11/28/2021)   Received from [facility]   Social Network    Social Network: Not on file     Family History: The patient's family  history includes Breast cancer in his sister; Cancer in his maternal grandfather; Diabetes in his father; Healthy in his brother and maternal grandfather; Heart disease in his maternal grandmother; Hypertension in his father; Lung cancer in his father. There is no history of Migraines, Headache, Colon cancer, Pancreatic cancer, Stomach cancer, Esophageal cancer, Sleep apnea, Colon polyps, or Rectal cancer.  ROS:   Please see the history of present illness.    All other systems reviewed and are negative.  EKGs/Labs/Other Studies Reviewed:    The following studies were reviewed today: CT Chest: IMPRESSION:  1. 4.3 cm aneurysm of the ascending aorta.  2.  Areas of atelectasis or infiltrate in the right upper lobe, right lower lobe, and left lower lobe, worse in the right lower lobe.  3.  6 mm pleural-based nodule superior segment left lower lobe.  EKG Interpretation Date/Time:  Thursday April 05 2023 09:09:20 EDT Ventricular Rate:  97 PR Interval:  210 QRS Duration:  86 QT Interval:  378 QTC Calculation: 480 R Axis:   52  Text Interpretation: Sinus rhythm with 1st degree A-V block with Premature supraventricular complexes and with occasional Premature ventricular complexes ST & T wave abnormality, consider lateral ischemia Prolonged QT When compared with ECG of 26-Apr-2022 08:53, St-T changes less pronounced than prior Confirmed by [name] [phone]) on 04/05/2023 9:17:46 AM   Echo: Left Ventricle: Systolic function is normal. EF: 55-60%.    Right Ventricle: Systolic function is low normal.    Left Atrium: Injection of agitated saline documents no interatrial  shunt.  However the bubble study is somewhat suboptimal because of image  quality.   Aortic Valve: Mild aortic valve regurgitation.   Brain MRI: IMPRESSION:  Tiny acute/subacute infarcts involving the right precentral gyrus, right centrum semiovale, and left middle frontal gyrus.   CTA Head/Neck: Results - CT Code  Stroke Head Neck Angio (03/28/2023 12:37 PM EDT) Impressions  03/28/2023 1:17 PM EDT  IMPRESSION:  1.  Intracranially there is no evidence of large vessel arterial occlusion  or significant stenosis. Please note a limited evaluation of the distal intracranial arteries secondary to phase of contrast imaging/surrounding venous enhancement.  2.  There is a punctate 1 mm calcification at the distal branching point of a M2 segment of the right MCA, the vessels proximal and distal to this point appear patent. This could represent a vascular calcification, small calcific embolus not excluded. If there is no contraindication and continued clinical concern suggest noncontrast MRI brain.  3.  The neck arteries are patent. Moderate stenosis at the origin of left vertebral artery. Moderate stenosis at the origin of the right cervical ICA, approximately 59-64% stenosis.  4.  Ectasia of the visualized ascending thoracic aorta, measuring at least 4 cm in axial dimension. Suggest a noncontrast chest CT to evaluate the remaining thoracic aorta/exclude aneurysm.  5.  Age-indeterminate mild superior endplate deformity at T4. Correlate for focal spinal tenderness.   Recent Labs: No results found for requested labs within last 365 days.  Recent Lipid Panel    Component Value Date/Time   CHOL 122 03/21/2018 0755   TRIG 138 03/21/2018 0755   HDL 42 03/21/2018 0755   CHOLHDL 2.9 03/21/2018 0755   CHOLHDL 4.5 05/12/2016 0854   VLDL 28 05/12/2016 0854   LDLCALC 52 03/21/2018 0755     Risk Assessment/Calculations:                Physical Exam:    VS:  BP 108/70   Pulse 97   Ht 5\' 11"  (1.803 m)   Wt 201 lb 6.4 oz (91.4 kg)   SpO2 98%   BMI 28.09 kg/m     Wt Readings from Last 3 Encounters:  04/05/23 201 lb 6.4 oz (91.4 kg)  12/19/22 210 lb 3.2 oz (95.3 kg)  12/13/22 213 lb (96.6 kg)     GEN:  Well nourished, well developed in no acute distress HEENT: Normal NECK: No JVD; No carotid  bruits LYMPHATICS: No lymphadenopathy CARDIAC: RRR, no murmurs, rubs, gallops RESPIRATORY:  Clear to auscultation without rales, wheezing or rhonchi  ABDOMEN: Soft, non-tender, non-distended MUSCULOSKELETAL:  No edema; No deformity  SKIN: Warm and dry NEUROLOGIC:  Alert and oriented x 3 PSYCHIATRIC:  Normal affect   ASSESSMENT:    1. Coronary artery disease involving native coronary artery of native heart without angina pectoris   2. Peripheral vascular disease (HCC)   3. Aortic valve disease   4. Carotid stenosis, right   5. Primary hypertension    PLAN:    In order of problems listed above:  Stable on current medical regimen.  No changes are made today.  The patient is on clopidogrel and a high intensity statin drug. No new claudication symptoms.  Patient has undergone multiple interventions including femoropopliteal bypass and tibial PTA.  Continue medical management. The patient's aortic bioprosthesis is functioning normally on his recent echocardiogram. Will refer the patient back to vascular surgery for evaluation of potential symptomatic right carotid stenosis.  Imaging studies from [facility] are reviewed and this demonstrated a moderate stenosis of the right internal carotid artery. Blood pressure is well-controlled on current regimen of enalapril and metoprolol.  With the patient's recent stroke, I am going to perform a 30-day monitor to evaluate for atrial fibrillation.  He does have some PACs and PVCs.  Also will refer him to vascular surgery for carotid evaluation.           Medication Adjustments/Labs and Tests Ordered: Current medicines are reviewed at length with the patient  today.  Concerns regarding medicines are outlined above.  Orders Placed This Encounter  Procedures   EKG 12-Lead   No orders of the defined types were placed in this encounter.   Patient Instructions  Medication Instructions:  Your physician recommends that you continue on your  current medications as directed. Please refer to the Current Medication list given to you today.  *If you need a refill on your cardiac medications before your next appointment, please call your pharmacy*   Lab Work: NONE If you have labs (blood work) drawn today and your tests are completely normal, you will receive your results only by: MyChart Message (if you have MyChart) OR A paper copy in the mail If you have any lab test that is abnormal or we need to change your treatment, we will call you to review the results.   Testing/Procedures: 30-day heart monitor Your physician has recommended that you wear an event monitor. Event monitors are medical devices that record the heart's electrical activity. Doctors most often [place] these monitors to diagnose arrhythmias. Arrhythmias are problems with the speed or rhythm of the heartbeat. The monitor is a small, portable device. You can wear one while you do your normal daily activities. This is usually used to diagnose what is causing palpitations/syncope (passing out).  Vein and vascular referral Randie Heinz)  Follow-Up: At [facility], you and your health needs are our priority.  As part of our continuing mission to provide you with exceptional heart care, we have created designated Provider Care Teams.  These Care Teams include your primary Cardiologist (physician) and Advanced Practice Providers (APPs -  Physician Assistants and Nurse Practitioners) who all work together to provide you with the care you need, when you need it.  Your next appointment:   6 month(s)  Provider:   [name], MD        Signed, [name], MD  04/05/2023 9:36 AM    [facility]

## 2023-04-05 NOTE — Patient Instructions (Signed)
Medication Instructions:  Your physician recommends that you continue on your current medications as directed. Please refer to the Current Medication list given to you today.  *If you need a refill on your cardiac medications before your next appointment, please call your pharmacy*   Lab Work: NONE If you have labs (blood work) drawn today and your tests are completely normal, you will receive your results only by: MyChart Message (if you have MyChart) OR A paper copy in the mail If you have any lab test that is abnormal or we need to change your treatment, we will call you to review the results.   Testing/Procedures: 30-day heart monitor Your physician has recommended that you wear an event monitor. Event monitors are medical devices that record the heart's electrical activity. Doctors most often [place] these monitors to diagnose arrhythmias. Arrhythmias are problems with the speed or rhythm of the heartbeat. The monitor is a small, portable device. You can wear one while you do your normal daily activities. This is usually used to diagnose what is causing palpitations/syncope (passing out).  Vein and vascular referral Randie Heinz)  Follow-Up: At [facility], you and your health needs are our priority.  As part of our continuing mission to provide you with exceptional heart care, we have created designated Provider Care Teams.  These Care Teams include your primary Cardiologist (physician) and Advanced Practice Providers (APPs -  Physician Assistants and Nurse Practitioners) who all work together to provide you with the care you need, when you need it.  Your next appointment:   6 month(s)  Provider:   [name], MD

## 2023-04-09 ENCOUNTER — Telehealth: Payer: Self-pay | Admitting: Cardiovascular Disease

## 2023-04-09 ENCOUNTER — Telehealth: Payer: Self-pay | Admitting: Adult Health

## 2023-04-09 NOTE — Telephone Encounter (Signed)
Patient informed to ship back extra monitor serial # [id number] in box with prepaid return UPS shipping label on it.  Informed him it would be ok to wait until he ships back the monitor he is wearing.  Patient assured he would only be billed for the monitor he is wearing.  [employer] representative Fatima Blank contacted to inform him patient was shipped another monitor 04/06/23.  He had been called 04/05/23 and given serial # of monitor applied in office 04/05/23, # [id number].  Patient is transmitting and recordings are posted correctly on [employer].

## 2023-04-09 NOTE — Telephone Encounter (Signed)
Pt called needing to speak to the NP regarding the increase of incidents per hour he is having. Pt states that this coming Wednesday will be 2 weeks since he had his stroke and now all of a sudden he is having 21 incidents per hour and he would like to discuss. Please advise.

## 2023-04-09 NOTE — Telephone Encounter (Signed)
Contacted pt, LVM rq call back  

## 2023-04-09 NOTE — Telephone Encounter (Signed)
Pt states that he received heart monitor at visit on 9/5 but another came ion the mail today. He would like a callback as to what to do with monitor that was delivered today. Please advise

## 2023-04-10 DIAGNOSIS — R569 Unspecified convulsions: Secondary | ICD-10-CM | POA: Diagnosis not present

## 2023-04-10 DIAGNOSIS — Z1330 Encounter for screening examination for mental health and behavioral disorders, unspecified: Secondary | ICD-10-CM | POA: Diagnosis not present

## 2023-04-10 DIAGNOSIS — E782 Mixed hyperlipidemia: Secondary | ICD-10-CM | POA: Diagnosis not present

## 2023-04-10 DIAGNOSIS — I10 Essential (primary) hypertension: Secondary | ICD-10-CM | POA: Diagnosis not present

## 2023-04-10 DIAGNOSIS — Z8673 Personal history of transient ischemic attack (TIA), and cerebral infarction without residual deficits: Secondary | ICD-10-CM | POA: Diagnosis not present

## 2023-04-10 NOTE — Telephone Encounter (Signed)
Contacted pt, LVM per DPR, informing him NP reviewed his reports. Download overall looks good. Apneic events was at 5. This considered relatively normal. She would not make any adjustments at this time. Number provided to call back with any questions.

## 2023-04-10 NOTE — Telephone Encounter (Signed)
Pt returned phone call, transferred to nurse, Sheryle Hail.

## 2023-04-10 NOTE — Telephone Encounter (Signed)
Download overall looks good.  Apneic events was at 5.  This considered relatively normal.  I would not make any adjustments at this time.

## 2023-04-10 NOTE — Telephone Encounter (Addendum)
Pt returned call, stated his report has increased in events. He denied feeling them, waking up or gasping for breath. He was just concerned based off of the reports readings. He is stable, no other concerns. Do you feel the need to adjust anything?

## 2023-04-12 DIAGNOSIS — R569 Unspecified convulsions: Secondary | ICD-10-CM | POA: Diagnosis not present

## 2023-04-12 DIAGNOSIS — E1169 Type 2 diabetes mellitus with other specified complication: Secondary | ICD-10-CM | POA: Diagnosis not present

## 2023-04-12 DIAGNOSIS — I69354 Hemiplegia and hemiparesis following cerebral infarction affecting left non-dominant side: Secondary | ICD-10-CM | POA: Diagnosis not present

## 2023-04-12 DIAGNOSIS — I10 Essential (primary) hypertension: Secondary | ICD-10-CM | POA: Diagnosis not present

## 2023-04-17 ENCOUNTER — Telehealth: Payer: Self-pay | Admitting: Cardiovascular Disease

## 2023-04-17 MED ORDER — APIXABAN 5 MG PO TABS: 5.0000 mg | ORAL_TABLET | Freq: Two times a day (BID) | ORAL | 5 refills | Status: DC

## 2023-04-17 NOTE — Telephone Encounter (Signed)
Abnormal results. Please advise

## 2023-04-17 NOTE — Telephone Encounter (Signed)
Patient returned my call.  He was not aware of the abnormal rhythm.  Thinks he was out finishing up a walk at the time of the reading.  I reviewed recommendations from Dr. Excell Seltzer.  He is scheduled in afib clinic later this week.  Will stop Plavix and will pick up Eliquis 5 mg and start tonight taking one tablet twice a day.  Adv should his heart rate speed up in response to this rhythm he may feel fast or pounding heartbeat in his chest, get SOB or develop chest pain.  Adv to call if this occurs.   Pt appreciative for assistance provided today.

## 2023-04-17 NOTE — Telephone Encounter (Signed)
Call from Home at [employer] critical EKG.  1:17 pm cst first doc afib RVR, avg HR 102 bpm.  Auto generated.     Cardiac Monitor Alert  Date of alert:  04/17/2023   Patient Name: [name]  DOB: [birth year]  MRN: [id number]   [facility] Cardiologist: [name], MD  [facility] EP:  None    Monitor Information: Cardiac Event Monitor [[employer]]  Reason:  From Dr. Earmon Phoenix last ov note 04/05/23:  With the patient's recent stroke, I am going to perform a 30-day monitor to evaluate for atrial fibrillation.  Ordering provider:  Dr. Excell Seltzer   Alert Atrial Fibrillation/Flutter RVR at 102 bpm This is the 1st alert for this rhythm.  The patient has no hx of Atrial Fibrillation/Flutter.  The patient is not currently on anticoagulation.  Next Cardiology Appointment   Date:  ______  Provider:  _______  The patient could NOT be reached by telephone today.  Left message for him to call back.  Will try to work him into afib clinic.  Sending him a message through the portal as well. The patient was referred to the Atrial Fibrillation Clinic.  Appt date/time:  availability tomorrow am 9:00 am.  UNABLE TO REACH PATIENT.   I reviewed w Dr. Excell Seltzer.  Confirmed atrial fibrillation.  Recommendation to stop Plavix, start Eliquis 5 mg BID, close follow up in afib clinic.  Awaiting return call from patient. Lendon Ka, RN  04/17/2023 2:57 PM

## 2023-04-18 ENCOUNTER — Encounter: Payer: Self-pay | Admitting: Vascular Surgery

## 2023-04-18 ENCOUNTER — Ambulatory Visit (INDEPENDENT_AMBULATORY_CARE_PROVIDER_SITE_OTHER): Payer: BC Managed Care – PPO | Admitting: Vascular Surgery

## 2023-04-18 VITALS — BP 108/76 | HR 83 | Temp 97.7°F | Resp 20 | Ht 71.0 in | Wt 199.0 lb

## 2023-04-18 DIAGNOSIS — I63239 Cerebral infarction due to unspecified occlusion or stenosis of unspecified carotid arteries: Secondary | ICD-10-CM

## 2023-04-18 NOTE — Progress Notes (Signed)
Patient ID: [name], male   DOB: 1950-12-10, 72 y.o.   MRN: [id number]  Reason for Consult: No chief complaint on file.   Referred by [name], MD  Subjective:     HPI:  [name] is a 72 y.o. male followed in our office for remote history of right anterior tibial and popliteal artery drug-coated balloon angioplasty and SFA to below-knee popliteal artery bypass in 1997 but subsequently occluded.  He was last seen here last May.  This August he was noted to be driving erratically was found to have seizure-like activity and ultimately found to have left sided weakness with left-sided deficit and left facial droop with stroke scale score of 9 and was admitted to [facility] in [place] and underwent interventional radiology attempted thrombectomy with no intervention undertaken.  He did have an MRI of his head which demonstrated tiny cute and subacute infarcts of the right precentral gyrus, right centrum semiovale and left mid frontal gyrus.  Has subsequently been evaluated by neurology as an outpatient and has been initiated on Keppra due to possible seizure-like activity.  He was on aspirin at the time of the incident and was subsequently switched to Plavix but now on Eliquis and no Plavix and he is also on 40 mg of Lipitor nightly.  Yesterday his heart monitor demonstrated atrial fibrillation when she had never knowing of the past.  States that this was asymptomatic at the time.  He has thankfully regained most of his strength in his left arm and leg.  From a leg standpoint he has been walking without limitation other than mild claudication but did walk 2 miles yesterday.  Past Medical History:  Diagnosis Date   Aortic stenosis    a. severe bicuspid AV stenosis s/p pericardial tissue valve 2012.   Arthritis    Coronary artery disease    a. s/p CABG (LIMA-LCx) at time of AVR 2012, mild nonobstructive LAD and RCA stenoses. // Myoview 03/2022: EF 48, no ischemia; low risk    Depression    Depression    Diabetes mellitus without complication (HCC)    ED (erectile dysfunction)    Encounter for long-term (current) use of high-risk medication 06/30/2015   Fatty liver    Femoral-popliteal bypass graft occlusion, right [facility]) 1997   Dr. 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   Obesity    Obstructive sleep apnea 01/10/2021   PAD (peripheral artery disease) (HCC)    PONV (postoperative nausea and vomiting)    PVD (peripheral vascular disease) (HCC)    Thoracic aortic aneurysm (HCC) 05/09/2022   Echocardiogram 10/23: EF 55-60, no RWMA, mild LVH, normal RVSF, mild MR, s/p AVR with mild AI and normal gradient (15 mmHg), mod dilation of aortic root and ascending aorta (45 mm).  Chest CT 10/23: Ascending thoracic aorta 4.1 cm; aortic atherosclerosis   Family History  Problem Relation Age of Onset   Hypertension Father    Diabetes Father    Lung cancer Father    Breast cancer Sister  Healthy Brother    Heart disease Maternal Grandmother    Healthy Maternal Grandfather    Cancer Maternal Grandfather    Migraines Neg Hx    Headache Neg Hx    Colon cancer Neg Hx    Pancreatic cancer Neg Hx    Stomach cancer Neg Hx    Esophageal cancer Neg Hx    Sleep apnea Neg Hx    Colon polyps Neg Hx    Rectal cancer Neg Hx    Past Surgical History:  Procedure Laterality Date   AORTIC VALVE REPLACEMENT  07/18/2011   Procedure: AORTIC VALVE REPLACEMENT (AVR);  Surgeon: Delight Ovens, MD;  Location: [facility] OR;  Service: Open Heart Surgery;  Laterality: N/A;   CARDIAC CATHETERIZATION  2009, 07/11/11   Dr. Eldridge Dace   CORONARY ARTERY BYPASS  GRAFT  07/18/2011   Procedure: CORONARY ARTERY BYPASS GRAFTING (CABG);  Surgeon: Delight Ovens, MD;  Location: [facility] OR;  Service: Open Heart Surgery;  Laterality: N/A;  times one to mammary artery, left   fem stent Right 2017   FEMORAL ARTERY - POPLITEAL ARTERY BYPASS GRAFT     KNEE ARTHROSCOPY     left   MRI     to visualize aortic valve   PERIPHERAL VASCULAR CATHETERIZATION N/A 07/12/2016   Procedure: Abdominal Aortogram w/Lower Extremity;  Surgeon: Maeola Harman, MD;  Location: [facility] INVASIVE CV LAB;  Service: Cardiovascular;  Laterality: N/A;   PERIPHERAL VASCULAR CATHETERIZATION Right 07/12/2016   Procedure: Peripheral Vascular Atherectomy;  Surgeon: Maeola Harman, MD;  Location: [facility] INVASIVE CV LAB;  Service: Cardiovascular;  Laterality: Right;  Anterior Tibial and Popliteal   ROTATOR CUFF REPAIR     Left   US ECHOCARDIOGRAPHY      Short Social History:  Social History   Tobacco Use   Smoking status: Never    Passive exposure: Never   Smokeless tobacco: Never  Substance Use Topics   Alcohol use: No    Allergies  Allergen Reactions   Clopidogrel Itching   Sulfamethoxazole-Trimethoprim Other (See Comments)    Dizziness, flushed   Codeine Other (See Comments)    Blood pressure drops   Flexeril [Cyclobenzaprine]     sedation   Morphine And Codeine Itching    Current Outpatient Medications  Medication Sig Dispense Refill   Adalimumab 40 MG/0.8ML PNKT Inject 40 mg into the skin every 14 (fourteen) days.     amoxicillin (AMOXIL) 500 MG capsule TAKE 4 CAPSULES ONE HOUR BEFORE DENTAL APPOINTMENT. 8 capsule 0   apixaban (ELIQUIS) 5 MG TABS tablet Take 1 tablet (5 mg total) by mouth 2 (two) times daily. 60 tablet 5   atorvastatin (LIPITOR) 80 MG tablet Take 1 tablet (80 mg total) by mouth daily. 90 tablet 0   dapagliflozin propanediol (FARXIGA) 5 MG TABS tablet Take 5 mg by mouth daily.     enalapril (VASOTEC) 2.5 MG tablet Take 2.5 mg by mouth daily.      fluticasone (FLONASE) 50 MCG/ACT nasal spray Place into both nostrils.     furosemide (LASIX) 20 MG tablet TAKE ONE TABLET BY MOUTH DAILY 90 tablet 0   lansoprazole (PREVACID) 15 MG capsule Take 15 mg by mouth daily as needed. For acid reflux     metoprolol tartrate (LOPRESSOR) 50 MG tablet TAKE (1/2) TABLET TWICE DAILY. 90 tablet 0   tamsulosin (FLOMAX) 0.4 MG CAPS capsule Take 0.4 mg by mouth daily.      No current facility-administered medications for this visit.  Review of Systems  Constitutional:  Constitutional negative. HENT: HENT negative.  Eyes: Eyes negative.  Respiratory: Respiratory negative.  Cardiovascular: Positive for claudication.  GI: Gastrointestinal negative.  Musculoskeletal: Musculoskeletal negative.  Skin: Skin negative.  Neurological: Positive for focal weakness.  Hematologic: Hematologic/lymphatic negative.  Psychiatric: Psychiatric negative.        Objective:  Objective   Vitals:   04/18/23 0822 04/18/23 0824  BP: 113/74 108/76  Pulse: 83   Resp: 20   Temp: 97.7 F (36.5 C)   SpO2: 97%      Physical Exam HENT:     Head: Normocephalic.     Nose: Nose normal.  Eyes:     Pupils: Pupils are equal, round, and reactive to light.  Neck:     Vascular: No carotid bruit.  Cardiovascular:     Rate and Rhythm: Normal rate.  Pulmonary:     Effort: Pulmonary effort is normal.  Abdominal:     General: Abdomen is flat.  Musculoskeletal:        General: Normal range of motion.     Cervical back: Normal range of motion and neck supple.  Skin:    General: Skin is warm.     Capillary Refill: Capillary refill takes less than 2 seconds.  Neurological:     General: No focal deficit present.     Mental Status: He is alert.  Psychiatric:        Mood and Affect: Mood normal.    Data: MRI Head WO Contrast 03/29/23 Tiny acute/subacute infarcts involving the right precentral gyrus, right centrum semiovale, and left middle frontal gyrus.   CT  angiography: 03/28/23 1. Intracranially there is no evidence of large vessel arterial occlusion or significant stenosis. Please note a limited evaluation of the distal intracranial arteries secondary to phase of contrast imaging/surrounding venous enhancement.   2. There is a punctate 1 mm calcification at the distal branching point of a M2 segment of the right MCA, the vessels proximal and distal to this point appear patent. This could represent a vascular calcification, small calcific embolus not excluded. If there is no contraindication and continued clinical concern suggest noncontrast MRI brain.  3. The neck arteries are patent. Moderate stenosis at the origin of left vertebral artery. Moderate stenosis at the origin of the right cervical ICA, approximately 59-64% stenosis.  4. Ectasia of the visualized ascending thoracic aorta, measuring at least 4 cm in axial dimension. Suggest a noncontrast chest CT to evaluate the remaining thoracic aorta/exclude aneurysm.  5. Age-indeterminate mild superior endplate deformity at T4. Correlate for focal spinal tenderness.      Assessment/Plan:    72 year old male with recent CVA that was bilateral by MRI report at [facility].  He was found to have stenosis in his right ICA by CTA I do not have these images available but only the read in the evaluation by interventional radiology and neurology.  No intervention was undertaken at the time of attempted thrombectomy by interventional radiology.  Given the bilateral nature of his lesions on MRI and recent diagnosis of atrial fibrillation we have discussed continued nonoperative management particularly given that he was just started on Eliquis yesterday for new diagnosis of atrial fibrillation via heart monitor that he is wearing today.  We have discussed the signs and symptoms of stroke which she is well aware of and any further symptoms may change our management.  Short of that I will see him back in 3 to 4 months with  carotid duplex  for further evaluation but unlikely would require right carotid endarterectomy unless there is a pressing issue is I do not think this is asymptomatic lesion at this time.    They are also possibly going to obtain images from [facility] to be sent here for my review.     Maeola Harman MD [facility]

## 2023-04-19 ENCOUNTER — Telehealth: Payer: Self-pay | Admitting: Cardiovascular Disease

## 2023-04-19 ENCOUNTER — Ambulatory Visit: Payer: BC Managed Care – PPO | Admitting: Cardiovascular Disease

## 2023-04-19 DIAGNOSIS — R569 Unspecified convulsions: Secondary | ICD-10-CM | POA: Diagnosis not present

## 2023-04-19 NOTE — Telephone Encounter (Signed)
See 9/17 encounter:  [place] with [facility] is following up to provide corrections to their previous report.  Phone #: [phone] Please ask for [place].

## 2023-04-19 NOTE — Telephone Encounter (Signed)
Tried to call number provided, no answer and then call was dropped.

## 2023-04-20 ENCOUNTER — Other Ambulatory Visit: Payer: Self-pay

## 2023-04-20 ENCOUNTER — Ambulatory Visit (HOSPITAL_COMMUNITY)
Admission: RE | Admit: 2023-04-20 | Discharge: 2023-04-20 | Disposition: A | Discharge status: Home / Self Care | Payer: BC Managed Care – PPO | Source: Ambulatory Visit | Attending: Internal Medicine | Admitting: Internal Medicine

## 2023-04-20 VITALS — BP 108/80 | HR 80 | Ht 71.0 in | Wt 198.4 lb

## 2023-04-20 DIAGNOSIS — K219 Gastro-esophageal reflux disease without esophagitis: Secondary | ICD-10-CM | POA: Diagnosis not present

## 2023-04-20 DIAGNOSIS — D6869 Other thrombophilia: Secondary | ICD-10-CM | POA: Insufficient documentation

## 2023-04-20 DIAGNOSIS — Z951 Presence of aortocoronary bypass graft: Secondary | ICD-10-CM | POA: Diagnosis not present

## 2023-04-20 DIAGNOSIS — I480 Paroxysmal atrial fibrillation: Secondary | ICD-10-CM | POA: Diagnosis not present

## 2023-04-20 DIAGNOSIS — I4891 Unspecified atrial fibrillation: Secondary | ICD-10-CM

## 2023-04-20 DIAGNOSIS — Z7901 Long term (current) use of anticoagulants: Secondary | ICD-10-CM | POA: Insufficient documentation

## 2023-04-20 DIAGNOSIS — I2510 Atherosclerotic heart disease of native coronary artery without angina pectoris: Secondary | ICD-10-CM | POA: Insufficient documentation

## 2023-04-20 DIAGNOSIS — E785 Hyperlipidemia, unspecified: Secondary | ICD-10-CM | POA: Insufficient documentation

## 2023-04-20 DIAGNOSIS — G4733 Obstructive sleep apnea (adult) (pediatric): Secondary | ICD-10-CM | POA: Diagnosis not present

## 2023-04-20 DIAGNOSIS — Z8673 Personal history of transient ischemic attack (TIA), and cerebral infarction without residual deficits: Secondary | ICD-10-CM | POA: Insufficient documentation

## 2023-04-20 DIAGNOSIS — I63239 Cerebral infarction due to unspecified occlusion or stenosis of unspecified carotid arteries: Secondary | ICD-10-CM

## 2023-04-20 DIAGNOSIS — Z952 Presence of prosthetic heart valve: Secondary | ICD-10-CM | POA: Insufficient documentation

## 2023-04-20 DIAGNOSIS — I10 Essential (primary) hypertension: Secondary | ICD-10-CM | POA: Diagnosis not present

## 2023-04-20 DIAGNOSIS — E119 Type 2 diabetes mellitus without complications: Secondary | ICD-10-CM | POA: Diagnosis not present

## 2023-04-20 NOTE — Progress Notes (Signed)
Primary Care Physician: Aliene Beams, MD Primary Cardiologist: [name], MD Electrophysiologist: None     Referring Physician: Dr. Sidney Ace is a 72 y.o. male with a history of T2DM, HTN, HLD, PAD with history of femoral-popliteal bypass graft occlusion, GERD, OSA on CPAP, bicuspid aortic valve, aortic stenosis s/p valve replacement 2012, CAD s/p CABG 2012, CVA 8/24, and paroxysmal atrial fibrillation who presents for consultation in the [facility]rial Fibrillation [facility]. Seen by Dr. Excell Seltzer on 9/5 and with recent CVA placed 30-day monitor; alert for new onset Afib with RVR. Discontinued plavix. Patient is on Eliquis for a CHADS2VASC score of 6.  On evaluation today, he is currently in NSR. He has no bleeding issues on Eliquis. He has no cardiac awareness of arrhythmia; he was out walking when he received the phone call noting he was in Afib with RVR. He is currently still wearing the 30-day monitor.   Today, he denies symptoms of palpitations, chest pain, shortness of breath, orthopnea, PND, lower extremity edema, dizziness, presyncope, syncope, snoring, daytime somnolence, bleeding, or neurologic sequela. The patient is tolerating medications without difficulties and is otherwise without complaint today.    Atrial Fibrillation Risk Factors:  he does have symptoms or diagnosis of sleep apnea. he is compliant with CPAP therapy.  he has a BMI of Body mass index is 27.67 kg/m.[name] Filed Weights   04/20/23 0835  Weight: 90 kg    Current Outpatient Medications  Medication Sig Dispense Refill   Adalimumab 40 MG/0.8ML PNKT Inject 40 mg into the skin every 14 (fourteen) days.     amoxicillin (AMOXIL) 500 MG capsule TAKE 4 CAPSULES ONE HOUR BEFORE DENTAL APPOINTMENT. 8 capsule 0   apixaban (ELIQUIS) 5 MG TABS tablet Take 1 tablet (5 mg total) by mouth 2 (two) times daily. 60 tablet 5   atorvastatin (LIPITOR) 80 MG tablet Take 1 tablet (80 mg total) by mouth  daily. 90 tablet 0   Cholecalciferol (VITAMIN D-3 PO) Take 1 tablet by mouth daily.     citalopram (CELEXA) 20 MG tablet Take 20 mg by mouth daily.     Cyanocobalamin (VITAMIN B-12 PO) Take 1 tablet by mouth daily.     dapagliflozin propanediol (FARXIGA) 5 MG TABS tablet Take 5 mg by mouth daily.     enalapril (VASOTEC) 2.5 MG tablet Take 2.5 mg by mouth daily.     ezetimibe (ZETIA) 10 MG tablet Take 10 mg by mouth daily.     fluticasone (FLONASE) 50 MCG/ACT nasal spray Place 2 sprays into both nostrils daily.     furosemide (LASIX) 20 MG tablet TAKE ONE TABLET BY MOUTH DAILY 90 tablet 0   lansoprazole (PREVACID) 15 MG capsule Take 15 mg by mouth daily as needed. For acid reflux     levETIRAcetam (KEPPRA) 500 MG tablet Take 500 mg by mouth 2 (two) times daily.     metoprolol tartrate (LOPRESSOR) 50 MG tablet TAKE (1/2) TABLET TWICE DAILY. 90 tablet 0   tamsulosin (FLOMAX) 0.4 MG CAPS capsule Take 0.4 mg by mouth daily.      No current facility-administered medications for this encounter.    Atrial Fibrillation Management history:  Previous antiarrhythmic drugs: None Previous cardioversions: None Previous ablations: None Anticoagulation history: Eliquis   ROS- All systems are reviewed and negative except as per the HPI above.  Physical Exam: BP 108/80   Pulse 80   Ht 5\' 11"  (1.803 m)   Wt 90 kg  BMI 27.67 kg/m   GEN: Well nourished, well developed in no acute distress NECK: No JVD; No carotid bruits CARDIAC: Regular rate and rhythm, no murmurs, rubs, gallops RESPIRATORY:  Clear to auscultation without rales, wheezing or rhonchi  ABDOMEN: Soft, non-tender, non-distended EXTREMITIES:  No edema; No deformity   EKG today demonstrates  Vent. rate 80 BPM PR interval 192 ms QRS duration 92 ms QT/QTcB 384/442 ms P-R-T axes -1 40 -80 Sinus rhythm with marked sinus arrhythmia Cannot rule out Anterior infarct , age undetermined ST & T wave abnormality, consider inferior  ischemia Lateral ischemia Abnormal ECG When compared with ECG of 05-Apr-2023 09:09, Frequent PACs NO LONGER PRESENT Confirmed by Yates Decamp ([id number]) on 04/20/2023 9:37:23 AM  Echo 05/05/22 demonstrated   1. S/P AVR with mild AI and normal mean gradient of 15 mmHg).   2. Left ventricular ejection fraction, by estimation, is 55 to 60%. The  left ventricle has normal function. The left ventricle has no regional  wall motion abnormalities. There is mild concentric left ventricular  hypertrophy. Left ventricular diastolic  parameters are indeterminate.   3. Right ventricular systolic function is normal. The right ventricular  size is normal. Tricuspid regurgitation signal is inadequate for assessing  PA pressure.   4. The mitral valve is normal in structure. Mild mitral valve  regurgitation. No evidence of mitral stenosis.   5. The aortic valve has been repaired/replaced. Aortic valve  regurgitation is mild. No aortic stenosis is present. There is a 23 mm  Edwards bioprosthetic valve present in the aortic position.   6. Aortic dilatation noted. There is moderate dilatation of the aortic  root and of the ascending aorta, measuring 45 mm.   7. The inferior vena cava is normal in size with greater than 50%  respiratory variability, suggesting right atrial pressure of 3 mmHg.   ASSESSMENT & PLAN CHA2DS2-VASc Score = 6  The patient's score is based upon: CHF History: 0 HTN History: 1 Diabetes History: 1 Stroke History: 2 Vascular Disease History: 1 Age Score: 1 Gender Score: 0       ASSESSMENT AND PLAN: Paroxysmal Atrial Fibrillation (ICD10:  I48.0) The patient's CHA2DS2-VASc score is 6, indicating a 9.7% annual risk of stroke.    He is currently in NSR. Education provided about Afib. Discussion about medication treatments and ablation going forward if indicated. After discussion, we will proceed with conservative observation at this time and wait for event monitor results. Rhythm  monitoring device recommended.   Secondary Hypercoagulable State (ICD10:  D68.69) The patient is at significant risk for stroke/thromboembolism based upon his CHA2DS2-VASc Score of 6.  Continue Apixaban (Eliquis).  Discussion of necessity for anticoagulation to prevent stroke secondary to atrial fibrillation. Patient will continue Eliquis.    Follow up 1 month. Repeat CBC at that time.   Lake Bells, PA-C  Afib Clinic [facility] [address] [place], [place] [address] [phone]

## 2023-04-21 DIAGNOSIS — G4733 Obstructive sleep apnea (adult) (pediatric): Secondary | ICD-10-CM | POA: Diagnosis not present

## 2023-04-23 ENCOUNTER — Telehealth: Payer: Self-pay | Admitting: *Deleted

## 2023-04-23 NOTE — Telephone Encounter (Signed)
Monitor strips brought to my attention from monitor tech, showing the Day 13 critical event from 04/17/23 that Dr. Excell Seltzer reviewed and recommended stop Plavix, start Eliquis and f/u w afib clinic.   The other strip she brought is a "corrected version - atrial fibrillation determined to be tachyarrhythmia and artifact".   Reviewed this information with Dr. Excell Seltzer. He would like for patient to stay on current medications w no changes for now and to continue wearing monitor.

## 2023-04-30 ENCOUNTER — Telehealth: Payer: Self-pay | Admitting: *Deleted

## 2023-04-30 NOTE — Telephone Encounter (Signed)
Cardiac Monitor Alert  Date of alert:  04/30/2023   Patient Name: [name]  DOB: Apr 15, 1951  MRN: [id number]   [facility] Cardiologist: [name], MD  [facility] EP:  None    Monitor Information: Cardiac Event Monitor [[employer]]  Reason:  PAC's, PVC's.  Recent stroke Ordering provider:  Dr Excell Seltzer   Alert Sinus Bradycardia with run of V Tach(9 beats)Interpolated PVCs (4 in 1 min) This is the 1st alert for this rhythm.   Next Cardiology Appointment   Date:  05/18/23 Provider:  [name], PA  The patient was contacted today.  He is asymptomatic.   Arrhythmia, symptoms and history reviewed with Dr Excell Seltzer.   Plan:  continue current medications.    Other: Patient denies any symptoms.  Thinks he may have gotten up to go to the bathroom sometime around this time.  I told him to continue wearing monitor and we would call him back if Dr Excell Seltzer wanted to make any changes.   [name], RN  04/30/2023 8:51 AM

## 2023-05-02 NOTE — Therapy (Signed)
OUTPATIENT PHYSICAL THERAPY NEURO EVALUATION   Patient Name: [name] MRN: [id number] DOB:1951-03-25, 72 y.o., male Today's Date: 05/02/2023   PCP: Aliene Beams, MD REFERRING PROVIDER: Aliene Beams, MD  END OF SESSION:   Past Medical History:  Diagnosis Date   Aortic stenosis    a. severe bicuspid AV stenosis s/p pericardial tissue valve 2012.   Arthritis    Coronary artery disease    a. s/p CABG (LIMA-LCx) at time of AVR 2012, mild nonobstructive LAD and RCA stenoses. // Myoview 03/2022: EF 48, no ischemia; low risk   Depression    Depression    Diabetes mellitus without complication (HCC)    ED (erectile dysfunction)    Encounter for long-term (current) use of high-risk medication 06/30/2015   Fatty liver    Femoral-popliteal bypass graft occlusion, right [facility]) 1997   Dr. 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   Obesity    Obstructive sleep apnea 01/10/2021   PAD (peripheral artery disease) (HCC)    PONV (postoperative nausea and vomiting)    PVD (peripheral vascular disease) (HCC)    Thoracic aortic aneurysm (HCC) 05/09/2022   Echocardiogram 10/23: EF 55-60, no RWMA, mild LVH, normal RVSF, mild MR, s/p AVR with mild AI and normal gradient (15 mmHg), mod dilation of aortic root and ascending aorta (45 mm).  Chest CT 10/23: Ascending thoracic aorta 4.1 cm; aortic atherosclerosis   Past Surgical History:  Procedure Laterality Date   AORTIC VALVE REPLACEMENT  07/18/2011   Procedure: AORTIC VALVE REPLACEMENT (AVR);  Surgeon: Delight Ovens, MD;  Location: [facility] OR;  Service: Open Heart Surgery;  Laterality: N/A;   CARDIAC  CATHETERIZATION  2009, 07/11/11   Dr. Eldridge Dace   CORONARY ARTERY BYPASS GRAFT  07/18/2011   Procedure: CORONARY ARTERY BYPASS GRAFTING (CABG);  Surgeon: Delight Ovens, MD;  Location: [facility] OR;  Service: Open Heart Surgery;  Laterality: N/A;  times one to mammary artery, left   fem stent Right 2017   FEMORAL ARTERY - POPLITEAL ARTERY BYPASS GRAFT     KNEE ARTHROSCOPY     left   MRI     to visualize aortic valve   PERIPHERAL VASCULAR CATHETERIZATION N/A 07/12/2016   Procedure: Abdominal Aortogram w/Lower Extremity;  Surgeon: Maeola Harman, MD;  Location: [facility] INVASIVE CV LAB;  Service: Cardiovascular;  Laterality: N/A;   PERIPHERAL VASCULAR CATHETERIZATION Right 07/12/2016   Procedure: Peripheral Vascular Atherectomy;  Surgeon: Maeola Harman, MD;  Location: [facility] INVASIVE CV LAB;  Service: Cardiovascular;  Laterality: Right;  Anterior Tibial and Popliteal   ROTATOR CUFF REPAIR     Left   US ECHOCARDIOGRAPHY     Patient Active Problem List   Diagnosis Date Noted   Hypercoagulable state due to paroxysmal atrial fibrillation (HCC) 04/20/2023   Thoracic aortic aneurysm (HCC) 05/09/2022   Abnormal MRI, neck 11/16/2021   Rotator cuff arthropathy of left shoulder 08/17/2021   Chronic left shoulder pain 08/17/2021   Chronic frontal sinusitis 07/07/2021   Facial paresthesia 07/07/2021   Intervertebral disc  disorder of cervical region with myelopathy 07/07/2021   Cervical stenosis of spinal canal 06/07/2021   Meningeal irritation 06/07/2021   Idiopathic intracranial hypotension 06/07/2021   Cervicalgia 03/07/2021   Chronic daily headache 03/07/2021   Migraine 01/10/2021   Obstructive sleep apnea 01/10/2021   Renal stones 10/04/2020   Type 2 diabetes mellitus with unspecified complications (HCC) 10/04/2020   Benign prostatic hyperplasia without lower urinary tract symptoms 09/30/2020   GERD (gastroesophageal reflux disease) 05/05/2020   Irritable bowel syndrome 02/19/2020    Dependence on other enabling machines and devices 02/19/2020   ED (erectile dysfunction) of organic origin 02/19/2020   Major depression single episode, in partial remission (HCC) 02/19/2020   Obesity 02/19/2020   Chronic right-sided low back pain without sciatica 12/16/2019   Primary osteoarthritis of left knee 09/16/2019   Rotator cuff tear arthropathy of left shoulder 09/16/2019   Primary osteoarthritis of right knee 09/16/2019   Complex tear of medial meniscus of right knee as current injury 03/04/2019   Chronic diastolic heart failure (HCC) 11/25/2018   Bruxism (teeth grinding) 11/25/2018   Cephalalgia 11/25/2018   Cerebrovascular disease 11/25/2018   Morning headache 11/25/2018   Sleep related headaches 09/26/2018   Chronic idiopathic gout without tophus 02/13/2018   Hypertension    History of adenomatous polyp of colon 03/14/2016   Thrombosed hemorrhoids 03/01/2016   Encounter for long-term (current) use of high-risk medication 06/30/2015   Psoriasis 12/25/2014   Major depressive disorder, recurrent, in full remission (HCC) 06/22/2014   Gout 04/21/2014   Chest pain, somewhat atypical 12/28/2013   Dyspnea on exertion 12/27/2011   Dizziness 08/15/2011   Heart valve replaced by other means 07/28/2011   Paroxysmal atrial fibrillation (HCC) 07/28/2011   Aortic stenosis, severe 07/18/2011   Coronary artery disease 07/06/2011   Peripheral vascular disease (HCC) 07/06/2011   Hyperlipidemia 07/06/2011   Bicuspid aortic valve 07/06/2011    ONSET DATE: 04/17/2023  REFERRING DIAG: V25.36 (ICD-10-CM) - Personal history of transient ischemic attack (TIA), and cerebral infarction without residual deficits R26.81 (ICD-10-CM) - Unsteadiness on feet  THERAPY DIAG:  No diagnosis found.  Rationale for Evaluation and Treatment: Rehabilitation  SUBJECTIVE:                                                                                                                                                                                              SUBJECTIVE STATEMENT: *** Pt accompanied by: {accompnied:27141}  PERTINENT HISTORY: GIONNY HEIDORN is a 72 y.o. male with PMH of coronary artery disease (s/p CABG), hypertension, hyperlipidemia, type 2 diabetes mellitus, peripheral vascular disease, obstructive sleep apnea, known thoracic aortic  aneurysm, and depression, who presented to the emergency department at [facility] on 03/28/2023 with left sided weakness, left hemisensory deficit, and left facial droop. [name] He was altered at the scene and witnesses noted possible seizure-like activity.   Head CT revealed no acute intracranial abnormality. The patient did not receive IV TNK (tenecteplase) due to unclear last known well . CTA head and neck showed a distal right M1 occlusion versus proximal M2 occlusion, versus high grade stenosis on the right. MRI showed tiny right MCA territory strokes. He was noted to have ~60% carotid stenosis in the right ICA with calcified plaques. The etiology of his strokes were considered cryptogenic versus due to artery-to-artery embolus from calcified plaque coming from the right ICA.   Date of Admission-Discharge: August 28-April 02, 2023     PAIN:  Are you having pain? {OPRCPAIN:27236}  PRECAUTIONS: {Therapy precautions:24002}  RED FLAGS: {PT Red Flags:29287}   WEIGHT BEARING RESTRICTIONS: {Yes ***/No:24003}  FALLS: Has patient fallen in last 6 months? {fallsyesno:27318}  LIVING ENVIRONMENT: Lives with: {OPRC lives with:25569::"lives with their family"} Lives in: {Lives in:25570} Stairs: {opstairs:27293} Has following equipment at home: {Assistive devices:23999}  PLOF: {PLOF:24004}  PATIENT GOALS: ***  OBJECTIVE:  Note: Objective measures were completed at Evaluation unless otherwise noted.  DIAGNOSTIC FINDINGS: ***  COGNITION: Overall cognitive status:  {cognition:24006}   SENSATION: {sensation:27233}  COORDINATION: ***  EDEMA:  {edema:24020}  MUSCLE TONE: {LE tone:25568}  MUSCLE LENGTH: Hamstrings: Right *** deg; Left *** deg Maisie Fus test: Right *** deg; Left *** deg  DTRs:  {DTR SITE:24025}  POSTURE: {posture:25561}  LOWER EXTREMITY ROM:     {AROM/PROM:27142}  Right Eval Left Eval  Hip flexion    Hip extension    Hip abduction    Hip adduction    Hip internal rotation    Hip external rotation    Knee flexion    Knee extension    Ankle dorsiflexion    Ankle plantarflexion    Ankle inversion    Ankle eversion     (Blank rows = not tested)  LOWER EXTREMITY MMT:    MMT Right Eval Left Eval  Hip flexion    Hip extension    Hip abduction    Hip adduction    Hip internal rotation    Hip external rotation    Knee flexion    Knee extension    Ankle dorsiflexion    Ankle plantarflexion    Ankle inversion    Ankle eversion    (Blank rows = not tested)  BED MOBILITY:  {Bed mobility:24027}  TRANSFERS: Assistive device utilized: {Assistive devices:23999}  Sit to stand: {Levels of assistance:24026} Stand to sit: {Levels of assistance:24026} Chair to chair: {Levels of assistance:24026} Floor: {Levels of assistance:24026}  RAMP:  Level of Assistance: {Levels of assistance:24026} Assistive device utilized: {Assistive devices:23999} Ramp Comments: ***  CURB:  Level of Assistance: {Levels of assistance:24026} Assistive device utilized: {Assistive devices:23999} Curb Comments: ***  STAIRS: Level of Assistance: {Levels of assistance:24026} Stair Negotiation Technique: {Stair Technique:27161} with {Rail Assistance:27162} Number of Stairs: ***  Height of Stairs: ***  Comments: ***  GAIT: Gait pattern: {gait characteristics:25376} Distance walked: *** Assistive device utilized: {Assistive devices:23999} Level of assistance: {Levels of assistance:24026} Comments: ***  FUNCTIONAL TESTS:   {Functional tests:24029}  PATIENT SURVEYS:  {rehab surveys:24030}  TODAY'S TREATMENT:  DATE: ***    PATIENT EDUCATION: Education details: *** Person educated: {Person educated:25204} Education method: {Education Method:25205} Education comprehension: {Education Comprehension:25206}  HOME EXERCISE PROGRAM: ***  GOALS: Goals reviewed with patient? {yes/no:20286}  SHORT TERM GOALS: Target date: ***  *** Baseline: Goal status: INITIAL  2.  *** Baseline:  Goal status: INITIAL  3.  *** Baseline:  Goal status: INITIAL  4.  *** Baseline:  Goal status: INITIAL  5.  *** Baseline:  Goal status: INITIAL  6.  *** Baseline:  Goal status: INITIAL  LONG TERM GOALS: Target date: ***  *** Baseline:  Goal status: INITIAL  2.  *** Baseline:  Goal status: INITIAL  3.  *** Baseline:  Goal status: INITIAL  4.  *** Baseline:  Goal status: INITIAL  5.  *** Baseline:  Goal status: INITIAL  6.  *** Baseline:  Goal status: INITIAL  ASSESSMENT:  CLINICAL IMPRESSION: Patient is a *** y.o. *** who was seen today for physical therapy evaluation and treatment for ***.   OBJECTIVE IMPAIRMENTS: {opptimpairments:25111}.   ACTIVITY LIMITATIONS: {activitylimitations:27494}  PARTICIPATION LIMITATIONS: {participationrestrictions:25113}  PERSONAL FACTORS: {Personal factors:25162} are also affecting patient's functional outcome.   REHAB POTENTIAL: {rehabpotential:25112}  CLINICAL DECISION MAKING: {clinical decision making:25114}  EVALUATION COMPLEXITY: {Evaluation complexity:25115}  PLAN:  PT FREQUENCY: {rehab frequency:25116}  PT DURATION: {rehab duration:25117}  PLANNED INTERVENTIONS: {rehab planned interventions:25118::"Therapeutic exercises","Therapeutic activity","Neuromuscular re-education","Balance training","Gait  training","Patient/Family education","Self Care","Joint mobilization"}  PLAN FOR NEXT SESSION: ***   Drake Leach, PT, DPT 05/02/2023, 2:20 PM

## 2023-05-03 ENCOUNTER — Encounter: Payer: Self-pay | Admitting: Physical Therapy

## 2023-05-03 ENCOUNTER — Ambulatory Visit: Payer: BC Managed Care – PPO | Attending: Family Medicine | Admitting: Physical Therapy

## 2023-05-03 VITALS — BP 110/69 | HR 82

## 2023-05-03 DIAGNOSIS — R29818 Other symptoms and signs involving the nervous system: Secondary | ICD-10-CM | POA: Diagnosis not present

## 2023-05-07 ENCOUNTER — Telehealth: Payer: Self-pay | Admitting: *Deleted

## 2023-05-07 ENCOUNTER — Ambulatory Visit (HOSPITAL_BASED_OUTPATIENT_CLINIC_OR_DEPARTMENT_OTHER)
Admission: RE | Admit: 2023-05-07 | Discharge: 2023-05-07 | Disposition: A | Discharge status: Home / Self Care | Payer: BC Managed Care – PPO | Source: Ambulatory Visit | Attending: Physician Assistant | Admitting: Physician Assistant

## 2023-05-07 DIAGNOSIS — I2510 Atherosclerotic heart disease of native coronary artery without angina pectoris: Secondary | ICD-10-CM | POA: Diagnosis not present

## 2023-05-07 DIAGNOSIS — I517 Cardiomegaly: Secondary | ICD-10-CM | POA: Diagnosis not present

## 2023-05-07 DIAGNOSIS — Q2544 Congenital dilation of aorta: Secondary | ICD-10-CM | POA: Insufficient documentation

## 2023-05-07 DIAGNOSIS — Z951 Presence of aortocoronary bypass graft: Secondary | ICD-10-CM | POA: Diagnosis not present

## 2023-05-07 DIAGNOSIS — Z79899 Other long term (current) drug therapy: Secondary | ICD-10-CM

## 2023-05-07 DIAGNOSIS — Q2546 Tortuous aortic arch: Secondary | ICD-10-CM | POA: Diagnosis not present

## 2023-05-07 LAB — POCT I-STAT CREATININE: Creatinine, Ser: 1.3 mg/dL — ABNORMAL HIGH (ref 0.61–1.24)

## 2023-05-07 MED ORDER — IOHEXOL 350 MG/ML SOLN
100.0000 mL | Freq: Once | INTRAVENOUS | Status: AC | PRN
  Administered 2023-05-07: 75 mL via INTRAVENOUS

## 2023-05-07 NOTE — Telephone Encounter (Signed)
-----   Message from Tereso Newcomer sent at 05/07/2023  8:38 AM EDT ----- Results sent to [facility] via MyChart. See MyChart comments below: PLAN: -Repeat BMET in 1 month

## 2023-05-09 ENCOUNTER — Ambulatory Visit: Payer: BC Managed Care – PPO | Admitting: Occupational Therapy

## 2023-05-09 ENCOUNTER — Other Ambulatory Visit: Payer: Self-pay

## 2023-05-09 ENCOUNTER — Encounter: Payer: Self-pay | Admitting: Occupational Therapy

## 2023-05-09 DIAGNOSIS — R29818 Other symptoms and signs involving the nervous system: Secondary | ICD-10-CM

## 2023-05-09 NOTE — Therapy (Addendum)
OUTPATIENT OCCUPATIONAL THERAPY NEURO EVALUATION  Patient Name: [name] MRN: [id number] DOB:1951-04-24, 72 y.o., male Today's Date: 05/09/2023  PCP: Aliene Beams, MD  REFERRING PROVIDER: Aliene Beams, MD  END OF SESSION:  OT End of Session - 05/09/23 0855     Visit Number 1    Number of Visits 1    Date for OT Re-Evaluation 05/09/23    Authorization Type BC[employer], visit limit 30    OT Start Time 0855    OT Stop Time 0940    OT Time Calculation (min) 45 min    Activity Tolerance Patient tolerated treatment well    Behavior During Therapy [facility] for tasks assessed/performed             Past Medical History:  Diagnosis Date   Aortic stenosis    a. severe bicuspid AV stenosis s/p pericardial tissue valve 2012.   Arthritis    Coronary artery disease    a. s/p CABG (LIMA-LCx) at time of AVR 2012, mild nonobstructive LAD and RCA stenoses. // Myoview 03/2022: EF 48, no ischemia; low risk   Depression    Depression    Diabetes mellitus without complication (HCC)    ED (erectile dysfunction)    Encounter for long-term (current) use of high-risk medication 06/30/2015   Fatty liver    Femoral-popliteal bypass graft occlusion, right [facility]) 1997   Dr. 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   Obesity    Obstructive sleep apnea 01/10/2021   PAD (peripheral artery disease) (HCC)    PONV (postoperative nausea and vomiting)    PVD (peripheral vascular disease) (HCC)    Thoracic aortic aneurysm (HCC) 05/09/2022   Echocardiogram 10/23: EF 55-60, no RWMA, mild LVH, normal RVSF, mild MR, s/p AVR with mild AI and normal gradient (15 mmHg), mod  dilation of aortic root and ascending aorta (45 mm).  Chest CT 10/23: Ascending thoracic aorta 4.1 cm; aortic atherosclerosis   Past Surgical History:  Procedure Laterality Date   AORTIC VALVE REPLACEMENT  07/18/2011   Procedure: AORTIC VALVE REPLACEMENT (AVR);  Surgeon: Delight Ovens, MD;  Location: [facility] OR;  Service: Open Heart Surgery;  Laterality: N/A;   CARDIAC CATHETERIZATION  2009, 07/11/11   Dr. Eldridge Dace   CORONARY ARTERY BYPASS GRAFT  07/18/2011   Procedure: CORONARY ARTERY BYPASS GRAFTING (CABG);  Surgeon: Delight Ovens, MD;  Location: [facility] OR;  Service: Open Heart Surgery;  Laterality: N/A;  times one to mammary artery, left   fem stent Right 2017   FEMORAL ARTERY - POPLITEAL ARTERY BYPASS GRAFT     KNEE ARTHROSCOPY     left   MRI     to visualize aortic valve   PERIPHERAL VASCULAR CATHETERIZATION N/A 07/12/2016   Procedure: Abdominal Aortogram w/Lower Extremity;  Surgeon: Maeola Harman, MD;  Location: [facility] INVASIVE CV LAB;  Service: Cardiovascular;  Laterality: N/A;   PERIPHERAL VASCULAR CATHETERIZATION Right 07/12/2016   Procedure: Peripheral Vascular Atherectomy;  Surgeon: Maeola Harman, MD;  Location: [facility] INVASIVE CV LAB;  Service: Cardiovascular;  Laterality: Right;  Anterior Tibial and Popliteal   ROTATOR CUFF REPAIR     Left   US ECHOCARDIOGRAPHY     Patient Active Problem List   Diagnosis Date Noted   Hypercoagulable state due to paroxysmal atrial fibrillation (HCC) 04/20/2023   Thoracic aortic aneurysm (HCC) 05/09/2022   Abnormal MRI, neck 11/16/2021   Rotator cuff arthropathy of left shoulder 08/17/2021   Chronic left shoulder pain 08/17/2021   Chronic frontal sinusitis 07/07/2021   Facial paresthesia 07/07/2021   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 07/07/2021   Cervical stenosis of spinal canal 06/07/2021   Meningeal irritation 06/07/2021   Idiopathic intracranial hypotension 06/07/2021   Cervicalgia  03/07/2021   Chronic daily headache 03/07/2021   Migraine 01/10/2021   Obstructive sleep apnea 01/10/2021   Renal stones 10/04/2020   Type 2 diabetes mellitus with unspecified complications (HCC) 10/04/2020   Benign prostatic hyperplasia without lower urinary tract symptoms 09/30/2020   GERD (gastroesophageal reflux disease) 05/05/2020   Irritable bowel syndrome 02/19/2020   Dependence on other enabling machines and devices 02/19/2020   ED (erectile dysfunction) of organic origin 02/19/2020   Major depression single episode, in partial remission (HCC) 02/19/2020   Obesity 02/19/2020   Chronic right-sided low back pain without sciatica 12/16/2019   Primary osteoarthritis of left knee 09/16/2019   Rotator cuff tear arthropathy of left shoulder 09/16/2019   Primary osteoarthritis of right knee 09/16/2019   Complex tear of medial meniscus of right knee as current injury 03/04/2019   Chronic diastolic heart failure (HCC) 11/25/2018   Bruxism (teeth grinding) 11/25/2018   Cephalalgia 11/25/2018   Cerebrovascular disease 11/25/2018   Morning headache 11/25/2018   Sleep related headaches 09/26/2018   Chronic idiopathic gout without tophus 02/13/2018   Hypertension    History of adenomatous polyp of colon 03/14/2016   Thrombosed hemorrhoids 03/01/2016   Encounter for long-term (current) use of high-risk medication 06/30/2015   Psoriasis 12/25/2014   Major depressive disorder, recurrent, in full remission (HCC) 06/22/2014   Gout 04/21/2014   Chest pain, somewhat atypical 12/28/2013   Dyspnea on exertion 12/27/2011   Dizziness 08/15/2011   Heart valve replaced by other means 07/28/2011   Paroxysmal atrial fibrillation (HCC) 07/28/2011   Aortic stenosis, severe 07/18/2011   Coronary artery disease 07/06/2011   Peripheral vascular disease (HCC) 07/06/2011   Hyperlipidemia 07/06/2011   Bicuspid aortic valve 07/06/2011    ONSET DATE: 05/07/23 (referral date), 03/28/23 (date of  TIA)  REFERRING DIAG: Z86.73 (ICD-10-CM) - Personal history of transient ischemic attack (TIA), and cerebral infarction without residual deficits  THERAPY DIAG:  Other symptoms and signs involving the nervous system  Rationale for Evaluation and Treatment: Rehabilitation  SUBJECTIVE:   SUBJECTIVE STATEMENT: Pt reports he is no longer driving since the TIA. Pt reports to seek driving privileges back after an OT evaluation.  Pt accompanied by: self and significant other Britta Mccreedy)  PERTINENT HISTORY: coronary artery disease (s/p CABG), hypertension, hyperlipidemia, type 2 diabetes mellitus, peripheral vascular disease, obstructive sleep apnea, known thoracic aortic aneurysm, GERD, and depression, who presented to the emergency department at [facility] on 03/28/2023 with left sided weakness, left hemisensory deficit, and left facial droop. He was altered at the scene and witnesses noted possible seizure-like activity.    Head CT revealed no acute intracranial abnormality. The patient did not receive IV TNK (tenecteplase) due to unclear last known well . CTA head and neck showed a distal right M1 occlusion versus proximal  M2 occlusion, versus high grade stenosis on the right. MRI showed tiny right MCA territory strokes. He was noted to have ~60% carotid stenosis in the right ICA with calcified plaques. The etiology of his strokes were considered cryptogenic versus due to artery-to-artery embolus from calcified plaque coming from the right ICA.    Date of Admission-Discharge: August 28-April 02, 2023    Has subsequently been evaluated by neurology as an outpatient and has been initiated on Keppra due to possible seizure-like activity.  Got an EEG on 04/19/23, Interpretation: This is normal awake and drowsy EEG.  Pt with new on-set a-fib, now wearing a heart monitor   PRECAUTIONS: None  WEIGHT BEARING RESTRICTIONS: No  PAIN:  Are you having pain? No  FALLS: Has  patient fallen in last 6 months? No  LIVING ENVIRONMENT: Lives with: lives with their spouse Lives in: House/apartment Stairs: Yes: Internal: 8 steps; on right going up external 1 step car port into backdoor and 6 steps to front door (railing both sides) Has following equipment at home: None  PLOF: Independent. Working full-time as a Human resources officer. Pt is currently returned to working part-time and spouse drives pt to destinations.  PATIENT GOALS: To drive. Pt reports no deficits, including picking up things, writing, and doing "normal things" before the incident.  OBJECTIVE:  Note: Objective measures were completed at Evaluation unless otherwise noted.  HAND DOMINANCE: Left  ADLs: Overall ADLs: ind Transfers/ambulation related to ADLs: Eating: ind Grooming: ind UB Dressing: ind LB Dressing: ind Toileting: ind Bathing: ind Tub Shower transfers: ind Equipment: Walk in shower  IADLs: Shopping: ind Light housekeeping: ind Meal Prep: ind Community mobility: dependent Medication management: ind Landscape architect: ind Handwriting: 100% legible and Increased time Pt reports slightly increased time for handwriting since TIA but pt reports writing speed is not interfering with tasks.  Pt reports no difficulty with typing.  MOBILITY STATUS: Independent  POSTURE COMMENTS:  No Significant postural limitations Sitting balance:  ind  ACTIVITY TOLERANCE: Activity tolerance: Good and "getting better" per pt report.  FUNCTIONAL OUTCOME MEASURES: FOTO: Not captured  UPPER EXTREMITY ROM:    Active ROM Right eval Left eval  Shoulder flexion WFL 120*  Shoulder abduction [facility]  Shoulder adduction    Shoulder extension    Shoulder internal rotation    Shoulder external rotation    Elbow flexion    Elbow extension    Wrist flexion [facility]  Wrist extension [facility]  Wrist ulnar deviation [facility] WFL  Wrist radial deviation [facility] WFL  Wrist pronation [facility] WFL  Wrist  supination WFL WFL  (Blank rows = not tested)  Pt reports limited ROM of L shoulder d/t previous rotator cuff injury approx. 10 years ago. Pt reports current shoulder ROM today is the same as before the TIA event. Pt reports approx. 4/10 pain when lifting L arm to 120*.  UPPER EXTREMITY MMT:     MMT Right eval Left eval  Shoulder flexion 5 Not tested d/t previous rotator cuff injury  Shoulder abduction 5 Not tested d/t previous rotator cuff injury  Shoulder adduction    Shoulder extension    Shoulder internal rotation    Shoulder external rotation    Middle trapezius    Lower trapezius    Elbow flexion    Elbow extension    Wrist flexion 5 4+  Wrist extension 5 4+  Wrist ulnar deviation    Wrist radial deviation    Wrist pronation    Wrist  supination    (Blank rows = not tested)  HAND FUNCTION: Grip strength: Right: 56.2 lbs; Left: 61.5 lbs  COORDINATION: 9 Hole Peg test: Right: 31 sec; Left: 31 sec  SENSATION: Pt reports no change in sensation.  EDEMA: No edema noted.  MUSCLE TONE: None noted.  COGNITION: Overall cognitive status: Within functional limits for tasks assessed  VISION: Subjective report: Pt reports no changes in vision since TIA event. Baseline vision: Wears contacts Pt wears contact in R eye only and pt wears prescription glasses for both eyes at home. Visual history:  No significant visual history reported by pt . Pt reported contact and glasses are typical corrective lenses worn prior to TIA event.  VISION ASSESSMENT: WFL - Visual tracking: Eye movements appeared smooth in all 4 quadrants and moving diagonals.  Peripheral vision on R, L, and superior also appeared WNL.  Patient has difficulty with following activities due to following visual impairments: None noted.  Visual scanning Letter Cancellation task (75M font size). Pt found 100% of all letters within 3 visual scanning lines.  PERCEPTION: Not tested. No concerns observed.  PRAXIS:  Not tested  OBSERVATIONS: Pt arrived to OT eval with spouse. Pt appeared well-kept and participated in tasks well. Pt ambulated ind with no LOB noted and no A/E.  Pt ambulated around clinic identifying several red objects. Pt required x2 v/c to continue looking left though visual tracking for red objects in environment appeared WNL and pt immediately identified additional red objects following brief v/c. Pt navigated all obstacles in environment.    TODAY'S TREATMENT:                                                                                                                              DATE:   OT provided education regarding OT role, POC, and review of objective results. OT recommended pt to follow-up with physician and OT driving clinic evaluation PRN because OT at [facility] does not offer formal driving assessment at this clinic. Pt and pt's spouse verbalized understanding.  PATIENT EDUCATION: Education details: see above Person educated: Patient and Spouse Education method: Explanation Education comprehension: verbalized understanding  HOME EXERCISE PROGRAM: N/A   GOALS: N/A. Eval only  ASSESSMENT:  CLINICAL IMPRESSION: Patient is a 72 y.o. male who was seen today for occupational therapy evaluation for recent TIA event. Hx includes coronary artery disease (s/p CABG), hypertension, hyperlipidemia, type 2 diabetes mellitus, peripheral vascular disease, obstructive sleep apnea, known thoracic aortic aneurysm, GERD, and depression, who presented to the emergency department at [facility] on 03/28/2023 with left sided weakness, left hemisensory deficit, and left facial droop. He was altered at the scene and witnesses noted possible seizure-like activity. Patient currently presents near baseline level of functioning. OT noted no significant functional deficits, including for visual scanning WFL, ocular movements WFL, BUE ROM, strength, and  coordination WFL. Pt ind with most ADLs and IADLs though not currently driving as  pt reports he is in the process of receiving clearance for driving. No substantial concerns for driving identified during assessment though follow-up with physician and/or formal OT driving assessment recommended for any final decisions. Patient needs addressed at eval. No further need for skilled OT services at this time.    PERFORMANCE DEFICITS: No significant functional deficits identified.  IMPAIRMENTS:  Work d/t pt not yet driving secondary to pt's recent TIA event and pt currently awaiting clearance .   CO-MORBIDITIES: may have co-morbidities  that affects occupational performance.   MODIFICATION OR ASSISTANCE TO COMPLETE EVALUATION: No modification of tasks or assist necessary to complete an evaluation.  OT OCCUPATIONAL PROFILE AND HISTORY: Problem focused assessment: Including review of records relating to presenting problem.  CLINICAL DECISION MAKING: LOW - limited treatment options, no task modification necessary  REHAB POTENTIAL: Good  EVALUATION COMPLEXITY: Low    PLAN:  OT FREQUENCY/DURATION: one time visit - eval only   RECOMMENDED OTHER SERVICES: Follow up with physician regarding driving clearance  CONSULTED AND AGREED WITH PLAN OF CARE: Patient and family member/caregiver   Wynetta Emery, OT 05/09/2023, 10:05 AM

## 2023-05-14 ENCOUNTER — Telehealth: Payer: Self-pay | Admitting: *Deleted

## 2023-05-14 ENCOUNTER — Telehealth: Payer: Self-pay

## 2023-05-14 DIAGNOSIS — I77810 Thoracic aortic ectasia: Secondary | ICD-10-CM

## 2023-05-14 NOTE — Telephone Encounter (Signed)
-----   Message from Tereso Newcomer sent at 05/13/2023  8:49 PM EDT ----- Results sent to [facility] via MyChart. See MyChart comments below. PLAN:  -Repeat CCTA in 1 year (Dx: ascending aorta dilation) -I will send a copy to PCP Tracie Harrier, Fleet Contras, MD)  Mr. Lafoe  Your CT shows stable enlargement of the aorta (dilation of aorta). Continue current medications/treatment plan and follow up as scheduled. We will repeat your scan again in 1 year.  Tereso Newcomer, PA-C

## 2023-05-14 NOTE — Telephone Encounter (Signed)
-----   Message from Nurse Chrisanne Loose P sent at 05/12/2021 10:21 AM EDT ----- The cyst in your pancreas has not changed at all.  I recommend repeat MRI of the pancreas in 2 years

## 2023-05-14 NOTE — Telephone Encounter (Signed)
Left message on machine to call back regarding MR Abdomen W Wo Contrast

## 2023-05-15 NOTE — Telephone Encounter (Signed)
Left message on machine to call back  

## 2023-05-16 NOTE — Telephone Encounter (Signed)
Left message on machine to call back  

## 2023-05-17 ENCOUNTER — Other Ambulatory Visit: Payer: Self-pay

## 2023-05-17 DIAGNOSIS — K869 Disease of pancreas, unspecified: Secondary | ICD-10-CM

## 2023-05-17 NOTE — Telephone Encounter (Signed)
Unable to reach the pt via phone letter mailed.

## 2023-05-17 NOTE — Telephone Encounter (Signed)
The pt returned call and agrees to MRI- order entered and sent to the schedulers

## 2023-05-18 ENCOUNTER — Encounter (HOSPITAL_COMMUNITY): Payer: Self-pay | Admitting: Internal Medicine

## 2023-05-18 ENCOUNTER — Ambulatory Visit (HOSPITAL_COMMUNITY)
Admission: RE | Admit: 2023-05-18 | Discharge: 2023-05-18 | Disposition: A | Discharge status: Home / Self Care | Payer: BC Managed Care – PPO | Source: Ambulatory Visit | Attending: Internal Medicine | Admitting: Internal Medicine

## 2023-05-18 VITALS — BP 108/90 | HR 95 | Ht 71.0 in | Wt 198.8 lb

## 2023-05-18 DIAGNOSIS — Z7901 Long term (current) use of anticoagulants: Secondary | ICD-10-CM | POA: Diagnosis not present

## 2023-05-18 DIAGNOSIS — I480 Paroxysmal atrial fibrillation: Secondary | ICD-10-CM | POA: Diagnosis not present

## 2023-05-18 DIAGNOSIS — I10 Essential (primary) hypertension: Secondary | ICD-10-CM | POA: Diagnosis not present

## 2023-05-18 DIAGNOSIS — Z79899 Other long term (current) drug therapy: Secondary | ICD-10-CM | POA: Insufficient documentation

## 2023-05-18 DIAGNOSIS — E785 Hyperlipidemia, unspecified: Secondary | ICD-10-CM | POA: Diagnosis not present

## 2023-05-18 DIAGNOSIS — D6869 Other thrombophilia: Secondary | ICD-10-CM | POA: Insufficient documentation

## 2023-05-18 DIAGNOSIS — E119 Type 2 diabetes mellitus without complications: Secondary | ICD-10-CM | POA: Diagnosis not present

## 2023-05-18 DIAGNOSIS — I739 Peripheral vascular disease, unspecified: Secondary | ICD-10-CM | POA: Diagnosis not present

## 2023-05-18 LAB — CBC
HCT: 47.6 % (ref 39.0–52.0)
Hemoglobin: 15.3 g/dL (ref 13.0–17.0)
MCH: 31.2 pg (ref 26.0–34.0)
MCHC: 32.1 g/dL (ref 30.0–36.0)
MCV: 97.1 fL (ref 80.0–100.0)
Platelets: 159 10*3/uL (ref 150–400)
RBC: 4.9 MIL/uL (ref 4.22–5.81)
RDW: 13 % (ref 11.5–15.5)
WBC: 6.3 10*3/uL (ref 4.0–10.5)
nRBC: 0 % (ref 0.0–0.2)

## 2023-05-18 NOTE — Progress Notes (Signed)
Primary Care Physician: Aliene Beams, MD Primary Cardiologist: [name], MD Electrophysiologist: None     Referring Physician: Dr. Sidney Ace is a 72 y.o. male with a history of T2DM, HTN, HLD, PAD with history of femoral-popliteal bypass graft occlusion, GERD, OSA on CPAP, bicuspid aortic valve, aortic stenosis s/p valve replacement 2012, CAD s/p CABG 2012, CVA 8/24, and paroxysmal atrial fibrillation who presents for consultation in the [facility]. Seen by Dr. Excell Seltzer on 9/5 and with recent CVA placed 30-day monitor; alert for new onset Afib with RVR. Discontinued plavix. Patient is on Eliquis for a CHADS2VASC score of 6.  On evaluation today, he is currently in NSR. He has no bleeding issues on Eliquis. He has no cardiac awareness of arrhythmia; he was out walking when he received the phone call noting he was in Afib with RVR. He is currently still wearing the 30-day monitor.   On follow up 05/18/23, he is currently in NSR. He has had no episodes of Afib since last OV. Event monitor worn showed <1% Afib burden. He feels well overall and walks 2 miles daily. No bleeding issues on Eliquis.   Today, he denies symptoms of palpitations, chest pain, shortness of breath, orthopnea, PND, lower extremity edema, dizziness, presyncope, syncope, snoring, daytime somnolence, bleeding, or neurologic sequela. The patient is tolerating medications without difficulties and is otherwise without complaint today.    Atrial Fibrillation Risk Factors:  he does have symptoms or diagnosis of sleep apnea. he is compliant with CPAP therapy.  he has a BMI of Body mass index is 27.73 kg/m.[name] Filed Weights   05/18/23 0844  Weight: 90.2 kg     Current Outpatient Medications  Medication Sig Dispense Refill   Adalimumab 40 MG/0.8ML PNKT Inject 40 mg into the skin every 14 (fourteen) days.     amoxicillin (AMOXIL) 500 MG capsule TAKE 4 CAPSULES ONE HOUR  BEFORE DENTAL APPOINTMENT. 8 capsule 0   apixaban (ELIQUIS) 5 MG TABS tablet Take 1 tablet (5 mg total) by mouth 2 (two) times daily. 60 tablet 5   atorvastatin (LIPITOR) 80 MG tablet Take 1 tablet (80 mg total) by mouth daily. 90 tablet 0   Cholecalciferol (VITAMIN D-3 PO) Take 1 tablet by mouth daily.     citalopram (CELEXA) 20 MG tablet Take 20 mg by mouth daily.     Cyanocobalamin (VITAMIN B-12 PO) Take 1 tablet by mouth daily.     dapagliflozin propanediol (FARXIGA) 5 MG TABS tablet Take 5 mg by mouth daily.     enalapril (VASOTEC) 2.5 MG tablet Take 2.5 mg by mouth daily.     ezetimibe (ZETIA) 10 MG tablet Take 10 mg by mouth daily.     fluticasone (FLONASE) 50 MCG/ACT nasal spray Place 2 sprays into both nostrils daily.     furosemide (LASIX) 20 MG tablet TAKE ONE TABLET BY MOUTH DAILY 90 tablet 0   lansoprazole (PREVACID) 15 MG capsule Take 15 mg by mouth daily as needed. For acid reflux     levETIRAcetam (KEPPRA) 500 MG tablet Take 500 mg by mouth 2 (two) times daily.     metoprolol tartrate (LOPRESSOR) 50 MG tablet TAKE (1/2) TABLET TWICE DAILY. 90 tablet 0   tamsulosin (FLOMAX) 0.4 MG CAPS capsule Take 0.4 mg by mouth daily.      No current facility-administered medications for this encounter.    Atrial Fibrillation Management history:  Previous antiarrhythmic drugs: None Previous cardioversions:  None Previous ablations: None Anticoagulation history: Eliquis   ROS- All systems are reviewed and negative except as per the HPI above.  Physical Exam: BP (!) 108/90   Pulse 95   Ht 5\' 11"  (1.803 m)   Wt 90.2 kg   BMI 27.73 kg/m   GEN- The patient is well appearing, alert and oriented x 3 today.   Neck - no JVD or carotid bruit noted Lungs- Clear to ausculation bilaterally, normal work of breathing Heart- Regular rate and rhythm, no murmurs, rubs or gallops, PMI not laterally displaced Extremities- no clubbing, cyanosis, or edema Skin - no rash or ecchymosis  noted   EKG today demonstrates  Vent. rate 95 BPM PR interval 204 ms QRS duration 90 ms QT/QTcB 366/459 ms P-R-T axes 67 40 151 Sinus rhythm with marked sinus arrhythmia with occasional Premature ventricular complexes Cannot rule out Anterior infarct , age undetermined Abnormal ECG When compared with ECG of 20-Apr-2023 08:45, PREVIOUS ECG IS PRESENT  Echo 05/05/22 demonstrated   1. S/P AVR with mild AI and normal mean gradient of 15 mmHg).   2. Left ventricular ejection fraction, by estimation, is 55 to 60%. The  left ventricle has normal function. The left ventricle has no regional  wall motion abnormalities. There is mild concentric left ventricular  hypertrophy. Left ventricular diastolic  parameters are indeterminate.   3. Right ventricular systolic function is normal. The right ventricular  size is normal. Tricuspid regurgitation signal is inadequate for assessing  PA pressure.   4. The mitral valve is normal in structure. Mild mitral valve  regurgitation. No evidence of mitral stenosis.   5. The aortic valve has been repaired/replaced. Aortic valve  regurgitation is mild. No aortic stenosis is present. There is a 23 mm  Edwards bioprosthetic valve present in the aortic position.   6. Aortic dilatation noted. There is moderate dilatation of the aortic  root and of the ascending aorta, measuring 45 mm.   7. The inferior vena cava is normal in size with greater than 50%  respiratory variability, suggesting right atrial pressure of 3 mmHg.   ASSESSMENT & PLAN CHA2DS2-VASc Score = 6  The patient's score is based upon: CHF History: 0 HTN History: 1 Diabetes History: 1 Stroke History: 2 Vascular Disease History: 1 Age Score: 1 Gender Score: 0      ASSESSMENT AND PLAN: Paroxysmal Atrial Fibrillation (ICD10:  I48.0) The patient's CHA2DS2-VASc score is 6, indicating a 9.7% annual risk of stroke.    He is currently in NSR. Event monitor showed <1% Afib burden which is  reassuring. After discussion, we will proceed with conservative observation at this time. Rhythm monitoring device recommended again. Continue Lopressor 12.5 mg BID.   Secondary Hypercoagulable State (ICD10:  D68.69) The patient is at significant risk for stroke/thromboembolism based upon his CHA2DS2-VASc Score of 6.  Continue Apixaban (Eliquis).  Discussion of necessity for anticoagulation to prevent stroke secondary to atrial fibrillation. Patient will continue Eliquis.    Follow up 6 months Afib clinic.    Lake Bells, PA-C  Afib Clinic [facility] [address] [place], [place] [address] [phone]

## 2023-05-21 DIAGNOSIS — G4733 Obstructive sleep apnea (adult) (pediatric): Secondary | ICD-10-CM | POA: Diagnosis not present

## 2023-06-01 ENCOUNTER — Other Ambulatory Visit: Payer: Self-pay | Admitting: Cardiovascular Disease

## 2023-06-01 DIAGNOSIS — E782 Mixed hyperlipidemia: Secondary | ICD-10-CM

## 2023-06-04 ENCOUNTER — Ambulatory Visit: Payer: BC Managed Care – PPO | Attending: Physician Assistant

## 2023-06-04 ENCOUNTER — Other Ambulatory Visit: Payer: Self-pay | Admitting: *Deleted

## 2023-06-04 DIAGNOSIS — Z79899 Other long term (current) drug therapy: Secondary | ICD-10-CM

## 2023-06-06 ENCOUNTER — Other Ambulatory Visit: Payer: Self-pay | Admitting: Gastroenterology

## 2023-06-06 ENCOUNTER — Ambulatory Visit (HOSPITAL_COMMUNITY)
Admission: RE | Admit: 2023-06-06 | Discharge: 2023-06-06 | Disposition: A | Discharge status: Home / Self Care | Payer: BC Managed Care – PPO | Source: Ambulatory Visit | Attending: Gastroenterology | Admitting: Gastroenterology

## 2023-06-06 DIAGNOSIS — K869 Disease of pancreas, unspecified: Secondary | ICD-10-CM | POA: Diagnosis not present

## 2023-06-06 DIAGNOSIS — N281 Cyst of kidney, acquired: Secondary | ICD-10-CM | POA: Diagnosis not present

## 2023-06-06 DIAGNOSIS — K8689 Other specified diseases of pancreas: Secondary | ICD-10-CM | POA: Diagnosis not present

## 2023-06-06 MED ORDER — GADOBUTROL 1 MMOL/ML IV SOLN
9.0000 mL | Freq: Once | INTRAVENOUS | Status: AC | PRN
  Administered 2023-06-06: 9 mL via INTRAVENOUS

## 2023-06-10 NOTE — Progress Notes (Unsigned)
Office Visit Note  Patient: [name]             Date of Birth: 07/14/1951           MRN: [id number]             PCP: Aliene Beams, MD Referring: Aliene Beams, MD Visit Date: 06/11/2023 Occupation: Retired, Airline pilot  Subjective:  New Patient (Initial Visit) (Patient states he had a stroke in August. Patient states he has a trigger finger on right hand index finger and left hand ring finger. Patient states he used to be on Humira for psoriatic arthritis and has been off of it since July due to changing providers and would like to know if he should be back on it. Patient states he has pain in his left shoulder and knees. Patient states he has has surgery on his left shoulder and surgery on the left knee. )   History of Present Illness: [name] is a 72 y.o. male here to establish care for psoriatic arthritis.  His medical history significant for CAD, stroke, aortic valve replacement, and paroxysmal A-fib on long-term anticoagulation. Also has chronic osteoarthritis, gout, and injury of multiple joints with previous arthroscopic repair of left shoulder rotator cuff and left knee meniscus.  He sees orthopedic surgery for this with viscosupplementation injections for his knees by Dr. Roda Shutters and shoulder with Dr. Alvester Morin.  He is transferring care from Dr. Mariah Milling in [place] to reduce his commutes. Psoriasis started since his teenage years with chronic longstanding symptoms.  Describes psoriatic plaques involving his scalp, arms, and bilateral knees treated primarily with topical steroids over time.  He was never on systemic treatment until starting Humira which was about 6 years ago.  At the time increase in joint pain and stiffness in multiple areas and extent of skin rashes.  He had occasional joint swelling but more often pain and stiffness without much visible change.  He was also receiving steroid injection for trigger finger involving the right index finger and left ring finger.   He came off the Humira since 3 to 4 months ago due to transitioning care so far he has not noticed any appreciable change in symptoms.  His shoulder pain and knee pains are still present but feels manageable with his current injections about every 6 months and takes Tylenol as needed.   Activities of Daily Living:  Patient reports morning stiffness for 10 minutes.   Patient Reports nocturnal pain.  Difficulty dressing/grooming: Denies Difficulty climbing stairs: Denies Difficulty getting out of chair: Denies Difficulty using hands for taps, buttons, cutlery, and/or writing: Reports  Review of Systems  Constitutional:  Positive for fatigue.  HENT:  Positive for mouth dryness. Negative for mouth sores.   Eyes:  Negative for dryness.  Respiratory:  Negative for shortness of breath.   Cardiovascular:  Negative for chest pain and palpitations.  Gastrointestinal:  Positive for constipation and diarrhea. Negative for blood in stool.  Endocrine: Positive for increased urination.  Genitourinary:  Negative for involuntary urination.  Musculoskeletal:  Positive for joint pain, gait problem, joint pain, joint swelling, myalgias, muscle weakness, morning stiffness, muscle tenderness and myalgias.  Skin:  Negative for color change, rash, hair loss and sensitivity to sunlight.  Allergic/Immunologic: Negative for susceptible to infections.  Neurological:  Positive for headaches. Negative for dizziness.  Hematological:  Negative for swollen glands.  Psychiatric/Behavioral:  Negative for depressed mood and sleep disturbance. The patient is not nervous/anxious.  PMFS History:  Patient Active Problem List   Diagnosis Date Noted   Psoriatic arthritis (HCC) 06/11/2023   Trigger finger, left ring finger 06/11/2023   Trigger finger, right index finger 06/11/2023   Hypercoagulable state due to paroxysmal atrial fibrillation (HCC) 04/20/2023   Thoracic aortic aneurysm (HCC) 05/09/2022   Abnormal MRI,  neck 11/16/2021   Rotator cuff arthropathy of left shoulder 08/17/2021   Chronic left shoulder pain 08/17/2021   Chronic frontal sinusitis 07/07/2021   Facial paresthesia 07/07/2021   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 07/07/2021   Cervical stenosis of spinal canal 06/07/2021   Meningeal irritation 06/07/2021   Idiopathic intracranial hypotension 06/07/2021   Cervicalgia 03/07/2021   Chronic daily headache 03/07/2021   Migraine 01/10/2021   Obstructive sleep apnea 01/10/2021   Renal stones 10/04/2020   Type 2 diabetes mellitus with unspecified complications (HCC) 10/04/2020   Benign prostatic hyperplasia without lower urinary tract symptoms 09/30/2020   GERD (gastroesophageal reflux disease) 05/05/2020   Irritable bowel syndrome 02/19/2020   Dependence on other enabling machines and devices 02/19/2020   ED (erectile dysfunction) of organic origin 02/19/2020   Major depression single episode, in partial remission (HCC) 02/19/2020   Obesity 02/19/2020   Chronic right-sided low back pain without sciatica 12/16/2019   Primary osteoarthritis of left knee 09/16/2019   Rotator cuff tear arthropathy of left shoulder 09/16/2019   Primary osteoarthritis of right knee 09/16/2019   Complex tear of medial meniscus of right knee as current injury 03/04/2019   Chronic diastolic heart failure (HCC) 11/25/2018   Bruxism (teeth grinding) 11/25/2018   Cephalalgia 11/25/2018   Cerebrovascular disease 11/25/2018   Morning headache 11/25/2018   Sleep related headaches 09/26/2018   Chronic idiopathic gout without tophus 02/13/2018   Hypertension    History of adenomatous polyp of colon 03/14/2016   Thrombosed hemorrhoids 03/01/2016   Encounter for long-term (current) use of high-risk medication 06/30/2015   Psoriasis 12/25/2014   Major depressive disorder, recurrent, in full remission (HCC) 06/22/2014   Gout 04/21/2014   Chest pain, somewhat atypical 12/28/2013    Dyspnea on exertion 12/27/2011   Dizziness 08/15/2011   Heart valve replaced by other means 07/28/2011   Paroxysmal atrial fibrillation (HCC) 07/28/2011   Aortic stenosis, severe 07/18/2011   Coronary artery disease 07/06/2011   Peripheral vascular disease (HCC) 07/06/2011   Hyperlipidemia 07/06/2011   Bicuspid aortic valve 07/06/2011    Past Medical History:  Diagnosis Date   Aortic stenosis    a. severe bicuspid AV stenosis s/p pericardial tissue valve 2012.   Arthritis    Coronary artery disease    a. s/p CABG (LIMA-LCx) at time of AVR 2012, mild nonobstructive LAD and RCA stenoses. // Myoview 03/2022: EF 48, no ischemia; low risk   Depression    Depression    Diabetes mellitus without complication (HCC)    ED (erectile dysfunction)    Encounter for long-term (current) use of high-risk medication 06/30/2015   Fatty liver    Femoral-popliteal bypass graft occlusion, right [facility]) 1997   Dr. 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   Obesity    Obstructive sleep apnea 01/10/2021   PAD (peripheral artery disease) (HCC)    PONV (postoperative nausea and vomiting)    PVD (peripheral vascular disease) (HCC)    Stroke (HCC) 03/28/2023   Thoracic aortic aneurysm (HCC) 05/09/2022   Echocardiogram 10/23: EF 55-60, no RWMA, mild LVH, normal RVSF, mild MR, s/p AVR with mild AI and normal gradient (15 mmHg), mod dilation of aortic root and ascending aorta (45 mm).  Chest CT 10/23: Ascending thoracic aorta 4.1 cm; aortic atherosclerosis    Family History  Problem Relation Age of Onset   Hypertension Father    Diabetes Father    Lung cancer Father    Breast  cancer Sister    Healthy Brother    Heart disease Maternal Grandmother    Healthy Maternal Grandfather    Cancer Maternal Grandfather    Migraines Neg Hx    Headache Neg Hx    Colon cancer Neg Hx    Pancreatic cancer Neg Hx    Stomach cancer Neg Hx    Esophageal cancer Neg Hx    Sleep apnea Neg Hx    Colon polyps Neg Hx    Rectal cancer Neg Hx    Past Surgical History:  Procedure Laterality Date   AORTIC VALVE REPLACEMENT  07/18/2011   Procedure: AORTIC VALVE REPLACEMENT (AVR);  Surgeon: Delight Ovens, MD;  Location: [facility] OR;  Service: Open Heart Surgery;  Laterality: N/A;   CARDIAC CATHETERIZATION  2009, 07/11/11   Dr. Eldridge Dace   CORONARY ARTERY BYPASS GRAFT  07/18/2011   Procedure: CORONARY ARTERY BYPASS GRAFTING (CABG);  Surgeon: Delight Ovens, MD;  Location: [facility] OR;  Service: Open Heart Surgery;  Laterality: N/A;  times one to mammary artery, left   fem stent Right 2017   FEMORAL ARTERY - POPLITEAL ARTERY BYPASS GRAFT     KNEE ARTHROSCOPY     left   MRI     to visualize aortic valve   PERIPHERAL VASCULAR CATHETERIZATION N/A 07/12/2016   Procedure: Abdominal Aortogram w/Lower Extremity;  Surgeon: Maeola Harman, MD;  Location: [facility] INVASIVE CV LAB;  Service: Cardiovascular;  Laterality: N/A;   PERIPHERAL VASCULAR CATHETERIZATION Right 07/12/2016   Procedure: Peripheral Vascular Atherectomy;  Surgeon: Maeola Harman, MD;  Location[facility] INVASIVE CV LAB;  Service: Cardiovascular;  Laterality: Right;  Anterior Tibial and Popliteal   ROTATOR CUFF REPAIR     Left   US ECHOCARDIOGRAPHY     Social History   Social History Narrative   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   Left handed   Caffeine: about 1 cups daily   Immunization History  Administered Date(s) Administered   Influenza Split 06/25/2017   Influenza, Seasonal, Injecte, Preservative Fre 07/02/2016   Influenza,inj,quad, With Preservative 06/14/2015, 06/25/2017   Influenza-Unspecified  06/14/2015, 06/25/2017, 08/01/2018, 08/12/2019, 05/31/2020   PFIZER(Purple Top)SARS-COV-2 Vaccination 09/28/2019, 10/28/2019   Pneumococcal Conjugate-13 06/14/2015, 06/25/2017   Pneumococcal Polysaccharide-23 06/25/2017, 06/07/2020   Pneumococcal-Unspecified 06/07/2020   Td 09/25/2003   Tdap 07/01/2015, 08/01/2018     Objective: Vital Signs: BP 117/73 (BP Location: Right Arm, Patient Position: Sitting, Cuff Size: Normal)   Pulse 81   Resp 14   Ht 5' 10.25" (1.784 m)   Wt 197 lb (89.4 kg)   BMI 28.07 kg/m    Physical Exam Eyes:     Conjunctiva/sclera: Conjunctivae normal.  Cardiovascular:     Rate and Rhythm: Normal  rate and regular rhythm.  Pulmonary:     Effort: Pulmonary effort is normal.     Breath sounds: Normal breath sounds.  Musculoskeletal:     Right lower leg: No edema.     Left lower leg: No edema.  Lymphadenopathy:     Cervical: No cervical adenopathy.  Skin:    General: Skin is warm and dry.     Findings: No rash.  Neurological:     Mental Status: He is alert.  Psychiatric:        Mood and Affect: Mood normal.      Musculoskeletal Exam:  Neck full ROM no tenderness Left shoulder active abduction is limited to less than horizontal, passive range of motion better but still mildly decreased abduction and external rotation, no palpable swelling Elbows slightly restricted flexion extension range of motion, no tenderness or swelling Mild Heberden's nodes in fingers of both hands, no focal tenderness or swelling, no palpable nodules or reproducible triggering No paraspinal tenderness to palpation over upper and lower back Hip normal internal and external rotation without pain, no tenderness to lateral hip palpation Left knee slightly restricted extension compared to right with soft endpoint, no tenderness or swelling Ankles full ROM no tenderness or swelling   Investigation: No additional findings.  Imaging: No results found.  Recent Labs: Lab Results   Component Value Date   WBC 6.3 05/18/2023   HGB 15.3 05/18/2023   PLT 159 05/18/2023   NA 142 10/25/2021   K 4.2 10/25/2021   CL 109 (H) 10/25/2021   CO2 18 (L) 10/25/2021   GLUCOSE 99 10/25/2021   BUN 17 10/25/2021   CREATININE 1.30 (H) 05/07/2023   BILITOT 0.5 05/12/2016   ALKPHOS 55 05/12/2016   AST 40 (H) 05/12/2016   ALT 38 05/12/2016   PROT 6.6 05/12/2016   ALBUMIN 3.9 05/12/2016   CALCIUM 9.2 10/25/2021   GFRAA 78 12/15/2016    Speciality Comments: No specialty comments available.  Procedures:  No procedures performed Allergies: Clopidogrel, Sulfamethoxazole-trimethoprim, Codeine, Flexeril [cyclobenzaprine], and Morphine and codeine   Assessment / Plan:     Visit Diagnoses: Psoriatic arthritis (HCC) - Plan: Sedimentation rate, C-reactive protein, Rheumatoid factor, C3 and C4  Established history of psoriatic arthritis but I do not see any definite signs of joint inflammation on exam today.  Will recheck sedimentation rate and CRP for systemic inflammatory activity assessment.  Also checking rheumatoid factor and complements as there was questionable history of RA and referral.  For now I do not see a clear indication to resume Humira treatment unless we see a progression of symptoms after more time off of treatment.  Could be in disease remission after well-controlled inflammation for several years.  Not a candidate for NSAIDs due to comorbidities and long-term anticoagulation.  Psoriasis  Skin disease appears to be in remission no active lesions and not requiring any topical medication.  Encounter for long-term (current) use of high-risk medication - Plan: QuantiFERON-TB Gold Plus  Rechecking QuantiFERON baseline screening as anticipate we may need to resume DMARD treatment.  He had previous negative hepatitis screening with [facility].  Recent CBC and CMP checked.  Trigger finger, left ring finger Trigger finger, right index finger  Discussed trigger  finger symptoms on both hands not currently severely painful or limiting activity.  He has had relief with prior steroid injections.  Discussed could repeat ultrasound-guided injections if getting more symptomatic but not bad today.  Rotator cuff tear arthropathy of left shoulder  Continuing  follow-up with orthopedic surgery clinic with history of rotator cuff tear arthropathy also for bilateral knee osteoarthritis.  Orders: Orders Placed This Encounter  Procedures   Sedimentation rate   C-reactive protein   QuantiFERON-TB Gold Plus   Rheumatoid factor   C3 and C4   No orders of the defined types were placed in this encounter.   Follow-Up Instructions: Return in about 3 months (around 09/11/2023) for New pt PsA obs f/u 3mos.   Fuller Plan, MD  Note - This record has been created using [employer].  Chart creation errors have been sought, but may not always  have been located. Such creation errors do not reflect on  the standard of medical care.

## 2023-06-11 ENCOUNTER — Ambulatory Visit: Payer: BC Managed Care – PPO | Attending: Internal Medicine | Admitting: Internal Medicine

## 2023-06-11 ENCOUNTER — Encounter: Payer: Self-pay | Admitting: Internal Medicine

## 2023-06-11 VITALS — BP 117/73 | HR 81 | Resp 14 | Ht 70.25 in | Wt 197.0 lb

## 2023-06-11 DIAGNOSIS — M75102 Unspecified rotator cuff tear or rupture of left shoulder, not specified as traumatic: Secondary | ICD-10-CM

## 2023-06-11 DIAGNOSIS — Z79899 Other long term (current) drug therapy: Secondary | ICD-10-CM

## 2023-06-11 DIAGNOSIS — L4050 Arthropathic psoriasis, unspecified: Secondary | ICD-10-CM | POA: Insufficient documentation

## 2023-06-11 DIAGNOSIS — M65342 Trigger finger, left ring finger: Secondary | ICD-10-CM | POA: Diagnosis not present

## 2023-06-11 DIAGNOSIS — M12812 Other specific arthropathies, not elsewhere classified, left shoulder: Secondary | ICD-10-CM

## 2023-06-11 DIAGNOSIS — L409 Psoriasis, unspecified: Secondary | ICD-10-CM

## 2023-06-11 DIAGNOSIS — M65321 Trigger finger, right index finger: Secondary | ICD-10-CM | POA: Insufficient documentation

## 2023-06-12 NOTE — Progress Notes (Signed)
Sedimentation rate, CRP, and complements are normal.  So no evidence of disease activity right now.  I think we are okay to just monitor for his psoriasis and any joint inflammation.

## 2023-06-13 LAB — QUANTIFERON-TB GOLD PLUS
Mitogen-NIL: 6.93 [IU]/mL
NIL: 0.03 [IU]/mL
QuantiFERON-TB Gold Plus: NEGATIVE
TB1-NIL: 0 [IU]/mL
TB2-NIL: 0 [IU]/mL

## 2023-06-13 LAB — C3 AND C4
C3 Complement: 126 mg/dL (ref 82–185)
C4 Complement: 23 mg/dL (ref 15–53)

## 2023-06-13 LAB — C-REACTIVE PROTEIN: CRP: <3 mg/L (ref <8.0)

## 2023-06-13 LAB — RHEUMATOID FACTOR: Rheumatoid fact SerPl-aCnc: <10 [IU]/mL (ref <14)

## 2023-06-13 LAB — SEDIMENTATION RATE: Sed Rate: 2 mm/h (ref 0–20)

## 2023-06-14 ENCOUNTER — Other Ambulatory Visit (HOSPITAL_COMMUNITY): Payer: Self-pay

## 2023-06-14 MED ORDER — APIXABAN 5 MG PO TABS
5.0000 mg | ORAL_TABLET | Freq: Two times a day (BID) | ORAL | 5 refills | Status: DC
  Filled 2023-06-14: qty 60, 30d supply, fill #0
  Filled 2023-06-29 – 2023-07-07 (×2): qty 60, 30d supply, fill #1
  Filled 2023-08-01: qty 60, 30d supply, fill #2
  Filled 2023-09-06: qty 60, 30d supply, fill #3

## 2023-06-15 ENCOUNTER — Other Ambulatory Visit (HOSPITAL_COMMUNITY): Payer: Self-pay

## 2023-06-15 MED ORDER — DAPAGLIFLOZIN PROPANEDIOL 10 MG PO TABS
10.0000 mg | ORAL_TABLET | Freq: Every day | ORAL | 0 refills | Status: DC
  Filled 2023-06-15: qty 30, 30d supply, fill #0
  Filled 2023-07-10: qty 30, 30d supply, fill #1
  Filled 2023-08-01: qty 30, 30d supply, fill #2

## 2023-06-18 ENCOUNTER — Telehealth: Payer: Self-pay | Admitting: Cardiovascular Disease

## 2023-06-18 ENCOUNTER — Other Ambulatory Visit: Payer: Self-pay

## 2023-06-18 ENCOUNTER — Telehealth: Payer: Self-pay | Admitting: Physical Medicine and Rehabilitation

## 2023-06-18 DIAGNOSIS — Z79899 Other long term (current) drug therapy: Secondary | ICD-10-CM

## 2023-06-18 DIAGNOSIS — D6869 Other thrombophilia: Secondary | ICD-10-CM

## 2023-06-18 NOTE — Telephone Encounter (Signed)
Spoke with patient and he states the pharmacy called and let him know his atorvastatin was filled but for only 30 days and he would like to know why.   Informed patient that the Rx that was sent on 06/04/23 is for  90 days and 3 refills.   He states they told him its only 30 days.  I called pharmacy and she states it was an old rx they filled that had 3 tablets left. She also had the new Rx for 90 days with 3 refills. She filled the new Rx   Tried to reach patient again. Left voicemail to return call to office.   Patient also states he is supposed to have lab work done. I do not see any orders for labs.

## 2023-06-18 NOTE — Progress Notes (Signed)
Elliot Cousin, RMA   05/07/23 11:06 AM Note ----- Message from Tereso Newcomer sent at 05/07/2023  8:38 AM EDT ----- Results sent to [facility] via MyChart. See MyChart comments below: PLAN: -Repeat BMET in 1 month     Pt states he came into office this month, but didn't have the blood work done. Advised I'll place the order again and he can come by our office, or any [facility] location, and get it drawn. Order placed and released at this time.

## 2023-06-18 NOTE — Telephone Encounter (Signed)
Pt c/o medication issue:  1. Name of Medication: atorvastatin (LIPITOR) 80 MG tablet   2. How are you currently taking this medication (dosage and times per day)?   3. Are you having a reaction (difficulty breathing--STAT)?   4. What is your medication issue? Patient is requesting pharmacy be called. He states pharmacy only has a 30 day refill for this medication and he would like for this to be corrected with them.

## 2023-06-18 NOTE — Telephone Encounter (Signed)
Patient is having pain in both shoulder would like another injection got last injection in left shoulder 02/14/23, please advise

## 2023-06-19 ENCOUNTER — Other Ambulatory Visit: Payer: Self-pay | Admitting: Physical Medicine and Rehabilitation

## 2023-06-19 ENCOUNTER — Telehealth: Payer: Self-pay | Admitting: Gastroenterology

## 2023-06-19 DIAGNOSIS — G8929 Other chronic pain: Secondary | ICD-10-CM

## 2023-06-19 NOTE — Telephone Encounter (Signed)
Patient called and stated he received a message in his MyChart that someone was trying to reach him but was unsuccessful regarding his order imaging results. Patient also is requesting a call back. Please Advise.

## 2023-06-19 NOTE — Telephone Encounter (Signed)
Larissa Pegg, Please let the patient know that I have reviewed his MRI/MRCP. The pancreas cyst remains unchanged.  It connects to the pancreas duct. 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 This has some premalignant potential but with overall stability over the last few years as it was being followed by Dr. Christella Hartigan previously and at this point, I think the likelihood of this developing into something more becomes a little bit less each time we monitor. I do agree with continued monitoring and recommended 2-year follow-up MRI/MRCP after which at that point we can decide if we continue to monitor or not. He does have some kidney cysts but no further follow-up is indicated per report I am placing the patient's PCP on here as well. Please reach out to him and if he agrees put in the 2-year follow-up. We probably should see him at some point in the next year just to make sure that he continues to want to monitor the area. Thanks. GM

## 2023-06-19 NOTE — Telephone Encounter (Signed)
Message sent to the pt MyChart - he does view his messages. Will call pt in 2 years for repeat MRI

## 2023-06-19 NOTE — Telephone Encounter (Signed)
Injection lasted for about 6 months.  No new injury.  Same pain in both of shoulders no different that previous injection. More on L side than R. L shoulder 8 - R shoulder 5

## 2023-06-25 ENCOUNTER — Ambulatory Visit (INDEPENDENT_AMBULATORY_CARE_PROVIDER_SITE_OTHER): Payer: BC Managed Care – PPO | Admitting: Physical Medicine and Rehabilitation

## 2023-06-25 ENCOUNTER — Telehealth: Payer: Self-pay | Admitting: Gastroenterology

## 2023-06-25 ENCOUNTER — Other Ambulatory Visit: Payer: Self-pay

## 2023-06-25 DIAGNOSIS — M25512 Pain in left shoulder: Secondary | ICD-10-CM

## 2023-06-25 DIAGNOSIS — G8929 Other chronic pain: Secondary | ICD-10-CM

## 2023-06-25 NOTE — Progress Notes (Signed)
   [name] - 72 y.o. male MRN [id number]  Date of birth: 07-02-1951  Office Visit Note: Visit Date: 06/25/2023 PCP: Aliene Beams, MD Referred by: Aliene Beams, MD  Subjective: No chief complaint on file.  HPI:  [name] is a 72 y.o. male who comes in today at the request of Dr. Doneen Poisson for planned Left anesthetic glenohumeral arthrogram with fluoroscopic guidance.  The patient has failed conservative care including home exercise, medications, time and activity modification.  This injection will be diagnostic and hopefully therapeutic.  Please see requesting physician notes for further details and justification.   ROS Otherwise per HPI.  Assessment & Plan: Visit Diagnoses:    ICD-10-CM   1. Chronic left shoulder pain  M25.512 XR C-ARM NO REPORT   G89.29 Large Joint Inj: L glenohumeral      Plan: No additional findings.   Meds & Orders: No orders of the defined types were placed in this encounter.   Orders Placed This Encounter  Procedures   Large Joint Inj: L glenohumeral   XR C-ARM NO REPORT    Follow-up: Return for visit to requesting provider as needed.   Procedures: Large Joint Inj: L glenohumeral on 06/25/2023 1:17 PM Indications: pain and diagnostic evaluation Details: 22 G 3.5 in needle, fluoroscopy-guided anteromedial approach  Arthrogram: No  Medications: 40 mg triamcinolone acetonide 40 MG/ML; 5 mL bupivacaine 0.25 % Outcome: tolerated well, no immediate complications  There was excellent flow of contrast producing a partial arthrogram of the glenohumeral joint. The patient did have relief of symptoms during the anesthetic phase of the injection. Procedure, treatment alternatives, risks and benefits explained, specific risks discussed. Consent was given by the patient. Immediately prior to procedure a time out was called to verify the correct patient, procedure, equipment, support staff and site/side marked as required. Patient was  prepped and draped in the usual sterile fashion.          Clinical History: No specialty comments available.     Objective:  VS:  HT:    WT:   BMI:     BP:   HR: bpm  TEMP: ( )  RESP:  Physical Exam   Imaging: No results found.

## 2023-06-25 NOTE — Telephone Encounter (Signed)
[name], Please let the patient know that I have reviewed his MRI/MRCP. The pancreas cyst remains unchanged.  It connects to the pancreas duct. 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 This has some premalignant potential but with overall stability over the last few years as it was being followed by Dr. Christella Hartigan previously and at this point, I think the likelihood of this developing into something more becomes a little bit less each time we monitor. I do agree with continued monitoring and recommended 2-year follow-up MRI/MRCP after which at that point we can decide if we continue to monitor or not. He does have some kidney cysts but no further follow-up is indicated per report I am placing the patient's PCP on here as well. Please reach out to him and if he agrees put in the 2-year follow-up. We probably should see him at some point in the next year just to make sure that he continues to want to monitor the area. Thanks. GM

## 2023-06-25 NOTE — Telephone Encounter (Signed)
Inbound call from patient requesting a call to discuss 11/06 MRI results. States to leave a voicemail if he is unable to answer phone. Please advise, thank you.

## 2023-06-26 NOTE — Telephone Encounter (Signed)
Left message on machine to call back  

## 2023-06-27 NOTE — Telephone Encounter (Signed)
Left message on machine to call back  I have been unable to reach the pt by phone will await further communication from the pt.

## 2023-06-29 ENCOUNTER — Other Ambulatory Visit (HOSPITAL_COMMUNITY): Payer: Self-pay

## 2023-06-30 MED ORDER — TRIAMCINOLONE ACETONIDE 40 MG/ML IJ SUSP
40.0000 mg | INTRAMUSCULAR | Status: AC | PRN
Start: 2023-06-25 — End: 2023-06-25
  Administered 2023-06-25: 40 mg via INTRA_ARTICULAR

## 2023-06-30 MED ORDER — BUPIVACAINE HCL 0.25 % IJ SOLN
5.0000 mL | INTRAMUSCULAR | Status: AC | PRN
Start: 2023-06-25 — End: 2023-06-25
  Administered 2023-06-25: 5 mL via INTRA_ARTICULAR

## 2023-07-02 ENCOUNTER — Telehealth: Payer: Self-pay | Admitting: Physical Medicine and Rehabilitation

## 2023-07-02 NOTE — Telephone Encounter (Signed)
Patient called and said that the shot didn't do no relief and ask if you could call in something for pain. [phone]

## 2023-07-03 ENCOUNTER — Other Ambulatory Visit: Payer: Self-pay | Admitting: Physical Medicine and Rehabilitation

## 2023-07-03 MED ORDER — TRAMADOL HCL 50 MG PO TABS
50.0000 mg | ORAL_TABLET | Freq: Three times a day (TID) | ORAL | 0 refills | Status: DC | PRN
Start: 2023-07-03 — End: 2023-07-26

## 2023-07-10 ENCOUNTER — Other Ambulatory Visit (HOSPITAL_COMMUNITY): Payer: Self-pay

## 2023-07-11 ENCOUNTER — Other Ambulatory Visit: Payer: Self-pay | Admitting: *Deleted

## 2023-07-11 MED ORDER — EZETIMIBE 10 MG PO TABS: 10.0000 mg | ORAL_TABLET | Freq: Every day | ORAL | 3 refills | Status: DC

## 2023-07-13 ENCOUNTER — Encounter: Payer: Self-pay | Admitting: Orthopedic Surgery

## 2023-07-13 ENCOUNTER — Other Ambulatory Visit (INDEPENDENT_AMBULATORY_CARE_PROVIDER_SITE_OTHER): Payer: Self-pay

## 2023-07-13 ENCOUNTER — Ambulatory Visit (INDEPENDENT_AMBULATORY_CARE_PROVIDER_SITE_OTHER): Payer: BC Managed Care – PPO | Admitting: Orthopedic Surgery

## 2023-07-13 DIAGNOSIS — M25512 Pain in left shoulder: Secondary | ICD-10-CM | POA: Diagnosis not present

## 2023-07-13 DIAGNOSIS — G8929 Other chronic pain: Secondary | ICD-10-CM | POA: Diagnosis not present

## 2023-07-13 NOTE — Progress Notes (Signed)
Office Visit Note   Patient: [name]           Date of Birth: 02-08-1951           MRN: [id number] Visit Date: 07/13/2023 Requested by: Aliene Beams, MD [phone] [address] [place],  [place] [address] PCP: Aliene Beams, MD  Subjective: Chief Complaint  Patient presents with   Left Shoulder - Follow-up, Pain    HPI: [name] is a 72 y.o. male who presents to the office reporting bilateral shoulder pain left worse than right.  Patient is left-hand dominant.  Had a glenohumeral joint injection on 02/14/2023 which gave him good relief for 3 to 4 months but then a repeat injection on 06/25/2023 did not give him really any relief.  Reports decreased range of motion as well as pain which wakes him from sleep at night.  Pain is not constant.  He does report pain with any attempted activity.  He states he can move it reasonably well forward and back but abduction is difficult for him.  He is able to work around this in terms of mowing the grass and blowing weeds.  He really cannot play golf because of the shoulder.  Has a history of rotator cuff repair about 15 years ago.  He did have a stroke in August of this year and currently takes Eliquis for atrial fibrillation.  Also has a history of aortic valve replacement.  Patient also has had cervical spine MRI 2 years ago which did show findings that C3-4 C4-5 both on the right and left-hand side..                ROS: All systems reviewed are negative as they relate to the chief complaint within the history of present illness.  Patient denies fevers or chills.  Assessment & Plan: Visit Diagnoses:  1. Chronic left shoulder pain     Plan: Impression is rotator cuff arthropathy with fairly significant weakness in that left shoulder.  Biceps and triceps strength are symmetric on both sides along with grip and forearm strength.  Plan at this time is to consider the patient in terms of risk stratification for reverse shoulder  replacement.  He did have a stroke in August and in my experience most of the time 6 months elapses prior to any consideration of elective surgery for these patients.  We would also need to manage his anticoagulants in the perioperative period he is seeing Dr. Excell Seltzer in January.  I will request that Dr. Excell Seltzer evaluate him for suitability for elective joint replacement sometime next year.  Follow-Up Instructions: No follow-ups on file.   Orders:  Orders Placed This Encounter  Procedures   XR Shoulder Left   No orders of the defined types were placed in this encounter.     Procedures: No procedures performed   Clinical Data: No additional findings.  Objective: Vital Signs: There were no vitals taken for this visit.  Physical Exam:  Constitutional: Patient appears well-developed HEENT:  Head: Normocephalic Eyes:EOM are normal Neck: Normal range of motion Cardiovascular: Normal rate Pulmonary/chest: Effort normal Neurologic: Patient is alert Skin: Skin is warm Psychiatric: Patient has normal mood and affect  Ortho Exam: Ortho exam demonstrates range of motion on the right of 70/95/160 on the left 45/60/110.  Deltoid does fire.  Has pretty significant rotator cuff weakness to external rotation as well as internal rotation.  Biceps and tricep strength generally symmetric between sides at 5 out  of 5.  Also has 5 out of 5 grip EPL FPL interosseous wrist flexors extension strength.  Radial pulse intact bilaterally.  Does have some coarseness and popping with passive range of motion on the left but not on the right.  Specialty Comments:  No specialty comments available.  Imaging: No results found.   PMFS History: Patient Active Problem List   Diagnosis Date Noted   Psoriatic arthritis (HCC) 06/11/2023   Trigger finger, left ring finger 06/11/2023   Trigger finger, right index finger 06/11/2023   Hypercoagulable state due to paroxysmal atrial fibrillation (HCC) 04/20/2023    Thoracic aortic aneurysm (HCC) 05/09/2022   Abnormal MRI, neck 11/16/2021   Rotator cuff arthropathy of left shoulder 08/17/2021   Chronic left shoulder pain 08/17/2021   Chronic frontal sinusitis 07/07/2021   Facial paresthesia 07/07/2021   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 07/07/2021   Cervical stenosis of spinal canal 06/07/2021   Meningeal irritation 06/07/2021   Idiopathic intracranial hypotension 06/07/2021   Cervicalgia 03/07/2021   Chronic daily headache 03/07/2021   Migraine 01/10/2021   Obstructive sleep apnea 01/10/2021   Renal stones 10/04/2020   Type 2 diabetes mellitus with unspecified complications (HCC) 10/04/2020   Benign prostatic hyperplasia without lower urinary tract symptoms 09/30/2020   GERD (gastroesophageal reflux disease) 05/05/2020   Irritable bowel syndrome 02/19/2020   Dependence on other enabling machines and devices 02/19/2020   ED (erectile dysfunction) of organic origin 02/19/2020   Major depression single episode, in partial remission (HCC) 02/19/2020   Obesity 02/19/2020   Chronic right-sided low back pain without sciatica 12/16/2019   Primary osteoarthritis of left knee 09/16/2019   Rotator cuff tear arthropathy of left shoulder 09/16/2019   Primary osteoarthritis of right knee 09/16/2019   Complex tear of medial meniscus of right knee as current injury 03/04/2019   Chronic diastolic heart failure (HCC) 11/25/2018   Bruxism (teeth grinding) 11/25/2018   Cephalalgia 11/25/2018   Cerebrovascular disease 11/25/2018   Morning headache 11/25/2018   Sleep related headaches 09/26/2018   Chronic idiopathic gout without tophus 02/13/2018   Hypertension    History of adenomatous polyp of colon 03/14/2016   Thrombosed hemorrhoids 03/01/2016   Encounter for long-term (current) use of high-risk medication 06/30/2015   Psoriasis 12/25/2014   Major depressive disorder, recurrent, in full remission (HCC) 06/22/2014   Gout  04/21/2014   Chest pain, somewhat atypical 12/28/2013   Dyspnea on exertion 12/27/2011   Dizziness 08/15/2011   Heart valve replaced by other means 07/28/2011   Paroxysmal atrial fibrillation (HCC) 07/28/2011   Aortic stenosis, severe 07/18/2011   Coronary artery disease 07/06/2011   Peripheral vascular disease (HCC) 07/06/2011   Hyperlipidemia 07/06/2011   Bicuspid aortic valve 07/06/2011   Past Medical History:  Diagnosis Date   Aortic stenosis    a. severe bicuspid AV stenosis s/p pericardial tissue valve 2012.   Arthritis    Coronary artery disease    a. s/p CABG (LIMA-LCx) at time of AVR 2012, mild nonobstructive LAD and RCA stenoses. // Myoview 03/2022: EF 48, no ischemia; low risk   Depression    Depression    Diabetes mellitus without complication [facility])    ED (erectile dysfunction)    Encounter for long-term (current) use of high-risk medication 06/30/2015   Fatty liver    Femoral-popliteal bypass graft occlusion, right [facility]) 1997   Dr. 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   Obesity    Obstructive sleep apnea 01/10/2021   PAD (peripheral artery disease) (HCC)    PONV (postoperative nausea and vomiting)    PVD (peripheral vascular disease) (HCC)    Stroke (HCC) 03/28/2023   Thoracic aortic aneurysm (HCC) 05/09/2022   Echocardiogram 10/23: EF 55-60, no RWMA, mild LVH, normal RVSF, mild MR, s/p AVR with mild AI and normal gradient (15 mmHg), mod dilation of aortic root and ascending aorta (45 mm).  Chest CT 10/23: Ascending thoracic aorta 4.1 cm; aortic atherosclerosis    Family History  Problem Relation Age of Onset   Hypertension  Father    Diabetes Father    Lung cancer Father    Breast cancer Sister    Healthy Brother    Heart disease Maternal Grandmother    Healthy Maternal Grandfather    Cancer Maternal Grandfather    Migraines Neg Hx    Headache Neg Hx    Colon cancer Neg Hx    Pancreatic cancer Neg Hx    Stomach cancer Neg Hx    Esophageal cancer Neg Hx    Sleep apnea Neg Hx    Colon polyps Neg Hx    Rectal cancer Neg Hx     Past Surgical History:  Procedure Laterality Date   AORTIC VALVE REPLACEMENT  07/18/2011   Procedure: AORTIC VALVE REPLACEMENT (AVR);  Surgeon: Delight Ovens, MD;  Location: [facility] OR;  Service: Open Heart Surgery;  Laterality: N/A;   CARDIAC CATHETERIZATION  2009, 07/11/11   Dr. Eldridge Dace   CORONARY ARTERY BYPASS GRAFT  07/18/2011   Procedure: CORONARY ARTERY BYPASS GRAFTING (CABG);  Surgeon: Delight Ovens, MD;  Location: [facility] OR;  Service: Open Heart Surgery;  Laterality: N/A;  times one to mammary artery, left   fem stent Right 2017   FEMORAL ARTERY - POPLITEAL ARTERY BYPASS GRAFT     KNEE ARTHROSCOPY     left   MRI     to visualize aortic valve   PERIPHERAL VASCULAR CATHETERIZATION N/A 07/12/2016   Procedure: Abdominal Aortogram w/Lower Extremity;  Surgeon: Maeola Harman, MD;  Location: [facility] INVASIVE CV LAB;  Service: Cardiovascular;  Laterality: N/A;   PERIPHERAL VASCULAR CATHETERIZATION Right 07/12/2016   Procedure: Peripheral Vascular Atherectomy;  Surgeon: Maeola Harman, MD;  Location: [facility] INVASIVE CV LAB;  Service: Cardiovascular;  Laterality: Right;  Anterior Tibial and Popliteal   ROTATOR CUFF REPAIR     Left   US ECHOCARDIOGRAPHY     Social History   Occupational History   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[employer]   Tobacco Use   Smoking status: Never    Passive exposure: Never   Smokeless tobacco: Never   Tobacco comments:    Never smoked 05/18/23  Vaping Use   Vaping status: Never Used  Substance and Sexual Activity    Alcohol use: No   Drug use: No   Sexual activity: Not on file

## 2023-07-19 ENCOUNTER — Telehealth: Payer: Self-pay

## 2023-07-19 NOTE — Telephone Encounter (Signed)
error 

## 2023-07-20 DIAGNOSIS — I739 Peripheral vascular disease, unspecified: Secondary | ICD-10-CM | POA: Diagnosis not present

## 2023-07-20 DIAGNOSIS — I10 Essential (primary) hypertension: Secondary | ICD-10-CM | POA: Diagnosis not present

## 2023-07-20 DIAGNOSIS — M25512 Pain in left shoulder: Secondary | ICD-10-CM | POA: Diagnosis not present

## 2023-07-20 DIAGNOSIS — E119 Type 2 diabetes mellitus without complications: Secondary | ICD-10-CM | POA: Diagnosis not present

## 2023-07-20 DIAGNOSIS — Z23 Encounter for immunization: Secondary | ICD-10-CM | POA: Diagnosis not present

## 2023-07-20 DIAGNOSIS — E782 Mixed hyperlipidemia: Secondary | ICD-10-CM | POA: Diagnosis not present

## 2023-07-24 ENCOUNTER — Other Ambulatory Visit: Payer: Self-pay | Admitting: Physical Medicine and Rehabilitation

## 2023-07-26 ENCOUNTER — Other Ambulatory Visit: Payer: Self-pay | Admitting: Orthopedic Surgery

## 2023-07-26 ENCOUNTER — Other Ambulatory Visit: Payer: Self-pay | Admitting: Physical Medicine and Rehabilitation

## 2023-07-26 ENCOUNTER — Telehealth: Payer: Self-pay | Admitting: Orthopedic Surgery

## 2023-07-26 MED ORDER — TRAMADOL HCL 50 MG PO TABS: 50.0000 mg | ORAL_TABLET | Freq: Three times a day (TID) | ORAL | 0 refills | Status: DC | PRN

## 2023-07-26 NOTE — Telephone Encounter (Signed)
Refilled.  thx

## 2023-07-26 NOTE — Telephone Encounter (Signed)
Mr. Dion is awaiting shoulder surgery with Dr. August Saucer. He states that she has some health issues he has to work on before he can proceed.  He would like to have an Rx of Tramadol called into [facility] to help with this pain in the meantime.  He was initially given tramadol by Dr. Alvester Morin, but this was not refilled upon ask because he is not currently treating the patient.  Mr. Chatel call back # is [phone]. Thank you!

## 2023-07-26 NOTE — Telephone Encounter (Signed)
Done thx

## 2023-07-26 NOTE — Telephone Encounter (Signed)
Notified patient.

## 2023-08-02 ENCOUNTER — Other Ambulatory Visit: Payer: Self-pay | Admitting: Cardiovascular Disease

## 2023-08-02 ENCOUNTER — Other Ambulatory Visit (HOSPITAL_COMMUNITY): Payer: Self-pay

## 2023-08-04 ENCOUNTER — Other Ambulatory Visit (HOSPITAL_COMMUNITY): Payer: Self-pay

## 2023-08-21 ENCOUNTER — Telehealth: Payer: Self-pay | Admitting: Adult Health

## 2023-08-21 NOTE — Telephone Encounter (Signed)
Spoke with pt. He got his machine 01/21/2019 so he will not be due until 01/22/2024 or after. Pt will also need updated appt and sleep study. He states his machine seems to be working ok right now but he does wake up at night after about 4-5 hours. He would like [place] to check his DL. I can't see anything past May 2024 so he will come by our office for a DL this afternoon. He verbalized appreciation for the call back. At this time we will keep his appt as scheduled in May.

## 2023-08-21 NOTE — Telephone Encounter (Signed)
Pt said contacted the [facility] about getting a new CPAP machine. Was informed to contact [facility] for provider sent a order for a new CPAP machine. Pt stated, have had machine for more 5 years. Would like a call back.

## 2023-08-29 ENCOUNTER — Ambulatory Visit (INDEPENDENT_AMBULATORY_CARE_PROVIDER_SITE_OTHER): Payer: BC Managed Care – PPO | Admitting: Vascular Surgery

## 2023-08-29 ENCOUNTER — Encounter: Payer: Self-pay | Admitting: Vascular Surgery

## 2023-08-29 ENCOUNTER — Ambulatory Visit (HOSPITAL_COMMUNITY)
Admission: RE | Admit: 2023-08-29 | Discharge: 2023-08-29 | Disposition: A | Discharge status: Home / Self Care | Payer: BC Managed Care – PPO | Source: Ambulatory Visit | Attending: Vascular Surgery | Admitting: Vascular Surgery

## 2023-08-29 VITALS — BP 111/83 | Temp 98.0°F | Resp 20 | Ht 70.0 in | Wt 194.9 lb

## 2023-08-29 DIAGNOSIS — I63239 Cerebral infarction due to unspecified occlusion or stenosis of unspecified carotid arteries: Secondary | ICD-10-CM | POA: Insufficient documentation

## 2023-08-29 DIAGNOSIS — I739 Peripheral vascular disease, unspecified: Secondary | ICD-10-CM | POA: Diagnosis not present

## 2023-08-29 NOTE — Progress Notes (Signed)
Patient ID: [name], male   DOB: 1951-01-08, 73 y.o.   MRN: [id number]  Reason for Consult: Follow-up   Referred by Aliene Beams, MD  Subjective:     HPI:  [name] is a 73 y.o. male has remote history of right popliteal and anterior tibial artery atherectomy with balloon angioplasty.  He has recurrent claudication in the right lower extremity but he is able to continue to work.  More recently he was diagnosed with bilateral acute CVA from which she has mostly resolved all symptoms and is now anticoagulated due to new finding of atrial fibrillation.  At his initial hospitalization he was found to have right ICA stenosis although this was not treated when attempted intervention by interventional radiology was performed.  He is now here without any further symptoms now anticoagulated and carotid duplex performed today.   Past Medical History:  Diagnosis Date   Aortic stenosis    a. severe bicuspid AV stenosis s/p pericardial tissue valve 2012.   Arthritis    Coronary artery disease    a. s/p CABG (LIMA-LCx) at time of AVR 2012, mild nonobstructive LAD and RCA stenoses. // Myoview 03/2022: EF 48, no ischemia; low risk   Depression    Depression    Diabetes mellitus without complication (HCC)    ED (erectile dysfunction)    Encounter for long-term (current) use of high-risk medication 06/30/2015   Fatty liver    Femoral-popliteal bypass graft occlusion, right [facility]) 1997   Dr. 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   Obesity    Obstructive sleep apnea 01/10/2021   PAD (peripheral artery disease) (HCC)    PONV (postoperative nausea and  vomiting)    PVD (peripheral vascular disease) (HCC)    Stroke (HCC) 03/28/2023   Thoracic aortic aneurysm (HCC) 05/09/2022   Echocardiogram 10/23: EF 55-60, no RWMA, mild LVH, normal RVSF, mild MR, s/p AVR with mild AI and normal gradient (15 mmHg), mod dilation of aortic root and ascending aorta (45 mm).  Chest CT 10/23: Ascending thoracic aorta 4.1 cm; aortic atherosclerosis   Family History  Problem Relation Age of Onset   Hypertension Father    Diabetes Father    Lung cancer Father    Breast cancer Sister    Healthy Brother    Heart disease Maternal Grandmother    Healthy Maternal Grandfather    Cancer Maternal Grandfather    Migraines Neg Hx    Headache Neg Hx    Colon cancer Neg Hx    Pancreatic cancer Neg Hx    Stomach cancer Neg Hx    Esophageal cancer Neg Hx    Sleep apnea Neg Hx    Colon polyps Neg Hx    Rectal cancer Neg Hx    Past Surgical History:  Procedure Laterality Date   AORTIC VALVE REPLACEMENT  07/18/2011   Procedure: AORTIC VALVE REPLACEMENT (AVR);  Surgeon: Delight Ovens, MD;  Location: [facility] OR;  Service: Open Heart Surgery;  Laterality: N/A;   CARDIAC CATHETERIZATION  2009, 07/11/11   Dr. Eldridge Dace   CORONARY ARTERY BYPASS GRAFT  07/18/2011   Procedure: CORONARY ARTERY BYPASS GRAFTING (CABG);  Surgeon: Delight Ovens, MD;  Location: [facility] OR;  Service: Open Heart Surgery;  Laterality: N/A;  times one to mammary artery, left   fem stent Right 2017   FEMORAL ARTERY - POPLITEAL ARTERY BYPASS GRAFT     KNEE ARTHROSCOPY     left   MRI     to visualize aortic valve   PERIPHERAL VASCULAR CATHETERIZATION N/A 07/12/2016   Procedure: Abdominal Aortogram w/Lower Extremity;  Surgeon: Maeola Harman, MD;  Location: [facility] INVASIVE CV LAB;  Service: Cardiovascular;  Laterality: N/A;   PERIPHERAL VASCULAR CATHETERIZATION Right 07/12/2016   Procedure: Peripheral Vascular Atherectomy;  Surgeon: Maeola Harman, MD;  Location: [facility] INVASIVE CV LAB;   Service: Cardiovascular;  Laterality: Right;  Anterior Tibial and Popliteal   ROTATOR CUFF REPAIR     Left   US ECHOCARDIOGRAPHY      Short Social History:  Social History   Tobacco Use   Smoking status: Never    Passive exposure: Never   Smokeless tobacco: Never   Tobacco comments:    Never smoked 05/18/23  Substance Use Topics   Alcohol use: No    Allergies  Allergen Reactions   Clopidogrel Itching   Sulfamethoxazole-Trimethoprim Other (See Comments)    Dizziness, flushed   Codeine Other (See Comments)    Blood pressure drops   Flexeril [Cyclobenzaprine]     sedation   Morphine And Codeine Itching    Current Outpatient Medications  Medication Sig Dispense Refill   amoxicillin (AMOXIL) 500 MG capsule TAKE 4 CAPSULES ONE HOUR BEFORE DENTAL APPOINTMENT. 8 capsule 0   apixaban (ELIQUIS) 5 MG TABS tablet Take 1 tablet (5 mg total) by mouth 2 (two) times daily. 60 tablet 5   apixaban (ELIQUIS) 5 MG TABS tablet Take 1 tablet (5 mg total) by mouth 2 (two) times daily. 60 tablet 5   atorvastatin (LIPITOR) 80 MG tablet Take one tablet (80 mg dose) by mouth daily. 90 tablet 3   Cholecalciferol (VITAMIN D-3 PO) Take 1 tablet by mouth daily.     citalopram (CELEXA) 20 MG tablet Take 20 mg by mouth daily.     Cyanocobalamin (VITAMIN B-12 PO) Take 1 tablet by mouth daily.     dapagliflozin propanediol (FARXIGA) 10 MG TABS tablet Take 1 tablet (10 mg total) by mouth daily. 90 tablet 0   dapagliflozin propanediol (FARXIGA) 5 MG TABS tablet Take 5 mg by mouth daily.     enalapril (VASOTEC) 2.5 MG tablet Take 2.5 mg by mouth daily.     ezetimibe (ZETIA) 10 MG tablet Take 1 tablet (10 mg total) by mouth daily. 90 tablet 3   fluticasone (FLONASE) 50 MCG/ACT nasal spray Place 2 sprays into both nostrils daily.     furosemide (LASIX) 20 MG tablet TAKE ONE TABLET BY MOUTH DAILY 90 tablet 0   lansoprazole (PREVACID) 15 MG capsule Take 15 mg by mouth daily as needed. For acid reflux      levETIRAcetam (KEPPRA) 500 MG tablet Take 500 mg by mouth 2 (two) times daily.     metoprolol tartrate (LOPRESSOR) 50 MG tablet TAKE 1/2 TABLET TWICE DAILY 90 tablet 2   tamsulosin (FLOMAX) 0.4 MG CAPS capsule Take 0.4 mg by mouth daily.      traMADol (ULTRAM) 50 MG tablet Take 1 tablet (50 mg total) by mouth every 8 (eight) hours as  needed. 30 tablet 0   Adalimumab 40 MG/0.8ML PNKT Inject 40 mg into the skin every 14 (fourteen) days. (Patient not taking: Reported on 08/29/2023)     No current facility-administered medications for this visit.    Review of Systems  Constitutional:  Constitutional negative. HENT: HENT negative.  Eyes: Eyes negative.  Respiratory: Respiratory negative.  Cardiovascular: Cardiovascular negative.  GI: Gastrointestinal negative.  Musculoskeletal: Musculoskeletal negative.  Skin: Skin negative.  Neurological: Neurological negative. Hematologic: Hematologic/lymphatic negative.  Psychiatric: Psychiatric negative.        Objective:  Objective   Vitals:   08/29/23 0827 08/29/23 0830  BP: 113/74 111/83  Resp: 20   Temp: 98 F (36.7 C)   SpO2: 97%   Weight: 194 lb 14.4 oz (88.4 kg)   Height: 5\' 10"  (1.778 m)    Body mass index is 27.97 kg/m.  Physical Exam HENT:     Head: Normocephalic.  Neck:     Vascular: No carotid bruit.  Cardiovascular:     Pulses:          Femoral pulses are 2+ on the right side and 2+ on the left side.      Popliteal pulses are 0 on the right side and 0 on the left side.  Pulmonary:     Effort: Pulmonary effort is normal.  Abdominal:     General: Abdomen is flat.  Musculoskeletal:        General: Normal range of motion.     Right lower leg: No edema.     Left lower leg: No edema.  Skin:    General: Skin is warm and dry.     Capillary Refill: Capillary refill takes less than 2 seconds.  Neurological:     General: No focal deficit present.     Mental Status: He is alert.  Psychiatric:        Mood and Affect: Mood  normal.        Thought Content: Thought content normal.        Judgment: Judgment normal.     Data: Right Carotid Findings:  +----------+--------+--------+--------+------------------+--------+           PSV cm/sEDV cm/sStenosisPlaque DescriptionComments  +----------+--------+--------+--------+------------------+--------+  CCA Prox  118     27                                          +----------+--------+--------+--------+------------------+--------+  CCA Mid   64      24                                          +----------+--------+--------+--------+------------------+--------+  CCA Distal61      21                                          +----------+--------+--------+--------+------------------+--------+  ICA Prox  58      19      1-39%   heterogenous                +----------+--------+--------+--------+------------------+--------+  ICA Mid   56      31                                          +----------+--------+--------+--------+------------------+--------+  ICA Distal50      22                                          +----------+--------+--------+--------+------------------+--------+  ECA      76      17              heterogenous                +----------+--------+--------+--------+------------------+--------+   +----------+--------+-------+----------------+-------------------+           PSV cm/sEDV cmsDescribe        Arm Pressure (mmHG)  +----------+--------+-------+----------------+-------------------+  [id number]            Multiphasic, WNL                     +----------+--------+-------+----------------+-------------------+   +---------+--------+--+--------+-+---------+  VertebralPSV cm/s25EDV cm/s8Antegrade  +---------+--------+--+--------+-+---------+      Left Carotid Findings:  +----------+--------+--------+--------+------------------+--------+           PSV cm/sEDV  cm/sStenosisPlaque DescriptionComments  +----------+--------+--------+--------+------------------+--------+  CCA Prox  87      26                                          +----------+--------+--------+--------+------------------+--------+  CCA Mid   68      25                                          +----------+--------+--------+--------+------------------+--------+  CCA Distal79      22                                          +----------+--------+--------+--------+------------------+--------+  ICA Prox  39      16      1-39%   heterogenous                +----------+--------+--------+--------+------------------+--------+  ICA Mid   60      31                                          +----------+--------+--------+--------+------------------+--------+  ICA Distal62      30                                          +----------+--------+--------+--------+------------------+--------+  ECA      74      22              heterogenous                +----------+--------+--------+--------+------------------+--------+   +----------+--------+--------+----------------+-------------------+           PSV cm/sEDV cm/sDescribe        Arm Pressure (mmHG)  +----------+--------+--------+----------------+-------------------+  [id number]             Multiphasic, WNL                     +----------+--------+--------+----------------+-------------------+   +---------+--------+--+--------+--+---------+  VertebralPSV cm/s28EDV cm/s12Antegrade  +---------+--------+--+--------+--+---------+         Summary:  Right Carotid: Velocities in the right ICA are consistent with a 1-39%  stenosis.   Left Carotid: Velocities in the left ICA are consistent with a 1-39%  stenosis.   Vertebrals: Bilateral vertebral arteries demonstrate antegrade flow.  Subclavians: Normal flow hemodynamics were seen in bilateral subclavian                arteries.      Assessment/Plan:    73 year old male with history as above.  Given mild disease in bilateral carotid arteries would otherwise have him follow-up in 2 years but we will have him recheck in 1 year with carotid duplex and also reevaluate right lower extremity for short distance claudication which at this time is not life limiting.  I recommended continued walking and he demonstrates good understanding.  He will follow-up in 1 year with carotid duplex, right lower extremity arterial duplex and ABIs.     Maeola Harman MD [facility]

## 2023-09-03 ENCOUNTER — Other Ambulatory Visit (HOSPITAL_COMMUNITY): Payer: Self-pay

## 2023-09-04 ENCOUNTER — Other Ambulatory Visit (HOSPITAL_COMMUNITY): Payer: Self-pay

## 2023-09-04 MED ORDER — DAPAGLIFLOZIN PROPANEDIOL 10 MG PO TABS
10.0000 mg | ORAL_TABLET | Freq: Every day | ORAL | 0 refills | Status: DC
  Filled 2023-09-04: qty 30, 30d supply, fill #0
  Filled 2023-10-04: qty 30, 30d supply, fill #1
  Filled 2023-10-20 – 2023-11-01 (×2): qty 30, 30d supply, fill #2

## 2023-09-11 NOTE — Progress Notes (Unsigned)
Office Visit Note  Patient: [name]             Date of Birth: [birth year]           MRN: [id number]             PCP: Aliene Beams, MD Referring: Aliene Beams, MD Visit Date: 09/12/2023   Subjective:  No chief complaint on file.   History of Present Illness: [name] is a 73 y.o. male here for follow up ***   Previous HPI 06/11/23 [name] is a 73 y.o. male here to establish care for psoriatic arthritis.  His medical history significant for CAD, stroke, aortic valve replacement, and paroxysmal A-fib on long-term anticoagulation. Also has chronic osteoarthritis, gout, and injury of multiple joints with previous arthroscopic repair of left shoulder rotator cuff and left knee meniscus.  He sees orthopedic surgery for this with viscosupplementation injections for his knees by Dr. Roda Shutters and shoulder with Dr. Alvester Morin.  He is transferring care from Dr. Mariah Milling in [place] to reduce his commutes. Psoriasis started since his teenage years with chronic longstanding symptoms.  Describes psoriatic plaques involving his scalp, arms, and bilateral knees treated primarily with topical steroids over time.  He was never on systemic treatment until starting Humira which was about 6 years ago.  At the time increase in joint pain and stiffness in multiple areas and extent of skin rashes.  He had occasional joint swelling but more often pain and stiffness without much visible change.  He was also receiving steroid injection for trigger finger involving the right index finger and left ring finger.  He came off the Humira since 3 to 4 months ago due to transitioning care so far he has not noticed any appreciable change in symptoms.  His shoulder pain and knee pains are still present but feels manageable with his current injections about every 6 months and takes Tylenol as needed.   No Rheumatology ROS completed.   PMFS History:  Patient Active Problem List   Diagnosis Date Noted    Psoriatic arthritis (HCC) 06/11/2023   Trigger finger, left ring finger 06/11/2023   Trigger finger, right index finger 06/11/2023   Hypercoagulable state due to paroxysmal atrial fibrillation (HCC) 04/20/2023   Thoracic aortic aneurysm (HCC) 05/09/2022   Abnormal MRI, neck 11/16/2021   Rotator cuff arthropathy of left shoulder 08/17/2021   Chronic left shoulder pain 08/17/2021   Chronic frontal sinusitis 07/07/2021   Facial paresthesia 07/07/2021   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 07/07/2021   Cervical stenosis of spinal canal 06/07/2021   Meningeal irritation 06/07/2021   Idiopathic intracranial hypotension 06/07/2021   Cervicalgia 03/07/2021   Chronic daily headache 03/07/2021   Migraine 01/10/2021   Obstructive sleep apnea 01/10/2021   Renal stones 10/04/2020   Type 2 diabetes mellitus with unspecified complications (HCC) 10/04/2020   Benign prostatic hyperplasia without lower urinary tract symptoms 09/30/2020   GERD (gastroesophageal reflux disease) 05/05/2020   Irritable bowel syndrome 02/19/2020   Dependence on other enabling machines and devices 02/19/2020   ED (erectile dysfunction) of organic origin 02/19/2020   Major depression single episode, in partial remission (HCC) 02/19/2020   Obesity 02/19/2020   Chronic right-sided low back pain without sciatica 12/16/2019   Primary osteoarthritis of left knee 09/16/2019   Rotator cuff tear arthropathy of left shoulder 09/16/2019   Primary osteoarthritis of right knee 09/16/2019   Complex tear of medial meniscus of right knee as  current injury 03/04/2019   Chronic diastolic heart failure (HCC) 11/25/2018   Bruxism (teeth grinding) 11/25/2018   Cephalalgia 11/25/2018   Cerebrovascular disease 11/25/2018   Morning headache 11/25/2018   Sleep related headaches 09/26/2018   Chronic idiopathic gout without tophus 02/13/2018   Hypertension    History of adenomatous polyp of colon 03/14/2016    Thrombosed hemorrhoids 03/01/2016   Encounter for long-term (current) use of high-risk medication 06/30/2015   Psoriasis 12/25/2014   Major depressive disorder, recurrent, in full remission (HCC) 06/22/2014   Gout 04/21/2014   Chest pain, somewhat atypical 12/28/2013   Dyspnea on exertion 12/27/2011   Dizziness 08/15/2011   Heart valve replaced by other means 07/28/2011   Paroxysmal atrial fibrillation (HCC) 07/28/2011   Aortic stenosis, severe 07/18/2011   Coronary artery disease 07/06/2011   Peripheral vascular disease (HCC) 07/06/2011   Hyperlipidemia 07/06/2011   Bicuspid aortic valve 07/06/2011    Past Medical History:  Diagnosis Date   Aortic stenosis    a. severe bicuspid AV stenosis s/p pericardial tissue valve 2012.   Arthritis    Coronary artery disease    a. s/p CABG (LIMA-LCx) at time of AVR 2012, mild nonobstructive LAD and RCA stenoses. // Myoview 03/2022: EF 48, no ischemia; low risk   Depression    Depression    Diabetes mellitus without complication (HCC)    ED (erectile dysfunction)    Encounter for long-term (current) use of high-risk medication 06/30/2015   Fatty liver    Femoral-popliteal bypass graft occlusion, right [facility]) 1997   Dr. 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   Obesity    Obstructive sleep apnea 01/10/2021   PAD (peripheral artery disease) (HCC)    PONV (postoperative nausea and vomiting)    PVD (peripheral vascular disease) (HCC)    Stroke (HCC) 03/28/2023   Thoracic aortic aneurysm (HCC) 05/09/2022   Echocardiogram 10/23: EF 55-60, no RWMA, mild LVH, normal RVSF, mild MR, s/p AVR with mild AI and normal  gradient (15 mmHg), mod dilation of aortic root and ascending aorta (45 mm).  Chest CT 10/23: Ascending thoracic aorta 4.1 cm; aortic atherosclerosis    Family History  Problem Relation Age of Onset   Hypertension Father    Diabetes Father    Lung cancer Father    Breast cancer Sister    Healthy Brother    Heart disease Maternal Grandmother    Healthy Maternal Grandfather    Cancer Maternal Grandfather    Migraines Neg Hx    Headache Neg Hx    Colon cancer Neg Hx    Pancreatic cancer Neg Hx    Stomach cancer Neg Hx    Esophageal cancer Neg Hx    Sleep apnea Neg Hx    Colon polyps Neg Hx    Rectal cancer Neg Hx    Past Surgical History:  Procedure Laterality Date   AORTIC VALVE REPLACEMENT  07/18/2011   Procedure: AORTIC VALVE REPLACEMENT (AVR);  Surgeon: Delight Ovens, MD;  Location: [facility] OR;  Service: Open  Heart Surgery;  Laterality: N/A;   CARDIAC CATHETERIZATION  2009, 07/11/11   Dr. Eldridge Dace   CORONARY ARTERY BYPASS GRAFT  07/18/2011   Procedure: CORONARY ARTERY BYPASS GRAFTING (CABG);  Surgeon: Delight Ovens, MD;  Location: [facility] OR;  Service: Open Heart Surgery;  Laterality: N/A;  times one to mammary artery, left   fem stent Right 2017   FEMORAL ARTERY - POPLITEAL ARTERY BYPASS GRAFT     KNEE ARTHROSCOPY     left   MRI     to visualize aortic valve   PERIPHERAL VASCULAR CATHETERIZATION N/A 07/12/2016   Procedure: Abdominal Aortogram w/Lower Extremity;  Surgeon: Maeola Harman, MD;  Location: [facility] INVASIVE CV LAB;  Service: Cardiovascular;  Laterality: N/A;   PERIPHERAL VASCULAR CATHETERIZATION Right 07/12/2016   Procedure: Peripheral Vascular Atherectomy;  Surgeon: Maeola Harman, MD;  Location: [facility] INVASIVE CV LAB;  Service: Cardiovascular;  Laterality: Right;  Anterior Tibial and Popliteal   ROTATOR CUFF REPAIR     Left   US ECHOCARDIOGRAPHY     Social History   Social History Narrative   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   Left handed    Caffeine: about 1 cups daily   Immunization History  Administered Date(s) Administered   Influenza Split 06/25/2017   Influenza, Seasonal, Injecte, Preservative Fre 07/02/2016   Influenza,inj,quad, With Preservative 06/14/2015, 06/25/2017   Influenza-Unspecified 06/14/2015, 06/25/2017, 08/01/2018, 08/12/2019, 05/31/2020   PFIZER(Purple Top)SARS-COV-2 Vaccination 09/28/2019, 10/28/2019   Pneumococcal Conjugate-13 06/14/2015, 06/25/2017   Pneumococcal Polysaccharide-23 06/25/2017, 06/07/2020   Pneumococcal-Unspecified 06/07/2020   Td 09/25/2003   Tdap 07/01/2015, 08/01/2018     Objective: Vital Signs: There were no vitals taken for this visit.   Physical Exam   Musculoskeletal Exam: ***  CDAI Exam: CDAI Score: -- Patient Global: --; Provider Global: -- Swollen: --; Tender: -- Joint Exam 09/12/2023   No joint exam has been documented for this visit   There is currently no information documented on the homunculus. Go to the Rheumatology activity and complete the homunculus joint exam.  Investigation: No additional findings.  Imaging: VAS US CAROTID Result Date: 08/29/2023 Carotid Arterial Duplex Study Patient Name:  TRAYQUAN KOLAKOWSKI  Date of Exam:   08/29/2023 Medical Rec #: [id number]         Accession #:    [id number] Date of Birth: 01-Sep-1950         Patient Gender: M Patient Age:   78 years Exam Location:  [facility] Procedure:      VAS US CAROTID Referring Phys: Lemar Livings --------------------------------------------------------------------------------  Indications:   Dr. Darcella Cheshire office note 04/18/23: 73 year old male with recent CVA                that was bilateral by MRI report at [facility]. He was found to have                stenosis in his right ICA by CTA I do not have these images                available but only the read in the evaluation by interventional                radiology and neurology. No intervention was undertaken at the                time  of attempted thrombectomy by interventional radiology. Given  the bilateral nature of his lesions on MRI and recent diagnosis                of atrial fibrillation we have discussed continued nonoperative                management particularly given that he was just started on Eliquis                yesterday for new diagnosis of atrial fibrillation via heart                monitor that he is wearing today. We have discussed the signs and                symptoms of stroke which she is well aware of and any further                symptoms may change our management. Short of that I will see him                back in 3 to 4 months with carotid duplex for further evaluation. Risk Factors:  Hypertension, hyperlipidemia, Diabetes, prior CVA, PAD. Other Factors: Severe aortic stenosis. Performing Technologist: Thereasa Parkin RVT  Examination Guidelines: A complete evaluation includes B-mode imaging, spectral Doppler, color Doppler, and power Doppler as needed of all accessible portions of each vessel. Bilateral testing is considered an integral part of a complete examination. Limited examinations for reoccurring indications may be performed as noted.  Right Carotid Findings: +----------+--------+--------+--------+------------------+--------+           PSV cm/sEDV cm/sStenosisPlaque DescriptionComments +----------+--------+--------+--------+------------------+--------+ CCA Prox  118     27                                         +----------+--------+--------+--------+------------------+--------+ CCA Mid   64      24                                         +----------+--------+--------+--------+------------------+--------+ CCA Distal61      21                                         +----------+--------+--------+--------+------------------+--------+ ICA Prox  58      19      1-39%   heterogenous               +----------+--------+--------+--------+------------------+--------+  ICA Mid   56      31                                         +----------+--------+--------+--------+------------------+--------+ ICA Distal50      22                                         +----------+--------+--------+--------+------------------+--------+ ECA       76      17              heterogenous               +----------+--------+--------+--------+------------------+--------+ +----------+--------+-------+----------------+-------------------+  PSV cm/sEDV cmsDescribe        Arm Pressure (mmHG) +----------+--------+-------+----------------+-------------------+ [id number]             Multiphasic, WNL                    +----------+--------+-------+----------------+-------------------+ +---------+--------+--+--------+-+---------+ VertebralPSV cm/s25EDV cm/s8Antegrade +---------+--------+--+--------+-+---------+  Left Carotid Findings: +----------+--------+--------+--------+------------------+--------+           PSV cm/sEDV cm/sStenosisPlaque DescriptionComments +----------+--------+--------+--------+------------------+--------+ CCA Prox  87      26                                         +----------+--------+--------+--------+------------------+--------+ CCA Mid   68      25                                         +----------+--------+--------+--------+------------------+--------+ CCA Distal79      22                                         +----------+--------+--------+--------+------------------+--------+ ICA Prox  39      16      1-39%   heterogenous               +----------+--------+--------+--------+------------------+--------+ ICA Mid   60      31                                         +----------+--------+--------+--------+------------------+--------+ ICA Distal62      30                                         +----------+--------+--------+--------+------------------+--------+ ECA       74      22               heterogenous               +----------+--------+--------+--------+------------------+--------+ +----------+--------+--------+----------------+-------------------+           PSV cm/sEDV cm/sDescribe        Arm Pressure (mmHG) +----------+--------+--------+----------------+-------------------+ [id number]              Multiphasic, WNL                    +----------+--------+--------+----------------+-------------------+ +---------+--------+--+--------+--+---------+ VertebralPSV cm/s28EDV cm/s12Antegrade +---------+--------+--+--------+--+---------+   Summary: Right Carotid: Velocities in the right ICA are consistent with a 1-39% stenosis. Left Carotid: Velocities in the left ICA are consistent with a 1-39% stenosis. Vertebrals:  Bilateral vertebral arteries demonstrate antegrade flow. Subclavians: Normal flow hemodynamics were seen in bilateral subclavian              arteries. *See table(s) above for measurements and observations.  Electronically signed by Lemar Livings MD on 08/29/2023 at 8:14:57 AM.    Final     Recent Labs: Lab Results  Component Value Date   WBC 6.3 05/18/2023   HGB 15.3 05/18/2023   PLT 159 05/18/2023   NA 142 10/25/2021   K 4.2 10/25/2021   CL 109 (H) 10/25/2021   CO2  18 (L) 10/25/2021   GLUCOSE 99 10/25/2021   BUN 17 10/25/2021   CREATININE 1.30 (H) 05/07/2023   BILITOT 0.5 05/12/2016   ALKPHOS 55 05/12/2016   AST 40 (H) 05/12/2016   ALT 38 05/12/2016   PROT 6.6 05/12/2016   ALBUMIN 3.9 05/12/2016   CALCIUM 9.2 10/25/2021   GFRAA 78 12/15/2016   QFTBGOLDPLUS NEGATIVE 06/11/2023    Speciality Comments: No specialty comments available.  Procedures:  No procedures performed Allergies: Clopidogrel, Sulfamethoxazole-trimethoprim, Codeine, Flexeril [cyclobenzaprine], and Morphine and codeine   Assessment / Plan:     Visit Diagnoses: No diagnosis found.  ***  Orders: No orders of the defined types were placed in this  encounter.  No orders of the defined types were placed in this encounter.    Follow-Up Instructions: No follow-ups on file.   Fuller Plan, MD  Note - This record has been created using [employer].  Chart creation errors have been sought, but may not always  have been located. Such creation errors do not reflect on  the standard of medical care.

## 2023-09-12 ENCOUNTER — Ambulatory Visit: Payer: BC Managed Care – PPO | Attending: Internal Medicine | Admitting: Internal Medicine

## 2023-09-12 ENCOUNTER — Encounter: Payer: Self-pay | Admitting: Internal Medicine

## 2023-09-12 VITALS — BP 109/75 | HR 76 | Resp 14 | Ht 72.0 in | Wt 199.0 lb

## 2023-09-12 DIAGNOSIS — G8929 Other chronic pain: Secondary | ICD-10-CM

## 2023-09-12 DIAGNOSIS — M4802 Spinal stenosis, cervical region: Secondary | ICD-10-CM | POA: Diagnosis not present

## 2023-09-12 DIAGNOSIS — M25512 Pain in left shoulder: Secondary | ICD-10-CM | POA: Diagnosis not present

## 2023-09-12 DIAGNOSIS — R200 Anesthesia of skin: Secondary | ICD-10-CM | POA: Insufficient documentation

## 2023-09-12 DIAGNOSIS — L4050 Arthropathic psoriasis, unspecified: Secondary | ICD-10-CM | POA: Diagnosis not present

## 2023-09-12 DIAGNOSIS — M65342 Trigger finger, left ring finger: Secondary | ICD-10-CM

## 2023-09-12 DIAGNOSIS — M65321 Trigger finger, right index finger: Secondary | ICD-10-CM

## 2023-09-13 ENCOUNTER — Other Ambulatory Visit: Payer: Self-pay

## 2023-09-13 DIAGNOSIS — I63239 Cerebral infarction due to unspecified occlusion or stenosis of unspecified carotid arteries: Secondary | ICD-10-CM

## 2023-09-13 DIAGNOSIS — I739 Peripheral vascular disease, unspecified: Secondary | ICD-10-CM

## 2023-09-18 ENCOUNTER — Encounter: Payer: Self-pay | Admitting: Cardiovascular Disease

## 2023-09-18 ENCOUNTER — Encounter: Payer: Self-pay | Admitting: Gastroenterology

## 2023-09-19 ENCOUNTER — Ambulatory Visit: Payer: BC Managed Care – PPO | Admitting: Cardiovascular Disease

## 2023-10-04 ENCOUNTER — Other Ambulatory Visit (HOSPITAL_COMMUNITY): Payer: Self-pay

## 2023-10-04 ENCOUNTER — Other Ambulatory Visit: Payer: Self-pay

## 2023-10-04 ENCOUNTER — Other Ambulatory Visit: Payer: Self-pay | Admitting: Cardiovascular Disease

## 2023-10-04 MED ORDER — APIXABAN 5 MG PO TABS
5.0000 mg | ORAL_TABLET | Freq: Two times a day (BID) | ORAL | 5 refills | Status: DC
  Filled 2023-10-04: qty 60, 30d supply, fill #0
  Filled 2023-10-20 – 2023-11-01 (×2): qty 60, 30d supply, fill #1
  Filled 2023-11-28: qty 60, 30d supply, fill #2
  Filled 2024-01-01: qty 60, 30d supply, fill #3
  Filled 2024-01-28: qty 60, 30d supply, fill #4
  Filled 2024-02-18 – 2024-03-03 (×2): qty 60, 30d supply, fill #5

## 2023-10-04 NOTE — Telephone Encounter (Signed)
 Prescription refill request for Eliquis received. Indication: afib  Last office visit: Nelva Bush 05/18/2023 Scr: 1.03, 04/02/2023 Age: 73 yo  Weight: 90.3 kg   Refill sent.

## 2023-10-08 ENCOUNTER — Other Ambulatory Visit (HOSPITAL_COMMUNITY): Payer: Self-pay

## 2023-10-08 ENCOUNTER — Encounter: Payer: Self-pay | Admitting: Cardiovascular Disease

## 2023-10-08 ENCOUNTER — Ambulatory Visit: Attending: Cardiovascular Disease | Admitting: Cardiovascular Disease

## 2023-10-08 VITALS — BP 110/80 | HR 80 | Ht 72.0 in | Wt 199.0 lb

## 2023-10-08 DIAGNOSIS — D6869 Other thrombophilia: Secondary | ICD-10-CM

## 2023-10-08 DIAGNOSIS — I359 Nonrheumatic aortic valve disorder, unspecified: Secondary | ICD-10-CM | POA: Diagnosis not present

## 2023-10-08 DIAGNOSIS — I2510 Atherosclerotic heart disease of native coronary artery without angina pectoris: Secondary | ICD-10-CM

## 2023-10-08 DIAGNOSIS — I480 Paroxysmal atrial fibrillation: Secondary | ICD-10-CM | POA: Diagnosis not present

## 2023-10-08 DIAGNOSIS — E782 Mixed hyperlipidemia: Secondary | ICD-10-CM

## 2023-10-08 DIAGNOSIS — I10 Essential (primary) hypertension: Secondary | ICD-10-CM

## 2023-10-08 NOTE — Assessment & Plan Note (Signed)
 Status post single-vessel CABG at the time of his heart valve replacement.  No anginal symptoms.  No aspirin as he is on oral anticoagulation.  He continues on risk reduction medicine with a high intensity statin.

## 2023-10-08 NOTE — Patient Instructions (Signed)
 Testing/Procedures: ECHO (in 1 year, prior to visit) Your physician has requested that you have an echocardiogram. Echocardiography is a painless test that uses sound waves to create images of your heart. It provides your doctor with information about the size and shape of your heart and how well your heart's chambers and valves are working. This procedure takes approximately one hour. There are no restrictions for this procedure. Please do NOT wear cologne, perfume, aftershave, or lotions (deodorant is allowed). Please arrive 15 minutes prior to your appointment time.  Please note: We ask at that you not bring children with you during ultrasound (echo/ vascular) testing. Due to room size and safety concerns, children are not allowed in the ultrasound rooms during exams. Our front office staff cannot provide observation of children in our lobby area while testing is being conducted. An adult accompanying a patient to their appointment will only be allowed in the ultrasound room at the discretion of the ultrasound technician under special circumstances. We apologize for any inconvenience.  Follow-Up: At [facility], you and your health needs are our priority.  As part of our continuing mission to provide you with exceptional heart care, we have created designated Provider Care Teams.  These Care Teams include your primary Cardiologist (physician) and Advanced Practice Providers (APPs -  Physician Assistants and Nurse Practitioners) who all work together to provide you with the care you need, when you need it.  Your next appointment:   1 year(s)  Provider:   [name], MD     1st Floor: - Lobby - Registration  - Pharmacy  - Lab - Cafe  2nd Floor: - PV Lab - Diagnostic Testing (echo, CT, nuclear med)  3rd Floor: - Vacant  4th Floor: - TCTS (cardiothoracic surgery) - AFib Clinic - Structural Heart Clinic - Vascular Surgery  - Vascular Ultrasound  5th Floor: -  [facility] (general and EP) - Clinical Pharmacy for coumadin, hypertension, lipid, weight-loss medications, and med management appointments    Valet parking services will be available as well.

## 2023-10-08 NOTE — Assessment & Plan Note (Signed)
 Tolerating apixaban without bleeding problems.

## 2023-10-08 NOTE — Progress Notes (Signed)
 Cardiology Office Note:    Date:  10/08/2023   ID:  [name], DOB 03-18-1951, MRN [id number]  PCP:  Aliene Beams, MD   [facility] Providers Cardiologist:  [name], MD     Referring MD: Aliene Beams, MD   Chief Complaint  Patient presents with   Follow-up    S/P stroke    History of Present Illness:    [name] is a 73 y.o. male with a hx of:  Severe bicuspid AS S/p bioprosthetic AVR in 2012 Coronary artery disease  S/p 1v CABG (L-LCx) in 2012 Peripheral arterial disease  S/p fem-pop bypass graft 1997 S/p atherectomy of R ant tib, R pop; angioplasty R pop in 2017 (Dr. Randie Heinz) Intermittent claudication F/u by vasc surgery  Diabetes mellitus Hypertension  Hyperlipidemia Stroke 03/2023 - treated at [facility]  The patient is here alone today.  He has been doing well from a cardiac perspective.  He denies chest pain, chest pressure, or heart palpitations.  He keeps an eye on his pulse rate with his smart watch and he has not noticed any irregularity or rapid heart rates.  He denies any bleeding problems on apixaban.  He has no specific cardiac-related complaints today.  Current Medications: Current Meds  Medication Sig   amoxicillin (AMOXIL) 500 MG capsule TAKE 4 CAPSULES ONE HOUR BEFORE DENTAL APPOINTMENT.   apixaban (ELIQUIS) 5 MG TABS tablet Take 1 tablet (5 mg total) by mouth 2 (two) times daily.   atorvastatin (LIPITOR) 80 MG tablet Take one tablet (80 mg dose) by mouth daily.   Cholecalciferol (VITAMIN D-3 PO) Take 1 tablet by mouth daily.   citalopram (CELEXA) 20 MG tablet Take 20 mg by mouth daily.   Cyanocobalamin (VITAMIN B-12 PO) Take 1 tablet by mouth daily.   dapagliflozin propanediol (FARXIGA) 10 MG TABS tablet Take 1 tablet (10 mg total) by mouth daily.   enalapril (VASOTEC) 2.5 MG tablet Take 2.5 mg by mouth daily.   ezetimibe (ZETIA) 10 MG tablet Take 1 tablet (10 mg total) by mouth daily.   fluticasone (FLONASE)  50 MCG/ACT nasal spray Place 2 sprays into both nostrils daily.   furosemide (LASIX) 20 MG tablet TAKE ONE TABLET BY MOUTH DAILY   lansoprazole (PREVACID) 15 MG capsule Take 15 mg by mouth daily as needed. For acid reflux   levETIRAcetam (KEPPRA) 500 MG tablet Take 500 mg by mouth 2 (two) times daily.   metoprolol tartrate (LOPRESSOR) 50 MG tablet TAKE 1/2 TABLET TWICE DAILY   tamsulosin (FLOMAX) 0.4 MG CAPS capsule Take 0.4 mg by mouth daily.    traMADol (ULTRAM) 50 MG tablet Take 1 tablet (50 mg total) by mouth every 8 (eight) hours as needed.   [DISCONTINUED] Adalimumab 40 MG/0.8ML PNKT Inject 40 mg into the skin every 14 (fourteen) days.   [DISCONTINUED] dapagliflozin propanediol (FARXIGA) 5 MG TABS tablet Take 5 mg by mouth daily.     Allergies:   Clopidogrel, Sulfamethoxazole-trimethoprim, Codeine, Flexeril [cyclobenzaprine], and Morphine and codeine   ROS:   Please see the history of present illness.    All other systems reviewed and are negative.  EKGs/Labs/Other Studies Reviewed:    The following studies were reviewed today: Cardiac Studies & Procedures   ______________________________________________________________________________________________   STRESS TESTS  MYOCARDIAL PERFUSION IMAGING 04/26/2022  Narrative   The study is normal. The study is low risk.   Additional 1.0 mm of down sloping ST depression in the inferior and lateral leads (I, II, III,  aVL, aVF, V5 and V6) was noted.   LV perfusion is normal.   Left ventricular function is abnormal. Nuclear stress EF: 48 %. The left ventricular ejection fraction is moderately decreased (30-44%). End diastolic cavity size is normal.   Prior study available for comparison from 01/14/2014. No changes compared to prior study.  Low risk stress nuclear study with normal perfusion. Mildly reduced left ventricular global systolic function may be artifactual due to poor gating (note frequent PACs).  Recommend correlation with  echocardiogram. Nodiagnostic ECG response to exercise due to baseline ST depression.   ECHOCARDIOGRAM  ECHOCARDIOGRAM COMPLETE 05/05/2022  Narrative ECHOCARDIOGRAM REPORT    Patient Name:   [name] Date of Exam: 05/05/2022 Medical Rec #:  [id number]        Height:       72.0 in Accession #:    [id number]       Weight:       221.0 lb Date of Birth:  29-Oct-1950        BSA:          2.223 m Patient Age:    71 years         BP:           122/82 mmHg Patient Gender: M                HR:           77 bpm. Exam Location:  [address]  Procedure: 2D Echo, Cardiac Doppler, Color Doppler and Intracardiac Opacification Agent  Indications:    Aortic Stenosis I35.0  History:        Patient has prior history of Echocardiogram examinations, most recent 03/28/2021. Prior CABG; Risk Factors:Hypertension, Diabetes and Dyslipidemia. Aortic Valve: 23 mm Edwards bioprosthetic valve is present in the aortic position.  Sonographer:    Thurman Coyer RDCS Referring Phys: [id number] Ever[address] [employer] WEAVER  IMPRESSIONS   1. S/P AVR with mild AI and normal mean gradient of 15 mmHg). 2. Left ventricular ejection fraction, by estimation, is 55 to 60%. The left ventricle has normal function. The left ventricle has no regional wall motion abnormalities. There is mild concentric left ventricular hypertrophy. Left ventricular diastolic parameters are indeterminate. 3. Right ventricular systolic function is normal. The right ventricular size is normal. Tricuspid regurgitation signal is inadequate for assessing PA pressure. 4. The mitral valve is normal in structure. Mild mitral valve regurgitation. No evidence of mitral stenosis. 5. The aortic valve has been repaired/replaced. Aortic valve regurgitation is mild. No aortic stenosis is present. There is a 23 mm Edwards bioprosthetic valve present in the aortic position. 6. Aortic dilatation noted. There is moderate dilatation of the aortic root and of the  ascending aorta, measuring 45 mm. 7. The inferior vena cava is normal in size with greater than 50% respiratory variability, suggesting right atrial pressure of 3 mmHg.  FINDINGS Left Ventricle: Left ventricular ejection fraction, by estimation, is 55 to 60%. The left ventricle has normal function. The left ventricle has no regional wall motion abnormalities. Definity contrast agent was given IV to delineate the left ventricular endocardial borders. The left ventricular internal cavity size was normal in size. There is mild concentric left ventricular hypertrophy. Abnormal (paradoxical) septal motion consistent with post-operative status. Left ventricular diastolic parameters are indeterminate.  Right Ventricle: The right ventricular size is normal. Right ventricular systolic function is normal. Tricuspid regurgitation signal is inadequate for assessing PA pressure. The tricuspid regurgitant velocity is 2.01 m/s, and with an  assumed right atrial pressure of 3 mmHg, the estimated right ventricular systolic pressure is 19.2 mmHg.  Left Atrium: Left atrial size was normal in size.  Right Atrium: Right atrial size was normal in size.  Pericardium: There is no evidence of pericardial effusion.  Mitral Valve: The mitral valve is normal in structure. Mild mitral annular calcification. Mild mitral valve regurgitation. No evidence of mitral valve stenosis.  Tricuspid Valve: The tricuspid valve is normal in structure. Tricuspid valve regurgitation is trivial. No evidence of tricuspid stenosis.  Aortic Valve: The aortic valve has been repaired/replaced. Aortic valve regurgitation is mild. No aortic stenosis is present. Aortic valve mean gradient measures 14.6 mmHg. Aortic valve peak gradient measures 26.1 mmHg. Aortic valve area, by VTI measures 1.13 cm. There is a 23 mm Edwards bioprosthetic valve present in the aortic position.  Pulmonic Valve: The pulmonic valve was normal in structure. Pulmonic  valve regurgitation is trivial. No evidence of pulmonic stenosis.  Aorta: Aortic dilatation noted. There is moderate dilatation of the aortic root and of the ascending aorta, measuring 45 mm.  Venous: The inferior vena cava is normal in size with greater than 50% respiratory variability, suggesting right atrial pressure of 3 mmHg.  IAS/Shunts: No atrial level shunt detected by color flow Doppler.  Additional Comments: S/P AVR with mild AI and normal mean gradient of 15 mmHg).   LEFT VENTRICLE PLAX 2D LVIDd:         4.50 cm LVIDs:         2.90 cm LV PW:         1.10 cm LV IVS:        1.20 cm LVOT diam:     2.10 cm LV SV:         62 LV SV Index:   28 LVOT Area:     3.46 cm   RIGHT VENTRICLE RV Basal diam:  3.00 cm RV Mid diam:    2.40 cm RV S prime:     7.83 cm/s TAPSE (M-mode): 1.2 cm  LEFT ATRIUM             Index        RIGHT ATRIUM           Index LA diam:        4.20 cm 1.89 cm/m   RA Area:     13.40 cm LA Vol (A2C):   73.1 ml 32.88 ml/m  RA Volume:   28.50 ml  12.82 ml/m LA Vol (A4C):   50.9 ml 22.90 ml/m LA Biplane Vol: 64.2 ml 28.88 ml/m AORTIC VALVE AV Area (Vmax):    1.21 cm AV Area (Vmean):   1.17 cm AV Area (VTI):     1.13 cm AV Vmax:           255.40 cm/s AV Vmean:          175.400 cm/s AV VTI:            0.547 m AV Peak Grad:      26.1 mmHg AV Mean Grad:      14.6 mmHg LVOT Vmax:         89.10 cm/s LVOT Vmean:        59.200 cm/s LVOT VTI:          0.178 m LVOT/AV VTI ratio: 0.33  AORTA Ao Root diam: 3.10 cm Ao Asc diam:  4.25 cm  MITRAL VALVE  TRICUSPID VALVE MV Area (PHT): 2.66 cm    TR Peak grad:   16.2 mmHg MV Decel Time: 285 msec    TR Vmax:        201.00 cm/s MV E velocity: 66.30 cm/s MV A velocity: 76.80 cm/s  SHUNTS MV E/A ratio:  0.86        Systemic VTI:  0.18 m Systemic Diam: 2.10 cm  Olga Millers MD Electronically signed by Olga Millers MD Signature Date/Time: 05/09/2022/3:17:59 PM    Final     MONITORS  CARDIAC EVENT MONITOR 05/08/2023  Narrative The basic rhythm is normal sinus with an average HR of 80 bpm There is a brief episode of atrial fibrillation with an overall burden of less than 1% No high-grade heart block or pathologic pauses There are occasional PVCs with an isolated ventricular run of 9 beats       ______________________________________________________________________________________________      EKG:        Recent Labs: 05/07/2023: Creatinine, Ser 1.30 05/18/2023: Hemoglobin 15.3; Platelets 159  Recent Lipid Panel    Component Value Date/Time   CHOL 122 03/21/2018 0755   TRIG 138 03/21/2018 0755   HDL 42 03/21/2018 0755   CHOLHDL 2.9 03/21/2018 0755   CHOLHDL 4.5 05/12/2016 0854   VLDL 28 05/12/2016 0854   LDLCALC 52 03/21/2018 0755     Risk Assessment/Calculations:    CHA2DS2-VASc Score = 6   This indicates a 9.7% annual risk of stroke. The patient's score is based upon: CHF History: 0 HTN History: 1 Diabetes History: 1 Stroke History: 2 Vascular Disease History: 1 Age Score: 1 Gender Score: 0               Physical Exam:    VS:  BP 110/80   Pulse 80   Ht 6' (1.829 m)   Wt 199 lb (90.3 kg)   SpO2 97%   BMI 26.99 kg/m     Wt Readings from Last 3 Encounters:  10/08/23 199 lb (90.3 kg)  09/12/23 199 lb (90.3 kg)  08/29/23 194 lb 14.4 oz (88.4 kg)     GEN:  Well nourished, well developed in no acute distress HEENT: Normal NECK: No JVD; No carotid bruits LYMPHATICS: No lymphadenopathy CARDIAC: RRR, 2/6 early peaking systolic ejection murmur at the right upper sternal border RESPIRATORY:  Clear to auscultation without rales, wheezing or rhonchi  ABDOMEN: Soft, non-tender, non-distended MUSCULOSKELETAL:  No edema; No deformity  SKIN: Warm and dry NEUROLOGIC:  Alert and oriented x 3 PSYCHIATRIC:  Normal affect   Assessment & Plan Aortic valve disease I reviewed the patient's most recent echocardiogram from last  summer when he was hospitalized and he had normal function of his aortic prosthesis.  When I see him back next year, I would like him to have an echocardiogram before that visit.  He is about 13 years out from his surgery.  He continues with SBE prophylaxis when indicated. Paroxysmal atrial fibrillation (HCC) Appears to be maintaining sinus rhythm, treated with metoprolol and anticoagulated with apixaban. Hypercoagulable state due to paroxysmal atrial fibrillation (HCC) Tolerating apixaban without bleeding problems. Coronary artery disease involving native coronary artery of native heart without angina pectoris Status post single-vessel CABG at the time of his heart valve replacement.  No anginal symptoms.  No aspirin as he is on oral anticoagulation.  He continues on risk reduction medicine with a high intensity statin. Primary hypertension Blood pressure is well-controlled on metoprolol and enalapril Mixed hyperlipidemia Treated with atorvastatin  and ezetimibe.  LDL cholesterol is 58.  Continue current management.     Medication Adjustments/Labs and Tests Ordered: Current medicines are reviewed at length with the patient today.  Concerns regarding medicines are outlined above.  Orders Placed This Encounter  Procedures   ECHOCARDIOGRAM COMPLETE   No orders of the defined types were placed in this encounter.   Patient Instructions  Testing/Procedures: ECHO (in 1 year, prior to visit) Your physician has requested that you have an echocardiogram. Echocardiography is a painless test that uses sound waves to create images of your heart. It provides your doctor with information about the size and shape of your heart and how well your heart's chambers and valves are working. This procedure takes approximately one hour. There are no restrictions for this procedure. Please do NOT wear cologne, perfume, aftershave, or lotions (deodorant is allowed). Please arrive 15 minutes prior to your  appointment time.  Please note: We ask at that you not bring children with you during ultrasound (echo/ vascular) testing. Due to room size and safety concerns, children are not allowed in the ultrasound rooms during exams. Our front office staff cannot provide observation of children in our lobby area while testing is being conducted. An adult accompanying a patient to their appointment will only be allowed in the ultrasound room at the discretion of the ultrasound technician under special circumstances. We apologize for any inconvenience.  Follow-Up: At [facility], you and your health needs are our priority.  As part of our continuing mission to provide you with exceptional heart care, we have created designated Provider Care Teams.  These Care Teams include your primary Cardiologist (physician) and Advanced Practice Providers (APPs -  Physician Assistants and Nurse Practitioners) who all work together to provide you with the care you need, when you need it.  Your next appointment:   1 year(s)  Provider:   [name], MD      1st Floor: - Lobby - Registration  - Pharmacy  - Lab - Cafe  2nd Floor: - PV Lab - Diagnostic Testing (echo, CT, nuclear med)  3rd Floor: - Vacant  4th Floor: - TCTS (cardiothoracic surgery) - AFib Clinic - Structural Heart Clinic - Vascular Surgery  - Vascular Ultrasound  5th Floor: - HeartCare Cardiology (general and EP) - Clinical Pharmacy for coumadin, hypertension, lipid, weight-loss medications, and med management appointments    Valet parking services will be available as well.     Signed, [name], MD  10/08/2023 12:59 PM    [facility]

## 2023-10-08 NOTE — Assessment & Plan Note (Signed)
 Treated with atorvastatin and ezetimibe.  LDL cholesterol is 58.  Continue current management.

## 2023-10-08 NOTE — Assessment & Plan Note (Signed)
 Blood pressure is well-controlled on metoprolol and enalapril

## 2023-10-08 NOTE — Assessment & Plan Note (Signed)
 Appears to be maintaining sinus rhythm, treated with metoprolol and anticoagulated with apixaban.

## 2023-10-09 DIAGNOSIS — R6889 Other general symptoms and signs: Secondary | ICD-10-CM | POA: Diagnosis not present

## 2023-10-09 DIAGNOSIS — E118 Type 2 diabetes mellitus with unspecified complications: Secondary | ICD-10-CM | POA: Diagnosis not present

## 2023-10-09 DIAGNOSIS — E782 Mixed hyperlipidemia: Secondary | ICD-10-CM | POA: Diagnosis not present

## 2023-10-09 DIAGNOSIS — R569 Unspecified convulsions: Secondary | ICD-10-CM | POA: Diagnosis not present

## 2023-10-16 ENCOUNTER — Encounter: Payer: Self-pay | Admitting: Physician Assistant

## 2023-10-19 DIAGNOSIS — Z8673 Personal history of transient ischemic attack (TIA), and cerebral infarction without residual deficits: Secondary | ICD-10-CM | POA: Diagnosis not present

## 2023-10-19 DIAGNOSIS — I63331 Cerebral infarction due to thrombosis of right posterior cerebral artery: Secondary | ICD-10-CM | POA: Diagnosis not present

## 2023-10-20 ENCOUNTER — Other Ambulatory Visit (HOSPITAL_COMMUNITY): Payer: Self-pay

## 2023-10-20 ENCOUNTER — Other Ambulatory Visit: Payer: Self-pay | Admitting: Physician Assistant

## 2023-11-05 DIAGNOSIS — K219 Gastro-esophageal reflux disease without esophagitis: Secondary | ICD-10-CM | POA: Diagnosis not present

## 2023-11-05 DIAGNOSIS — J309 Allergic rhinitis, unspecified: Secondary | ICD-10-CM | POA: Diagnosis not present

## 2023-11-16 ENCOUNTER — Ambulatory Visit (HOSPITAL_COMMUNITY): Payer: BC Managed Care – PPO | Admitting: Internal Medicine

## 2023-11-19 ENCOUNTER — Ambulatory Visit (HOSPITAL_COMMUNITY)
Admission: RE | Admit: 2023-11-19 | Discharge: 2023-11-19 | Disposition: A | Discharge status: Home / Self Care | Payer: BC Managed Care – PPO | Source: Ambulatory Visit | Attending: Internal Medicine | Admitting: Internal Medicine

## 2023-11-19 VITALS — BP 104/76 | HR 80 | Ht 72.0 in | Wt 198.0 lb

## 2023-11-19 DIAGNOSIS — E1151 Type 2 diabetes mellitus with diabetic peripheral angiopathy without gangrene: Secondary | ICD-10-CM | POA: Insufficient documentation

## 2023-11-19 DIAGNOSIS — D6869 Other thrombophilia: Secondary | ICD-10-CM | POA: Insufficient documentation

## 2023-11-19 DIAGNOSIS — Z8673 Personal history of transient ischemic attack (TIA), and cerebral infarction without residual deficits: Secondary | ICD-10-CM | POA: Insufficient documentation

## 2023-11-19 DIAGNOSIS — G4733 Obstructive sleep apnea (adult) (pediatric): Secondary | ICD-10-CM | POA: Diagnosis not present

## 2023-11-19 DIAGNOSIS — I10 Essential (primary) hypertension: Secondary | ICD-10-CM | POA: Diagnosis not present

## 2023-11-19 DIAGNOSIS — K219 Gastro-esophageal reflux disease without esophagitis: Secondary | ICD-10-CM | POA: Diagnosis not present

## 2023-11-19 DIAGNOSIS — E785 Hyperlipidemia, unspecified: Secondary | ICD-10-CM | POA: Insufficient documentation

## 2023-11-19 DIAGNOSIS — Z7984 Long term (current) use of oral hypoglycemic drugs: Secondary | ICD-10-CM | POA: Diagnosis not present

## 2023-11-19 DIAGNOSIS — Z7901 Long term (current) use of anticoagulants: Secondary | ICD-10-CM | POA: Insufficient documentation

## 2023-11-19 DIAGNOSIS — Z79899 Other long term (current) drug therapy: Secondary | ICD-10-CM | POA: Diagnosis not present

## 2023-11-19 DIAGNOSIS — I480 Paroxysmal atrial fibrillation: Secondary | ICD-10-CM | POA: Diagnosis not present

## 2023-11-19 NOTE — Progress Notes (Signed)
 Primary Care Physician: Dorena Gander, MD Primary Cardiologist: Arnoldo Lapping, MD Electrophysiologist: None     Referring Physician: [name] is a 72 y.o. male with a history of T2DM, HTN, HLD, PAD with history of femoral-popliteal bypass graft occlusion, GERD, OSA on CPAP, bicuspid aortic valve, aortic stenosis s/p valve replacement 2012, CAD s/p CABG 2012, CVA 8/24, and paroxysmal atrial fibrillation who presents for consultation in the [name]. Seen by Dr. Arlester Ladd on 9/5 and with recent CVA placed 30-day monitor; alert for new onset Afib with RVR. Discontinued plavix . Patient is on Eliquis  for a CHADS2VASC score of 6.  On evaluation today, he is currently in NSR. He has no bleeding issues on Eliquis . He has no cardiac awareness of arrhythmia; he was out walking when he received the phone call noting he was in Afib with RVR. He is currently still wearing the 30-day monitor.   On follow up 05/18/23, he is currently in NSR. He has had no episodes of Afib since last OV. Event monitor worn showed <1% Afib burden. He feels well overall and walks 2 miles daily. No bleeding issues on Eliquis .   On follow up 11/19/23, he is currently in NSR. He has had no episodes of Afib since last office visit. He checks his Apple watch 1-2 times a day. He admits he should be walking more. No bleeding issues on Eliquis .   Today, he denies symptoms of palpitations, chest pain, shortness of breath, orthopnea, PND, lower extremity edema, dizziness, presyncope, syncope, snoring, daytime somnolence, bleeding, or neurologic sequela. The patient is tolerating medications without difficulties and is otherwise without complaint today.    Atrial Fibrillation Risk Factors:  he does have symptoms or diagnosis of sleep apnea. he is compliant with CPAP therapy.  he has a BMI of Body mass index is 26.85 kg/m.[name] Filed Weights   11/19/23 0839  Weight: 89.8 kg       Current Outpatient Medications  Medication Sig Dispense Refill   amoxicillin  (AMOXIL ) 500 MG capsule TAKE 4 CAPSULES ONE HOUR BEFORE DENTAL APPOINTMENT. 8 capsule 0   apixaban  (ELIQUIS ) 5 MG TABS tablet Take 1 tablet (5 mg total) by mouth 2 (two) times daily. 60 tablet 5   atorvastatin  (LIPITOR) 80 MG tablet Take one tablet (80 mg dose) by mouth daily. 90 tablet 3   cetirizine (ZYRTEC) 10 MG tablet 1 tablet Orally Once a day for 90 days     Cholecalciferol (VITAMIN D-3 PO) Take 1 tablet by mouth daily.     citalopram  (CELEXA ) 20 MG tablet Take 20 mg by mouth daily.     Cyanocobalamin  (VITAMIN B-12 PO) Take 1 tablet by mouth daily.     dapagliflozin  propanediol (FARXIGA ) 10 MG TABS tablet Take 1 tablet (10 mg total) by mouth daily. 90 tablet 0   enalapril  (VASOTEC ) 2.5 MG tablet Take 2.5 mg by mouth daily.     ezetimibe  (ZETIA ) 10 MG tablet Take 1 tablet (10 mg total) by mouth daily. 90 tablet 3   fluticasone (FLONASE) 50 MCG/ACT nasal spray Place 2 sprays into both nostrils daily.     furosemide  (LASIX ) 20 MG tablet TAKE ONE TABLET BY MOUTH DAILY 90 tablet 3   lansoprazole (PREVACID) 15 MG capsule Take 15 mg by mouth daily as needed. For acid reflux     levETIRAcetam (KEPPRA) 500 MG tablet Take 500 mg by mouth 2 (two) times daily.     metoprolol  tartrate (  LOPRESSOR ) 50 MG tablet TAKE 1/2 TABLET TWICE DAILY 90 tablet 2   tamsulosin (FLOMAX) 0.4 MG CAPS capsule Take 0.4 mg by mouth daily.      traMADol  (ULTRAM ) 50 MG tablet Take 1 tablet (50 mg total) by mouth every 8 (eight) hours as needed. 30 tablet 0   No current facility-administered medications for this encounter.    Atrial Fibrillation Management history:  Previous antiarrhythmic drugs: None Previous cardioversions: None Previous ablations: None Anticoagulation history: Eliquis    ROS- All systems are reviewed and negative except as per the HPI above.  Physical Exam: BP 104/76   Pulse 80   Ht 6' (1.829 m)   Wt 89.8  kg   BMI 26.85 kg/m   GEN- The patient is well appearing, alert and oriented x 3 today.   Neck - no JVD or carotid bruit noted Lungs- Clear to ausculation bilaterally, normal work of breathing Heart- Regular rate and rhythm, no murmurs, rubs or gallops, PMI not laterally displaced Extremities- no clubbing, cyanosis, or edema Skin - no rash or ecchymosis noted   EKG today demonstrates  Vent. rate 80 BPM PR interval 196 ms QRS duration 86 ms QT/QTcB 374/431 ms P-R-T axes 57 60 144 Sinus rhythm with marked sinus arrhythmia Cannot rule out Anterior infarct , age undetermined Abnormal ECG When compared with ECG of 18-May-2023 08:46, PREVIOUS ECG IS PRESENT  Echo 05/05/22 demonstrated   1. S/P AVR with mild AI and normal mean gradient of 15 mmHg).   2. Left ventricular ejection fraction, by estimation, is 55 to 60%. The  left ventricle has normal function. The left ventricle has no regional  wall motion abnormalities. There is mild concentric left ventricular  hypertrophy. Left ventricular diastolic  parameters are indeterminate.   3. Right ventricular systolic function is normal. The right ventricular  size is normal. Tricuspid regurgitation signal is inadequate for assessing  PA pressure.   4. The mitral valve is normal in structure. Mild mitral valve  regurgitation. No evidence of mitral stenosis.   5. The aortic valve has been repaired/replaced. Aortic valve  regurgitation is mild. No aortic stenosis is present. There is a 23 mm  Edwards bioprosthetic valve present in the aortic position.   6. Aortic dilatation noted. There is moderate dilatation of the aortic  root and of the ascending aorta, measuring 45 mm.   7. The inferior vena cava is normal in size with greater than 50%  respiratory variability, suggesting right atrial pressure of 3 mmHg.   ASSESSMENT & PLAN CHA2DS2-VASc Score = 6  The patient's score is based upon: CHF History: 0 HTN History: 1 Diabetes History:  1 Stroke History: 2 Vascular Disease History: 1 Age Score: 1 Gender Score: 0      ASSESSMENT AND PLAN: Paroxysmal Atrial Fibrillation (ICD10:  I48.0) The patient's CHA2DS2-VASc score is 6, indicating a 9.7% annual risk of stroke.    He is currently in NSR. He appears to have low burden overall so will continue conservative observation via Apple watch. Continue Lopressor  25 mg BID.  Secondary Hypercoagulable State (ICD10:  D68.69) The patient is at significant risk for stroke/thromboembolism based upon his CHA2DS2-VASc Score of 6.  Continue Apixaban  (Eliquis ).  No missed doses.     Follow up 6 months Afib clinic.    Minnie Amber, PA-C  Afib Clinic [facility] [address] [place], [place] [address] [phone]

## 2023-11-21 ENCOUNTER — Other Ambulatory Visit: Payer: Self-pay | Admitting: Orthopedic Surgery

## 2023-11-28 ENCOUNTER — Other Ambulatory Visit (HOSPITAL_COMMUNITY): Payer: Self-pay

## 2023-11-28 MED ORDER — DAPAGLIFLOZIN PROPANEDIOL 10 MG PO TABS
10.0000 mg | ORAL_TABLET | Freq: Every day | ORAL | 0 refills | Status: DC
Start: 2023-11-28 — End: 2024-03-05
  Filled 2023-11-28: qty 30, 30d supply, fill #0
  Filled 2024-01-01: qty 30, 30d supply, fill #1
  Filled 2024-01-28: qty 30, 30d supply, fill #2

## 2023-12-07 ENCOUNTER — Ambulatory Visit: Admitting: Physician Assistant

## 2023-12-07 ENCOUNTER — Encounter: Payer: Self-pay | Admitting: Physician Assistant

## 2023-12-07 VITALS — BP 98/62 | HR 87 | Ht 72.0 in | Wt 202.0 lb

## 2023-12-07 DIAGNOSIS — Z7901 Long term (current) use of anticoagulants: Secondary | ICD-10-CM

## 2023-12-07 DIAGNOSIS — I480 Paroxysmal atrial fibrillation: Secondary | ICD-10-CM

## 2023-12-07 DIAGNOSIS — Z8673 Personal history of transient ischemic attack (TIA), and cerebral infarction without residual deficits: Secondary | ICD-10-CM

## 2023-12-07 DIAGNOSIS — Z8679 Personal history of other diseases of the circulatory system: Secondary | ICD-10-CM

## 2023-12-07 DIAGNOSIS — Z860101 Personal history of adenomatous and serrated colon polyps: Secondary | ICD-10-CM

## 2023-12-07 MED ORDER — NA SULFATE-K SULFATE-MG SULF 17.5-3.13-1.6 GM/177ML PO SOLN: 1.0000 | Freq: Once | ORAL | 0 refills | Status: AC

## 2023-12-07 NOTE — Patient Instructions (Signed)
 You have been scheduled for a colonoscopy. Please follow written instructions given to you at your visit today.   If you use inhalers (even only as needed), please bring them with you on the day of your procedure.  DO NOT TAKE 7 DAYS PRIOR TO TEST- Trulicity (dulaglutide) Ozempic, Wegovy (semaglutide) Mounjaro (tirzepatide) Bydureon Bcise (exanatide extended release)  DO NOT TAKE 1 DAY PRIOR TO YOUR TEST Rybelsus (semaglutide) Adlyxin (lixisenatide) Victoza (liraglutide) Byetta (exanatide) ________________________________________________________________________  _______________________________________________________  If your blood pressure at your visit was 140/90 or greater, please contact your primary care physician to follow up on this.  _______________________________________________________  If you are age 46 or older, your body mass index should be between 23-30. Your Body mass index is 27.4 kg/m. If this is out of the aforementioned range listed, please consider follow up with your Primary Care Provider.  If you are age 71 or younger, your body mass index should be between 19-25. Your Body mass index is 27.4 kg/m. If this is out of the aformentioned range listed, please consider follow up with your Primary Care Provider.   ________________________________________________________  The [facility] providers would like to encourage you to use MYCHART to communicate with providers for non-urgent requests or questions.  Due to long hold times on the telephone, sending your provider a message by [facility] may be a faster and more efficient way to get a response.  Please allow 48 business hours for a response.  Please remember that this is for non-urgent requests.  _______________________________________________________

## 2023-12-07 NOTE — Progress Notes (Signed)
 Chief Complaint: Discuss Colonoscopy  HPI:    [name] is a 73 year old male with a past medical history as listed below including CAD, depression, A-fib on Eliquis  and history of stroke in August 2024 on Plavix , GERD and multiple others, known to Dr. Brice Campi, who was referred to me by Dorena Gander, MD for consideration of a colonoscopy.    09/15/22 colonoscopy with Dr. Brice Campi done for personal history of adenomatous polyps with hemorrhoids, 11 to-12 mm polyps in the sigmoid, descending, transverse, hepatic and ascending colon.  Nonbleeding nonthrombosed external and internal hemorrhoids.  Repeat recommended in a year due to number of polyps.    11/19/23 patient saw cardiology and at that time continued on Eliquis .  He has history of A-fib and was in normal sinus rhythm.  History of stroke back in August 2024 and also on Plavix .    Today, patient presents to clinic and tells me that he is aware that he needs another colonoscopy.  He like to go ahead and schedule this.  He is currently on Eliquis  and Plavix .  The Plavix  is not listed on his med list.  He denies any acute GI complaints or concerns.    Denies fever, chills, weight loss or blood in the stool.  Past Medical History:  Diagnosis Date   Aortic stenosis    a. severe bicuspid AV stenosis s/p pericardial tissue valve 2012.   Arthritis    Coronary artery disease    a. s/p CABG (LIMA-LCx) at time of AVR 2012, mild nonobstructive LAD and RCA stenoses. // Myoview  03/2022: EF 48, no ischemia; low risk   Depression    Depression    Diabetes mellitus without complication [facility])    ED (erectile dysfunction)    Encounter for long-term (current) use of high-risk medication 06/30/2015   Fatty liver    Femoral-popliteal bypass graft occlusion, right [facility]) 1997   Dr. 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   Obesity    Obstructive sleep apnea 01/10/2021   PAD (peripheral artery disease) (HCC)    PONV (postoperative nausea and vomiting)    PVD (peripheral vascular disease) (HCC)    Stroke (HCC) 03/28/2023   Thoracic aortic aneurysm (HCC) 05/09/2022   Echocardiogram 10/23: EF 55-60, no RWMA, mild LVH, normal RVSF, mild MR, s/p AVR with mild AI and normal gradient (15 mmHg), mod dilation of aortic root and ascending aorta (45 mm).  Chest CT 10/23: Ascending thoracic aorta 4.1 cm; aortic atherosclerosis    Past Surgical History:  Procedure Laterality Date   AORTIC VALVE REPLACEMENT  07/18/2011   Procedure: AORTIC VALVE REPLACEMENT (AVR);  Surgeon: Norita Beauvais, MD;  Location: [facility] OR;  Service: Open Heart Surgery;  Laterality: N/A;   CARDIAC CATHETERIZATION  2009, 07/11/11   Dr. Jacquelynn Matter   CORONARY ARTERY BYPASS GRAFT  07/18/2011   Procedure: CORONARY ARTERY BYPASS GRAFTING (CABG);  Surgeon: Norita Beauvais, MD;  Location: [facility] OR;  Service: Open Heart Surgery;  Laterality: N/A;  times one to mammary artery, left  fem stent Right 2017   FEMORAL ARTERY - POPLITEAL ARTERY BYPASS GRAFT     KNEE ARTHROSCOPY     left   MRI     to visualize aortic valve   PERIPHERAL VASCULAR CATHETERIZATION N/A 07/12/2016   Procedure: Abdominal Aortogram w/Lower Extremity;  Surgeon: Adine Hoof, MD;  Location: [facility] INVASIVE CV LAB;  Service: Cardiovascular;  Laterality: N/A;   PERIPHERAL VASCULAR CATHETERIZATION Right 07/12/2016   Procedure: Peripheral Vascular Atherectomy;  Surgeon: Adine Hoof, MD;  Location: [facility] INVASIVE CV LAB;  Service: Cardiovascular;  Laterality: Right;  Anterior Tibial and Popliteal   ROTATOR CUFF REPAIR     Left   US  ECHOCARDIOGRAPHY      Current  Outpatient Medications  Medication Sig Dispense Refill   amoxicillin  (AMOXIL ) 500 MG capsule TAKE 4 CAPSULES ONE HOUR BEFORE DENTAL APPOINTMENT. 8 capsule 0   apixaban  (ELIQUIS ) 5 MG TABS tablet Take 1 tablet (5 mg total) by mouth 2 (two) times daily. 60 tablet 5   atorvastatin  (LIPITOR) 80 MG tablet Take one tablet (80 mg dose) by mouth daily. 90 tablet 3   cetirizine (ZYRTEC) 10 MG tablet 1 tablet Orally Once a day for 90 days     Cholecalciferol (VITAMIN D-3 PO) Take 1 tablet by mouth daily.     citalopram  (CELEXA ) 20 MG tablet Take 20 mg by mouth daily.     Cyanocobalamin  (VITAMIN B-12 PO) Take 1 tablet by mouth daily.     dapagliflozin  propanediol (FARXIGA ) 10 MG TABS tablet Take 1 tablet (10 mg total) by mouth daily. 90 tablet 0   enalapril  (VASOTEC ) 2.5 MG tablet Take 2.5 mg by mouth daily.     ezetimibe  (ZETIA ) 10 MG tablet Take 1 tablet (10 mg total) by mouth daily. 90 tablet 3   fluticasone (FLONASE) 50 MCG/ACT nasal spray Place 2 sprays into both nostrils daily.     furosemide  (LASIX ) 20 MG tablet TAKE ONE TABLET BY MOUTH DAILY 90 tablet 3   lansoprazole (PREVACID) 15 MG capsule Take 15 mg by mouth daily as needed. For acid reflux     levETIRAcetam (KEPPRA) 500 MG tablet Take 500 mg by mouth 2 (two) times daily.     metoprolol  tartrate (LOPRESSOR ) 50 MG tablet TAKE 1/2 TABLET TWICE DAILY 90 tablet 2   tamsulosin (FLOMAX) 0.4 MG CAPS capsule Take 0.4 mg by mouth daily.      traMADol  (ULTRAM ) 50 MG tablet Take 1 tablet (50 mg total) by mouth every 8 (eight) hours as needed. 30 tablet 0   No current facility-administered medications for this visit.    Allergies as of 12/07/2023 - Review Complete 12/07/2023  Allergen Reaction Noted   Clopidogrel  Itching 08/18/2016   Sulfamethoxazole-trimethoprim Other (See Comments) 09/26/2018   Codeine Other (See Comments) 07/06/2011   Flexeril [cyclobenzaprine]  09/26/2018   Morphine  and codeine Itching 07/14/2011    Family History  Problem  Relation Age of Onset   Hypertension Father    Diabetes Father    Lung cancer Father    Breast cancer Sister    Healthy Brother    Heart disease Maternal Grandmother    Healthy Maternal Grandfather    Cancer Maternal Grandfather    Liver disease Neg Hx    Colon cancer Neg Hx    Esophageal cancer Neg Hx     Social History   Socioeconomic History   Marital status: Divorced    Spouse name: Not on file   Number of children: 4  Years of education: Not on file   Highest education level: Bachelor's degree (e.g., BA, AB, BS)  Occupational History   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[employer]   Tobacco Use   Smoking status: Never    Passive exposure: Never   Smokeless tobacco: Never   Tobacco comments:    Never smoked 05/18/23  Vaping Use   Vaping status: Never Used  Substance and Sexual Activity   Alcohol use: No   Drug use: No   Sexual activity: Not on file  Other Topics Concern   Not on file  Social History Narrative   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   Left handed   Caffeine: about 1 cups daily   Social Drivers of Health   Financial Resource Strain: Low Risk  (03/30/2023)   Received from [facility]   Overall Financial Resource Strain (CARDIA)    Difficulty of Paying Living Expenses: Not hard at all  Food Insecurity: No Food Insecurity (10/08/2023)   Received from [facility]   Hunger Vital Sign    Worried About Running Out of Food in the Last Year: Never true    Ran Out of Food in the Last Year: Never true  Transportation Needs: No Transportation Needs (10/08/2023)   Received from [facility] - Transportation    Lack of Transportation (Medical): No    Lack of Transportation (Non-Medical): No  Physical Activity: Sufficiently Active (10/08/2023)   Received from [facility]   Exercise Vital Sign    Days of Exercise per Week: 6 days    Minutes of Exercise per Session: 150+ min  Stress: No Stress Concern Present (10/08/2023)   Received  from [facility] - Occupational Stress Questionnaire    Feeling of Stress : Not at all  Social Connections: Moderately Integrated (10/08/2023)   Received from [facility]   Social Network    How would you rate your social network (family, work, friends)?: Adequate participation with social networks  Intimate Partner Violence: Not At Risk (10/08/2023)   Received from [facility]   HITS    Over the last 12 months how often did your partner physically hurt you?: Never    Over the last 12 months how often did your partner insult you or talk down to you?: Patient declined    Over the last 12 months how often did your partner threaten you with physical harm?: Patient declined    Over the last 12 months how often did your partner scream or curse at you?: Patient declined    Review of Systems:    Constitutional: No weight loss, fever or chills Cardiovascular: No chest pain Respiratory: No SOB  Gastrointestinal: See HPI and otherwise negative   Physical Exam:  Vital signs: BP 98/62   Pulse 87   Ht 6' (1.829 m)   Wt 202 lb (91.6 kg)   BMI 27.40 kg/m    Constitutional:   Pleasant Caucasian male appears to be in NAD, Well developed, Well nourished, alert and cooperative Respiratory: Respirations even and unlabored. Lungs clear to auscultation bilaterally.   No wheezes, crackles, or rhonchi.  Cardiovascular: Normal S1, S2. No MRG. Regular rate and rhythm. No peripheral edema, cyanosis or pallor.  Gastrointestinal:  Soft, nondistended, nontender. No rebound or guarding. Normal bowel sounds. No appreciable masses or hepatomegaly. Rectal:  Not performed.  Psychiatric: Oriented to person, place and time. Demonstrates good judgement and reason without abnormal  affect or behaviors.  RELEVANT LABS AND IMAGING: CBC    Component Value Date/Time   WBC 6.3 05/18/2023 0906   RBC 4.90 05/18/2023 0906   HGB 15.3 05/18/2023 0906   HCT 47.6 05/18/2023  0906   PLT 159 05/18/2023 0906   MCV 97.1 05/18/2023 0906   MCH 31.2 05/18/2023 0906   MCHC 32.1 05/18/2023 0906   RDW 13.0 05/18/2023 0906   LYMPHSABS 2.8 07/06/2011 1452   MONOABS 0.7 07/06/2011 1452   EOSABS 0.1 07/06/2011 1452   BASOSABS 0.0 07/06/2011 1452    CMP     Component Value Date/Time   NA 142 10/25/2021 1037   K 4.2 10/25/2021 1037   CL 109 (H) 10/25/2021 1037   CO2 18 (L) 10/25/2021 1037   GLUCOSE 99 10/25/2021 1037   GLUCOSE 112 (H) 07/12/2016 0717   BUN 17 10/25/2021 1037   CREATININE 1.30 (H) 05/07/2023 0821   CREATININE 1.20 04/14/2016 1029   CALCIUM  9.2 10/25/2021 1037   PROT 6.6 05/12/2016 0854   ALBUMIN  3.9 05/12/2016 0854   AST 40 (H) 05/12/2016 0854   ALT 38 05/12/2016 0854   ALKPHOS 55 05/12/2016 0854   BILITOT 0.5 05/12/2016 0854   GFRNONAA 67 12/15/2016 0857   GFRAA 78 12/15/2016 0857    Assessment: 1.  History of adenomatous polyps: Last colonoscopy a year ago with 11 polyps, repeat recommended now 2.  History of stroke: Currently on Plavix  3.  History of A-fib: Currently on Eliquis   Plan: 1.  Patient will be scheduled for repeat colonoscopy given history of adenomatous polyps with Dr. Brice Campi in the [facility].  Did provide the patient with a detailed list of risks for the procedure and he agrees to proceed. Patient is appropriate for endoscopic procedure(s) in the ambulatory ([facility]) setting.  2.  Patient was advised that we would need to hold his Eliquis  for 2 days and his Plavix  for 5 days.  We will communicate with his prescribing physicians to ensure this is acceptable for him.  We are going to try his cardiologist first though it looks like they only think he is on Eliquis .  His neurologist may be giving him his Plavix  given stroke. 3.  Patient to follow in clinic per recommendations after time of procedure.  Reginal Capra, PA-C [facility] 12/07/2023, 1:47 PM  Cc: Dorena Gander, MD

## 2023-12-08 NOTE — Progress Notes (Signed)
 Attending Physician's Attestation   I have reviewed the chart.   I agree with the Advanced Practitioner's note, impression, and recommendations with any updates as below. As long as we get approval from his cardiologist neurologist for hold of anticoagulation and antiplatelet therapy, we can move forward with scheduling his procedure.   Yong Henle, MD [facility] Advanced Endoscopy Office # [phone]

## 2023-12-10 ENCOUNTER — Telehealth: Payer: Self-pay | Admitting: *Deleted

## 2023-12-10 DIAGNOSIS — G96198 Other disorders of meninges, not elsewhere classified: Secondary | ICD-10-CM | POA: Diagnosis not present

## 2023-12-10 NOTE — Telephone Encounter (Signed)
-----   Message from Graciella Lavender sent at 12/10/2023  2:35 PM EDT ----- Regarding: FW: please review Can you make sure this gets saved to the patient's chart.  You will have to show me how to do that later.  Thanks, JL L ----- Message ----- From: Rogena Class, CRNA Sent: 12/10/2023  12:00 PM EDT To: Graciella Lavender, PA Subject: RE: please review                              [facility],  This pt is cleared for anesthetic care at [facility].  Thanks,  Cathryn Cobb ----- Message ----- From: Graciella Lavender, [place] Sent: 12/07/2023   2:05 PM EDT To: Rogena Class, CRNA Subject: please review                                  Ok for [facility]?  Thanks-JLL

## 2023-12-20 ENCOUNTER — Encounter: Payer: Self-pay | Admitting: *Deleted

## 2023-12-24 NOTE — Progress Notes (Unsigned)
 PATIENT: [name] DOB: [birth year]  REASON FOR VISIT: follow up HISTORY FROM: patient PRIMARY NEUROLOGIST: Dr. Albertina Hugger  No chief complaint on file.    HISTORY OF PRESENT ILLNESS: Today 12/24/23:  [name] is a 73 y.o. male with a history of OSA on CPAP. Returns today for follow-up.        12/19/22: [name] is a 73 y.o. male with a history of OSA on CPAP. Returns today for follow-up.  Reports that CPAP is working well. Denies any new issues. DL is below.        REVIEW OF SYSTEMS: Out of a complete 14 system review of symptoms, the patient complains only of the following symptoms, and all other reviewed systems are negative.  ESS 5  ALLERGIES: Allergies  Allergen Reactions   Clopidogrel  Itching   Sulfamethoxazole-Trimethoprim Other (See Comments)    Dizziness, flushed   Codeine Other (See Comments)    Blood pressure drops   Flexeril [Cyclobenzaprine]     sedation   Morphine  And Codeine Itching    HOME MEDICATIONS:    PAST MEDICAL HISTORY: Past Medical History:  Diagnosis Date   Aortic stenosis    a. severe bicuspid AV stenosis s/p pericardial tissue valve 2012.   Arthritis    Coronary artery disease    a. s/p CABG (LIMA-LCx) at time of AVR 2012, mild nonobstructive LAD and RCA stenoses. // Myoview  03/2022: EF 48, no ischemia; low risk   Depression    Depression    Diabetes mellitus without complication [facility])    ED (erectile dysfunction)    Encounter for long-term (current) use of high-risk medication 06/30/2015   Fatty liver    Femoral-popliteal bypass graft occlusion, right [facility]) 1997   Dr. 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   Obesity    Obstructive sleep apnea 01/10/2021   PAD (peripheral artery disease) (HCC)    PONV (postoperative nausea and vomiting)    PVD (peripheral vascular disease) (HCC)    Stroke (HCC) 03/28/2023   Thoracic aortic aneurysm (HCC) 05/09/2022   Echocardiogram 10/23: EF 55-60, no RWMA, mild LVH, normal RVSF, mild MR, s/p AVR with mild AI and normal gradient (15 mmHg), mod dilation of aortic root and ascending aorta (45 mm).  Chest CT 10/23: Ascending thoracic aorta 4.1 cm; aortic atherosclerosis    PAST SURGICAL HISTORY: Past Surgical History:  Procedure Laterality Date   AORTIC VALVE REPLACEMENT  07/18/2011   Procedure: AORTIC VALVE REPLACEMENT (AVR);  Surgeon: Norita Beauvais, MD;  Location: [facility] OR;  Service: Open Heart Surgery;  Laterality: N/A;   CARDIAC CATHETERIZATION  2009, 07/11/11   Dr. Jacquelynn Matter   CORONARY ARTERY BYPASS GRAFT  07/18/2011   Procedure: CORONARY ARTERY BYPASS GRAFTING (CABG);  Surgeon: Norita Beauvais, MD;  Location: [facility] OR;  Service: Open Heart Surgery;  Laterality: N/A;  times one to mammary artery, left   fem stent Right 2017   FEMORAL ARTERY - POPLITEAL ARTERY BYPASS GRAFT     KNEE ARTHROSCOPY  left   MRI     to visualize aortic valve   PERIPHERAL VASCULAR CATHETERIZATION N/A 07/12/2016   Procedure: Abdominal Aortogram w/Lower Extremity;  Surgeon: Adine Hoof, MD;  Location: [facility] INVASIVE CV LAB;  Service: Cardiovascular;  Laterality: N/A;   PERIPHERAL VASCULAR CATHETERIZATION Right 07/12/2016   Procedure: Peripheral Vascular Atherectomy;  Surgeon: Adine Hoof, MD;  Location: [facility] INVASIVE CV LAB;  Service: Cardiovascular;  Laterality: Right;  Anterior Tibial and Popliteal   ROTATOR CUFF REPAIR     Left   US  ECHOCARDIOGRAPHY      FAMILY HISTORY: Family History  Problem Relation Age of Onset   Hypertension Father    Diabetes Father    Lung cancer Father    Breast cancer Sister     Healthy Brother    Heart disease Maternal Grandmother    Healthy Maternal Grandfather    Cancer Maternal Grandfather    Liver disease Neg Hx    Colon cancer Neg Hx    Esophageal cancer Neg Hx     SOCIAL HISTORY: Social History   Socioeconomic History   Marital status: Divorced    Spouse name: Not on file   Number of children: 4   Years of education: Not on file   Highest education level: Bachelor's degree (e.g., BA, AB, BS)  Occupational History   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[employer]   Tobacco Use   Smoking status: Never    Passive exposure: Never   Smokeless tobacco: Never   Tobacco comments:    Never smoked 05/18/23  Vaping Use   Vaping status: Never Used  Substance and Sexual Activity   Alcohol use: No   Drug use: No   Sexual activity: Not on file  Other Topics Concern   Not on file  Social History Narrative   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   Left handed   Caffeine: about 1 cups daily   Social Drivers of Health   Financial Resource Strain: Low Risk  (03/30/2023)   Received from [facility]   Overall Financial Resource Strain (CARDIA)    Difficulty of Paying Living Expenses: Not hard at all  Food Insecurity: No Food Insecurity (10/08/2023)   Received from [facility]   Hunger Vital Sign    Worried About Running Out of Food in the Last Year: Never true    Ran Out of Food in the Last Year: Never true  Transportation Needs: No Transportation Needs (10/08/2023)   Received from [facility] - Transportation    Lack of Transportation (Medical): No    Lack of Transportation (Non-Medical): No  Physical Activity: Sufficiently Active (10/08/2023)   [facility]   Exercise Vital Sign    Days of Exercise per Week: 6 days    Minutes of Exercise per Session: 150+ min  Stress: No Stress Concern Present (10/08/2023)   Received from [facility]ational [facility] - Occupational Stress Questionnaire     Feeling of Stress : Not at all  Social Connections: Moderately Integrated (10/08/2023)   Received from [facility]   Social Network    How would you rate your social network (family, work, friends)?: Adequate participation with social networks  Intimate Partner Violence: Not At Risk (10/08/2023)   Received from [facility]   HITS    Over the last 12 months how often did your partner physically hurt you?: Never    Over the last  12 months how often did your partner insult you or talk down to you?: Patient declined    Over the last 12 months how often did your partner threaten you with physical harm?: Patient declined    Over the last 12 months how often did your partner scream or curse at you?: Patient declined      PHYSICAL EXAM  There were no vitals filed for this visit.  There is no height or weight on file to calculate BMI.  Generalized: Well developed, in no acute distress  Chest: Lungs clear to auscultation bilaterally  Neurological examination  Mentation: Alert oriented to time, place, history taking. Follows all commands speech and language fluent Cranial nerve II-XII: Facial Symmetry noted  DIAGNOSTIC DATA (LABS, IMAGING, TESTING) - I reviewed patient records, labs, notes, testing and imaging myself where available.  Lab Results  Component Value Date   WBC 6.3 05/18/2023   HGB 15.3 05/18/2023   HCT 47.6 05/18/2023   MCV 97.1 05/18/2023   PLT 159 05/18/2023      Component Value Date/Time   NA 142 10/25/2021 1037   K 4.2 10/25/2021 1037   CL 109 (H) 10/25/2021 1037   CO2 18 (L) 10/25/2021 1037   GLUCOSE 99 10/25/2021 1037   GLUCOSE 112 (H) 07/12/2016 0717   BUN 17 10/25/2021 1037   CREATININE 1.30 (H) 05/07/2023 0821   CREATININE 1.20 04/14/2016 1029   CALCIUM  9.2 10/25/2021 1037   PROT 6.6 05/12/2016 0854   ALBUMIN  3.9 05/12/2016 0854   AST 40 (H) 05/12/2016 0854   ALT 38 05/12/2016 0854   ALKPHOS 55 05/12/2016 0854   BILITOT 0.5 05/12/2016 0854    GFRNONAA 67 12/15/2016 0857   GFRAA 78 12/15/2016 0857   Lab Results  Component Value Date   CHOL 122 03/21/2018   HDL 42 03/21/2018   LDLCALC 52 03/21/2018   TRIG 138 03/21/2018   CHOLHDL 2.9 03/21/2018   Lab Results  Component Value Date   HGBA1C 5.8 (H) 07/14/2011   No results found for: "VITAMINB12"     ASSESSMENT AND PLAN 73 y.o. year old male  has a past medical history of Aortic stenosis, Arthritis, Coronary artery disease, Depression, Depression, Diabetes mellitus without complication (HCC), ED (erectile dysfunction), Encounter for long-term (current) use of high-risk medication (06/30/2015), Fatty liver, Femoral-popliteal bypass graft occlusion, right (HCC) (1997), GERD (gastroesophageal reflux disease), Gout, H/O migraine, Headache(784.0), Heart murmur, Hematuria, History of colonic polyps, Hyperlipemia, mixed, Hyperlipidemia, Hypertension, Irritable bowel syndrome (02/19/2020), Migraines, Near syncope, Obesity, Obstructive sleep apnea (01/10/2021), PAD (peripheral artery disease) (HCC), PONV (postoperative nausea and vomiting), PVD (peripheral vascular disease) (HCC), Stroke (HCC) (03/28/2023), and Thoracic aortic aneurysm (HCC) (05/09/2022). here with:  OSA on CPAP  - CPAP compliance excellent - Good treatment of AHI  - Encourage patient to use CPAP nightly and > 4 hours each night - F/U in 1 year or sooner if needed     Clem Currier, MSN, NP-C 12/24/2023, 8:13 AM [facility] [address], [address] [place], [place] [address] [phone]

## 2023-12-25 ENCOUNTER — Ambulatory Visit: Payer: BC Managed Care – PPO | Admitting: Adult Health

## 2023-12-25 ENCOUNTER — Encounter: Admitting: Gastroenterology

## 2023-12-25 ENCOUNTER — Encounter: Payer: Self-pay | Admitting: Adult Health

## 2023-12-25 VITALS — BP 118/77 | HR 80 | Ht 72.0 in | Wt 207.0 lb

## 2023-12-25 DIAGNOSIS — G4733 Obstructive sleep apnea (adult) (pediatric): Secondary | ICD-10-CM

## 2023-12-25 NOTE — Patient Instructions (Signed)
 Continue using CPAP nightly and greater than 4 hours each night Will repeat home sleep test If your symptoms worsen or you develop new symptoms please let us  know.

## 2023-12-31 ENCOUNTER — Encounter: Payer: Self-pay | Admitting: Gastroenterology

## 2024-01-02 ENCOUNTER — Ambulatory Visit: Admitting: Orthopedic Surgery

## 2024-01-02 ENCOUNTER — Encounter: Payer: Self-pay | Admitting: Orthopedic Surgery

## 2024-01-02 ENCOUNTER — Other Ambulatory Visit: Payer: Self-pay

## 2024-01-02 DIAGNOSIS — G8929 Other chronic pain: Secondary | ICD-10-CM | POA: Diagnosis not present

## 2024-01-02 DIAGNOSIS — M1712 Unilateral primary osteoarthritis, left knee: Secondary | ICD-10-CM

## 2024-01-02 DIAGNOSIS — M19012 Primary osteoarthritis, left shoulder: Secondary | ICD-10-CM

## 2024-01-02 DIAGNOSIS — M25512 Pain in left shoulder: Secondary | ICD-10-CM | POA: Diagnosis not present

## 2024-01-02 DIAGNOSIS — M170 Bilateral primary osteoarthritis of knee: Secondary | ICD-10-CM

## 2024-01-02 DIAGNOSIS — M1711 Unilateral primary osteoarthritis, right knee: Secondary | ICD-10-CM

## 2024-01-02 MED ORDER — BUPIVACAINE HCL 0.5 % IJ SOLN
9.0000 mL | INTRAMUSCULAR | Status: AC | PRN
  Administered 2024-01-02: 9 mL via INTRA_ARTICULAR

## 2024-01-02 MED ORDER — LIDOCAINE HCL 1 % IJ SOLN
5.0000 mL | INTRAMUSCULAR | Status: AC | PRN
  Administered 2024-01-02: 5 mL

## 2024-01-02 NOTE — Progress Notes (Signed)
 Office Visit Note   Patient: [name]           Date of Birth: November 16, 1950           MRN: [id number] Visit Date: 01/02/2024 Requested by: Dorena Gander, MD [phone] [address] [place],  [place] [address] PCP: Dorena Gander, MD  Subjective: Chief Complaint  Patient presents with   Right Shoulder - Pain   Left Shoulder - Pain    HPI: [name] is a 73 y.o. male who presents to the office reporting continued bilateral shoulder pain.  Has known history of bilateral rotator cuff arthropathy left worse than right.  He is reporting a little bit of stinging pain on the right-hand side but in general the left shoulder bothers him more.  Recently saw his cardiologist in March.  General consensus was to try to wait this out a little bit longer.  I do think reverse replacement is still on the table if these injections stop working..                ROS: All systems reviewed are negative as they relate to the chief complaint within the history of present illness.  Patient denies fevers or chills.  Assessment & Plan: Visit Diagnoses:  1. Primary osteoarthritis of right knee   2. Primary osteoarthritis of left knee   3. Bilateral primary osteoarthritis of knee     Plan: Impression is bilateral shoulder arthritis and rotator cuff arthropathy.  Plan is left shoulder injection today under ultrasound guidance.  We could repeat that in about 4 months if he gives him relief.  Will let him get a little bit farther out from his stroke event.  Follow-up as needed.  Follow-Up Instructions: No follow-ups on file.   Orders:  No orders of the defined types were placed in this encounter.  No orders of the defined types were placed in this encounter.     Procedures: Large Joint Inj: L glenohumeral on 01/02/2024 8:35 AM Indications: diagnostic evaluation and pain Details: 22 G 1.5 in needle, ultrasound-guided posterior approach  Arthrogram: No  Medications: 9 mL bupivacaine  0.5  %; 5 mL lidocaine  1 % Outcome: tolerated well, no immediate complications Procedure, treatment alternatives, risks and benefits explained, specific risks discussed. Consent was given by the patient. Immediately prior to procedure a time out was called to verify the correct patient, procedure, equipment, support staff and site/side marked as required. Patient was prepped and draped in the usual sterile fashion.    Kenalog  injected   Clinical Data: No additional findings.  Objective: Vital Signs: There were no vitals taken for this visit.  Physical Exam:  Constitutional: Patient appears well-developed HEENT:  Head: Normocephalic Eyes:EOM are normal Neck: Normal range of motion Cardiovascular: Normal rate Pulmonary/chest: Effort normal Neurologic: Patient is alert Skin: Skin is warm Psychiatric: Patient has normal mood and affect  Ortho Exam: Ortho exam demonstrates forward flexion and abduction on the right above 90 degrees.  On the left he has forward flexion abduction to about 65 degrees.  Does have some external rotation weakness more on the left than the right but subscap strength is intact bilaterally.  Some coarseness and grinding is present on that left-hand side with passive range of motion.  Deltoid does fire.  Motor or sensory function to the left hand is intact.  Specialty Comments:  No specialty comments available.  Imaging: No results found.   PMFS History: Patient Active Problem List   Diagnosis  Date Noted   Finger numbness 09/12/2023   Psoriatic arthritis (HCC) 06/11/2023   Trigger finger, left ring finger 06/11/2023   Trigger finger, right index finger 06/11/2023   Hypercoagulable state due to paroxysmal atrial fibrillation (HCC) 04/20/2023   Thoracic aortic aneurysm (HCC) 05/09/2022   Abnormal MRI, neck 11/16/2021   Rotator cuff arthropathy of left shoulder 08/17/2021   Chronic left shoulder pain 08/17/2021   Chronic frontal sinusitis 07/07/2021   Facial  paresthesia 07/07/2021   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 07/07/2021   Cervical stenosis of spinal canal 06/07/2021   Meningeal irritation 06/07/2021   Idiopathic intracranial hypotension 06/07/2021   Cervicalgia 03/07/2021   Chronic daily headache 03/07/2021   Migraine 01/10/2021   Obstructive sleep apnea 01/10/2021   Renal stones 10/04/2020   Type 2 diabetes mellitus with unspecified complications (HCC) 10/04/2020   Benign prostatic hyperplasia without lower urinary tract symptoms 09/30/2020   GERD (gastroesophageal reflux disease) 05/05/2020   Irritable bowel syndrome 02/19/2020   Dependence on other enabling machines and devices 02/19/2020   ED (erectile dysfunction) of organic origin 02/19/2020   Major depression single episode, in partial remission (HCC) 02/19/2020   Obesity 02/19/2020   Chronic right-sided low back pain without sciatica 12/16/2019   Primary osteoarthritis of left knee 09/16/2019   Rotator cuff tear arthropathy of left shoulder 09/16/2019   Primary osteoarthritis of right knee 09/16/2019   Complex tear of medial meniscus of right knee as current injury 03/04/2019   Chronic diastolic heart failure (HCC) 11/25/2018   Bruxism (teeth grinding) 11/25/2018   Cephalalgia 11/25/2018   Cerebrovascular disease 11/25/2018   Morning headache 11/25/2018   Sleep related headaches 09/26/2018   Chronic idiopathic gout without tophus 02/13/2018   Hypertension    History of adenomatous polyp of colon 03/14/2016   Thrombosed hemorrhoids 03/01/2016   Encounter for long-term (current) use of high-risk medication 06/30/2015   Psoriasis 12/25/2014   Major depressive disorder, recurrent, in full remission (HCC) 06/22/2014   Gout 04/21/2014   Chest pain, somewhat atypical 12/28/2013   Dyspnea on exertion 12/27/2011   Dizziness 08/15/2011   Heart valve replaced by other means 07/28/2011   Paroxysmal atrial fibrillation (HCC) 07/28/2011   Aortic  stenosis, severe 07/18/2011   Coronary artery disease 07/06/2011   Peripheral vascular disease (HCC) 07/06/2011   Hyperlipidemia 07/06/2011   Bicuspid aortic valve 07/06/2011   Past Medical History:  Diagnosis Date   Aortic stenosis    a. severe bicuspid AV stenosis s/p pericardial tissue valve 2012.   Arthritis    Coronary artery disease    a. s/p CABG (LIMA-LCx) at time of AVR 2012, mild nonobstructive LAD and RCA stenoses. // Myoview  03/2022: EF 48, no ischemia; low risk   Depression    Depression    Diabetes mellitus without complication [facility])    ED (erectile dysfunction)    Encounter for long-term (current) use of high-risk medication 06/30/2015   Fatty liver    Femoral-popliteal bypass graft occlusion, right [facility]) 1997   Dr. 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   Obesity    Obstructive sleep apnea 01/10/2021   PAD (peripheral artery disease) (HCC)    PONV (postoperative nausea and vomiting)    PVD (peripheral vascular disease) (HCC)    Stroke (HCC) 03/28/2023   Thoracic aortic aneurysm (HCC) 05/09/2022   Echocardiogram 10/23: EF 55-60, no RWMA, mild LVH, normal RVSF, mild MR, s/p AVR with mild AI and normal gradient (15 mmHg), mod dilation of aortic root and ascending aorta (45 mm).  Chest CT 10/23: Ascending thoracic aorta 4.1 cm; aortic atherosclerosis    Family History  Problem Relation Age of Onset   Hypertension Father    Diabetes Father    Lung cancer Father    Breast cancer Sister    Healthy Brother    Heart disease Maternal Grandmother    Healthy Maternal Grandfather    Cancer Maternal Grandfather    Liver disease Neg Hx     Colon cancer Neg Hx    Esophageal cancer Neg Hx     Past Surgical History:  Procedure Laterality Date   AORTIC VALVE REPLACEMENT  07/18/2011   Procedure: AORTIC VALVE REPLACEMENT (AVR);  Surgeon: Norita Beauvais, MD;  Location: [facility] OR;  Service: Open Heart Surgery;  Laterality: N/A;   CARDIAC CATHETERIZATION  2009, 07/11/11   Dr. Jacquelynn Matter   CORONARY ARTERY BYPASS GRAFT  07/18/2011   Procedure: CORONARY ARTERY BYPASS GRAFTING (CABG);  Surgeon: Norita Beauvais, MD;  Location: [facility] OR;  Service: Open Heart Surgery;  Laterality: N/A;  times one to mammary artery, left   fem stent Right 2017   FEMORAL ARTERY - POPLITEAL ARTERY BYPASS GRAFT     KNEE ARTHROSCOPY     left   MRI     to visualize aortic valve   PERIPHERAL VASCULAR CATHETERIZATION N/A 07/12/2016   Procedure: Abdominal Aortogram w/Lower Extremity;  Surgeon: Adine Hoof, MD;  Location: [facility] INVASIVE CV LAB;  Service: Cardiovascular;  Laterality: N/A;   PERIPHERAL VASCULAR CATHETERIZATION Right 07/12/2016   Procedure: Peripheral Vascular Atherectomy;  Surgeon: Adine Hoof, MD;  Location: [facility] INVASIVE CV LAB;  Service: Cardiovascular;  Laterality: Right;  Anterior Tibial and Popliteal   ROTATOR CUFF REPAIR     Left   US  ECHOCARDIOGRAPHY     Social History   Occupational History   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[employer]   Tobacco Use   Smoking status: Never    Passive exposure: Never   Smokeless tobacco: Never   Tobacco comments:    Never smoked 05/18/23  Vaping Use   Vaping status: Never Used  Substance and Sexual Activity   Alcohol use: No   Drug use: No   Sexual activity: Not on file

## 2024-01-04 ENCOUNTER — Telehealth: Payer: Self-pay | Admitting: *Deleted

## 2024-01-04 ENCOUNTER — Telehealth: Payer: Self-pay | Admitting: Physician Assistant

## 2024-01-04 NOTE — Telephone Encounter (Signed)
 Informed patient did not have Plavix  clearance from Dr. Godaman and would need to reschedule. Patient voiced understanding. Patient rescheduled for 01/25/24 @ 3 pm

## 2024-01-04 NOTE — Telephone Encounter (Signed)
 Patient with diagnosis of afib on Eliquis  for anticoagulation.    Procedure: colonoscopy Date of procedure: 01/08/24   CHA2DS2-VASc Score = 6   This indicates a 9.7% annual risk of stroke. The patient's score is based upon: CHF History: 0 HTN History: 1 Diabetes History: 1 Stroke History: 2 Vascular Disease History: 1 Age Score: 1 Gender Score: 0      CrCl 88.6 ml/min Platelet count 159  Patient has not had an Afib/aflutter ablation within the last 3 months or DCCV within the last 30 days  Stroke was > 6 months ago  Per office protocol, patient can hold Eliquis  for 2 days prior to procedure.    **This guidance is not considered finalized until pre-operative APP has relayed final recommendations.**

## 2024-01-04 NOTE — Telephone Encounter (Signed)
   Patient Name: [name]  DOB: 04-16-1951 MRN: [id number]  Primary Cardiologist: Arnoldo Lapping, MD  Clinical pharmacists have reviewed the patient's past medical history, labs, and current medications as part of preoperative protocol coverage. The following recommendations have been made:  Patient with diagnosis of afib on Eliquis  for anticoagulation.     Procedure: colonoscopy Date of procedure: 01/08/24     CHA2DS2-VASc Score = 6   This indicates a 9.7% annual risk of stroke. The patient's score is based upon: CHF History: 0 HTN History: 1 Diabetes History: 1 Stroke History: 2 Vascular Disease History: 1 Age Score: 1 Gender Score: 0     CrCl 88.6 ml/min Platelet count 159   Patient has not had an Afib/aflutter ablation within the last 3 months or DCCV within the last 30 days   Stroke was > 6 months ago   Per office protocol, patient can hold Eliquis  for 2 days prior to procedure. Please resume Eliquis  as soon as possible postprocedure, at the discretion of the surgeon.    I will route this recommendation to the requesting party via Epic fax function and remove from pre-op pool.  Please call with questions.  Jude Norton, NP 01/04/2024, 12:33 PM

## 2024-01-04 NOTE — Telephone Encounter (Signed)
 Faxed clearance request to Dr. Pauline Bos.

## 2024-01-04 NOTE — Telephone Encounter (Signed)
 [facility] Pre-operative Risk Assessment     Request for surgical clearance:     Endoscopy Procedure  What type of surgery is being performed?     colonoscopy  When is this surgery scheduled?     01/08/24  What type of clearance is required ?   Pharmacy  Are there any medications that need to be held prior to surgery and how long? Eliquis  2 days  Practice name and name of physician performing surgery?      [facility]  What is your office phone and fax number?      Phone- [phone]  Fax- [phone]  Anesthesia type (None, local, MAC, general) ?       MAC   Please route your response to Monita Anon, CMA

## 2024-01-04 NOTE — Telephone Encounter (Signed)
 Patient called and stated that he was returning a call back to [facility]. Patient is requesting a call back. Please advise.

## 2024-01-07 NOTE — Telephone Encounter (Signed)
 Called and left message at Dr. Pauline Bos office for a call back.

## 2024-01-08 ENCOUNTER — Encounter: Admitting: Gastroenterology

## 2024-01-08 ENCOUNTER — Telehealth: Payer: Self-pay | Admitting: Cardiovascular Disease

## 2024-01-08 NOTE — Telephone Encounter (Signed)
 Pt c/o Shortness Of Breath: STAT if SOB developed within the last 24 hours or pt is noticeably SOB on the phone  1. Are you currently SOB (can you hear that pt is SOB on the phone)? No- only when he is out doing something  2. How long have you been experiencing SOB? A week- he thinks he after he has an episode of irritable bowel syndrome, this trigger the shortness of breath, tired , sleep, feel awful  3. Are you SOB when sitting or when up moving around? When moving around  4. Are you currently experiencing any other symptoms? no

## 2024-01-08 NOTE — Telephone Encounter (Signed)
 Patient identification verified by 2 forms. Seth Duck, RN     Called and spoke to patient  Patient states:  - shortness of breath is off and on.  - more noticeble with he is doing activities. Its relieved on its on.  - blood pressure 144/87 HR 88 TODAY.  - has headache currently  but has not experienced this during the past week. Attributed this to a possible sinus headache  - he has reached out to his PCP and endocrinologist as he has a colonoscopy coming up.    Patient denies:  - chest pain, dizziness, vision changes, muscle weakness,              Interventions/Plan: - Recommended patient continue follow up with PCP regarding irritable bowel syndrome flare up.  - Encounter forwarded to primary cardiologist and nurse for review.    Reviewed ED warning signs/precautions  Patient agrees with plan, no questions at this time

## 2024-01-09 DIAGNOSIS — K589 Irritable bowel syndrome without diarrhea: Secondary | ICD-10-CM | POA: Diagnosis not present

## 2024-01-09 DIAGNOSIS — B370 Candidal stomatitis: Secondary | ICD-10-CM | POA: Diagnosis not present

## 2024-01-09 DIAGNOSIS — K219 Gastro-esophageal reflux disease without esophagitis: Secondary | ICD-10-CM | POA: Diagnosis not present

## 2024-01-09 NOTE — Telephone Encounter (Signed)
 Left message for Dr. Pauline Bos CMA to call me back.

## 2024-01-09 NOTE — Telephone Encounter (Signed)
 Per Buelah Carmel please faxed clearance request to [phone]. Clearance faxed again.

## 2024-01-10 ENCOUNTER — Telehealth: Payer: Self-pay | Admitting: Physician Assistant

## 2024-01-10 NOTE — Telephone Encounter (Signed)
 Per Dr. Pauline Bos patient is cleared from a neurology stand point to proceed with colonoscopy. Per Dr. Gomadam he does not prescribe Plavix . Spoke with patient and he is unsure who prescribes his Plavix .

## 2024-01-10 NOTE — Telephone Encounter (Signed)
 Inbound call from patient, would like to speak with a nurse. Patient states he believes he is having an IBS flare up and has been having diarrhea. Patient would like to know what he can take for relief, he states he does not want to take imodium due to secondary effects it has on him.

## 2024-01-11 NOTE — Telephone Encounter (Signed)
 Spoke  with the pt and he tells me that about 2 weeks ago he developed loose stools but only 1 episode daily. No bleeding, mucous or other.  No abd pain or cramping other than just before the BM.  He has a colon already set up and will keep that as planned.  He was taking metamucil and a supplement called Hydrocil.  He stopped those after the loose stools began.  He does not want to take imodium.  He has not added any new foods or other to his diet and does not believe he has been around any sick contacts.  No recent abx use.  He has been advised to try adding some fiber to his diet. I will also send to [facility] for review.

## 2024-01-14 ENCOUNTER — Telehealth: Payer: Self-pay | Admitting: Cardiovascular Disease

## 2024-01-14 ENCOUNTER — Other Ambulatory Visit: Payer: Self-pay

## 2024-01-14 NOTE — Telephone Encounter (Signed)
 Patient called back and confirmed he is only taking Eliquis .

## 2024-01-14 NOTE — Telephone Encounter (Signed)
 Spoke with patient and he will confirm that he is still on Plavix  and call back with the prescriber tomorrow (6/17)

## 2024-01-14 NOTE — Progress Notes (Signed)
 D/c plavix  04/2023

## 2024-01-14 NOTE — Telephone Encounter (Signed)
 Pt c/o medication issue:  1. Name of Medication: clopidogrel  (PLAVIX ) 75 MG tablet   2. How are you currently taking this medication (dosage and times per day)? Discontinued   3. Are you having a reaction (difficulty breathing--STAT)? No   4. What is your medication issue? Pt called in stating he was having some confusion looking at his med list via mychart. Last September Dr. Arlester Ladd told him to stop taking Plavix  and start Eliquis  (04/17/23). He asked if someone can discontinue this in his chart so it will reflect in his mychart that he no longer takes it.

## 2024-01-14 NOTE — Telephone Encounter (Signed)
 Left voicemail to return call to office.  Med list updated

## 2024-01-15 ENCOUNTER — Encounter

## 2024-01-22 ENCOUNTER — Ambulatory Visit: Admitting: Neurology

## 2024-01-22 DIAGNOSIS — R0609 Other forms of dyspnea: Secondary | ICD-10-CM

## 2024-01-22 DIAGNOSIS — I480 Paroxysmal atrial fibrillation: Secondary | ICD-10-CM

## 2024-01-22 DIAGNOSIS — I350 Nonrheumatic aortic (valve) stenosis: Secondary | ICD-10-CM

## 2024-01-22 DIAGNOSIS — G4733 Obstructive sleep apnea (adult) (pediatric): Secondary | ICD-10-CM | POA: Diagnosis not present

## 2024-01-22 DIAGNOSIS — I739 Peripheral vascular disease, unspecified: Secondary | ICD-10-CM

## 2024-01-22 DIAGNOSIS — R519 Headache, unspecified: Secondary | ICD-10-CM

## 2024-01-22 DIAGNOSIS — I2510 Atherosclerotic heart disease of native coronary artery without angina pectoris: Secondary | ICD-10-CM

## 2024-01-23 ENCOUNTER — Telehealth: Payer: Self-pay | Admitting: Physician Assistant

## 2024-01-23 NOTE — Telephone Encounter (Signed)
 He should take this medication as he normally would. Thanks. GM

## 2024-01-23 NOTE — Telephone Encounter (Signed)
 Inbound call from patient stated if he can take Keppra medication due to upcoming procedure on 01/25/2024    Please advise  Thank you

## 2024-01-23 NOTE — Progress Notes (Signed)
 [facility] [employer] 73 year old male [birth year]   HOME SLEEP TEST REPORT ( by Watch PAT)   STUDY DATE:  01-22-2024   ORDERING CLINICIAN:  Dedra Gores, MD  REFERRING CLINICIAN: Duwaine Russell, NP    CLINICAL INFORMATION/HISTORY: OSA on CPAP, GERD, New machine needed- see 12-25-2023 note by MM, NP  Patient is here alone for cpap follow-up. He did not bring his machine. He states he uses it on avg 6 hrs at night. He doesn't sleep well. He states he has daytime sleepiness when he does not have a consistent bowel movement. He has shoulder pain that bothers him at night. He is due for a new machine next month. Setup date 01/21/2019. His current machine doesn't do an electronic DL. He had a stroke in August 2024, was treated at [facility] and follows with them. Has AAA. TAA,  PAD and  popliteal arterial bypass,  ESS 8  [name] is a 73 y.o. male with a history of OSA on CPAP. Returns today for follow-up.  Patient did not bring his CPAP machine with him today.  We are unable to attain a download.  He is due for new machine next month.  He still states that he notices daytime sleepiness.  He does feel that the machine is beneficial.  States that when he does not use the machine he finds that he grinds and grits his teeth.  Reports that he did have a stroke last year has been followed by [facility].  Reports that the stroke was due to A-fib.  He is now on blood thinner-Eliquis .  He also states that he has shoulder pain.  This also keeps him from sleeping well at night.  He is followed by orthopedics.      Epworth sleepiness score:8 /24.   BMI: 28 kg/m   Neck Circumference: NA   FINDINGS:   Sleep Summary:   Total Recording Time (hours, min): 9 hours 20 minutes       Total Sleep Time (hours, min): 8 hours 12 minutes               Percent REM (%):     24.4%                                   Respiratory Indices:   Calculated pAHI (per CMS guideline):  10.8/h.  Under AASM guidelines the AHI is 21.6/h.    I would like to add that the non-REM sleep AHI is much higher than the REM sleep AHI.  Non-REM sleep AHI 12.9/h, REM sleep AHI 4.5/h.  This is highly suspicious for central apnea.to be present also the sleep test device did not indicate such.                                          Positional AHI:    364 minutes of supine sleep were associated with an AHI of 12.5/h 127 minutes of right sided sleep associated with an AHI of 13.3/h.  Snoring level reached a mean volume of 41 dB which is mild but snoring peaked intermittently at over 60 dB and was present for 18% of total sleep time.  Oxygen Saturation Statistics:   Oxygen Saturation (%) Mean: 92%              O2 Saturation Range (%): Between 86 and 99%, with 91 brief desaturation events.                                     O2 Saturation (minutes) <89%: 0 minutes           Pulse Rate Statistics:   Pulse Mean (bpm):    73 bpm             Pulse Range: Between 43 and 103 bpm                IMPRESSION:  This HST confirms the presence of mild sleep apnea which I suspect is partially central in origin.  Given the patient's history and complex medical problem list I would certainly want to continue positive airway pressure therapy.     RECOMMENDATION: The patient has to bring his machine to the first visit once the new PAP machine has been issued and should CPAP therapy induce central sleep apnea we would invite him back for an in-lab titration.  I will order an auto titration device CPAP between 7 and 17 cm water pressure with 3 cm EPR, with heated humidification and an interface to be fitted.  This patient had in the past high air leakage.  Revisit will be scheduled with Megan Milliken within 90 days of new CPAP device use.    INTERPRETING PHYSICIAN:   Dedra Gores, MD  [facility] and [facility] Sleep Board  certified by The [employer] of Sleep Medicine and Diplomate of the [employer] of Sleep Medicine. Board certified In Neurology through the ABPN, Fellow of the [employer] of Neu[employer].

## 2024-01-23 NOTE — Telephone Encounter (Signed)
 Patient advised to continue taking the Keppra as he normally would.

## 2024-01-25 ENCOUNTER — Ambulatory Visit (AMBULATORY_SURGERY_CENTER): Admitting: Gastroenterology

## 2024-01-25 ENCOUNTER — Encounter: Payer: Self-pay | Admitting: Gastroenterology

## 2024-01-25 VITALS — BP 117/77 | HR 73 | Temp 97.3°F | Resp 12 | Ht 72.0 in | Wt 202.0 lb

## 2024-01-25 DIAGNOSIS — D122 Benign neoplasm of ascending colon: Secondary | ICD-10-CM

## 2024-01-25 DIAGNOSIS — Z1211 Encounter for screening for malignant neoplasm of colon: Secondary | ICD-10-CM

## 2024-01-25 DIAGNOSIS — D127 Benign neoplasm of rectosigmoid junction: Secondary | ICD-10-CM | POA: Diagnosis not present

## 2024-01-25 DIAGNOSIS — K635 Polyp of colon: Secondary | ICD-10-CM | POA: Diagnosis not present

## 2024-01-25 DIAGNOSIS — K641 Second degree hemorrhoids: Secondary | ICD-10-CM | POA: Diagnosis not present

## 2024-01-25 DIAGNOSIS — D128 Benign neoplasm of rectum: Secondary | ICD-10-CM

## 2024-01-25 DIAGNOSIS — D125 Benign neoplasm of sigmoid colon: Secondary | ICD-10-CM

## 2024-01-25 DIAGNOSIS — Z860101 Personal history of adenomatous and serrated colon polyps: Secondary | ICD-10-CM

## 2024-01-25 MED ORDER — SODIUM CHLORIDE 0.9 % IV SOLN: 500.0000 mL | INTRAVENOUS | Status: DC

## 2024-01-25 NOTE — Progress Notes (Signed)
 GASTROENTEROLOGY PROCEDURE H&P NOTE   Primary Care Physician: Rolinda Millman, MD  HPI: [name] is a 73 y.o. male who presents for Colonoscopy for surveillance of previous adenomas.  Patient has history of last colonoscopy in 2024 with greater than 10 adenomas).  Past Medical History:  Diagnosis Date   Aortic stenosis    a. severe bicuspid AV stenosis s/p pericardial tissue valve 2012.   Arthritis    Coronary artery disease    a. s/p CABG (LIMA-LCx) at time of AVR 2012, mild nonobstructive LAD and RCA stenoses. // Myoview  03/2022: EF 48, no ischemia; low risk   Depression    Depression    Diabetes mellitus without complication [facility])    ED (erectile dysfunction)    Encounter for long-term (current) use of high-risk medication 06/30/2015   Fatty liver    Femoral-popliteal bypass graft occlusion, right [facility]) 1997   Dr. 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   Obesity    Obstructive sleep apnea 01/10/2021   PAD (peripheral artery disease) (HCC)    PONV (postoperative nausea and vomiting)    PVD (peripheral vascular disease) (HCC)    Stroke (HCC) 03/28/2023   Thoracic aortic aneurysm (HCC) 05/09/2022   Echocardiogram 10/23: EF 55-60, no RWMA, mild LVH, normal RVSF, mild MR, s/p AVR with mild AI and normal gradient (15 mmHg), mod dilation of aortic root and ascending aorta (45 mm).  Chest CT 10/23: Ascending thoracic aorta 4.1 cm; aortic atherosclerosis   Past Surgical History:  Procedure Laterality Date   AORTIC VALVE REPLACEMENT  07/18/2011   Procedure: AORTIC VALVE REPLACEMENT (AVR);  Surgeon: Dallas KATHEE Jude, MD;  Location: [facility] OR;   Service: Open Heart Surgery;  Laterality: N/A;   CARDIAC CATHETERIZATION  2009, 07/11/11   Dr. Dann   CORONARY ARTERY BYPASS GRAFT  07/18/2011   Procedure: CORONARY ARTERY BYPASS GRAFTING (CABG);  Surgeon: Dallas KATHEE Jude, MD;  Location: [facility] OR;  Service: Open Heart Surgery;  Laterality: N/A;  times one to mammary artery, left   fem stent Right 2017   FEMORAL ARTERY - POPLITEAL ARTERY BYPASS GRAFT     KNEE ARTHROSCOPY     left   MRI     to visualize aortic valve   PERIPHERAL VASCULAR CATHETERIZATION N/A 07/12/2016   Procedure: Abdominal Aortogram w/Lower Extremity;  Surgeon: Penne Lonni Colorado, MD;  Location: [facility] INVASIVE CV LAB;  Service: Cardiovascular;  Laterality: N/A;   PERIPHERAL VASCULAR CATHETERIZATION Right 07/12/2016   Procedure: Peripheral Vascular Atherectomy;  Surgeon: Penne Lonni Colorado, MD;  Location: [facility] INVASIVE CV LAB;  Service: Cardiovascular;  Laterality: Right;  Anterior Tibial and Popliteal   ROTATOR CUFF REPAIR     Left   US  ECHOCARDIOGRAPHY     Current Outpatient Medications  Medication Sig Dispense Refill   amoxicillin  (AMOXIL ) 500 MG capsule TAKE 4 CAPSULES ONE HOUR BEFORE DENTAL APPOINTMENT. 8 capsule 0   apixaban  (ELIQUIS ) 5 MG TABS tablet Take 1 tablet (5 mg total) by mouth 2 (two) times daily. 60 tablet 5  atorvastatin  (LIPITOR) 80 MG tablet Take one tablet (80 mg dose) by mouth daily. 90 tablet 3   cetirizine (ZYRTEC) 10 MG tablet 1 tablet Orally Once a day for 90 days     Cholecalciferol (VITAMIN D-3 PO) Take 1 tablet by mouth daily.     citalopram  (CELEXA ) 20 MG tablet Take 20 mg by mouth daily.     Cyanocobalamin  (VITAMIN B-12 PO) Take 1 tablet by mouth daily.     dapagliflozin  propanediol (FARXIGA ) 10 MG TABS tablet Take 1 tablet (10 mg total) by mouth daily. 90 tablet 0   enalapril  (VASOTEC ) 2.5 MG tablet Take 2.5 mg by mouth daily.     ezetimibe  (ZETIA ) 10 MG tablet Take 1 tablet (10 mg total) by mouth daily. 90 tablet 3    fluticasone (FLONASE) 50 MCG/ACT nasal spray Place 2 sprays into both nostrils daily.     furosemide  (LASIX ) 20 MG tablet TAKE ONE TABLET BY MOUTH DAILY 90 tablet 3   lansoprazole (PREVACID) 15 MG capsule Take 15 mg by mouth daily as needed. For acid reflux     levETIRAcetam (KEPPRA) 500 MG tablet Take 500 mg by mouth 2 (two) times daily.     metoprolol  tartrate (LOPRESSOR ) 50 MG tablet TAKE 1/2 TABLET TWICE DAILY 90 tablet 2   tamsulosin (FLOMAX) 0.4 MG CAPS capsule Take 0.4 mg by mouth daily.      traMADol  (ULTRAM ) 50 MG tablet Take 1 tablet (50 mg total) by mouth every 8 (eight) hours as needed. 30 tablet 0   Current Facility-Administered Medications  Medication Dose Route Frequency Provider Last Rate Last Admin   0.9 %  sodium chloride  infusion  500 mL Intravenous Continuous Mansouraty, Carry Weesner Jr., MD        Current Outpatient Medications:    amoxicillin  (AMOXIL ) 500 MG capsule, TAKE 4 CAPSULES ONE HOUR BEFORE DENTAL APPOINTMENT., Disp: 8 capsule, Rfl: 0   apixaban  (ELIQUIS ) 5 MG TABS tablet, Take 1 tablet (5 mg total) by mouth 2 (two) times daily., Disp: 60 tablet, Rfl: 5   atorvastatin  (LIPITOR) 80 MG tablet, Take one tablet (80 mg dose) by mouth daily., Disp: 90 tablet, Rfl: 3   cetirizine (ZYRTEC) 10 MG tablet, 1 tablet Orally Once a day for 90 days, Disp: , Rfl:    Cholecalciferol (VITAMIN D-3 PO), Take 1 tablet by mouth daily., Disp: , Rfl:    citalopram  (CELEXA ) 20 MG tablet, Take 20 mg by mouth daily., Disp: , Rfl:    Cyanocobalamin  (VITAMIN B-12 PO), Take 1 tablet by mouth daily., Disp: , Rfl:    dapagliflozin  propanediol (FARXIGA ) 10 MG TABS tablet, Take 1 tablet (10 mg total) by mouth daily., Disp: 90 tablet, Rfl: 0   enalapril  (VASOTEC ) 2.5 MG tablet, Take 2.5 mg by mouth daily., Disp: , Rfl:    ezetimibe  (ZETIA ) 10 MG tablet, Take 1 tablet (10 mg total) by mouth daily., Disp: 90 tablet, Rfl: 3   fluticasone (FLONASE) 50 MCG/ACT nasal spray, Place 2 sprays into both nostrils  daily., Disp: , Rfl:    furosemide  (LASIX ) 20 MG tablet, TAKE ONE TABLET BY MOUTH DAILY, Disp: 90 tablet, Rfl: 3   lansoprazole (PREVACID) 15 MG capsule, Take 15 mg by mouth daily as needed. For acid reflux, Disp: , Rfl:    levETIRAcetam (KEPPRA) 500 MG tablet, Take 500 mg by mouth 2 (two) times daily., Disp: , Rfl:    metoprolol  tartrate (LOPRESSOR ) 50 MG tablet, TAKE 1/2 TABLET TWICE DAILY, Disp: 90 tablet, Rfl: 2  tamsulosin (FLOMAX) 0.4 MG CAPS capsule, Take 0.4 mg by mouth daily. , Disp: , Rfl:    traMADol  (ULTRAM ) 50 MG tablet, Take 1 tablet (50 mg total) by mouth every 8 (eight) hours as needed., Disp: 30 tablet, Rfl: 0  Current Facility-Administered Medications:    0.9 %  sodium chloride  infusion, 500 mL, Intravenous, Continuous, Mansouraty, Aloha Raddle., MD Allergies  Allergen Reactions   Clopidogrel  Itching   Sulfamethoxazole-Trimethoprim Other (See Comments)    Dizziness, flushed   Codeine Other (See Comments)    Blood pressure drops   Flexeril [Cyclobenzaprine]     sedation   Morphine  And Codeine Itching   Family History  Problem Relation Age of Onset   Hypertension Father    Diabetes Father    Lung cancer Father    Breast cancer Sister    Healthy Brother    Heart disease Maternal Grandmother    Healthy Maternal Grandfather    Cancer Maternal Grandfather    Liver disease Neg Hx    Colon cancer Neg Hx    Esophageal cancer Neg Hx    Social History   Socioeconomic History   Marital status: Divorced    Spouse name: Not on file   Number of children: 4   Years of education: Not on file   Highest education level: Bachelor's degree (e.g., BA, AB, BS)  Occupational History   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[employer]   Tobacco Use   Smoking status: Never    Passive exposure: Never   Smokeless tobacco: Never   Tobacco comments:    Never smoked 05/18/23  Vaping Use   Vaping status: Never Used  Substance and Sexual Activity   Alcohol use: No   Drug use:  No   Sexual activity: Not on file  Other Topics Concern   Not on file  Social History Narrative   Lives at home with friend Heron Epp   Left handed   Caffeine: about 1 cups daily   Social Drivers of Health   Financial Resource Strain: Low Risk  (03/30/2023)   Received from [facility]   Overall Financial Resource Strain (CARDIA)    Difficulty of Paying Living Expenses: Not hard at all  Food Insecurity: No Food Insecurity (10/08/2023)   Received from [facility]   Hunger Vital Sign    Within the past 12 months, you worried that your food would run out before you got the money to buy more.: Never true    Within the past 12 months, the food you bought just didn't last and you didn't have money to get more.: Never true  Transportation Needs: No Transportation Needs (10/08/2023)   Received from [facility] - Transportation    Lack of Transportation (Medical): No    Lack of Transportation (Non-Medical): No  Physical Activity: Sufficiently Active (10/08/2023)   Received from [facility]   Exercise Vital Sign    On average, how many days per week do you engage in moderate to strenuous exercise (like a brisk walk)?: 6 days    On average, how many minutes do you engage in exercise at this level?: 150+ min  Stress: No Stress Concern Present (10/08/2023)   Received from [facility] - Occupational Stress Questionnaire    Feeling of Stress : Not at all  Social Connections: Moderately Integrated (10/08/2023)   Received from [facility]   Social Network    How would you  rate your social network (family, work, friends)?: Adequate participation with social networks  Intimate Partner Violence: Not At Risk (10/08/2023)   Received from [facility]   HITS    Over the last 12 months how often did your partner physically hurt you?: Never    Over the last 12 months how often did your partner insult you or talk down to you?: Patient  declined    Over the last 12 months how often did your partner threaten you with physical harm?: Patient declined    Over the last 12 months how often did your partner scream or curse at you?: Patient declined    Physical Exam: Today's Vitals   01/25/24 1452  BP: 127/74  Pulse: 89  Temp: (!) 97.3 F (36.3 C)  TempSrc: Skin  SpO2: 94%  Weight: 202 lb (91.6 kg)  Height: 6' (1.829 m)   Body mass index is 27.4 kg/m. GEN: NAD EYE: Sclerae anicteric ENT: MMM CV: Non-tachycardic GI: Soft, NT/ND NEURO:  Alert & Oriented x 3  Lab Results: No results for input(s): WBC, HGB, HCT, PLT in the last 72 hours. BMET No results for input(s): NA, K, CL, CO2, GLUCOSE, BUN, CREATININE, CALCIUM  in the last 72 hours. LFT No results for input(s): PROT, ALBUMIN , AST, ALT, ALKPHOS, BILITOT, BILIDIR, IBILI in the last 72 hours. PT/INR No results for input(s): LABPROT, INR in the last 72 hours.   Impression / Plan: This is a 73 y.o.male who presents for Colonoscopy for surveillance of previous adenomas.  Patient has history of last colonoscopy in 2024 with greater than 10 adenomas).  The risks and benefits of endoscopic evaluation/treatment were discussed with the patient and/or family; these include but are not limited to the risk of perforation, infection, bleeding, missed lesions, lack of diagnosis, severe illness requiring hospitalization, as well as anesthesia and sedation related illnesses.  The patient's history has been reviewed, patient examined, no change in status, and deemed stable for procedure.  The patient and/or family is agreeable to proceed.    Aloha Finner, MD [facility]oscopy Office # [phone]

## 2024-01-25 NOTE — Op Note (Signed)
 [facility] Patient Name: [name] Procedure Date: 01/25/2024 3:20 PM MRN: [id number] Endoscopist: Aloha Finner , MD, [id number] Age: 73 Referring MD:  Date of Birth: 1950-08-02 Gender: Male Account #: [id number] Procedure:                Colonoscopy Indications:              Surveillance: History of numerous (> 10) adenomas                            on last colonoscopy (< 3 yrs), High risk colon                            cancer surveillance: Personal history of adenoma                            (10 mm or greater in size), High risk colon cancer                            surveillance: Personal history of adenoma less than                            10 mm in size Medicines:                Monitored Anesthesia Care Procedure:                Pre-Anesthesia Assessment:                           - Prior to the procedure, a History and Physical                            was performed, and patient medications and                            allergies were reviewed. The patient's tolerance of                            previous anesthesia was also reviewed. The risks                            and benefits of the procedure and the sedation                            options and risks were discussed with the patient.                            All questions were answered, and informed consent                            was obtained. Prior Anticoagulants: The patient has                            taken Eliquis  (apixaban ), last dose was 2 days  prior to procedure. ASA Grade Assessment: III - A                            patient with severe systemic disease. After                            reviewing the risks and benefits, the patient was                            deemed in satisfactory condition to undergo the                            procedure.                           After obtaining informed consent, the colonoscope                             was passed under direct vision. Throughout the                            procedure, the patient's blood pressure, pulse, and                            oxygen saturations were monitored continuously. The                            Olympus CF-HQ190L ([id number]) Colonoscope was                            introduced through the anus and advanced to the 3                            cm into the ileum. The colonoscopy was performed                            without difficulty. The patient tolerated the                            procedure. The quality of the bowel preparation was                            good. The ileocecal valve, appendiceal orifice, and                            rectum were photographed. Scope In: 3:36:00 PM Scope Out: 3:48:56 PM Scope Withdrawal Time: 0 hours 10 minutes 20 seconds  Total Procedure Duration: 0 hours 12 minutes 56 seconds  Findings:                 The digital rectal exam findings include                            hemorrhoids. Pertinent negatives include no  palpable rectal lesions.                           Three sessile polyps were found in the rectum,                            recto-sigmoid colon and ascending colon. The polyps                            were 2 to 3 mm in size. These polyps were removed                            with a cold snare. Resection and retrieval were                            complete.                           Normal mucosa was found in the entire colon                            otherwise.                           Non-bleeding non-thrombosed internal hemorrhoids                            were found during retroflexion, during perianal                            exam and during digital exam. The hemorrhoids were                            Grade II (internal hemorrhoids that prolapse but                            reduce spontaneously). Complications:            No immediate  complications. Estimated Blood Loss:     Estimated blood loss was minimal. Impression:               - Hemorrhoids found on digital rectal exam.                           - Three 2 to 3 mm polyps in the rectum, at the                            recto-sigmoid colon and in the ascending colon,                            removed with a cold snare. Resected and retrieved.                           - Normal mucosa in the entire examined colon  otherwise.                           - Non-bleeding non-thrombosed internal hemorrhoids. Recommendation:           - The patient will be observed post-procedure,                            until all discharge criteria are met.                           - Discharge patient to home.                           - Patient has a contact number available for                            emergencies. The signs and symptoms of potential                            delayed complications were discussed with the                            patient. Return to normal activities tomorrow.                            Written discharge instructions were provided to the                            patient.                           - High fiber diet.                           - Use FiberCon 1-2 tablets PO daily.                           - May restart Eliquis  on 6/29 to decrease risk of                            post interventional bleeding.                           - Continue present medications.                           - Await pathology results.                           - Repeat colonoscopy in 3 years for surveillance                            (no matter pathology due to number of polyps found                            overall within the last  year). Patient should never                            go more than 5 years between procedures (pending                            health overall) due to history of previous advanced                             adenomas as well.                           - The findings and recommendations were discussed                            with the patient.                           - The findings and recommendations were discussed                            with the patient's family. Aloha Finner, MD 01/25/2024 3:54:04 PM

## 2024-01-25 NOTE — Progress Notes (Signed)
 Pt sedate, gd SR's, VSS, report to RN

## 2024-01-25 NOTE — Progress Notes (Signed)
 Patient states there have been no changes to medical or surgical history since time of pre-visit.

## 2024-01-25 NOTE — Progress Notes (Signed)
 Called to room to assist during endoscopic procedure.  Patient ID and intended procedure confirmed with present staff. Received instructions for my participation in the procedure from the performing physician.

## 2024-01-25 NOTE — Patient Instructions (Addendum)
   May restart Eliquis  ( your blood thinner) 01/27/24  Handouts on polyps & hemorrhoids given to you today.   Await pathology results on polyps removed   Use FiberCon 1-2 tablets by mouth daily  Continue previous diet & medications  Appointment made to follow up in office with Dr Wilhelmenia see page     YOU HAD AN ENDOSCOPIC PROCEDURE TODAY AT THE [facility]:   Refer to the procedure report that was given to you for any specific questions about what was found during the examination.  If the procedure report does not answer your questions, please call your gastroenterologist to clarify.  If you requested that your care partner not be given the details of your procedure findings, then the procedure report has been included in a sealed envelope for you to review at your convenience later.  YOU SHOULD EXPECT: Some feelings of bloating in the abdomen. Passage of more gas than usual.  Walking can help get rid of the air that was put into your GI tract during the procedure and reduce the bloating. If you had a lower endoscopy (such as a colonoscopy or flexible sigmoidoscopy) you may notice spotting of blood in your stool or on the toilet paper. If you underwent a bowel prep for your procedure, you may not have a normal bowel movement for a few days.  Please Note:  You might notice some irritation and congestion in your nose or some drainage.  This is from the oxygen used during your procedure.  There is no need for concern and it should clear up in a day or so.  SYMPTOMS TO REPORT IMMEDIATELY:  Following lower endoscopy (colonoscopy or flexible sigmoidoscopy):  Excessive amounts of blood in the stool  Significant tenderness or worsening of abdominal pains  Swelling of the abdomen that is new, acute  Fever of 100F or higher   For urgent or emergent issues, a gastroenterologist can be reached at any hour by calling [phone]. Do not use MyChart messaging for urgent concerns.     DIET:  We do recommend a small meal at first, but then you may proceed to your regular diet.  Drink plenty of fluids but you should avoid alcoholic beverages for 24 hours.  ACTIVITY:  You should plan to take it easy for the rest of today and you should NOT DRIVE or use heavy machinery until tomorrow (because of the sedation medicines used during the test).    FOLLOW UP: Our staff will call the number listed on your records the next business day following your procedure.  We will call around 7:15- 8:00 am to check on you and address any questions or concerns that you may have regarding the information given to you following your procedure. If we do not reach you, we will leave a message.     If any biopsies were taken you will be contacted by phone or by letter within the next 1-3 weeks.  Please call us  at [phone] if you have not heard about the biopsies in 3 weeks.    SIGNATURES/CONFIDENTIALITY: You and/or your care partner have signed paperwork which will be entered into your electronic medical record.  These signatures attest to the fact that that the information above on your After Visit Summary has been reviewed and is understood.  Full responsibility of the confidentiality of this discharge information lies with you and/or your care-partner.

## 2024-01-28 ENCOUNTER — Other Ambulatory Visit (HOSPITAL_COMMUNITY): Payer: Self-pay

## 2024-01-28 ENCOUNTER — Telehealth: Payer: Self-pay | Admitting: Adult Health

## 2024-01-28 ENCOUNTER — Telehealth: Payer: Self-pay

## 2024-01-28 NOTE — Telephone Encounter (Signed)
 Pt asking for a call to discuss results from at home sleep study

## 2024-01-28 NOTE — Telephone Encounter (Signed)
  Follow up Call-     01/25/2024    2:52 PM 09/15/2022   10:25 AM  Call back number  Post procedure Call Back phone  # [phone]  Permission to leave phone message Yes Yes     Patient questions:  Do you have a fever, pain , or abdominal swelling? No. Pain Score  0 *  Have you tolerated food without any problems? Yes.    Have you been able to return to your normal activities? Yes.    Do you have any questions about your discharge instructions: Diet   No. Medications  No. Follow up visit  No.  Do you have questions or concerns about your Care? No.  Actions: * If pain score is 4 or above: No action needed, pain <4.

## 2024-01-31 LAB — SURGICAL PATHOLOGY

## 2024-02-07 DIAGNOSIS — G4733 Obstructive sleep apnea (adult) (pediatric): Secondary | ICD-10-CM | POA: Insufficient documentation

## 2024-02-07 NOTE — Procedures (Signed)
 [facility] [employer] 73 year old male [birth year]   HOME SLEEP TEST REPORT ( by Watch PAT)   STUDY DATE:  01-22-2024   ORDERING CLINICIAN:  Dedra Gores, MD  REFERRING CLINICIAN: Duwaine Russell, NP    CLINICAL INFORMATION/HISTORY: OSA on CPAP, GERD, New machine needed- see 12-25-2023 note by MM, NP  Patient is here alone for cpap follow-up. He did not bring his machine. He states he uses it on avg 6 hrs at night. He doesn't sleep well. He states he has daytime sleepiness when he does not have a consistent bowel movement. He has shoulder pain that bothers him at night. He is due for a new machine next month. Setup date 01/21/2019. His current machine doesn't do an electronic DL. He had a stroke in August 2024, was treated at [facility] and follows with them. Has AAA. TAA,  PAD and  popliteal arterial bypass,  ESS 8  [name] is a 73 y.o. male with a history of OSA on CPAP. Returns today for follow-up.  Patient did not bring his CPAP machine with him today.  We are unable to attain a download.  He is due for new machine next month.  He still states that he notices daytime sleepiness.  He does feel that the machine is beneficial.  States that when he does not use the machine he finds that he grinds and grits his teeth.  Reports that he did have a stroke last year has been followed by [facility].  Reports that the stroke was due to A-fib.  He is now on blood thinner-Eliquis .  He also states that he has shoulder pain.  This also keeps him from sleeping well at night.  He is followed by orthopedics.      Epworth sleepiness score:8 /24.   BMI: 28 kg/m   Neck Circumference: NA   FINDINGS:   Sleep Summary:   Total Recording Time (hours, min): 9 hours 20 minutes       Total Sleep Time (hours, min): 8 hours 12 minutes               Percent REM (%):     24.4%                                   Respiratory Indices:   Calculated pAHI (per CMS guideline):  10.8/h.  Under AASM guidelines the AHI is 21.6/h.    I would like to add that the non-REM sleep AHI is much higher than the REM sleep AHI.  Non-REM sleep AHI 12.9/h, REM sleep AHI 4.5/h.  This is highly suspicious for central apnea.to be present also the sleep test device did not indicate such.                                          Positional AHI:    364 minutes of supine sleep were associated with an AHI of 12.5/h 127 minutes of right sided sleep associated with an AHI of 13.3/h.  Snoring level reached a mean volume of 41 dB which is mild but snoring peaked intermittently at over 60 dB and was present for 18% of total sleep time.  Oxygen Saturation Statistics:   Oxygen Saturation (%) Mean: 92%              O2 Saturation Range (%): Between 86 and 99%, with 91 brief desaturation events.                                     O2 Saturation (minutes) <89%: 0 minutes           Pulse Rate Statistics:   Pulse Mean (bpm):    73 bpm             Pulse Range: Between 43 and 103 bpm                IMPRESSION:  This HST confirms the presence of mild sleep apnea which I suspect is partially central in origin.  Given the patient's history and complex medical problem list I would certainly want to continue positive airway pressure therapy.     RECOMMENDATION: The patient has to bring his machine to the first visit once the new PAP machine has been issued and should CPAP therapy induce central sleep apnea we would invite him back for an in-lab titration.  I will order an auto titration device CPAP between 7 and 17 cm water pressure with 3 cm EPR, with heated humidification and an interface to be fitted.  This patient had in the past high air leakage.  Revisit will be scheduled with Megan Milliken within 90 days of new CPAP device use.    INTERPRETING PHYSICIAN:   Dedra Gores, MD  [facility] and [facility] Sleep Board  certified by The [employer] of Sleep Medicine and Diplomate of the [employer] of [employer]. Board certified In Neurology through the ABPN, Fellow of the [employer] of [employer].

## 2024-02-10 ENCOUNTER — Ambulatory Visit: Payer: Self-pay | Admitting: Gastroenterology

## 2024-02-12 ENCOUNTER — Telehealth: Payer: Self-pay

## 2024-02-12 DIAGNOSIS — G4733 Obstructive sleep apnea (adult) (pediatric): Secondary | ICD-10-CM

## 2024-02-12 NOTE — Telephone Encounter (Signed)
 Pt LVM on sleep lab phone asking for another copy of his sleep study be placed in MyChart.

## 2024-02-12 NOTE — Telephone Encounter (Signed)
 Please let patient know that I apologize for the delay.  Dr. Chalice was out [place] and when she did read the sleep study I do get clarification on her results.  It did show mild sleep apnea let the patient know I have ordered him a new machine.  His DME should be contacting him

## 2024-02-12 NOTE — Addendum Note (Signed)
 Addended by: SHERRYL DUWAINE SQUIBB on: 02/12/2024 04:13 PM   Modules accepted: Orders

## 2024-02-13 NOTE — Telephone Encounter (Signed)
 I called the patient and LVM (ok per DPR) with Megan's message. I let the pt know his sleep study showed mild osa and  a new machine has been ordered. Order is being sent to [facility]. They will call him to discuss the next steps. Also gave friendly reminder to use machine at least 4 hours at night and call us  as soon as he gets machine to schedule a 30-90 day follow-up. Left office number in message for pt to call back if he has any questions or concerns.   Order sent to [facility].

## 2024-02-13 NOTE — Telephone Encounter (Signed)
 New, Maryella Shivers, Otilio Jefferson, RN; Alain Honey; Jeris Penta, [place]; 1 other Received, thank you!

## 2024-02-14 DIAGNOSIS — M109 Gout, unspecified: Secondary | ICD-10-CM | POA: Diagnosis not present

## 2024-02-18 ENCOUNTER — Other Ambulatory Visit (HOSPITAL_COMMUNITY): Payer: Self-pay

## 2024-02-20 DIAGNOSIS — R5383 Other fatigue: Secondary | ICD-10-CM | POA: Diagnosis not present

## 2024-02-20 DIAGNOSIS — E782 Mixed hyperlipidemia: Secondary | ICD-10-CM | POA: Diagnosis not present

## 2024-02-20 DIAGNOSIS — E538 Deficiency of other specified B group vitamins: Secondary | ICD-10-CM | POA: Diagnosis not present

## 2024-02-20 DIAGNOSIS — E1169 Type 2 diabetes mellitus with other specified complication: Secondary | ICD-10-CM | POA: Diagnosis not present

## 2024-02-20 DIAGNOSIS — M79674 Pain in right toe(s): Secondary | ICD-10-CM | POA: Diagnosis not present

## 2024-02-20 DIAGNOSIS — I739 Peripheral vascular disease, unspecified: Secondary | ICD-10-CM | POA: Diagnosis not present

## 2024-03-03 ENCOUNTER — Other Ambulatory Visit (HOSPITAL_COMMUNITY): Payer: Self-pay

## 2024-03-04 ENCOUNTER — Other Ambulatory Visit: Payer: Self-pay

## 2024-03-05 ENCOUNTER — Other Ambulatory Visit (HOSPITAL_COMMUNITY): Payer: Self-pay

## 2024-03-05 MED ORDER — DAPAGLIFLOZIN PROPANEDIOL 10 MG PO TABS
10.0000 mg | ORAL_TABLET | Freq: Every day | ORAL | 0 refills | Status: DC
  Filled 2024-03-05: qty 30, 30d supply, fill #0
  Filled 2024-04-01: qty 30, 30d supply, fill #1
  Filled 2024-04-26: qty 30, 30d supply, fill #2

## 2024-03-12 ENCOUNTER — Ambulatory Visit: Payer: BC Managed Care – PPO | Attending: Internal Medicine | Admitting: Internal Medicine

## 2024-03-12 ENCOUNTER — Encounter: Payer: Self-pay | Admitting: Internal Medicine

## 2024-03-12 VITALS — BP 119/80 | HR 84 | Resp 14 | Ht 72.0 in | Wt 209.0 lb

## 2024-03-12 DIAGNOSIS — L4050 Arthropathic psoriasis, unspecified: Secondary | ICD-10-CM

## 2024-03-12 DIAGNOSIS — L409 Psoriasis, unspecified: Secondary | ICD-10-CM

## 2024-03-12 DIAGNOSIS — M75102 Unspecified rotator cuff tear or rupture of left shoulder, not specified as traumatic: Secondary | ICD-10-CM

## 2024-03-12 DIAGNOSIS — M1A9XX0 Chronic gout, unspecified, without tophus (tophi): Secondary | ICD-10-CM

## 2024-03-12 DIAGNOSIS — M12812 Other specific arthropathies, not elsewhere classified, left shoulder: Secondary | ICD-10-CM

## 2024-03-12 NOTE — Progress Notes (Signed)
 Office Visit Note  Patient: [name]             Date of Birth: 1951-02-21           MRN: [id number]             PCP: Rolinda Millman, MD Referring: Rolinda Millman, MD Visit Date: 03/12/2024   Subjective:  Follow-up (Patient states he had an episode with Gout in his right foot and was prescribed prednisone . )   Discussed the use of AI scribe software for clinical note transcription with the patient, who gave verbal consent to proceed.  History of Present Illness   [name] is a 73 year old male with osteoarthritis and gout and psoriatic arthritis who presents with a recent flare of joint swelling and pain.  Approximately three to four weeks ago, he experienced a flare of joint swelling and pain, initially localized to his right second toe and later spreading to the small toe. The entire toe became swollen and red, with the redness moving over time. A five-day course of prednisone  did not improve the condition, but a subsequent twelve-day regimen of prednisone  and an unspecified antibiotic seemed to resolve the issue. The pain was so sensitive to touch that he could not tolerate a sheet over the affected area. This episode felt typical of previous gout flares, although it had been a while since his last flare. He reports no recent changes in activity or diet that could have provoked the flare, but acknowledges possibly not drinking enough fluids and having consumed red meat, which he does not typically eat.  He has a history of psoriatic arthritis but reports no recent skin flare-ups or rashes. He experiences joint pain and stiffness, attributing it to aging. He has been seen by an orthopedic specialist for left shoulder pain, which was treated with an injection, providing some relief. He is not a candidate for surgery due to a history of stroke and current use of blood thinners.  He reports a history of triggering in his hands, which has not been bothersome recently. He  mentions nodules on his fingers. He has been on medication for psoriatic arthritis in the past.  In the past, alcohol consumption seemed to trigger gout flares, but he has since stopped drinking. He notes changes in his eating habits following a stroke, leading to weight gain, which he attributes to prednisone  use.       Previous HPI 09/12/23 [name] is a 73 year old male with history of psoriatic arthritis and psoriasis currently monitoring off DMARDs.  He has not experienced any severe flareup with pain or swelling in his joints previously had wrist pain attributed.  He has not seen any recurrence of skin rashes.   He has persistent left shoulder pain and stiffness with his known arthritis and rotator cuff arthropathy.  Had repeat steroid injection with Dr. Brion office in November but did not get significant relief with this like he has had in the past.  Is currently waiting on possibility of surgery till he is further out from previous stroke event and need for anticoagulation.   He experiences intermittent trigger finger in the left hand, with soreness along the tendon. The symptoms are not severe enough to warrant intervention at this time, and he manages with stretching exercises.  He was having triggering of the right index finger but this is already improved on its own.   He reports numbness in the right hand,  affecting the first three fingers, which sometimes occurs in the left hand as well. This numbness can happen while driving or at night and has been ongoing for over a year, predating a stroke he had last year. He denies any known history of carpal tunnel syndrome but acknowledges using a computer, which could contribute to the symptoms.   He has not been on antibiotics recently, except for dental procedures, and has not been sick otherwise.      Previous HPI 06/11/23 [name] is a 73 y.o. male here to establish care for psoriatic arthritis.  His medical  history significant for CAD, stroke, aortic valve replacement, and paroxysmal A-fib on long-term anticoagulation. Also has chronic osteoarthritis, gout, and injury of multiple joints with previous arthroscopic repair of left shoulder rotator cuff and left knee meniscus.  He sees orthopedic surgery for this with viscosupplementation injections for his knees by Dr. Jerri and shoulder with Dr. Eldonna.  He is transferring care from Dr. Lamar Siad in [place] to reduce his commutes. Psoriasis started since his teenage years with chronic longstanding symptoms.  Describes psoriatic plaques involving his scalp, arms, and bilateral knees treated primarily with topical steroids over time.  He was never on systemic treatment until starting Humira which was about 6 years ago.  At the time increase in joint pain and stiffness in multiple areas and extent of skin rashes.  He had occasional joint swelling but more often pain and stiffness without much visible change.  He was also receiving steroid injection for trigger finger involving the right index finger and left ring finger.  He came off the Humira since 3 to 4 months ago due to transitioning care so far he has not noticed any appreciable change in symptoms.  His shoulder pain and knee pains are still present but feels manageable with his current injections about every 6 months and takes Tylenol  as needed.   Review of Systems  Constitutional:  Negative for fatigue.  HENT:  Negative for mouth sores and mouth dryness.   Eyes:  Negative for dryness.  Respiratory:  Negative for shortness of breath.   Cardiovascular:  Negative for chest pain and palpitations.  Gastrointestinal:  Positive for constipation and diarrhea. Negative for blood in stool.  Endocrine: Positive for increased urination.  Genitourinary:  Negative for involuntary urination.  Musculoskeletal:  Positive for joint pain, gait problem, joint pain, myalgias, muscle weakness and myalgias. Negative for  joint swelling, morning stiffness and muscle tenderness.  Skin:  Negative for color change, rash, hair loss and sensitivity to sunlight.  Allergic/Immunologic: Negative for susceptible to infections.  Neurological:  Positive for dizziness. Negative for headaches.  Hematological:  Negative for swollen glands.  Psychiatric/Behavioral:  Negative for depressed mood and sleep disturbance. The patient is nervous/anxious.     PMFS History:  Patient Active Problem List   Diagnosis Date Noted   OSA on CPAP 02/07/2024   Finger numbness 09/12/2023   Psoriatic arthritis (HCC) 06/11/2023   Trigger finger, left ring finger 06/11/2023   Trigger finger, right index finger 06/11/2023   Hypercoagulable state due to paroxysmal atrial fibrillation (HCC) 04/20/2023   Thoracic aortic aneurysm (HCC) 05/09/2022   Abnormal MRI, neck 11/16/2021   Rotator cuff arthropathy of left shoulder 08/17/2021   Chronic left shoulder pain 08/17/2021   Chronic frontal sinusitis 07/07/2021   Facial paresthesia 07/07/2021   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 07/07/2021   Cervical stenosis of spinal canal 06/07/2021   Meningeal irritation  06/07/2021   Idiopathic intracranial hypotension 06/07/2021   Cervicalgia 03/07/2021   Chronic daily headache 03/07/2021   Migraine 01/10/2021   Obstructive sleep apnea 01/10/2021   Renal stones 10/04/2020   Type 2 diabetes mellitus with unspecified complications (HCC) 10/04/2020   Benign prostatic hyperplasia without lower urinary tract symptoms 09/30/2020   GERD (gastroesophageal reflux disease) 05/05/2020   Irritable bowel syndrome 02/19/2020   Dependence on other enabling machines and devices 02/19/2020   ED (erectile dysfunction) of organic origin 02/19/2020   Major depression single episode, in partial remission (HCC) 02/19/2020   Obesity 02/19/2020   Chronic right-sided low back pain without sciatica 12/16/2019   Primary osteoarthritis of left  knee 09/16/2019   Rotator cuff tear arthropathy of left shoulder 09/16/2019   Primary osteoarthritis of right knee 09/16/2019   Complex tear of medial meniscus of right knee as current injury 03/04/2019   Chronic diastolic heart failure (HCC) 11/25/2018   Bruxism (teeth grinding) 11/25/2018   Cephalalgia 11/25/2018   Cerebrovascular disease 11/25/2018   Morning headache 11/25/2018   Sleep related headaches 09/26/2018   Chronic idiopathic gout without tophus 02/13/2018   Hypertension    History of adenomatous polyp of colon 03/14/2016   Thrombosed hemorrhoids 03/01/2016   Encounter for long-term (current) use of high-risk medication 06/30/2015   Psoriasis 12/25/2014   Major depressive disorder, recurrent, in full remission (HCC) 06/22/2014   Gout 04/21/2014   Chest pain, somewhat atypical 12/28/2013   Dyspnea on exertion 12/27/2011   Dizziness 08/15/2011   Heart valve replaced by other means 07/28/2011   Paroxysmal atrial fibrillation (HCC) 07/28/2011   Aortic stenosis, severe 07/18/2011   Coronary artery disease 07/06/2011   Peripheral vascular disease (HCC) 07/06/2011   Hyperlipidemia 07/06/2011   Bicuspid aortic valve 07/06/2011    Past Medical History:  Diagnosis Date   Aortic stenosis    a. severe bicuspid AV stenosis s/p pericardial tissue valve 2012.   Arthritis    Coronary artery disease    a. s/p CABG (LIMA-LCx) at time of AVR 2012, mild nonobstructive LAD and RCA stenoses. // Myoview  03/2022: EF 48, no ischemia; low risk   Depression    Depression    Diabetes mellitus without complication [facility])    ED (erectile dysfunction)    Encounter for long-term (current) use of high-risk medication 06/30/2015   Fatty liver    Femoral-popliteal bypass graft occlusion, right [facility]) 1997   Dr. 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   Obesity    Obstructive sleep apnea 01/10/2021   PAD (peripheral artery disease) (HCC)    PONV (postoperative nausea and vomiting)    PVD (peripheral vascular disease) (HCC)    Stroke (HCC) 03/28/2023   Thoracic aortic aneurysm (HCC) 05/09/2022   Echocardiogram 10/23: EF 55-60, no RWMA, mild LVH, normal RVSF, mild MR, s/p AVR with mild AI and normal gradient (15 mmHg), mod dilation of aortic root and ascending aorta (45 mm).  Chest CT 10/23: Ascending thoracic aorta 4.1 cm; aortic atherosclerosis    Family History  Problem Relation Age of Onset   Hypertension Father    Diabetes Father    Lung cancer Father    Breast cancer Sister    Pancreatic cancer Brother    Heart disease Maternal Grandmother    Cancer Maternal Grandfather    Liver disease Neg Hx    Colon cancer Neg Hx    Esophageal cancer Neg Hx    Rectal cancer Neg Hx    Stomach cancer Neg Hx    Past Surgical History:  Procedure Laterality Date   AORTIC VALVE REPLACEMENT  07/18/2011   Procedure: AORTIC VALVE REPLACEMENT (AVR);  Surgeon: Dallas KATHEE Jude, MD;  Location: [facility] OR;  Service: Open Heart Surgery;  Laterality: N/A;   CARDIAC CATHETERIZATION  2009, 07/11/11   Dr. Dann   CORONARY ARTERY BYPASS GRAFT  07/18/2011   Procedure: CORONARY ARTERY BYPASS GRAFTING (CABG);  Surgeon: Dallas KATHEE Jude, MD;  Location: [facility] OR;  Service: Open Heart Surgery;  Laterality: N/A;  times one to mammary artery, left   fem stent Right 2017   FEMORAL ARTERY - POPLITEAL ARTERY BYPASS GRAFT     KNEE ARTHROSCOPY     left   MRI     to visualize aortic valve   PERIPHERAL VASCULAR CATHETERIZATION N/A 07/12/2016   Procedure: Abdominal Aortogram w/Lower Extremity;  Surgeon: Penne Lonni [place], MD;  Location: [facility] INVASIVE CV LAB;  Service:  Cardiovascular;  Laterality: N/A;   PERIPHERAL VASCULAR CATHETERIZATION Right 07/12/2016   Procedure: Peripheral Vascular Atherectomy;  Surgeon: Penne Lonni [place], MD;  Location: [facility] INVASIVE CV LAB;  Service: Cardiovascular;  Laterality: Right;  Anterior Tibial and Popliteal   ROTATOR CUFF REPAIR     Left   US  ECHOCARDIOGRAPHY     Social History   Social History Narrative   Lives at home with friend Heron Epp   Left handed   Caffeine: about 1 cups daily   Immunization History  Administered Date(s) Administered   Fluzone Influenza virus vaccine,trivalent (IIV3), split virus 06/25/2017   Influenza, Seasonal, Injecte, Preservative Fre 07/02/2016   Influenza,inj,quad, With Preservative 06/14/2015, 06/25/2017   Influenza-Unspecified 06/14/2015, 06/25/2017, 08/01/2018, 08/12/2019, 05/31/2020   PFIZER(Purple Top)SARS-COV-2 Vaccination 09/28/2019, 10/28/2019   Pneumococcal Conjugate-13 06/14/2015, 06/25/2017   Pneumococcal Polysaccharide-23 06/25/2017, 06/07/2020   Pneumococcal-Unspecified 06/07/2020   Td 09/25/2003   Tdap 07/01/2015, 08/01/2018     Objective: Vital Signs: BP 119/80 (BP Location: Left Arm, Patient Position: Sitting, Cuff Size: Normal)   Pulse 84   Resp 14   Ht 6' (1.829 m)   Wt 209 lb (94.8 kg)   BMI 28.35 kg/m    Physical Exam Eyes:     Conjunctiva/sclera: Conjunctivae normal.  Cardiovascular:     Rate and Rhythm: Normal rate and regular rhythm.  Pulmonary:     Effort: Pulmonary effort is normal.     Breath sounds: Normal breath sounds.  Skin:    General: Skin is warm and dry.  Neurological:     Mental Status: He is alert.  Psychiatric:        Mood and Affect: Mood normal.      Musculoskeletal Exam:  Left shoulder restricted overhead abduction and with external rotation, minimal tenderness to pressure more with movement, no palpable effusion Elbows full ROM no tenderness or swelling Wrists full ROM no tenderness or swelling, no pain with  percussion Tenderness to pressure on palm proximal to left 4th MCP, no focal swelling or nodule Fingers full ROM no tenderness, mild PIP and DIP Heberden's nodes bilaterally, right  3rd DIP mucinous cyst Knees full ROM no tenderness or swelling Ankles full ROM no tenderness or swelling  Investigation: No additional findings.  Imaging: No results found.  Recent Labs: Lab Results  Component Value Date   WBC 6.3 05/18/2023   HGB 15.3 05/18/2023   PLT 159 05/18/2023   NA 142 10/25/2021   K 4.2 10/25/2021   CL 109 (H) 10/25/2021   CO2 18 (L) 10/25/2021   GLUCOSE 99 10/25/2021   BUN 17 10/25/2021   CREATININE 1.30 (H) 05/07/2023   BILITOT 0.5 05/12/2016   ALKPHOS 55 05/12/2016   AST 40 (H) 05/12/2016   ALT 38 05/12/2016   PROT 6.6 05/12/2016   ALBUMIN  3.9 05/12/2016   CALCIUM  9.2 10/25/2021   GFRAA 78 12/15/2016   QFTBGOLDPLUS NEGATIVE 06/11/2023    Speciality Comments: No specialty comments available.  Procedures:  No procedures performed Allergies: Codeine, Clopidogrel , Flexeril [cyclobenzaprine], Sulfamethoxazole-trimethoprim, Morphine  sulfate, and Morphine  and codeine   Assessment / Plan:     Visit Diagnoses: Psoriatic arthritis (HCC) - Plan: Sedimentation rate, C-reactive protein Psoriasis Psoriasis remains very well-controlled no flareup of skin disease note new nail involvement.  No active synovitis or dactylitis on exam today.  Previously had been on Humira but without major flareups off treatment and with normal serum inflammatory markers.  Will recheck this workup again today would have low threshold to resume some type of DMARD if elevated without another underlying cause such as hyperuricemia. - Checking sed rate and CRP for disease activity monitoring  Rotator cuff tear arthropathy of left shoulder Restricted range of motion stable as before some pain relief after intra-articular steroid injection with orthopedics clinic.  Chronic gout involving toe of right  foot without tophus, unspecified cause - Plan: Uric acid Recent gout flare with swelling, redness, and sensitivity. Possible dehydration and dietary factors. Differential includes psoriatic arthritis as the distribution may be consistent with dactylitis. - Recheck uric acid levels.  Osteoarthritis of the hands with digital mucinous cysts Osteoarthritis with digital mucinous cysts, asymptomatic.  Trigger finger symptoms are currently doing well he is having less than weekly episodes and no focal tenderness or palpable nodule on exam today.  He is limited in treatment options due to chronic anticoagulation making him not a surgical candidate or option for systemic NSAIDs.       Orders: Orders Placed This Encounter  Procedures   Sedimentation rate   C-reactive protein   Uric acid   No orders of the defined types were placed in this encounter.    Follow-Up Instructions: Return in about 3 months (around 06/12/2024) for PsA/gout obs? f/u 3mos.   Lonni LELON Ester, MD  Note - This record has been created using [employer].  Chart creation errors have been sought, but may not always  have been located. Such creation errors do not reflect on  the standard of medical care.

## 2024-03-12 NOTE — Patient Instructions (Signed)
 Information for patients with Gout  Gout defined-Gout occurs when urate crystals accumulate in your joint causing the inflammation and intense pain of gout attack.  Urate crystals can form when you have high levels of uric acid in your blood.  Your body produces uric acid when it breaks down prurines-substances that are found naturally in your body, as well as in certain foods such as organ meats, anchioves, herring, asparagus, and mushrooms.  Normally uric acid dissolves in your blood and passes through your kidneys into your urine.  But sometimes your body either produces too much uric acid or your kidneys excrete too little uric acid.  When this happens, uric acid can build up, forming sharp needle-like urate crystals in a joint or surrounding tissue that cause pain, inflammation and swelling.    Gout is characterized by sudden, severe attacks of pain, redness and tenderness in joints, often the joint at the base of the big toe.  Gout is complex form of arthritis that can affect anyone.  Men are more likely to get gout but women become increasingly more susceptible to gout after menopause.  An acute attack of gout can wake you up in the middle of the night with the sensation that your big toe is on fire.  The affected joint is hot, swollen and so tender that even the weight or the sheet on it may seem intolerable.  If you experience symptoms of an acute gout attack it is important to your doctor as soon as the symptoms start.  Gout that goes untreated can lead to worsening pain and joint damage.  Risk Factors:  You are more likely to develop gout if you have high levels of uric acid in your body.    Factors that increase the uric acid level in your body include:  Lifestyle factors.  Excessive alcohol use-generally more than two drinks a day for men and more than one for women increase the risk of gout.  Medical conditions.  Certain conditions make it more likely that you will develop gout.   These include hypertension, and chronic conditions such as diabetes, high levels of fat and cholesterol in the blood, and narrowing of the arteries.  Certain medications.  The uses of Thiazide diuretics- commonly used to treat hypertension and low dose aspirin  can also increase uric acid levels.  Family history of gout.  If other members of your family have had gout, you are more likely to develop the disease.  Age and sex. Gout occurs more often in men than it does in women, primarily because women tend to have lower uric acid levels than men do.  Men are more likely to develop gout earlier usually between the ages of 8-50- whereas women generally develop signs and symptoms after menopause.    Tests and diagnosis:  Tests to help diagnose gout may include:  Blood test.  Your doctor may recommend a blood test to measure the uric acid level in your blood .  Blood tests can be misleading, though.  Some people have high uric acid levels but never experience gout.  And some people have signs and symptoms of gout, but don't have unusual levels of uric acid in their blood.  Joint fluid test.  Your doctor may use a needle to draw fluid from your affected joint.  When examined under the microscope, your joint fluid may reveal urate crystals.  Treatment:  Treatment for gout usually involves medications.  What medications you and your doctor choose will be  based on your current health and other medications you currently take.  Gout medications can be used to treat acute gout attacks and prevent future attacks as well as reduce your risk of complications from gout such as the development of tophi from urate crystal deposits.  Alternative medicine:   Certain foods have been studied for their potential to lower uric acid levels, including:  Coffee.  Studies have found an association between coffee drinking (regular and decaf) and lower uric acid levels.  The evidence is not enough to encourage  non-coffee drinkers to start, but it may give clues to new ways of treating gout in the future.  Vitamin C.  Supplements containing vitamin C may reduce the levels of uric acid in your blood.  However, vitamin as a treatment for gout. Don't assume that if a little vitamin C is good, than lots is better.  Megadoses of vitamin C may increase your bodies uric acid levels.  Cherries.  Cherries have been associated with lower levels of uric acid in studies, but it isn't clear if they have any effect on gout signs and symptoms.  Eating more cherries and other dar-colored fruits, such as blackberries, blueberries, purple grapes and raspberries, may be a safe way to support your gout treatment.    Lifestyle/Diet Recommendations:  Drink 8 to 16 cups ( about 2 to 4 liters) of fluid each day, with at least half being water. Avoid alcohol Eat a moderate amount of protein, preferably from healthy sources, such as low-fat or fat-free dairy, tofu, eggs, and nut butters. Limit you daily intake of meat, fish, and poultry to 4 to 6 ounces. Avoid high fat meats and desserts. Decrease you intake of shellfish, beef, lamb, pork, eggs and cheese. Choose a good source of vitamin C daily such as citrus fruits, strawberries, broccoli,  brussel sprouts, papaya, and cantaloupe.  Choose a good source of vitamin A every other day such as yellow fruits, or dark green/yellow vegetables. Avoid drastic weigh reduction or fasting.  If weigh loss is desired lose it over a period of several months. See dietary considerations.. chart for specific food recommendations.  Dietary Considerations for people with Gout  Food with negligible purine content (0-15 mg of purine nitrogen per 100 grams food)  May use as desired except on calorie variations  Non fat milk Cocoa Cereals (except in list II) Hard candies  Buttermilk Carbonated drinks Vegetables (except in list II) Sherbet  Coffee Fruits Sugar Honey  Tea Cottage Cheese  Gelatin-jell-o Salt  Fruit juice Breads Angel food Cake   Herbs/spices Jams/Jellies [employer]    Foods that do not contain excessive purine content, but must be limited due to fat content  Cream Eggs Oil and Salad Dressing  Half and Half Peanut Butter Chocolate  Whole Milk Cakes Potato Chips  Butter Ice Cream Fried Foods  Cheese Nuts Waffles, pancakes   List II: Food with moderate purine content (50-150 mg of purine nitrogen per 100 grams of food)  Limit total amount each day to 5 oz. cooked Lean meat, other than those on list III   Poultry, other than those on list III Fish, other than those on list III   Seafood, other than those on list III  These foods may be used occasionally  Peas Lentils Bran  Spinach Oatmeal Dried Beans and Peas  Asparagus Wheat Germ Mushrooms   Additional information about meat choices  Choose fish and poultry, particularly without skin, often.  Select lean, well  trimmed cuts of meat.  Avoid all fatty meats, bacon , sausage, fried meats, fried fish, or poultry, luncheon meats, cold cuts, hot dogs, meats canned or frozen in gravy, spareribs and frozen and packaged prepared meats.   List III: Foods with HIGH purine content / Foods to AVOID (150-800 mg of purine nitrogen per 100 grams of food)  Anchovies Herring Meat Broths  Liver Mackerel Meat Extracts  Kidney Scallops Meat Drippings  Sardines Wild Game Mincemeat  Sweetbreads Goose Gravy  Heart Tongue Yeast, baker's and brewers   Commercial soups made with any of the foods listed in List II or List III  In addition avoid all alcoholic beverages   Your finger nodules look most consistent for digital mucinous cysts and heberdon's nodes related to osteoarthritis.

## 2024-03-13 ENCOUNTER — Ambulatory Visit: Payer: Self-pay | Admitting: Internal Medicine

## 2024-03-13 LAB — C-REACTIVE PROTEIN: CRP: <3 mg/L (ref <8.0)

## 2024-03-13 LAB — URIC ACID: Uric Acid, Serum: 5.3 mg/dL (ref 4.0–8.0)

## 2024-03-13 LAB — SEDIMENTATION RATE: Sed Rate: 2 mm/h (ref 0–20)

## 2024-03-13 NOTE — Progress Notes (Signed)
 Sed rate and CRP are normal so what ever previous inflammation he had going on appears to be under control now it is nothing we need to start any additional long term drug at this time. Uric acid was 5.3 which is at the goal of less than 6.  I do not recommend that he needs to start any uric acid lowering medication at this time.

## 2024-03-20 DIAGNOSIS — I10 Essential (primary) hypertension: Secondary | ICD-10-CM | POA: Diagnosis not present

## 2024-03-20 DIAGNOSIS — Z8739 Personal history of other diseases of the musculoskeletal system and connective tissue: Secondary | ICD-10-CM | POA: Diagnosis not present

## 2024-03-20 DIAGNOSIS — R197 Diarrhea, unspecified: Secondary | ICD-10-CM | POA: Diagnosis not present

## 2024-04-01 ENCOUNTER — Other Ambulatory Visit: Payer: Self-pay | Admitting: Cardiovascular Disease

## 2024-04-01 DIAGNOSIS — I480 Paroxysmal atrial fibrillation: Secondary | ICD-10-CM

## 2024-04-02 ENCOUNTER — Telehealth: Payer: Self-pay | Admitting: Gastroenterology

## 2024-04-02 ENCOUNTER — Other Ambulatory Visit

## 2024-04-02 ENCOUNTER — Ambulatory Visit: Admitting: Gastroenterology

## 2024-04-02 ENCOUNTER — Ambulatory Visit
Admission: RE | Admit: 2024-04-02 | Discharge: 2024-04-02 | Disposition: A | Discharge status: Home / Self Care | Source: Ambulatory Visit | Attending: Gastroenterology | Admitting: Gastroenterology

## 2024-04-02 ENCOUNTER — Other Ambulatory Visit (HOSPITAL_COMMUNITY): Payer: Self-pay

## 2024-04-02 ENCOUNTER — Encounter: Payer: Self-pay | Admitting: Gastroenterology

## 2024-04-02 ENCOUNTER — Other Ambulatory Visit: Payer: Self-pay

## 2024-04-02 VITALS — BP 118/72 | HR 63 | Ht 72.0 in | Wt 212.6 lb

## 2024-04-02 DIAGNOSIS — R110 Nausea: Secondary | ICD-10-CM | POA: Diagnosis not present

## 2024-04-02 DIAGNOSIS — R109 Unspecified abdominal pain: Secondary | ICD-10-CM | POA: Diagnosis not present

## 2024-04-02 DIAGNOSIS — G8929 Other chronic pain: Secondary | ICD-10-CM | POA: Diagnosis not present

## 2024-04-02 DIAGNOSIS — R197 Diarrhea, unspecified: Secondary | ICD-10-CM | POA: Diagnosis not present

## 2024-04-02 DIAGNOSIS — K909 Intestinal malabsorption, unspecified: Secondary | ICD-10-CM

## 2024-04-02 DIAGNOSIS — K862 Cyst of pancreas: Secondary | ICD-10-CM | POA: Diagnosis not present

## 2024-04-02 DIAGNOSIS — R194 Change in bowel habit: Secondary | ICD-10-CM

## 2024-04-02 MED ORDER — APIXABAN 5 MG PO TABS
5.0000 mg | ORAL_TABLET | Freq: Two times a day (BID) | ORAL | 5 refills | Status: AC
  Filled 2024-04-02: qty 60, 30d supply, fill #0
  Filled 2024-05-12: qty 60, 30d supply, fill #1
  Filled 2024-06-07: qty 60, 30d supply, fill #2
  Filled 2024-07-13: qty 60, 30d supply, fill #3
  Filled 2024-07-27 – 2024-08-02 (×2): qty 60, 30d supply, fill #4

## 2024-04-02 NOTE — Progress Notes (Signed)
 GASTROENTEROLOGY OUTPATIENT CLINIC VISIT   Primary Care Provider Rolinda Millman, MD [address] [place] [address] [phone]  Patient Profile: [name] is a 73 y.o. male with a pmh significant for CAD (status post CABG plus AVR), diabetes, PAD (status post femoral pop bypass), OSA, hypertension, hyperlipidemia, thoracic aneurysm, MDD, arthritis, colon polyps (TA's), hemorrhoids, pancreatic cyst, family history pancreas cancer (brother).  The patient presents to the [facility] for an evaluation and management of problem(s) noted below:  Problem List 1. Change in bowel habits   2. Steatorrhea   3. Queasiness   4. Pancreatic cyst    Discussed the use of AI scribe software for clinical note transcription with the patient, who gave verbal consent to proceed.  History of Present Illness [name] is a 73 year old male who presents for follow-up of chronic gastrointestinal symptoms and lethargy after bowel movements.  He is accompanied by his wife for today's visit.  He states that he has experienced gastrointestinal issues for over six years; initially resembling possible irritable bowel syndrome.  Symptoms at this time include daily episodes of lethargy and sleepiness, often accompanied by a 'twinge' in the stomach after he attempts having a bowel movement.  Most often there is some straining that occurs with this.  Post-defecation, he frequently feels exhausted and needs to rest.  He takes two Fibercon tablets each morning to aid bowel movements, which he believes helps somewhat. Despite this, he often feels the need to return home due to overwhelming tiredness. Bowel movements are sometimes normal, but more often start formed and end with diarrhea or are purely constipated.  He has observed oiliness in his stools for the past six years, with occasional floating stools. He previously used Hydrocil (reported laxative) which was  helpful at times, but he no longer uses it.  He also tried Metamucil without success.  He experiences discomfort after eating, such as when consuming a bagel, which feels like 'a brick' in his stomach, with pain sometimes localized to the side.  His pancreatic cyst on recent imaging has been stable with plan for a 1-year follow-up MRI/MRCP.   GI Review of Systems Positive as above Negative for dysphagia, odynophagia, melena, hematochezia  Review of Systems General: Denies fevers/chills/weight loss unintentionally Cardiovascular: Denies chest pain Pulmonary: Denies shortness of breath Gastroenterological: See HPI Genitourinary: Denies darkened urine Hematological: Denies easy bruising/bleeding Dermatological: Denies jaundice Psychological: Mood is stable  Medications Current Outpatient Medications  Medication Sig Dispense Refill   Accu-Chek Softclix Lancets lancets as directed finger stick once a day; Duration: 90 days     apixaban  (ELIQUIS ) 5 MG TABS tablet Take 1 tablet (5 mg total) by mouth 2 (two) times daily. 60 tablet 5   atorvastatin  (LIPITOR) 80 MG tablet Take one tablet (80 mg dose) by mouth daily. (Patient taking differently: 40 mg at bedtime.) 90 tablet 3   Blood Glucose Calibration (ACCU-CHEK GUIDE CONTROL VI) as directed In Vitro once a day; Duration: 90 days     cetirizine (ZYRTEC) 10 MG tablet 1 tablet Orally Once a day for 90 days     Cholecalciferol (VITAMIN D-3 PO) Take 1 tablet by mouth daily.     citalopram  (CELEXA ) 20 MG tablet Take 20 mg by mouth daily.     citalopram  (CELEXA ) 40 MG tablet 0.5 tablet Orally Once a day     colchicine  0.6 MG tablet daily as needed.     Cyanocobalamin  (VITAMIN B-12 PO) Take 1  tablet by mouth daily.     dapagliflozin  propanediol (FARXIGA ) 10 MG TABS tablet Take 1 tablet (10 mg total) by mouth daily. 90 tablet 0   enalapril  (VASOTEC ) 2.5 MG tablet Take 2.5 mg by mouth daily.     ezetimibe  (ZETIA ) 10 MG tablet Take 1 tablet (10 mg total)  by mouth daily. 90 tablet 3   fluticasone (FLONASE) 50 MCG/ACT nasal spray Place 2 sprays into both nostrils daily.     lansoprazole (PREVACID) 15 MG capsule Take 15 mg by mouth daily as needed. For acid reflux     levETIRAcetam (KEPPRA) 500 MG tablet Take 500 mg by mouth 2 (two) times daily.     metoprolol  tartrate (LOPRESSOR ) 50 MG tablet TAKE 1/2 TABLET TWICE DAILY 90 tablet 2   tamsulosin (FLOMAX) 0.4 MG CAPS capsule Take 0.4 mg by mouth daily.      traMADol  (ULTRAM ) 50 MG tablet Take 1 tablet (50 mg total) by mouth every 8 (eight) hours as needed. 30 tablet 0   amoxicillin  (AMOXIL ) 500 MG capsule TAKE 4 CAPSULES ONE HOUR BEFORE DENTAL APPOINTMENT. (Patient not taking: Reported on 04/02/2024) 8 capsule 0   No current facility-administered medications for this visit.    Allergies Allergies  Allergen Reactions   Codeine Other (See Comments)    Blood pressure drops  Other Reaction(s): Heart Rate & BP Drops   Clopidogrel  Itching   Flexeril [Cyclobenzaprine] Other (See Comments)    sedation   Sulfamethoxazole-Trimethoprim Other (See Comments)    Dizziness, flushed   Morphine  Sulfate     Other Reaction(s): Itching   Morphine  And Codeine Itching    Histories Past Medical History:  Diagnosis Date   Aortic stenosis    a. severe bicuspid AV stenosis s/p pericardial tissue valve 2012.   Arthritis    Coronary artery disease    a. s/p CABG (LIMA-LCx) at time of AVR 2012, mild nonobstructive LAD and RCA stenoses. // Myoview  03/2022: EF 48, no ischemia; low risk   Depression    Depression    Diabetes mellitus without complication [facility])    ED (erectile dysfunction)    Encounter for long-term (current) use of high-risk medication 06/30/2015   Fatty liver    Femoral-popliteal bypass graft occlusion, right [facility]) 1997   Dr. 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   Obesity    Obstructive sleep apnea 01/10/2021   PAD (peripheral artery disease) (HCC)    PONV (postoperative nausea and vomiting)    PVD (peripheral vascular disease) (HCC)    Stroke (HCC) 03/28/2023   Thoracic aortic aneurysm (HCC) 05/09/2022   Echocardiogram 10/23: EF 55-60, no RWMA, mild LVH, normal RVSF, mild MR, s/p AVR with mild AI and normal gradient (15 mmHg), mod dilation of aortic root and ascending aorta (45 mm).  Chest CT 10/23: Ascending thoracic aorta 4.1 cm; aortic atherosclerosis   Past Surgical History:  Procedure Laterality Date   AORTIC VALVE REPLACEMENT  07/18/2011   Procedure: AORTIC VALVE REPLACEMENT (AVR);  Surgeon: Dallas KATHEE Jude, MD;  Location:  [facility] OR;  Service: Open Heart Surgery;  Laterality: N/A;   CARDIAC CATHETERIZATION  2009, 07/11/11   Dr. Dann   CORONARY ARTERY BYPASS GRAFT  07/18/2011   Procedure: CORONARY ARTERY BYPASS GRAFTING (CABG);  Surgeon: Dallas KATHEE Jude, MD;  Location: [facility] OR;  Service: Open Heart Surgery;  Laterality: N/A;  times one to mammary artery, left   fem stent Right 2017   FEMORAL ARTERY - POPLITEAL ARTERY BYPASS GRAFT     KNEE ARTHROSCOPY     left   MRI     to visualize aortic valve   PERIPHERAL VASCULAR CATHETERIZATION N/A 07/12/2016   Procedure: Abdominal Aortogram w/Lower Extremity;  Surgeon: Penne Lonni Colorado, MD;  Location: [facility] INVASIVE CV LAB;  Service: Cardiovascular;  Laterality: N/A;   PERIPHERAL VASCULAR CATHETERIZATION Right 07/12/2016   Procedure: Peripheral Vascular Atherectomy;  Surgeon: Penne Lonni Colorado, MD;  Location: [facility] INVASIVE CV LAB;  Service: Cardiovascular;  Laterality: Right;  Anterior Tibial and Popliteal   ROTATOR CUFF REPAIR     Left   US  ECHOCARDIOGRAPHY     Social  History   Socioeconomic History   Marital status: Divorced    Spouse name: Not on file   Number of children: 4   Years of education: Not on file   Highest education level: Bachelor's degree (e.g., BA, AB, BS)  Occupational History   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[employer]   Tobacco Use   Smoking status: Never    Passive exposure: Never   Smokeless tobacco: Never   Tobacco comments:    Never smoked 05/18/23  Vaping Use   Vaping status: Never Used  Substance and Sexual Activity   Alcohol use: No   Drug use: No   Sexual activity: Not on file  Other Topics Concern   Not on file  Social History Narrative   Lives at home with friend Heron Epp   Left handed   Caffeine: about 1 cups daily   Social Drivers of Health   Financial Resource Strain: Low Risk  (03/30/2023)   Received from [facility]   Overall Financial Resource Strain (CARDIA)    Difficulty of Paying Living Expenses: Not hard at all  Food Insecurity: No Food Insecurity (10/08/2023)   [facility]   Hunger Vital Sign    Within the past 12 months, you worried that your food would run out before you got the money to buy more.: Never true    Within the past 12 months, the food you bought just didn't last and you didn't have money to get more.: Never true  Transportation Needs: No Transportation Needs (10/08/2023)   Received from [facility] - Transportation    Lack of Transportation (Medical): No    Lack of Transportation (Non-Medical): No  Physical Activity: Sufficiently Active (10/08/2023)   Received from [facility]   Exercise Vital Sign    On average, how many days per week do you engage in moderate to strenuous exercise (like a brisk walk)?: 6 days    On average, how many minutes do you engage in exercise at this level?: 150+ min  Stress: No Stress Concern Present (10/08/2023)   Received from [facility] - Occupational Stress  Questionnaire    Feeling of Stress : Not at all  Social Connections: Moderately Integrated (10/08/2023)   Received from [facility]   Social Network    How would you rate your social  network (family, work, friends)?: Adequate participation with social networks  Intimate Partner Violence: Unknown (10/08/2023)   Received from [facility]   HITS    Physically Hurt: Not on file    Over the last 12 months how often did your partner insult you or talk down to you?: Patient declined    Over the last 12 months how often did your partner threaten you with physical harm?: Patient declined    Over the last 12 months how often did your partner scream or curse at you?: Patient declined   Family History  Problem Relation Age of Onset   Hypertension Father    Diabetes Father    Lung cancer Father    Breast cancer Sister    Pancreatic cancer Brother    Heart disease Maternal Grandmother    Cancer Maternal Grandfather    Liver disease Neg Hx    Colon cancer Neg Hx    Esophageal cancer Neg Hx    Rectal cancer Neg Hx    Stomach cancer Neg Hx    Inflammatory bowel disease Neg Hx    I have reviewed his medical, social, and family history in detail and updated the electronic medical record as necessary.    PHYSICAL EXAMINATION  BP 118/72   Pulse 63   Ht 6' (1.829 m)   Wt 212 lb 9.6 oz (96.4 kg)   BMI 28.83 kg/m  Wt Readings from Last 3 Encounters:  04/02/24 212 lb 9.6 oz (96.4 kg)  03/12/24 209 lb (94.8 kg)  01/25/24 202 lb (91.6 kg)  GEN: NAD, appears stated age, doesn't appear chronically ill PSYCH: Cooperative, without pressured speech EYE: Conjunctivae pink, sclerae anicteric ENT: MMM CV: Nontachycardic RESP: No audible wheezing GI: NABS, soft, ventral diastases noted, nontender, without rebound or guarding  MSK/EXT: No significant lower extremity edema SKIN: No jaundice NEURO:  Alert & Oriented x 3, no focal deficits   REVIEW OF DATA  I reviewed the following data at the  time of this encounter:  GI Procedures and Studies  June 2025 colonoscopy - Hemorrhoids found on digital rectal exam. - Three 2 to 3 mm polyps in the rectum, at the recto-sigmoid colon and in the ascending colon, removed with a cold snare. Resected and retrieved. - Normal mucosa in the entire examined colon otherwise. - Non-bleeding non-thrombosed internal hemorrhoids  Laboratory Studies  Reviewed those in epic  Imaging Studies  November 2024 MRI/MRCP IMPRESSION: Unchanged fluid signal cystic lesion within the tip of the pancreatic tail measuring 1.7 x 1.3 cm which communicates with the adjacent main pancreatic duct. No solid component or suspicious contrast enhancement. No pancreatic ductal dilatation or surrounding inflammatory changes. This is most consistent with a side branch IPMN. Although exceedingly likely to be benign, especially given well established imaging stability, consider one additional follow-up examination in 2 years to establish a total of 5 years stability and benign nature, after which no further follow-up or characterization is required.   ASSESSMENT/PLAN  Mr. Yohn is a 73 y.o. male.  The patient is seen today for evaluation and management of:  1. Change in bowel habits   2. Steatorrhea   3. Queasiness   4. Pancreatic cyst    Overall, the patient is hemodynamically stable.  Clinically there are some symptoms that we need further workup as below.  Changes in bowel habits Suspect for a of a chronic constipation with possible overflow playing a role.  Vagal never stimulation leading to lethargy could be possible as well.  No current use of daily laxatives, only Fibercon as a prebiotic. - Order two-view abdominal x-ray to assess stool burden - Initiate daily Miralax for three weeks to improve bowel regularity - Continue Fibercon in the morning - Order stool test for H. pylori infection   Possible Steatorrhea (fatty/oily stools) Presence of oily stools  for six years, with occasional floating stools. Differential includes pancreatic enzyme insufficiency. Pancreatic cyst noted but not currently concerning.  This could be nothing, so testing will help evaluate further. - Order stool tests to evaluate pancreatic enzyme levels and fat content - SIBO breath testing to be performed   Pancreatic Cyst - Follow-up MRI/MRCP in 2026         PLAN  There are no diagnoses linked to this encounter.   Orders Placed This Encounter  Procedures   DG Abd 2 Views   Pancreatic elastase, fecal   Fecal fat, qualitative   Other/Misc lab test    New Prescriptions   No medications on file   Modified Medications   No medications on file    Planned Follow Up No follow-ups on file.   Total Time in Face-to-Face and in Coordination of Care for patient including independent/personal interpretation/review of prior testing, medical history, examination, medication adjustment, communicating results with the patient directly, and documentation within the EHR is 30 minutes.   Aloha Finner, MD [facility] Office # [phone]

## 2024-04-02 NOTE — Telephone Encounter (Signed)
 Inbound call from patient stating he has an [facility] test that needs to be done and its stating he has to be off any laxatives before doing the test. Patient is stating he was told to take miralax and would like to speak to nurse in regards to if he has to hold off on taking it for him to get that test done.patient was seen in office today 04/02/24.   Requesting a call back  Please advise  Thank you

## 2024-04-02 NOTE — Patient Instructions (Addendum)
 Your provider has requested that you go to the basement level for lab work before leaving today. Press B on the elevator. The lab is located at the first door on the left as you exit the elevator.   You have been given a testing kit to check for small intestine bacterial overgrowth (SIBO) which is completed by a company named [facility]. Make sure to return your test in the mail using the return mailing label given to you along with the kit. The test order, your demographic and insurance information have all already been sent to the company. [facility] will collect an upfront charge of $109.00 for commercial insurance plans and $229.00 if you are paying cash. The potential remaining total after claim submission and review is $120.00. Make sure to discuss with [facility] PRIOR to having the test to see if they have gotten information from your insurance company as to how much your testing will cost out of pocket, if any. Please contact [facility] at phone number [phone] to get instructions regarding how to perform the test as our office is unable to give specific testing instructions.   Your provider has requested that you have an abdominal x ray before leaving today. Please go to the basement floor to our Radiology department for the test.  Please purchase the following medications over the counter and take as directed: Miralax 1 capful daily dissolved in at least 8 ounces of water daily.   Send my chart message in 1 month with update on symptoms.   Due to recent changes in healthcare laws, you may see the results of your imaging and laboratory studies on MyChart before your provider has had a chance to review them.  We understand that in some cases there may be results that are confusing or concerning to you. Not all laboratory results come back in the same time frame and the provider may be waiting for multiple results in order to interpret others.  Please give us  48 hours  in order for your provider to thoroughly review all the results before contacting the office for clarification of your results.   Thank you for choosing me and [facility].  Dr. Wilhelmenia

## 2024-04-02 NOTE — Telephone Encounter (Signed)
 Eliquis  5mg  refill request received. Patient is 73 years old, weight-94.8kg, Crea-1.30 on 05/07/23, Diagnosis-Afib, and last seen by [name] on 11/19/23. Dose is appropriate based on dosing criteria. Will send in refill to requested pharmacy.

## 2024-04-03 DIAGNOSIS — R140 Abdominal distension (gaseous): Secondary | ICD-10-CM | POA: Diagnosis not present

## 2024-04-03 NOTE — Telephone Encounter (Signed)
 Ro this pt was seen in the office yesterday and I am not sure what he was told about testing. Can you let me know about the miralax or should he follow what [facility] told him?

## 2024-04-03 NOTE — Telephone Encounter (Signed)
 Returned call to patient. Informed patient that he can use Miralax as directed. No contradictions with taking SIBO test. Patient voiced understanding.

## 2024-04-04 ENCOUNTER — Ambulatory Visit: Payer: Self-pay | Admitting: Gastroenterology

## 2024-04-04 DIAGNOSIS — R5383 Other fatigue: Secondary | ICD-10-CM | POA: Diagnosis not present

## 2024-04-04 LAB — FECAL FAT, QUALITATIVE
Fat Qual Neutral, Stl: NORMAL
Fat Qual Total, Stl: NORMAL

## 2024-04-05 ENCOUNTER — Encounter: Payer: Self-pay | Admitting: Gastroenterology

## 2024-04-05 DIAGNOSIS — K862 Cyst of pancreas: Secondary | ICD-10-CM | POA: Insufficient documentation

## 2024-04-05 DIAGNOSIS — R194 Change in bowel habit: Secondary | ICD-10-CM | POA: Insufficient documentation

## 2024-04-05 DIAGNOSIS — R110 Nausea: Secondary | ICD-10-CM | POA: Insufficient documentation

## 2024-04-05 DIAGNOSIS — K909 Intestinal malabsorption, unspecified: Secondary | ICD-10-CM | POA: Insufficient documentation

## 2024-04-07 DIAGNOSIS — R569 Unspecified convulsions: Secondary | ICD-10-CM | POA: Diagnosis not present

## 2024-04-07 DIAGNOSIS — I6521 Occlusion and stenosis of right carotid artery: Secondary | ICD-10-CM | POA: Diagnosis not present

## 2024-04-07 DIAGNOSIS — H539 Unspecified visual disturbance: Secondary | ICD-10-CM | POA: Diagnosis not present

## 2024-04-08 LAB — PANCREATIC ELASTASE, FECAL: Pancreatic Elastase-1, Stool: >800 ug/g (ref >200)

## 2024-04-16 ENCOUNTER — Encounter: Payer: Self-pay | Admitting: Gastroenterology

## 2024-04-16 NOTE — Progress Notes (Signed)
 H. pylori [facility] testing  Negative for H. pylori.  We will scanned this into the chart and make a copy for the patient for his records and update him.  Aloha Finner, MD  [facility] Office # [phone]

## 2024-04-16 NOTE — Progress Notes (Signed)
 [facility] SIBO breath test  Hydrogen 44 ppm Methane 2 ppm Increasing combined hydrogen and methane 46 ppm  Bacterial overgrowth is suspected.  I will forward this to my team. We will update the patient and send a copy of this report to him for his records as well as being scanned.  Recommend patient have Xifaxan 550 mg 3 times daily x 10 days.  If this is not financially feasible, then we can consider other antibiotics.  Aloha Finner, MD [facility] # [phone]

## 2024-04-25 ENCOUNTER — Encounter: Payer: Self-pay | Admitting: Gastroenterology

## 2024-04-26 ENCOUNTER — Other Ambulatory Visit: Payer: Self-pay | Admitting: Orthopedic Surgery

## 2024-05-07 ENCOUNTER — Encounter: Payer: Self-pay | Admitting: Orthopaedic Surgery

## 2024-05-07 ENCOUNTER — Ambulatory Visit: Admitting: Orthopaedic Surgery

## 2024-05-07 DIAGNOSIS — G8929 Other chronic pain: Secondary | ICD-10-CM

## 2024-05-07 DIAGNOSIS — M25511 Pain in right shoulder: Secondary | ICD-10-CM

## 2024-05-07 DIAGNOSIS — M25512 Pain in left shoulder: Secondary | ICD-10-CM

## 2024-05-07 MED ORDER — LIDOCAINE HCL 1 % IJ SOLN
3.0000 mL | INTRAMUSCULAR | Status: AC | PRN
  Administered 2024-05-07: 3 mL

## 2024-05-07 MED ORDER — METHYLPREDNISOLONE ACETATE 40 MG/ML IJ SUSP
40.0000 mg | INTRAMUSCULAR | Status: AC | PRN
  Administered 2024-05-07: 40 mg via INTRA_ARTICULAR

## 2024-05-07 NOTE — Progress Notes (Signed)
 The patient is a 73 year old patient of my partner Dr. Addie who was worked into my schedule today requesting steroid injection in both shoulders.  He is a type II diabetic but has excellent control of his blood glucose and does not take medications for this.  I did review his chart and he has well-documented rotator cuff arthropathy of the left shoulder.  He is actually requesting a steroid injection in both shoulders today.  He had no acute changes in medical status.  Again has been at least 4 months since his last injection in the left shoulder by Dr. Addie.  Exam he has obvious limitations of his range of motion of his left shoulder but much better range of motion of the right shoulder.  He does show rotator cuff arthropathy on just exam of his left shoulder for the right shoulder rotator cuff appears to be intact and shows more signs of impingement on exam.  There is no blocks to motion and he abducts his shoulder appropriately.  This is on the right side.  The left side again is significantly weak with limited motion as well.  Per the patient's request I did place a steroid injection in the left shoulder subacromial Alex that he tolerated very well.  He knows to watch his blood glucose closely.  He also knows to wait 3 to 4 months between steroid injections.  All questions and concerns were addressed and answered.    Procedure Note  Patient: [name]             Date of Birth: Dec 31, 1950           MRN: [id number]             Visit Date: 05/07/2024  Procedures: Visit Diagnoses:  1. Chronic left shoulder pain   2. Chronic right shoulder pain     Large Joint Inj: R subacromial bursa on 05/07/2024 9:06 AM Indications: pain and diagnostic evaluation Details: 22 G 1.5 in needle  Arthrogram: No  Medications: 3 mL lidocaine  1 %; 40 mg methylPREDNISolone  acetate 40 MG/ML Outcome: tolerated well, no immediate complications Procedure, treatment alternatives, risks and benefits explained,  specific risks discussed. Consent was given by the patient. Immediately prior to procedure a time out was called to verify the correct patient, procedure, equipment, support staff and site/side marked as required. Patient was prepped and draped in the usual sterile fashion.    Large Joint Inj: L subacromial bursa on 05/07/2024 9:06 AM Indications: pain and diagnostic evaluation Details: 22 G 1.5 in needle  Arthrogram: No  Medications: 3 mL lidocaine  1 %; 40 mg methylPREDNISolone  acetate 40 MG/ML Outcome: tolerated well, no immediate complications Procedure, treatment alternatives, risks and benefits explained, specific risks discussed. Consent was given by the patient. Immediately prior to procedure a time out was called to verify the correct patient, procedure, equipment, support staff and site/side marked as required. Patient was prepped and draped in the usual sterile fashion.

## 2024-05-09 ENCOUNTER — Ambulatory Visit (HOSPITAL_BASED_OUTPATIENT_CLINIC_OR_DEPARTMENT_OTHER)
Admission: RE | Admit: 2024-05-09 | Discharge: 2024-05-09 | Disposition: A | Discharge status: Home / Self Care | Payer: BC Managed Care – PPO | Source: Ambulatory Visit | Attending: Physician Assistant | Admitting: Physician Assistant

## 2024-05-09 ENCOUNTER — Ambulatory Visit: Payer: Self-pay | Admitting: Physician Assistant

## 2024-05-09 DIAGNOSIS — I77810 Thoracic aortic ectasia: Secondary | ICD-10-CM | POA: Insufficient documentation

## 2024-05-09 DIAGNOSIS — I700 Atherosclerosis of aorta: Secondary | ICD-10-CM | POA: Diagnosis not present

## 2024-05-09 DIAGNOSIS — I7121 Aneurysm of the ascending aorta, without rupture: Secondary | ICD-10-CM | POA: Diagnosis not present

## 2024-05-09 DIAGNOSIS — K449 Diaphragmatic hernia without obstruction or gangrene: Secondary | ICD-10-CM | POA: Diagnosis not present

## 2024-05-09 LAB — POCT I-STAT CREATININE: Creatinine, Ser: 1.3 mg/dL — ABNORMAL HIGH (ref 0.61–1.24)

## 2024-05-09 MED ORDER — IOHEXOL 350 MG/ML SOLN
100.0000 mL | Freq: Once | INTRAVENOUS | Status: AC | PRN
  Administered 2024-05-09: 100 mL via INTRAVENOUS

## 2024-05-12 ENCOUNTER — Telehealth: Payer: Self-pay | Admitting: Gastroenterology

## 2024-05-12 ENCOUNTER — Other Ambulatory Visit: Payer: Self-pay | Admitting: Cardiovascular Disease

## 2024-05-12 ENCOUNTER — Ambulatory Visit: Payer: Self-pay | Admitting: Physician Assistant

## 2024-05-12 DIAGNOSIS — I7121 Aneurysm of the ascending aorta, without rupture: Secondary | ICD-10-CM

## 2024-05-12 NOTE — Telephone Encounter (Signed)
 Inbound call from patient stating he had a ct scan done today and would like to know what Dr.Mansouraty will advise him to do. Requesting a call back  Please advise  Thank you

## 2024-05-13 ENCOUNTER — Other Ambulatory Visit (HOSPITAL_COMMUNITY): Payer: Self-pay

## 2024-05-13 NOTE — Telephone Encounter (Signed)
 See results note dated 10/14

## 2024-05-15 ENCOUNTER — Ambulatory Visit: Admitting: Orthopedic Surgery

## 2024-05-19 ENCOUNTER — Other Ambulatory Visit (HOSPITAL_COMMUNITY): Payer: Self-pay

## 2024-05-20 ENCOUNTER — Other Ambulatory Visit (HOSPITAL_COMMUNITY): Payer: Self-pay

## 2024-05-20 ENCOUNTER — Other Ambulatory Visit: Payer: Self-pay | Admitting: Cardiovascular Disease

## 2024-05-20 MED ORDER — DAPAGLIFLOZIN PROPANEDIOL 10 MG PO TABS
10.0000 mg | ORAL_TABLET | Freq: Every day | ORAL | 0 refills | Status: DC
  Filled 2024-05-20: qty 30, 30d supply, fill #0
  Filled 2024-06-07 – 2024-06-12 (×2): qty 30, 30d supply, fill #1
  Filled 2024-07-13: qty 30, 30d supply, fill #2

## 2024-05-21 ENCOUNTER — Ambulatory Visit (HOSPITAL_COMMUNITY): Admitting: Internal Medicine

## 2024-05-27 ENCOUNTER — Encounter (HOSPITAL_COMMUNITY): Payer: Self-pay | Admitting: Internal Medicine

## 2024-05-27 ENCOUNTER — Ambulatory Visit (HOSPITAL_COMMUNITY)
Admission: RE | Admit: 2024-05-27 | Discharge: 2024-05-27 | Disposition: A | Discharge status: Home / Self Care | Source: Ambulatory Visit | Attending: Internal Medicine | Admitting: Internal Medicine

## 2024-05-27 VITALS — BP 130/90 | HR 84 | Ht 72.0 in | Wt 215.8 lb

## 2024-05-27 DIAGNOSIS — I480 Paroxysmal atrial fibrillation: Secondary | ICD-10-CM

## 2024-05-27 DIAGNOSIS — D6869 Other thrombophilia: Secondary | ICD-10-CM

## 2024-05-27 NOTE — Progress Notes (Signed)
 Primary Care Physician: Rolinda Millman, MD Primary Cardiologist: Ozell Fell, MD Electrophysiologist: None     Referring Physician: Dr. Fell Taft [name] is a 73 y.o. male with a history of T2DM, HTN, HLD, PAD with history of femoral-popliteal bypass graft occlusion, GERD, OSA on CPAP, bicuspid aortic valve, aortic stenosis s/p valve replacement 2012, CAD s/p CABG 2012, CVA 8/24, and paroxysmal atrial fibrillation who presents for consultation in the [facility]. Seen by Dr. Fell on 9/5 and with recent CVA placed 30-day monitor; alert for new onset Afib with RVR. Discontinued plavix . Patient is on Eliquis  for a CHADS2VASC score of 6.  On follow up 05/27/24, patient is currently in NSR. He has had overall very low Afib burden. He wears an Apple watch and it shows 2% or less consistently. No bleeding issues on Eliquis .   Today, he denies symptoms of palpitations, chest pain, shortness of breath, orthopnea, PND, lower extremity edema, dizziness, presyncope, syncope, snoring, daytime somnolence, bleeding, or neurologic sequela. The patient is tolerating medications without difficulties and is otherwise without complaint today.    Atrial Fibrillation Risk Factors:  he does have symptoms or diagnosis of sleep apnea. he is compliant with CPAP therapy.  he has a BMI of Body mass index is 29.27 kg/m.[name] Filed Weights   05/27/24 0941  Weight: 97.9 kg     Current Outpatient Medications  Medication Sig Dispense Refill   Accu-Chek Softclix Lancets lancets as directed finger stick once a day; Duration: 90 days     amoxicillin  (AMOXIL ) 500 MG capsule TAKE 4 CAPSULES ONE HOUR BEFORE DENTAL APPOINTMENT. 8 capsule 0   apixaban  (ELIQUIS ) 5 MG TABS tablet Take 1 tablet (5 mg total) by mouth 2 (two) times daily. 60 tablet 5   atorvastatin  (LIPITOR) 80 MG tablet Take one tablet (80 mg dose) by mouth daily. (Patient taking differently: 40 mg at bedtime.) 90  tablet 3   Blood Glucose Calibration (ACCU-CHEK GUIDE CONTROL VI) as directed In Vitro once a day; Duration: 90 days     cetirizine (ZYRTEC) 10 MG tablet 1 tablet Orally Once a day for 90 days     Cholecalciferol (VITAMIN D-3 PO) Take 1 tablet by mouth daily.     citalopram  (CELEXA ) 20 MG tablet Take 20 mg by mouth daily.     citalopram  (CELEXA ) 40 MG tablet 0.5 tablet Orally Once a day     colchicine  0.6 MG tablet daily as needed.     Cyanocobalamin  (VITAMIN B-12 PO) Take 1 tablet by mouth daily.     dapagliflozin  propanediol (FARXIGA ) 10 MG TABS tablet Take 1 tablet (10 mg total) by mouth daily. 90 tablet 0   enalapril  (VASOTEC ) 2.5 MG tablet Take 2.5 mg by mouth daily.     ezetimibe  (ZETIA ) 10 MG tablet Take 1 tablet (10 mg total) by mouth daily. 90 tablet 1   fluticasone (FLONASE) 50 MCG/ACT nasal spray Place 2 sprays into both nostrils daily.     lansoprazole (PREVACID) 15 MG capsule Take 15 mg by mouth daily as needed. For acid reflux     levETIRAcetam (KEPPRA) 500 MG tablet Take 500 mg by mouth 2 (two) times daily.     metoprolol  tartrate (LOPRESSOR ) 50 MG tablet TAKE 1/2 TABLET TWICE DAILY 90 tablet 1   tamsulosin (FLOMAX) 0.4 MG CAPS capsule Take 0.4 mg by mouth daily.      traMADol  (ULTRAM ) 50 MG tablet Take 1 tablet (50 mg total) by  mouth daily as needed. 30 tablet 0   No current facility-administered medications for this encounter.    Atrial Fibrillation Management history:  Previous antiarrhythmic drugs: None Previous cardioversions: None Previous ablations: None Anticoagulation history: Eliquis    ROS- All systems are reviewed and negative except as per the HPI above.  Physical Exam: BP (!) 130/90   Pulse 84   Ht 6' (1.829 m)   Wt 97.9 kg   BMI 29.27 kg/m   GEN- The patient is well appearing, alert and oriented x 3 today.   Neck - no JVD or carotid bruit noted Lungs- Clear to ausculation bilaterally, normal work of breathing Heart- Regular rate and rhythm, no  murmurs, rubs or gallops, PMI not laterally displaced Extremities- no clubbing, cyanosis, or edema Skin - no rash or ecchymosis noted   EKG today demonstrates  Vent. rate 84 BPM PR interval 196 ms QRS duration 88 ms QT/QTcB 362/427 ms P-R-T axes 36 22 128 Sinus rhythm with marked sinus arrhythmia Low voltage QRS Cannot rule out Anterior infarct (cited on or before 19-Nov-2023) Abnormal ECG When compared with ECG of 19-Nov-2023 08:50, No significant change was found  Echo 05/05/22 demonstrated   1. S/P AVR with mild AI and normal mean gradient of 15 mmHg).   2. Left ventricular ejection fraction, by estimation, is 55 to 60%. The  left ventricle has normal function. The left ventricle has no regional  wall motion abnormalities. There is mild concentric left ventricular  hypertrophy. Left ventricular diastolic  parameters are indeterminate.   3. Right ventricular systolic function is normal. The right ventricular  size is normal. Tricuspid regurgitation signal is inadequate for assessing  PA pressure.   4. The mitral valve is normal in structure. Mild mitral valve  regurgitation. No evidence of mitral stenosis.   5. The aortic valve has been repaired/replaced. Aortic valve  regurgitation is mild. No aortic stenosis is present. There is a 23 mm  Edwards bioprosthetic valve present in the aortic position.   6. Aortic dilatation noted. There is moderate dilatation of the aortic  root and of the ascending aorta, measuring 45 mm.   7. The inferior vena cava is normal in size with greater than 50%  respiratory variability, suggesting right atrial pressure of 3 mmHg.   CHA2DS2-VASc Score = 6  The patient's score is based upon: CHF History: 0 HTN History: 1 Diabetes History: 1 Stroke History: 2 Vascular Disease History: 1 Age Score: 1 Gender Score: 0       ASSESSMENT AND PLAN: Paroxysmal Atrial Fibrillation (ICD10:  I48.0) The patient's CHA2DS2-VASc score is 6, indicating a  9.7% annual risk of stroke.    Patient is currently in NSR. Continue Lopressor  25 mg BID.   Secondary Hypercoagulable State (ICD10:  D68.69) The patient is at significant risk for stroke/thromboembolism based upon his CHA2DS2-VASc Score of 6.  Continue Apixaban  (Eliquis ).  No bleeding issues on Eliquis .     Follow up 1 year Afib clinic.    Terra Pac, PA-C  [facility] [address] [place], [place] [address] [phone]

## 2024-06-02 ENCOUNTER — Encounter: Payer: Self-pay | Admitting: Radiology

## 2024-06-02 DIAGNOSIS — R300 Dysuria: Secondary | ICD-10-CM | POA: Diagnosis not present

## 2024-06-03 NOTE — Progress Notes (Deleted)
 Office Visit Note  Patient: [name]             Date of Birth: 27-May-1951           MRN: [id number]             PCP: Rolinda Millman, MD Referring: Rolinda Millman, MD Visit Date: 06/16/2024   Subjective:  No chief complaint on file.   History of Present Illness: [name] is a 73 y.o. male here for follow up with osteoarthritis and gout and psoriatic arthritis who presents with a recent flare of joint swelling and pain.   Previous HPI 03/12/2024 [name] is a 73 year old male with osteoarthritis and gout and psoriatic arthritis who presents with a recent flare of joint swelling and pain.   Approximately three to four weeks ago, he experienced a flare of joint swelling and pain, initially localized to his right second toe and later spreading to the small toe. The entire toe became swollen and red, with the redness moving over time. A five-day course of prednisone  did not improve the condition, but a subsequent twelve-day regimen of prednisone  and an unspecified antibiotic seemed to resolve the issue. The pain was so sensitive to touch that he could not tolerate a sheet over the affected area. This episode felt typical of previous gout flares, although it had been a while since his last flare. He reports no recent changes in activity or diet that could have provoked the flare, but acknowledges possibly not drinking enough fluids and having consumed red meat, which he does not typically eat.   He has a history of psoriatic arthritis but reports no recent skin flare-ups or rashes. He experiences joint pain and stiffness, attributing it to aging. He has been seen by an orthopedic specialist for left shoulder pain, which was treated with an injection, providing some relief. He is not a candidate for surgery due to a history of stroke and current use of blood thinners.   He reports a history of triggering in his hands, which has not been bothersome recently. He  mentions nodules on his fingers. He has been on medication for psoriatic arthritis in the past.   In the past, alcohol consumption seemed to trigger gout flares, but he has since stopped drinking. He notes changes in his eating habits following a stroke, leading to weight gain, which he attributes to prednisone  use.         Previous HPI 09/12/23 [name] is a 73 year old male with history of psoriatic arthritis and psoriasis currently monitoring off DMARDs.  He has not experienced any severe flareup with pain or swelling in his joints previously had wrist pain attributed.  He has not seen any recurrence of skin rashes.   He has persistent left shoulder pain and stiffness with his known arthritis and rotator cuff arthropathy.  Had repeat steroid injection with Dr. Brion office in November but did not get significant relief with this like he has had in the past.  Is currently waiting on possibility of surgery till he is further out from previous stroke event and need for anticoagulation.   He experiences intermittent trigger finger in the left hand, with soreness along the tendon. The symptoms are not severe enough to warrant intervention at this time, and he manages with stretching exercises.  He was having triggering of the right index finger but this is already improved on its own.   He reports  numbness in the right hand, affecting the first three fingers, which sometimes occurs in the left hand as well. This numbness can happen while driving or at night and has been ongoing for over a year, predating a stroke he had last year. He denies any known history of carpal tunnel syndrome but acknowledges using a computer, which could contribute to the symptoms.   He has not been on antibiotics recently, except for dental procedures, and has not been sick otherwise.      Previous HPI 06/11/23 [name] is a 73 y.o. male here to establish care for psoriatic arthritis.  His medical  history significant for CAD, stroke, aortic valve replacement, and paroxysmal A-fib on long-term anticoagulation. Also has chronic osteoarthritis, gout, and injury of multiple joints with previous arthroscopic repair of left shoulder rotator cuff and left knee meniscus.  He sees orthopedic surgery for this with viscosupplementation injections for his knees by Dr. Jerri and shoulder with Dr. Eldonna.  He is transferring care from Dr. Lamar Siad in [place] to reduce his commutes. Psoriasis started since his teenage years with chronic longstanding symptoms.  Describes psoriatic plaques involving his scalp, arms, and bilateral knees treated primarily with topical steroids over time.  He was never on systemic treatment until starting Humira which was about 6 years ago.  At the time increase in joint pain and stiffness in multiple areas and extent of skin rashes.  He had occasional joint swelling but more often pain and stiffness without much visible change.  He was also receiving steroid injection for trigger finger involving the right index finger and left ring finger.  He came off the Humira since 3 to 4 months ago due to transitioning care so far he has not noticed any appreciable change in symptoms.  His shoulder pain and knee pains are still present but feels manageable with his current injections about every 6 months and takes Tylenol  as needed.     No Rheumatology ROS completed.   PMFS History:  Patient Active Problem List   Diagnosis Date Noted   Change in bowel habits 04/05/2024   Steatorrhea 04/05/2024   Queasiness 04/05/2024   Pancreatic cyst 04/05/2024   OSA on CPAP 02/07/2024   Finger numbness 09/12/2023   Psoriatic arthritis (HCC) 06/11/2023   Trigger finger, left ring finger 06/11/2023   Trigger finger, right index finger 06/11/2023   Hypercoagulable state due to paroxysmal atrial fibrillation (HCC) 04/20/2023   Thoracic aortic aneurysm 05/09/2022   Abnormal MRI, neck 11/16/2021    Rotator cuff arthropathy of left shoulder 08/17/2021   Chronic left shoulder pain 08/17/2021   Chronic frontal sinusitis 07/07/2021   Facial paresthesia 07/07/2021   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 07/07/2021   Cervical stenosis of spinal canal 06/07/2021   Meningeal irritation 06/07/2021   Idiopathic intracranial hypotension 06/07/2021   Cervicalgia 03/07/2021   Chronic daily headache 03/07/2021   Migraine 01/10/2021   Obstructive sleep apnea 01/10/2021   Renal stones 10/04/2020   Type 2 diabetes mellitus with unspecified complications (HCC) 10/04/2020   Benign prostatic hyperplasia without lower urinary tract symptoms 09/30/2020   GERD (gastroesophageal reflux disease) 05/05/2020   Irritable bowel syndrome 02/19/2020   Dependence on other enabling machines and devices 02/19/2020   ED (erectile dysfunction) of organic origin 02/19/2020   Major depression single episode, in partial remission 02/19/2020   Obesity 02/19/2020   Chronic right-sided low back pain without sciatica 12/16/2019   Primary osteoarthritis of left knee 09/16/2019  Rotator cuff tear arthropathy of left shoulder 09/16/2019   Primary osteoarthritis of right knee 09/16/2019   Complex tear of medial meniscus of right knee as current injury 03/04/2019   Chronic diastolic heart failure (HCC) 11/25/2018   Bruxism (teeth grinding) 11/25/2018   Cephalalgia 11/25/2018   Cerebrovascular disease 11/25/2018   Morning headache 11/25/2018   Sleep related headaches 09/26/2018   Chronic idiopathic gout without tophus 02/13/2018   Hypertension    History of adenomatous polyp of colon 03/14/2016   Thrombosed hemorrhoids 03/01/2016   Encounter for long-term (current) use of high-risk medication 06/30/2015   Psoriasis 12/25/2014   Major depressive disorder, recurrent, in full remission 06/22/2014   Gout 04/21/2014   Chest pain, somewhat atypical 12/28/2013   Dyspnea on exertion 12/27/2011    Dizziness 08/15/2011   Heart valve replaced by other means 07/28/2011   Paroxysmal atrial fibrillation (HCC) 07/28/2011   Aortic stenosis, severe 07/18/2011   Coronary artery disease 07/06/2011   Peripheral vascular disease 07/06/2011   Hyperlipidemia 07/06/2011   Bicuspid aortic valve 07/06/2011    Past Medical History:  Diagnosis Date   Aortic stenosis    a. severe bicuspid AV stenosis s/p pericardial tissue valve 2012.   Arthritis    Coronary artery disease    a. s/p CABG (LIMA-LCx) at time of AVR 2012, mild nonobstructive LAD and RCA stenoses. // Myoview  03/2022: EF 48, no ischemia; low risk   Depression    Depression    Diabetes mellitus without complication [facility])    ED (erectile dysfunction)    Encounter for long-term (current) use of high-risk medication 06/30/2015   Fatty liver    Femoral-popliteal bypass graft occlusion, right 1997   Dr. 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   Obesity    Obstructive sleep apnea 01/10/2021   PAD (peripheral artery disease)    PONV (postoperative nausea and vomiting)    PVD (peripheral vascular disease)    Stroke (HCC) 03/28/2023   Thoracic aortic aneurysm 05/09/2022   Echocardiogram 10/23: EF 55-60, no RWMA, mild LVH, normal RVSF, mild MR, s/p AVR with mild AI and normal gradient (15 mmHg), mod dilation of aortic root and ascending aorta (45 mm).  Chest CT 10/23: Ascending thoracic aorta 4.1 cm; aortic atherosclerosis    Family History  Problem Relation Age of Onset   Hypertension Father    Diabetes Father    Lung cancer Father    Breast cancer Sister    Pancreatic cancer Brother    Heart disease  Maternal Grandmother    Cancer Maternal Grandfather    Liver disease Neg Hx    Colon cancer Neg Hx    Esophageal cancer Neg Hx    Rectal cancer Neg Hx    Stomach cancer Neg Hx    Inflammatory bowel disease Neg Hx    Past Surgical History:  Procedure Laterality Date   AORTIC VALVE REPLACEMENT  07/18/2011   Procedure: AORTIC VALVE REPLACEMENT (AVR);  Surgeon: Dallas KATHEE Jude, MD;  Location: [facility] OR;  Service: Open Heart Surgery;  Laterality: N/A;   CARDIAC CATHETERIZATION  2009, 07/11/11   Dr. Dann   CORONARY ARTERY BYPASS GRAFT  07/18/2011   Procedure: CORONARY ARTERY BYPASS GRAFTING (CABG);  Surgeon: Dallas KATHEE Jude, MD;  Location[facility] OR;  Service: Open Heart Surgery;  Laterality: N/A;  times one to mammary artery, left   fem stent Right 2017   FEMORAL ARTERY - POPLITEAL ARTERY BYPASS GRAFT     KNEE ARTHROSCOPY     left   MRI     to visualize aortic valve   PERIPHERAL VASCULAR CATHETERIZATION N/A 07/12/2016   Procedure: Abdominal Aortogram w/Lower Extremity;  Surgeon: Penne Lonni Colorado, MD;  Location: [facility] INVASIVE CV LAB;  Service: Cardiovascular;  Laterality: N/A;   PERIPHERAL VASCULAR CATHETERIZATION Right 07/12/2016   Procedure: Peripheral Vascular Atherectomy;  Surgeon: Penne Lonni Colorado, MD;  Location: [facility] INVASIVE CV LAB;  Service: Cardiovascular;  Laterality: Right;  Anterior Tibial and Popliteal   ROTATOR CUFF REPAIR     Left   US  ECHOCARDIOGRAPHY     Social History   Social History Narrative   Lives at home with friend Heron Epp   Left handed   Caffeine: about 1 cups daily   Immunization History  Administered Date(s) Administered   Fluzone Influenza virus vaccine,trivalent (IIV3), split virus 06/25/2017   Influenza, Seasonal, Injecte, Preservative Fre 07/02/2016   Influenza,inj,quad, With Preservative 06/14/2015, 06/25/2017   Influenza-Unspecified 06/14/2015, 06/25/2017, 08/01/2018, 08/12/2019, 05/31/2020   PFIZER(Purple Top)SARS-COV-2  Vaccination 09/28/2019, 10/28/2019   Pneumococcal Conjugate-13 06/14/2015, 06/25/2017   Pneumococcal Polysaccharide-23 06/25/2017, 06/07/2020   Pneumococcal-Unspecified 06/07/2020   Td 09/25/2003   Tdap 07/01/2015, 08/01/2018     Objective: Vital Signs: There were no vitals taken for this visit.   Physical Exam   Musculoskeletal Exam: ***  CDAI Exam: CDAI Score: -- Patient Global: --; Provider Global: -- Swollen: --; Tender: -- Joint Exam 06/16/2024   No joint exam has been documented for this visit   There is currently no information documented on the homunculus. Go to the Rheumatology activity and complete the homunculus joint exam.  Investigation: No additional findings.  Imaging: CT ANGIO CHEST AORTA W/CM & OR WO/CM Result Date: 05/12/2024 CLINICAL DATA:  Follow-up thoracic aortic aneurysm. EXAM: CT ANGIOGRAPHY CHEST WITH CONTRAST TECHNIQUE: Multidetector CT imaging of the chest was performed using the standard protocol during bolus administration of intravenous contrast. Multiplanar CT image reconstructions and MIPs were obtained to evaluate the vascular anatomy. RADIATION DOSE REDUCTION: This exam was performed according to the departmental dose-optimization program which includes automated exposure control, adjustment of the mA and/or kV according to patient size and/or use of iterative reconstruction technique. CONTRAST:  OMNIPAQUE  IOHEXOL  350 MG/ML SOLN COMPARISON:  05/07/2023 and 11/18/2019 FINDINGS: Cardiovascular: 4.3 cm ascending thoracic aortic aneurysm shows no significant change compared to previous study. No evidence of thoracic aortic dissection. Aortic atherosclerotic calcification noted. Prior CABG and aortic valve replacement again seen. No evidence of pulmonary emboli. Mediastinum/Nodes: No masses or pathologically enlarged lymph nodes identified. Lungs/Pleura: No pulmonary mass, infiltrate, or effusion. Upper abdomen: Stable small hiatal hernia. 1.9 cm  cystic lesion in the pancreatic tail shows mild increase from 1.3 cm on prior study of 11/18/2019. Musculoskeletal: No suspicious bone lesions identified. Review of the MIP images confirms the above findings. IMPRESSION: Stable 4.3 cm ascending thoracic aortic aneurysm. Recommend annual imaging followup by CTA or MRA. This recommendation follows 2010 ACCF/AHA/AATS/ACR/ASA/SCA/SCAI/SIR/STS/SVM Guidelines for the Diagnosis and Management of Patients with Thoracic Aortic Disease. Circulation. 2010; 121: Z733-z630. Aortic aneurysm NOS (  [id number]) Stable small hiatal hernia. Mild increase in size of 1.9 cm cystic lesion in the pancreatic tail since 2021 exam, suspicious for indolent cystic pancreatic neoplasm. Recommend abdomen MRI without and with contrast for further characterization. Electronically Signed   By: Norleen DELENA Kil M.D.   On: 05/12/2024 12:57    Recent Labs: Lab Results  Component Value Date   WBC 6.3 05/18/2023   HGB 15.3 05/18/2023   PLT 159 05/18/2023   NA 142 10/25/2021   K 4.2 10/25/2021   CL 109 (H) 10/25/2021   CO2 18 (L) 10/25/2021   GLUCOSE 99 10/25/2021   BUN 17 10/25/2021   CREATININE 1.30 (H) 05/09/2024   BILITOT 0.5 05/12/2016   ALKPHOS 55 05/12/2016   AST 40 (H) 05/12/2016   ALT 38 05/12/2016   PROT 6.6 05/12/2016   ALBUMIN  3.9 05/12/2016   CALCIUM  9.2 10/25/2021   GFRAA 78 12/15/2016   QFTBGOLDPLUS NEGATIVE 06/11/2023    Speciality Comments: No specialty comments available.  Procedures:  No procedures performed Allergies: Codeine, Clopidogrel , Flexeril [cyclobenzaprine], Sulfamethoxazole-trimethoprim, Morphine  sulfate, and Morphine  and codeine   Assessment / Plan:     Visit Diagnoses: No diagnosis found.  ***  Orders: No orders of the defined types were placed in this encounter.  No orders of the defined types were placed in this encounter.    Follow-Up Instructions: No follow-ups on file.   Sequoya Hogsett M Gaytha Raybourn, CMA  Note - This record has been  created using Animal nutritionist.  Chart creation errors have been sought, but may not always  have been located. Such creation errors do not reflect on  the standard of medical care.

## 2024-06-07 ENCOUNTER — Other Ambulatory Visit (HOSPITAL_COMMUNITY): Payer: Self-pay

## 2024-06-09 ENCOUNTER — Other Ambulatory Visit (HOSPITAL_COMMUNITY): Payer: Self-pay

## 2024-06-09 ENCOUNTER — Other Ambulatory Visit: Payer: Self-pay

## 2024-06-09 DIAGNOSIS — R053 Chronic cough: Secondary | ICD-10-CM | POA: Diagnosis not present

## 2024-06-09 DIAGNOSIS — B349 Viral infection, unspecified: Secondary | ICD-10-CM | POA: Diagnosis not present

## 2024-06-09 MED ORDER — MOLNUPIRAVIR 200 MG PO CAPS
4.0000 | ORAL_CAPSULE | Freq: Two times a day (BID) | ORAL | 0 refills | Status: AC
  Filled 2024-06-09: qty 40, 5d supply, fill #0

## 2024-06-16 ENCOUNTER — Ambulatory Visit: Admitting: Internal Medicine

## 2024-06-16 DIAGNOSIS — L4050 Arthropathic psoriasis, unspecified: Secondary | ICD-10-CM

## 2024-06-16 DIAGNOSIS — M12812 Other specific arthropathies, not elsewhere classified, left shoulder: Secondary | ICD-10-CM

## 2024-06-16 DIAGNOSIS — M1A9XX0 Chronic gout, unspecified, without tophus (tophi): Secondary | ICD-10-CM

## 2024-07-03 ENCOUNTER — Telehealth: Payer: Self-pay | Admitting: Cardiovascular Disease

## 2024-07-03 NOTE — Telephone Encounter (Signed)
 Pt came into office to drop off ppwk. I placed the ppwk in your mailbox. Thank you.

## 2024-07-08 NOTE — Telephone Encounter (Signed)
 Pt called in asking if paperwork has been completed.

## 2024-07-08 NOTE — Telephone Encounter (Signed)
Perfect!  Thank you so much.

## 2024-07-08 NOTE — Telephone Encounter (Signed)
 Looked in Dr. Margurite mailbox and I do no see a form. There is not a form scanned in media. Dr. Copper, have you seen a form for this patient?

## 2024-07-09 ENCOUNTER — Telehealth: Payer: Self-pay | Admitting: Cardiovascular Disease

## 2024-07-16 DIAGNOSIS — I10 Essential (primary) hypertension: Secondary | ICD-10-CM | POA: Diagnosis not present

## 2024-07-16 DIAGNOSIS — E538 Deficiency of other specified B group vitamins: Secondary | ICD-10-CM | POA: Diagnosis not present

## 2024-07-16 DIAGNOSIS — E1169 Type 2 diabetes mellitus with other specified complication: Secondary | ICD-10-CM | POA: Diagnosis not present

## 2024-07-16 DIAGNOSIS — E559 Vitamin D deficiency, unspecified: Secondary | ICD-10-CM | POA: Diagnosis not present

## 2024-07-16 DIAGNOSIS — Z23 Encounter for immunization: Secondary | ICD-10-CM | POA: Diagnosis not present

## 2024-07-16 DIAGNOSIS — I739 Peripheral vascular disease, unspecified: Secondary | ICD-10-CM | POA: Diagnosis not present

## 2024-07-16 DIAGNOSIS — E782 Mixed hyperlipidemia: Secondary | ICD-10-CM | POA: Diagnosis not present

## 2024-07-27 ENCOUNTER — Other Ambulatory Visit (HOSPITAL_COMMUNITY): Payer: Self-pay

## 2024-07-28 ENCOUNTER — Other Ambulatory Visit: Payer: Self-pay | Admitting: Surgical

## 2024-07-28 ENCOUNTER — Other Ambulatory Visit (HOSPITAL_COMMUNITY): Payer: Self-pay

## 2024-07-31 DEATH — deceased

## 2024-08-02 ENCOUNTER — Other Ambulatory Visit (HOSPITAL_COMMUNITY): Payer: Self-pay

## 2024-08-03 ENCOUNTER — Other Ambulatory Visit (HOSPITAL_COMMUNITY): Payer: Self-pay

## 2024-08-04 ENCOUNTER — Encounter: Payer: Self-pay | Admitting: Sports Medicine

## 2024-08-04 ENCOUNTER — Ambulatory Visit: Admitting: Sports Medicine

## 2024-08-04 ENCOUNTER — Other Ambulatory Visit (HOSPITAL_COMMUNITY): Payer: Self-pay

## 2024-08-04 ENCOUNTER — Other Ambulatory Visit (INDEPENDENT_AMBULATORY_CARE_PROVIDER_SITE_OTHER)

## 2024-08-04 DIAGNOSIS — M19011 Primary osteoarthritis, right shoulder: Secondary | ICD-10-CM | POA: Diagnosis not present

## 2024-08-04 DIAGNOSIS — G8929 Other chronic pain: Secondary | ICD-10-CM

## 2024-08-04 DIAGNOSIS — M25511 Pain in right shoulder: Secondary | ICD-10-CM | POA: Diagnosis not present

## 2024-08-04 DIAGNOSIS — M25512 Pain in left shoulder: Secondary | ICD-10-CM

## 2024-08-04 DIAGNOSIS — M12812 Other specific arthropathies, not elsewhere classified, left shoulder: Secondary | ICD-10-CM

## 2024-08-04 MED ORDER — BUPIVACAINE HCL 0.25 % IJ SOLN
2.0000 mL | INTRAMUSCULAR | Status: AC | PRN
  Administered 2024-08-04: 2 mL via INTRA_ARTICULAR

## 2024-08-04 MED ORDER — DAPAGLIFLOZIN PROPANEDIOL 10 MG PO TABS
10.0000 mg | ORAL_TABLET | Freq: Every day | ORAL | 1 refills | Status: AC
  Filled 2024-08-04: qty 90, 90d supply, fill #0

## 2024-08-04 MED ORDER — METHYLPREDNISOLONE ACETATE 40 MG/ML IJ SUSP
40.0000 mg | INTRAMUSCULAR | Status: AC | PRN
  Administered 2024-08-04: 40 mg via INTRA_ARTICULAR

## 2024-08-04 MED ORDER — LIDOCAINE HCL 1 % IJ SOLN
2.0000 mL | INTRAMUSCULAR | Status: AC | PRN
  Administered 2024-08-04: 2 mL

## 2024-08-04 NOTE — Progress Notes (Signed)
 "  [name] - 74 y.o. male MRN [id number]  Date of birth: 07-09-1951  Office Visit Note: Visit Date: 08/04/2024 PCP: Rolinda Millman, MD Referred by: Rolinda Millman, MD  Subjective: Chief Complaint  Patient presents with   Right Shoulder - Pain   Left Shoulder - Pain   HPI: [name] is a pleasant 74 y.o. male who presents today for acute on chronic bilateral shoulder pain. He is LHD.  [name] is having L > R bilateral shoulder pain. Left shoulder has always been worse. He states that he got 90-95% relief from the injections that he got in October. He would like to repeat these injections today. He denies any new injury or symptoms for either of the shoulders. He did get about 2 months of good relief before his pain began to return. He does have more pain in the left shoulder. Takes Tramadol  50mg  for breakthrough pain, occassional Tylenol .  Cannot take NSAIDs given Eliquis  use.  He also mentions occasionally he will have pain on the top of the right shoulder that radiates into the neck.  This does not happen during the day but when he is sleeping at nighttime, he is a side sleeper.  He is a type-II diabetic, but well-controlled. Managed on Farxiga  10mg  every day. Lab Results  Component Value Date   HGBA1C 5.8 (H) 07/14/2011   Pertinent ROS were reviewed with the patient and found to be negative unless otherwise specified above in HPI.   Assessment & Plan: Visit Diagnoses:  1. Chronic pain of both shoulders   2. Rotator cuff arthropathy of left shoulder   3. Arthritis of right acromioclavicular joint    Plan: Impression is acute exacerbation of chronic bilateral shoulder pain, left shoulder > right.  Based on his high riding humeral head on x-ray as well as physical exam, likely has large/full-thickness rotator cuff tearing of the left shoulder.  Has been considered for surgical intervention in the past but had other health comorbidities that prohibited that at this time.  He  is okay with continuing conservative treatment and not surgical intervention at this time given that injections and conservative treatment have been helpful.  He does have some more irritation of the right rotator cuff although no gross weakness, does have mild to moderate AC joint arthritis.  We did proceed with bilateral left and right shoulder subacromial joint injection, patient tolerated well.  Advised on postinjection protocol.  He may use Tylenol  and/or tramadol  50 mg for breakthrough pain only.  He is setting up Silver sneakers through Medicare to become more active again, I did encourage this as well for the body and shoulders in general.  I did discuss with him that his AC pain that slightly radiates into the neck could be coming from the arthritis itself versus further evaluating the neck.  He will pay attention to this over the next 3-4 weeks and if his pain does not improve he will let us  know and we will follow-up with additional workup.  Otherwise, he may continue to follow-up with us  and/or Dr. Addie for his shoulders.  Follow-up: Return in about 1 month (around 09/04/2024), or if symptoms worsen or fail to improve.   Meds & Orders: No orders of the defined types were placed in this encounter.   Orders Placed This Encounter  Procedures   Large Joint Inj: R subacromial bursa   Large Joint Inj: L subacromial bursa   XR Shoulder Right     Procedures: Large  Joint Inj: R subacromial bursa on 08/04/2024 8:34 AM Indications: pain Details: 22 G 1.5 in needle, posterior approach Medications: 2 mL lidocaine  1 %; 2 mL bupivacaine  0.25 %; 40 mg methylPREDNISolone  acetate 40 MG/ML Outcome: tolerated well, no immediate complications  Subacromial Joint Injection, Right Shoulder After discussion on risks/benefits/indications, informed verbal consent was obtained. A timeout was then performed. Patient was seated on table in exam room. The patient's shoulder was prepped with betadine  and alcohol  swabs and utilizing posterior approach a 22G, 1.5 needle was directed anteriorly and laterally into the patient's subacromial space was injected with 2:2:1 mixture of lidocaine :bupivicaine:depomedrol with appreciation of free-flowing of the injectate into the bursal space. Patient tolerated the procedure well without immediate complications.   Procedure, treatment alternatives, risks and benefits explained, specific risks discussed. Consent was given by the patient. Immediately prior to procedure a time out was called to verify the correct patient, procedure, equipment, support staff and site/side marked as required. Patient was prepped and draped in the usual sterile fashion.    Large Joint Inj: L subacromial bursa on 08/04/2024 8:38 AM Indications: pain Details: 22 G 1.5 in needle, posterior approach Medications: 2 mL lidocaine  1 %; 2 mL bupivacaine  0.25 %; 40 mg methylPREDNISolone  acetate 40 MG/ML Outcome: tolerated well, no immediate complications  Subacromial Joint Injection, Left Shoulder After discussion on risks/benefits/indications, informed verbal consent was obtained. A timeout was then performed. Patient was seated on table in exam room. The patient's shoulder was prepped with betadine  and alcohol swabs and utilizing posterior approach a 22G, 1.5 needle was directed anteriorly and laterally into the patient's subacromial space was injected with 2:2:1 mixture of lidocaine :bupivicaine:depomedrol with appreciation of free-flowing of the injectate into the bursal space. Patient tolerated the procedure well without immediate complications.   Procedure, treatment alternatives, risks and benefits explained, specific risks discussed. Consent was given by the patient. Immediately prior to procedure a time out was called to verify the correct patient, procedure, equipment, support staff and site/side marked as required. Patient was prepped and draped in the usual sterile fashion.           Clinical History: No specialty comments available.  He reports that he has never smoked. He has never been exposed to tobacco smoke. He has never used smokeless tobacco.  Recent Labs    03/12/24 0822  LABURIC 5.3    Objective:    Physical Exam  Gen: Well-appearing, in no acute distress; non-toxic CV: Well-perfused. Warm.  Resp: Breathing unlabored on room air; no wheezing. Psych: Fluid speech in conversation; appropriate affect; normal thought process  Ortho Exam - Bilateral shoulders: No redness swelling or effusion.  There is full and active range of motion of the right shoulder.  There is significant restriction with flexion and abduction actively of the left shoulder but able to be taken to near full range of motion passively.  Mild TTP and crossarm adduction test over the right AC joint.  Positive drop arm test left.  Weakness with resisted abduction and ER.  Imaging:  XR Shoulder Right 4 view x-ray of the right shoulder including AP, Grashey, scapular Y and  axial view was ordered and reviewed by myself today.  X-rays demonstrate  mild glenohumeral joint arthritic change with mild to moderate AC joint  arthralgia.  Inferior acromial spurring.  Humeral head well-seated within  the acetabulum.  No acute fracture or otherwise acute bony abnormality  noted.   *Independent review and interpretation left shoulder x-ray from 07/13/2023 was performed  by myself today.  AP superior migration of the humeral head on the left.  Mild glenohumeral joint arthritic change although no advanced arthropathy.  No acute fracture or acute bony abnormality otherwise noted.  Past Medical/Family/Surgical/Social History: Medications & Allergies reviewed per EMR, new medications updated. Patient Active Problem List   Diagnosis Date Noted   Change in bowel habits 04/05/2024   Steatorrhea 04/05/2024   Queasiness 04/05/2024   Pancreatic cyst 04/05/2024   OSA on CPAP 02/07/2024   Finger numbness  09/12/2023   Psoriatic arthritis (HCC) 06/11/2023   Trigger finger, left ring finger 06/11/2023   Trigger finger, right index finger 06/11/2023   Hypercoagulable state due to paroxysmal atrial fibrillation (HCC) 04/20/2023   Thoracic aortic aneurysm 05/09/2022   Abnormal MRI, neck 11/16/2021   Rotator cuff arthropathy of left shoulder 08/17/2021   Chronic left shoulder pain 08/17/2021   Chronic frontal sinusitis 07/07/2021   Facial paresthesia 07/07/2021   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 07/07/2021   Cervical stenosis of spinal canal 06/07/2021   Meningeal irritation 06/07/2021   Idiopathic intracranial hypotension 06/07/2021   Cervicalgia 03/07/2021   Chronic daily headache 03/07/2021   Migraine 01/10/2021   Obstructive sleep apnea 01/10/2021   Renal stones 10/04/2020   Type 2 diabetes mellitus with unspecified complications (HCC) 10/04/2020   Benign prostatic hyperplasia without lower urinary tract symptoms 09/30/2020   GERD (gastroesophageal reflux disease) 05/05/2020   Irritable bowel syndrome 02/19/2020   Dependence on other enabling machines and devices 02/19/2020   ED (erectile dysfunction) of organic origin 02/19/2020   Major depression single episode, in partial remission 02/19/2020   Obesity 02/19/2020   Chronic right-sided low back pain without sciatica 12/16/2019   Primary osteoarthritis of left knee 09/16/2019   Rotator cuff tear arthropathy of left shoulder 09/16/2019   Primary osteoarthritis of right knee 09/16/2019   Complex tear of medial meniscus of right knee as current injury 03/04/2019   Chronic diastolic heart failure (HCC) 11/25/2018   Bruxism (teeth grinding) 11/25/2018   Cephalalgia 11/25/2018   Cerebrovascular disease 11/25/2018   Morning headache 11/25/2018   Sleep related headaches 09/26/2018   Chronic idiopathic gout without tophus 02/13/2018   Hypertension    History of adenomatous polyp of colon 03/14/2016    Thrombosed hemorrhoids 03/01/2016   Encounter for long-term (current) use of high-risk medication 06/30/2015   Psoriasis 12/25/2014   Major depressive disorder, recurrent, in full remission 06/22/2014   Gout 04/21/2014   Chest pain, somewhat atypical 12/28/2013   Dyspnea on exertion 12/27/2011   Dizziness 08/15/2011   Heart valve replaced by other means 07/28/2011   Paroxysmal atrial fibrillation (HCC) 07/28/2011   Aortic stenosis, severe 07/18/2011   Coronary artery disease 07/06/2011   Peripheral vascular disease 07/06/2011   Hyperlipidemia 07/06/2011   Bicuspid aortic valve 07/06/2011   Past Medical History:  Diagnosis Date   Aortic stenosis    a. severe bicuspid AV stenosis s/p pericardial tissue valve 2012.   Arthritis    Coronary artery disease    a. s/p CABG (LIMA-LCx) at time of AVR 2012, mild nonobstructive LAD and RCA stenoses. // Myoview  03/2022: EF 48, no ischemia; low risk   Depression    Depression    Diabetes mellitus without complication [facility])    ED (erectile dysfunction)    Encounter for long-term (current) use of high-risk medication 06/30/2015   Fatty liver    Femoral-popliteal bypass graft occlusion, right 1997   Dr. 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   Obesity    Obstructive sleep apnea 01/10/2021   PAD (peripheral artery disease)    PONV (postoperative nausea and vomiting)    PVD (peripheral vascular disease)    Stroke (HCC) 03/28/2023   Thoracic aortic aneurysm 05/09/2022   Echocardiogram 10/23: EF 55-60, no RWMA, mild LVH, normal RVSF, mild MR, s/p AVR with mild AI and normal gradient (15 mmHg), mod dilation of  aortic root and ascending aorta (45 mm).  Chest CT 10/23: Ascending thoracic aorta 4.1 cm; aortic atherosclerosis   Family History  Problem Relation Age of Onset   Hypertension Father    Diabetes Father    Lung cancer Father    Breast cancer Sister    Pancreatic cancer Brother    Heart disease Maternal Grandmother    Cancer Maternal Grandfather    Liver disease Neg Hx    Colon cancer Neg Hx    Esophageal cancer Neg Hx    Rectal cancer Neg Hx    Stomach cancer Neg Hx    Inflammatory bowel disease Neg Hx    Past Surgical History:  Procedure Laterality Date   AORTIC VALVE REPLACEMENT  07/18/2011   Procedure: AORTIC VALVE REPLACEMENT (AVR);  Surgeon: Dallas KATHEE Jude, MD;  Location[facility] OR;  Service: Open Heart Surgery;  Laterality: N/A;   CARDIAC CATHETERIZATION  2009, 07/11/11   Dr. Dann   CORONARY ARTERY BYPASS GRAFT  07/18/2011   Procedure: CORONARY ARTERY BYPASS GRAFTING (CABG);  Surgeon: Dallas KATHEE Jude, MD;  Location: [facility] OR;  Service: Open Heart Surgery;  Laterality: N/A;  times one to mammary artery, left   fem stent Right 2017   FEMORAL ARTERY - POPLITEAL ARTERY BYPASS GRAFT     KNEE ARTHROSCOPY     left   MRI     to visualize aortic valve   PERIPHERAL VASCULAR CATHETERIZATION N/A 07/12/2016   Procedure: Abdominal Aortogram w/Lower Extremity;  Surgeon: Penne Lonni Colorado, MD;  Location: [facility] INVASIVE CV LAB;  Service: Cardiovascular;  Laterality: N/A;   PERIPHERAL VASCULAR CATHETERIZATION Right 07/12/2016   Procedure: Peripheral Vascular Atherectomy;  Surgeon: Penne Lonni Colorado, MD;  Location: [facility] INVASIVE CV LAB;  Service: Cardiovascular;  Laterality: Right;  Anterior Tibial and Popliteal   ROTATOR CUFF REPAIR     Left   US  ECHOCARDIOGRAPHY     Social History   Occupational History   Occupation: Advice Worker: [employer]   Tobacco Use   Smoking status: Never    Passive exposure: Never   Smokeless tobacco: Never   Tobacco comments:     Never smoked 05/18/23  Vaping Use   Vaping status: Never Used  Substance and Sexual Activity   Alcohol use: No   Drug use: No   Sexual activity: Not on file   "

## 2024-08-04 NOTE — Progress Notes (Signed)
 Patient says that he got 90-95% relief from the injections that he got in October. He would like to repeat these injections today. He denies any new injury or symptoms for either of the shoulders. He did get about 2 months of good relief before his pain began to return. He does have more pain in the left shoulder, and is left-hand dominant.

## 2024-08-10 ENCOUNTER — Other Ambulatory Visit (HOSPITAL_COMMUNITY): Payer: Self-pay

## 2024-09-09 ENCOUNTER — Ambulatory Visit (HOSPITAL_COMMUNITY)
